# Patient Record
Sex: Male | Born: 1995 | Race: Black or African American | Hispanic: No | Marital: Single | Smoking: Never smoker
Health system: Southern US, Community
[De-identification: ages and names within clinical notes are randomized; demographics above are authoritative.]

## PROBLEM LIST (undated history)

## (undated) DIAGNOSIS — D571 Sickle-cell disease without crisis: Secondary | ICD-10-CM

## (undated) HISTORY — PX: WISDOM TOOTH EXTRACTION: SHX21

---
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
## 2014-03-24 ENCOUNTER — Encounter (HOSPITAL_COMMUNITY): Payer: Self-pay | Admitting: Emergency Medicine

## 2014-03-24 ENCOUNTER — Emergency Department (HOSPITAL_COMMUNITY): Payer: BC Managed Care – PPO

## 2014-03-24 ENCOUNTER — Emergency Department (HOSPITAL_COMMUNITY)
Admission: EM | Admit: 2014-03-24 | Discharge: 2014-03-24 | Disposition: A | Discharge status: Home / Self Care | Payer: BC Managed Care – PPO | Attending: Emergency Medicine | Admitting: Emergency Medicine

## 2014-03-24 DIAGNOSIS — D5700 Hb-SS disease with crisis, unspecified: Secondary | ICD-10-CM | POA: Insufficient documentation

## 2014-03-24 DIAGNOSIS — J3489 Other specified disorders of nose and nasal sinuses: Secondary | ICD-10-CM | POA: Diagnosis present

## 2014-03-24 DIAGNOSIS — J069 Acute upper respiratory infection, unspecified: Secondary | ICD-10-CM | POA: Diagnosis not present

## 2014-03-24 LAB — COMPREHENSIVE METABOLIC PANEL
ALT: 33 U/L (ref 0–53)
AST: 45 U/L — ABNORMAL HIGH (ref 0–37)
Albumin: 4.2 g/dL (ref 3.5–5.2)
Alkaline Phosphatase: 97 U/L (ref 39–117)
Anion gap: 13 (ref 5–15)
BILIRUBIN TOTAL: 3.7 mg/dL — AB (ref 0.3–1.2)
BUN: 5 mg/dL — ABNORMAL LOW (ref 6–23)
CHLORIDE: 101 meq/L (ref 96–112)
CO2: 24 meq/L (ref 19–32)
CREATININE: 0.61 mg/dL (ref 0.50–1.35)
Calcium: 9.1 mg/dL (ref 8.4–10.5)
Glucose, Bld: 103 mg/dL — ABNORMAL HIGH (ref 70–99)
Potassium: 4 mEq/L (ref 3.7–5.3)
Sodium: 138 mEq/L (ref 137–147)
Total Protein: 7.3 g/dL (ref 6.0–8.3)

## 2014-03-24 LAB — CBC WITH DIFFERENTIAL/PLATELET
Basophils Absolute: 0 10*3/uL (ref 0.0–0.1)
Basophils Relative: 0 % (ref 0–1)
EOS PCT: 4 % (ref 0–5)
Eosinophils Absolute: 0.7 10*3/uL (ref 0.0–0.7)
HEMATOCRIT: 20.7 % — AB (ref 39.0–52.0)
HEMOGLOBIN: 7.3 g/dL — AB (ref 13.0–17.0)
LYMPHS ABS: 1.2 10*3/uL (ref 0.7–4.0)
Lymphocytes Relative: 7 % — ABNORMAL LOW (ref 12–46)
MCH: 28.3 pg (ref 26.0–34.0)
MCHC: 35.3 g/dL (ref 30.0–36.0)
MCV: 80.2 fL (ref 78.0–100.0)
MONO ABS: 1.6 10*3/uL — AB (ref 0.1–1.0)
MONOS PCT: 9 % (ref 3–12)
Neutro Abs: 14.1 10*3/uL — ABNORMAL HIGH (ref 1.7–7.7)
Neutrophils Relative %: 80 % — ABNORMAL HIGH (ref 43–77)
Platelets: 475 10*3/uL — ABNORMAL HIGH (ref 150–400)
RBC: 2.58 MIL/uL — AB (ref 4.22–5.81)
RDW: 26.2 % — ABNORMAL HIGH (ref 11.5–15.5)
WBC: 17.6 10*3/uL — ABNORMAL HIGH (ref 4.0–10.5)

## 2014-03-24 LAB — RETICULOCYTES
RBC.: 2.58 MIL/uL — AB (ref 4.22–5.81)
RETIC CT PCT: 19.5 % — AB (ref 0.4–3.1)
Retic Count, Absolute: 503.1 10*3/uL — ABNORMAL HIGH (ref 19.0–186.0)

## 2014-03-24 MED ORDER — SODIUM CHLORIDE 0.9 % IV BOLUS (SEPSIS)
1000.0000 mL | Freq: Once | INTRAVENOUS | Status: AC
  Administered 2014-03-24: 1000 mL via INTRAVENOUS

## 2014-03-24 MED ORDER — OXYMETAZOLINE HCL 0.05 % NA SOLN
1.0000 | Freq: Once | NASAL | Status: AC
  Administered 2014-03-24: 1 via NASAL
  Filled 2014-03-24: qty 15

## 2014-03-24 MED ORDER — OXYCODONE-ACETAMINOPHEN 5-325 MG PO TABS: 2.0000 | ORAL_TABLET | ORAL | Status: DC | PRN

## 2014-03-24 MED ORDER — HYDROMORPHONE HCL 1 MG/ML IJ SOLN
1.0000 mg | Freq: Once | INTRAMUSCULAR | Status: AC
  Administered 2014-03-24: 1 mg via INTRAVENOUS
  Filled 2014-03-24: qty 1

## 2014-03-24 NOTE — ED Notes (Signed)
Pt states he developed a cold on Friday with nasal congestion, sinus pressure and generalized weakness. States last night he started to experience sickle cell pain in left shoulder and lower back. States he took 10 mg oxycodone and ibuprofen with no relief. Pt denies any CP, denies SOB. NAD.

## 2014-03-24 NOTE — ED Provider Notes (Signed)
CSN: [id number]     Arrival date & time 03/24/14  1315 History   First MD Initiated Contact with Patient 03/24/14 1347     Chief Complaint  Patient presents with  . Sickle Cell Pain Crisis  . Nasal Congestion     (Consider location/radiation/quality/duration/timing/severity/associated sxs/prior Treatment) HPI Matthew Case is a 18 y.o. male with PMH of sickle cell disease presenting with 3 day history of sinus congestion and pressure and last night developed left shoulder and right sided low back pain. Pain is sharp and pain is typical of sickle cell crises. Patient took 10 mg oxycodone and ibuprofen without relief yesterday. Pain is worse with movement. Last hospitalization for sickle cell was in July. Patient is afebrile in the ED today. He has had a productive cough with yellow thin mucous. Patient denies chest pain, SOB, difficulty breathing.   Past Medical History  Diagnosis Date  . Sickle cell disease    Past Surgical History  Procedure Laterality Date  . Cholecystectomy N/A 07/27/2013    Procedure: LAPAROSCOPIC CHOLECYSTECTOMY ;  Surgeon: Shelly Rubenstein, MD;  Location: [facility] OR;  Service: General;  Laterality: N/A;   Family History  Problem Relation Age of Onset  . Diabetes Maternal Grandmother   . Hypertension Maternal Grandmother   . Hypertension Maternal Grandfather   . Diabetes Maternal Grandfather    History  Substance Use Topics  . Smoking status: Never Smoker   . Smokeless tobacco: Never Used  . Alcohol Use: No    Review of Systems  Constitutional: Negative for fever and chills.  HENT: Positive for congestion and rhinorrhea.   Eyes: Negative for visual disturbance.  Respiratory: Positive for cough. Negative for shortness of breath.   Cardiovascular: Negative for chest pain and palpitations.  Gastrointestinal: Negative for nausea, vomiting and diarrhea.  Genitourinary: Negative for dysuria and hematuria.  Musculoskeletal: Positive for back pain. Negative for  gait problem.  Skin: Negative for rash.  Neurological: Negative for weakness and headaches.      Allergies  Review of patient's allergies indicates no known allergies.  Home Medications   Prior to Admission medications   Medication Sig Start Date End Date Taking? Authorizing Provider  ibuprofen (ADVIL,MOTRIN) 600 MG tablet Take 600 mg by mouth every 6 (six) hours as needed for mild pain.    Yes Historical Provider, MD  Oxycodone HCl 10 MG TABS Take 1 tablet (10 mg total) by mouth 4 (four) times daily as needed (moderate or severe pain). 09/27/13  Yes Flint Melter, MD  oxyCODONE-acetaminophen (PERCOCET/ROXICET) 5-325 MG per tablet Take 2 tablets by mouth every 4 (four) hours as needed for moderate pain or severe pain. 03/24/14   Benetta Spar L Maury Bamba, PA-C   BP 122/65  Pulse 90  Temp(Src) 98.6 F (37 C) (Oral)  Resp 19  Ht 6' (1.829 m)  Wt 136 lb (61.689 kg)  BMI 18.44 kg/m2  SpO2 92% Physical Exam  Nursing note and vitals reviewed. Constitutional: He appears well-developed and well-nourished. No distress.  HENT:  Head: Normocephalic and atraumatic.  Mouth/Throat: Mucous membranes are normal. Posterior oropharyngeal edema and posterior oropharyngeal erythema present. No oropharyngeal exudate.  Eyes: Conjunctivae and EOM are normal. Right eye exhibits no discharge. Left eye exhibits no discharge.  Cardiovascular: Normal rate, regular rhythm and normal heart sounds.   Pulmonary/Chest: Effort normal and breath sounds normal. No respiratory distress. He has no wheezes.  Abdominal: Soft. Bowel sounds are normal. He exhibits no distension. There is no tenderness.  Musculoskeletal:  FROM of right shoulder with mild tenderness. Neurovascularly intact.  No midline back tenderness, step off or crepitus. Right  sided lower back tenderness. No CVA tenderness. No point tenderness in remainder of arms or legs.   Neurological: He is alert. He exhibits normal muscle tone. Coordination normal.   Equal muscle tone. 5/5 strength in lower extremities.  Normal gait.   Skin: Skin is warm and dry. He is not diaphoretic.    ED Course  Procedures (including critical care time) Labs Review Labs Reviewed  CBC WITH DIFFERENTIAL - Abnormal; Notable for the following:    WBC 17.6 (*)    RBC 2.58 (*)    Hemoglobin 7.3 (*)    HCT 20.7 (*)    RDW 26.2 (*)    Platelets 475 (*)    Neutrophils Relative % 80 (*)    Lymphocytes Relative 7 (*)    Neutro Abs 14.1 (*)    Monocytes Absolute 1.6 (*)    All other components within normal limits  COMPREHENSIVE METABOLIC PANEL - Abnormal; Notable for the following:    Glucose, Bld 103 (*)    BUN 5 (*)    AST 45 (*)    Total Bilirubin 3.7 (*)    All other components within normal limits  RETICULOCYTES - Abnormal; Notable for the following:    Retic Ct Pct 19.5 (*)    RBC. 2.58 (*)    Retic Count, Manual 503.1 (*)    All other components within normal limits    Imaging Review Dg Chest 2 View  03/24/2014   CLINICAL DATA:  Cough, congestion, sickle cell pain  EXAM: CHEST  2 VIEW  COMPARISON:  09/27/2013  FINDINGS: Lungs are clear.  No pleural effusion or pneumothorax.  The heart is top-normal in size.  Thoracolumbar spine is notable for superior/inferior endplate vertebral body endplate changes compatible with sickle cell disease.  Cholecystectomy clips.  IMPRESSION: No evidence of acute cardiopulmonary disease.   Electronically Signed   By: Charline Bills M.D.   On: 03/24/2014 15:42     EKG Interpretation None     Meds given in ED:  Medications  HYDROmorphone (DILAUDID) injection 1 mg (1 mg Intravenous Given 03/24/14 1439)  sodium chloride 0.9 % bolus 1,000 mL (0 mLs Intravenous Stopped 03/24/14 1630)  HYDROmorphone (DILAUDID) injection 1 mg (1 mg Intravenous Given 03/24/14 1627)  sodium chloride 0.9 % bolus 1,000 mL (0 mLs Intravenous Stopped 03/24/14 1707)  oxymetazoline (AFRIN) 0.05 % nasal spray 1 spray (1 spray Each Nare Given  03/24/14 1651)    Discharge Medication List as of 03/24/2014  4:46 PM    START taking these medications   Details  oxyCODONE-acetaminophen (PERCOCET/ROXICET) 5-325 MG per tablet Take 2 tablets by mouth every 4 (four) hours as needed for moderate pain or severe pain., Starting 03/24/2014, Until Discontinued, Print          MDM   Final diagnoses:  Sickle cell crisis  URI (upper respiratory infection)   Patient with sickle cell disease presents with typical symptoms of pain crisis unrelieved by home pain meds. Pain in low right back and left shoulder tenderness. No chest pain or shortness of breath. Patient with productive cough. Patient with symptoms of URI. Afebrile with VSS. Patient anemic but at baseline. Elevated reticulocytes. CXR without acute changes. Given 2 bolus of fluids. Pain improved to 5/10 in ED with pain medication. Patient is afebrile, nontoxic, and in no acute distress. Patient is appropriate for outpatient management and is stable  for discharge. Script for pain medication provided. Sedation and driving precautions provided. Symptomatic control for URI symptoms and hydration. Afrin for congestion but not to use more than 3 days to prevent rebound congestion. Follow up with PCP within one week. Pt to return with fevers, CP, SOB, fevers, worsening cough and uncontrollable pain.   Discussed return precautions with patient. Discussed all results and patient verbalizes understanding and agrees with plan.  This is a shared patient. This patient was discussed with the physician, Dr. Effie Shy who saw and evaluated the patient and agrees with the plan.     Louann Sjogren, PA-C 03/24/14 2027

## 2014-03-24 NOTE — Discharge Instructions (Signed)
Return to the emergency room with worsening of symptoms, new symptoms or with symptoms that are concerning, especially fevers, chest pain, cough, nausea, vomiting or severe pain. OTC cold medications, mucinex, afrin. Do not use afrin for more than 3 days. It will cause rebound congestion. Ibuprofen and tylenol for aches and pains.  Pain meds as needed. Do not operate machinery, drive or drink alcohol while taking narcotics or muscle relaxers. Please call your doctor for a followup appointment within 24-48 hours. When you talk to your doctor please let them know that you were seen in the emergency department and have them acquire all of your records so that they can discuss the findings with you and formulate a treatment plan to fully care for your new and ongoing problems.

## 2014-03-24 NOTE — ED Provider Notes (Signed)
  Face-to-face evaluation   History: He complains of back pain and left shoulder pain. Several days ago. Not amenable to treatment, at home. She chest pain  Physical exam: Calm, cooperative. He has clear nasal discharge.  Medical screening examination/treatment/procedure(s) were conducted as a shared visit with non-physician practitioner(s) and myself.  I personally evaluated the patient during the encounter  Flint Melter, MD 03/27/14 [phone]

## 2014-03-24 NOTE — ED Notes (Signed)
Pt from home for eval of arm pain and lower back pain, pt denies any sob  Or cp, pt also reports that he has oxydocone 10 mg prescriptions but is running low on medications and is [place] pcp is. nad noted. Axo x4.

## 2014-03-30 ENCOUNTER — Encounter (HOSPITAL_COMMUNITY): Payer: Self-pay | Admitting: Emergency Medicine

## 2014-03-30 ENCOUNTER — Inpatient Hospital Stay (HOSPITAL_COMMUNITY)
Admission: EM | Admit: 2014-03-30 | Discharge: 2014-04-05 | DRG: 812 | Disposition: A | Discharge status: Home / Self Care | Payer: BC Managed Care – PPO | Attending: Internal Medicine | Admitting: Internal Medicine

## 2014-03-30 ENCOUNTER — Emergency Department (HOSPITAL_COMMUNITY): Payer: BC Managed Care – PPO

## 2014-03-30 DIAGNOSIS — Z791 Long term (current) use of non-steroidal anti-inflammatories (NSAID): Secondary | ICD-10-CM

## 2014-03-30 DIAGNOSIS — D5701 Hb-SS disease with acute chest syndrome: Secondary | ICD-10-CM

## 2014-03-30 DIAGNOSIS — E860 Dehydration: Secondary | ICD-10-CM | POA: Diagnosis present

## 2014-03-30 DIAGNOSIS — Z8249 Family history of ischemic heart disease and other diseases of the circulatory system: Secondary | ICD-10-CM

## 2014-03-30 DIAGNOSIS — D72829 Elevated white blood cell count, unspecified: Secondary | ICD-10-CM | POA: Diagnosis present

## 2014-03-30 DIAGNOSIS — D649 Anemia, unspecified: Secondary | ICD-10-CM

## 2014-03-30 DIAGNOSIS — D5700 Hb-SS disease with crisis, unspecified: Secondary | ICD-10-CM | POA: Diagnosis not present

## 2014-03-30 DIAGNOSIS — Z833 Family history of diabetes mellitus: Secondary | ICD-10-CM

## 2014-03-30 DIAGNOSIS — J069 Acute upper respiratory infection, unspecified: Secondary | ICD-10-CM | POA: Diagnosis present

## 2014-03-30 DIAGNOSIS — D571 Sickle-cell disease without crisis: Secondary | ICD-10-CM | POA: Diagnosis present

## 2014-03-30 DIAGNOSIS — Z9049 Acquired absence of other specified parts of digestive tract: Secondary | ICD-10-CM | POA: Diagnosis present

## 2014-03-30 DIAGNOSIS — Z23 Encounter for immunization: Secondary | ICD-10-CM

## 2014-03-30 DIAGNOSIS — Z79891 Long term (current) use of opiate analgesic: Secondary | ICD-10-CM

## 2014-03-30 DIAGNOSIS — M545 Low back pain: Secondary | ICD-10-CM | POA: Diagnosis not present

## 2014-03-30 LAB — CBC WITH DIFFERENTIAL/PLATELET
Basophils Absolute: 0.1 10*3/uL (ref 0.0–0.1)
Basophils Relative: 1 % (ref 0–1)
EOS ABS: 0.8 10*3/uL — AB (ref 0.0–0.7)
Eosinophils Relative: 6 % — ABNORMAL HIGH (ref 0–5)
HCT: 19.9 % — ABNORMAL LOW (ref 39.0–52.0)
HEMOGLOBIN: 7.1 g/dL — AB (ref 13.0–17.0)
Lymphocytes Relative: 38 % (ref 12–46)
Lymphs Abs: 5.4 10*3/uL — ABNORMAL HIGH (ref 0.7–4.0)
MCH: 29.7 pg (ref 26.0–34.0)
MCHC: 35.7 g/dL (ref 30.0–36.0)
MCV: 83.3 fL (ref 78.0–100.0)
MONO ABS: 2 10*3/uL — AB (ref 0.1–1.0)
Monocytes Relative: 14 % — ABNORMAL HIGH (ref 3–12)
NEUTROS PCT: 41 % — AB (ref 43–77)
Neutro Abs: 5.8 10*3/uL (ref 1.7–7.7)
Platelets: 490 10*3/uL — ABNORMAL HIGH (ref 150–400)
RBC: 2.39 MIL/uL — ABNORMAL LOW (ref 4.22–5.81)
RDW: 26.5 % — ABNORMAL HIGH (ref 11.5–15.5)
WBC: 14.1 10*3/uL — ABNORMAL HIGH (ref 4.0–10.5)

## 2014-03-30 LAB — URINE MICROSCOPIC-ADD ON

## 2014-03-30 LAB — COMPREHENSIVE METABOLIC PANEL
ALT: 17 U/L (ref 0–53)
ANION GAP: 16 — AB (ref 5–15)
AST: 35 U/L (ref 0–37)
Albumin: 4.2 g/dL (ref 3.5–5.2)
Alkaline Phosphatase: 94 U/L (ref 39–117)
BILIRUBIN TOTAL: 2.8 mg/dL — AB (ref 0.3–1.2)
BUN: 5 mg/dL — ABNORMAL LOW (ref 6–23)
CALCIUM: 9 mg/dL (ref 8.4–10.5)
CHLORIDE: 103 meq/L (ref 96–112)
CO2: 22 mEq/L (ref 19–32)
CREATININE: 0.61 mg/dL (ref 0.50–1.35)
GLUCOSE: 87 mg/dL (ref 70–99)
Potassium: 4.2 mEq/L (ref 3.7–5.3)
Sodium: 141 mEq/L (ref 137–147)
Total Protein: 7.9 g/dL (ref 6.0–8.3)

## 2014-03-30 LAB — RETICULOCYTES
RBC.: 2.39 MIL/uL — AB (ref 4.22–5.81)
RETIC CT PCT: 21.3 % — AB (ref 0.4–3.1)
Retic Count, Absolute: 509.1 10*3/uL — ABNORMAL HIGH (ref 19.0–186.0)

## 2014-03-30 LAB — RAPID URINE DRUG SCREEN, HOSP PERFORMED
AMPHETAMINES: NOT DETECTED
BARBITURATES: NOT DETECTED
Benzodiazepines: NOT DETECTED
Cocaine: NOT DETECTED
OPIATES: POSITIVE — AB
Tetrahydrocannabinol: POSITIVE — AB

## 2014-03-30 LAB — URINALYSIS, ROUTINE W REFLEX MICROSCOPIC
Bilirubin Urine: NEGATIVE
GLUCOSE, UA: NEGATIVE mg/dL
KETONES UR: NEGATIVE mg/dL
Leukocytes, UA: NEGATIVE
Nitrite: NEGATIVE
Protein, ur: NEGATIVE mg/dL
Specific Gravity, Urine: 1.012 (ref 1.005–1.030)
Urobilinogen, UA: 0.2 mg/dL (ref 0.0–1.0)
pH: 6 (ref 5.0–8.0)

## 2014-03-30 MED ORDER — DIPHENHYDRAMINE HCL 12.5 MG/5ML PO ELIX
12.5000 mg | ORAL_SOLUTION | Freq: Four times a day (QID) | ORAL | Status: DC | PRN
  Filled 2014-03-30: qty 5

## 2014-03-30 MED ORDER — ONDANSETRON HCL 4 MG/2ML IJ SOLN: 4.0000 mg | Freq: Four times a day (QID) | INTRAMUSCULAR | Status: DC | PRN

## 2014-03-30 MED ORDER — ADULT MULTIVITAMIN W/MINERALS CH
1.0000 | ORAL_TABLET | Freq: Every day | ORAL | Status: DC
  Administered 2014-03-31 – 2014-04-05 (×6): 1 via ORAL
  Filled 2014-03-30 (×6): qty 1

## 2014-03-30 MED ORDER — SODIUM CHLORIDE 0.9 % IV BOLUS (SEPSIS)
1000.0000 mL | Freq: Once | INTRAVENOUS | Status: AC
  Administered 2014-03-30: 1000 mL via INTRAVENOUS

## 2014-03-30 MED ORDER — HYDROMORPHONE HCL 1 MG/ML IJ SOLN
2.0000 mg | Freq: Once | INTRAMUSCULAR | Status: AC
  Administered 2014-03-30: 2 mg via INTRAVENOUS
  Filled 2014-03-30: qty 2

## 2014-03-30 MED ORDER — NALOXONE HCL 0.4 MG/ML IJ SOLN: 0.4000 mg | INTRAMUSCULAR | Status: DC | PRN

## 2014-03-30 MED ORDER — KETOROLAC TROMETHAMINE 15 MG/ML IJ SOLN
15.0000 mg | Freq: Four times a day (QID) | INTRAMUSCULAR | Status: AC
  Administered 2014-03-31 – 2014-04-04 (×19): 15 mg via INTRAVENOUS
  Filled 2014-03-30 (×22): qty 1

## 2014-03-30 MED ORDER — ENOXAPARIN SODIUM 40 MG/0.4ML ~~LOC~~ SOLN: 40.0000 mg | SUBCUTANEOUS | Status: DC

## 2014-03-30 MED ORDER — PNEUMOCOCCAL VAC POLYVALENT 25 MCG/0.5ML IJ INJ
0.5000 mL | INJECTION | INTRAMUSCULAR | Status: AC
  Administered 2014-04-04: 0.5 mL via INTRAMUSCULAR
  Filled 2014-03-30 (×2): qty 0.5

## 2014-03-30 MED ORDER — ONDANSETRON HCL 4 MG PO TABS: 4.0000 mg | ORAL_TABLET | ORAL | Status: DC | PRN

## 2014-03-30 MED ORDER — SODIUM CHLORIDE 0.9 % IV BOLUS (SEPSIS): 1000.0000 mL | Freq: Once | INTRAVENOUS | Status: DC

## 2014-03-30 MED ORDER — HYDROMORPHONE HCL 1 MG/ML IJ SOLN
1.0000 mg | Freq: Once | INTRAMUSCULAR | Status: AC
  Administered 2014-03-30: 1 mg via INTRAVENOUS
  Filled 2014-03-30: qty 1

## 2014-03-30 MED ORDER — KETOROLAC TROMETHAMINE 30 MG/ML IJ SOLN
30.0000 mg | Freq: Once | INTRAMUSCULAR | Status: AC
  Administered 2014-03-30: 30 mg via INTRAVENOUS
  Filled 2014-03-30: qty 1

## 2014-03-30 MED ORDER — FOLIC ACID 1 MG PO TABS
1.0000 mg | ORAL_TABLET | Freq: Every day | ORAL | Status: DC
  Administered 2014-03-31 – 2014-04-05 (×6): 1 mg via ORAL
  Filled 2014-03-30 (×6): qty 1

## 2014-03-30 MED ORDER — DIPHENHYDRAMINE HCL 50 MG/ML IJ SOLN: 12.5000 mg | Freq: Four times a day (QID) | INTRAMUSCULAR | Status: DC | PRN

## 2014-03-30 MED ORDER — HYDROMORPHONE 0.3 MG/ML IV SOLN
INTRAVENOUS | Status: DC
  Administered 2014-03-30: 0.3 mg via INTRAVENOUS
  Administered 2014-03-31: 5 mg via INTRAVENOUS
  Filled 2014-03-30: qty 25

## 2014-03-30 MED ORDER — ONDANSETRON HCL 4 MG/2ML IJ SOLN
4.0000 mg | Freq: Once | INTRAMUSCULAR | Status: AC
  Administered 2014-03-30: 4 mg via INTRAVENOUS
  Filled 2014-03-30: qty 2

## 2014-03-30 MED ORDER — SODIUM CHLORIDE 0.9 % IJ SOLN: 9.0000 mL | INTRAMUSCULAR | Status: DC | PRN

## 2014-03-30 MED ORDER — DOCUSATE SODIUM 100 MG PO CAPS
100.0000 mg | ORAL_CAPSULE | Freq: Two times a day (BID) | ORAL | Status: DC
  Administered 2014-03-30 – 2014-04-04 (×6): 100 mg via ORAL
  Filled 2014-03-30 (×14): qty 1

## 2014-03-30 MED ORDER — HEPARIN SODIUM (PORCINE) 5000 UNIT/ML IJ SOLN
5000.0000 [IU] | Freq: Three times a day (TID) | INTRAMUSCULAR | Status: DC
  Filled 2014-03-30 (×9): qty 1

## 2014-03-30 MED ORDER — SODIUM CHLORIDE 0.9 % IV SOLN
INTRAVENOUS | Status: DC
  Administered 2014-03-30 – 2014-04-02 (×7): via INTRAVENOUS

## 2014-03-30 MED ORDER — INFLUENZA VAC SPLIT QUAD 0.5 ML IM SUSY
0.5000 mL | PREFILLED_SYRINGE | INTRAMUSCULAR | Status: AC
  Administered 2014-04-04: 0.5 mL via INTRAMUSCULAR
  Filled 2014-03-30 (×2): qty 0.5

## 2014-03-30 MED ORDER — LEVOFLOXACIN 750 MG PO TABS
750.0000 mg | ORAL_TABLET | Freq: Every day | ORAL | Status: DC
  Administered 2014-03-31: 750 mg via ORAL
  Filled 2014-03-30: qty 1

## 2014-03-30 MED ORDER — HYDROMORPHONE HCL 1 MG/ML IJ SOLN
1.0000 mg | INTRAMUSCULAR | Status: DC | PRN
  Administered 2014-03-30 (×2): 1 mg via INTRAVENOUS
  Filled 2014-03-30: qty 2
  Filled 2014-03-30 (×2): qty 1

## 2014-03-30 MED ORDER — HYDROMORPHONE HCL 1 MG/ML IJ SOLN
2.0000 mg | Freq: Once | INTRAMUSCULAR | Status: AC
  Administered 2014-03-30: 2 mg via INTRAVENOUS

## 2014-03-30 MED ORDER — ONDANSETRON HCL 4 MG/2ML IJ SOLN: 4.0000 mg | INTRAMUSCULAR | Status: DC | PRN

## 2014-03-30 NOTE — ED Provider Notes (Addendum)
CSN: [id number]     Arrival date & time 03/30/14  1191 History   First MD Initiated Contact with Patient 03/30/14 [phone]     Chief Complaint  Patient presents with  . Sickle Cell Pain Crisis     (Consider location/radiation/quality/duration/timing/severity/associated sxs/prior Treatment) HPI Comments: Patient presents to the ER for evaluation of low back pain. Patient reports severe pain in his lower back that is consistent with sickle cell crisis. He took oxycodone an hour ago without any improvement. The patient does not have any chest pain or shortness of breath. He has not had any fever, chills, nausea, vomiting. Pain is constant and worsens with movement. Patient denies falls and injuries.  Patient is a 18 y.o. male presenting with sickle cell pain.  Sickle Cell Pain Crisis Associated symptoms: no chest pain, no cough, no fever and no shortness of breath     Past Medical History  Diagnosis Date  . Sickle cell disease    Past Surgical History  Procedure Laterality Date  . Cholecystectomy N/A 07/27/2013    Procedure: LAPAROSCOPIC CHOLECYSTECTOMY ;  Surgeon: Shelly Rubenstein, MD;  Location: [facility] OR;  Service: General;  Laterality: N/A;   Family History  Problem Relation Age of Onset  . Diabetes Maternal Grandmother   . Hypertension Maternal Grandmother   . Hypertension Maternal Grandfather   . Diabetes Maternal Grandfather    History  Substance Use Topics  . Smoking status: Never Smoker   . Smokeless tobacco: Never Used  . Alcohol Use: No    Review of Systems  Constitutional: Negative for fever.  Respiratory: Negative for cough and shortness of breath.   Cardiovascular: Negative for chest pain.  Musculoskeletal: Positive for back pain.  All other systems reviewed and are negative.     Allergies  Review of patient's allergies indicates no known allergies.  Home Medications   Prior to Admission medications   Medication Sig Start Date End Date Taking? Authorizing  Provider  ibuprofen (ADVIL,MOTRIN) 600 MG tablet Take 600 mg by mouth every 6 (six) hours as needed for mild pain.    Yes Historical Provider, MD  Oxycodone HCl 10 MG TABS Take 1 tablet (10 mg total) by mouth 4 (four) times daily as needed (moderate or severe pain). 09/27/13  Yes Flint Melter, MD  oxyCODONE-acetaminophen (PERCOCET/ROXICET) 5-325 MG per tablet Take 2 tablets by mouth every 4 (four) hours as needed for moderate pain or severe pain. 03/24/14  Yes Benetta Spar L Creech, PA-C   BP 106/61  Pulse 60  Temp(Src) 97.5 F (36.4 C) (Oral)  Resp 12  Ht 6' (1.829 m)  Wt 130 lb (58.968 kg)  BMI 17.63 kg/m2  SpO2 97% Physical Exam  Constitutional: He is oriented to person, place, and time. He appears well-developed and well-nourished. He appears distressed (sobbing).  HENT:  Head: Normocephalic and atraumatic.  Right Ear: Hearing normal.  Left Ear: Hearing normal.  Nose: Nose normal.  Mouth/Throat: Oropharynx is clear and moist and mucous membranes are normal.  Eyes: Conjunctivae and EOM are normal. Pupils are equal, round, and reactive to light.  Neck: Normal range of motion. Neck supple.  Cardiovascular: Regular rhythm, S1 normal and S2 normal.  Exam reveals no gallop and no friction rub.   No murmur heard. Pulmonary/Chest: Effort normal and breath sounds normal. No respiratory distress. He exhibits no tenderness.  Abdominal: Soft. Normal appearance and bowel sounds are normal. There is no hepatosplenomegaly. There is no tenderness. There is no rebound, no guarding, no  tenderness at McBurney's point and negative Murphy's sign. No hernia.  Musculoskeletal: Normal range of motion.  Neurological: He is alert and oriented to person, place, and time. He has normal strength. No cranial nerve deficit or sensory deficit. Coordination normal. GCS eye subscore is 4. GCS verbal subscore is 5. GCS motor subscore is 6.  Skin: Skin is warm, dry and intact. No rash noted. No cyanosis.  Psychiatric:  He has a normal mood and affect. His speech is normal and behavior is normal. Thought content normal.    ED Course  Procedures (including critical care time) Labs Review Labs Reviewed  CBC WITH DIFFERENTIAL - Abnormal; Notable for the following:    WBC 14.1 (*)    RBC 2.39 (*)    Hemoglobin 7.1 (*)    HCT 19.9 (*)    RDW 26.5 (*)    Platelets 490 (*)    Neutrophils Relative % 41 (*)    Monocytes Relative 14 (*)    Eosinophils Relative 6 (*)    Lymphs Abs 5.4 (*)    Monocytes Absolute 2.0 (*)    Eosinophils Absolute 0.8 (*)    All other components within normal limits  COMPREHENSIVE METABOLIC PANEL - Abnormal; Notable for the following:    BUN 5 (*)    Total Bilirubin 2.8 (*)    Anion gap 16 (*)    All other components within normal limits  RETICULOCYTES - Abnormal; Notable for the following:    Retic Ct Pct 21.3 (*)    RBC. 2.39 (*)    Retic Count, Manual 509.1 (*)    All other components within normal limits  URINALYSIS, ROUTINE W REFLEX MICROSCOPIC - Abnormal; Notable for the following:    Color, Urine AMBER (*)    APPearance CLOUDY (*)    Hgb urine dipstick TRACE (*)    All other components within normal limits  URINE RAPID DRUG SCREEN (HOSP PERFORMED) - Abnormal; Notable for the following:    Opiates POSITIVE (*)    Tetrahydrocannabinol POSITIVE (*)    All other components within normal limits  URINE MICROSCOPIC-ADD ON    Imaging Review Dg Chest 2 View  03/30/2014   CLINICAL DATA:  Back pain, sickle cell crisis  EXAM: CHEST - 2 VIEW  COMPARISON:  03/24/2014  FINDINGS: Stable cardiomegaly without current CHF or focal pneumonia. Mild central bronchitic change. Negative for edema, effusion or pneumothorax. Trachea midline. Chronic vertebral changes of sickle cell disease on the lateral view.  IMPRESSION: Cardiomegaly and chronic bronchitic change. No superimposed acute process.   Electronically Signed   By: Ruel Favors M.D.   On: 03/30/2014 10:08     EKG  Interpretation None      MDM   Final diagnoses:  None  sickle cell crisis   Patient presents to the ER for evaluation of sickle cell crisis. Patient reports that symptoms began 2 or 3 hours before coming to the ER. He has severe low back pain which is consistent with previous crisis. No evidence of acute chest, no pulmonary symptoms. Patient's workup has been unremarkable. Patient has had significant improvement with treatment. Pain was initially severe, now 5 or 6/10.   Patient initially indicated that he might be able to go home and manage his pain with his oral meds. He wanted to attempt discharge, but prior to discharge he started having increased pain and therefore admission will be requested.  Gilda Crease, MD 03/30/14 1253  Gilda Crease, MD 03/30/14 [phone]

## 2014-03-30 NOTE — ED Notes (Signed)
Oxycodone taken 1 hour ago

## 2014-03-30 NOTE — H&P (Signed)
Date: 03/30/2014               Patient Name:  Matthew Case MRN: [id number]  DOB: Mar 17, 1996 Age / Sex: 18 y.o., male   PCP: Pcp Not In System         Medical Service: Internal Medicine Teaching Service         Attending Physician: Dr. Burns Spain, MD    First Contact: Dr. Eleonore Chiquito Pager: [phone]  Second Contact: Dr. Charlsie Merles Pager: [phone]       After Hours (After 5p/  First Contact Pager: [phone]  weekends / holidays): Second Contact Pager: [phone]   Chief Complaint: "pain crisis" that returned this morning  History of Present Illness: Matthew Case is an 18 yo man with SS disease (baseline hgb 7-8, acute chest syndrome in 04/2013), cholecystitis s/p cholecystectomy (07/2013) here for evaluation of his lower back and b/l forearm pain that started 6 days ago, subsided, then came back this morning. One week ago, he first noted an upper respiratory illness (coughing, production of some sputum, congestion, no fever). The pain set in after he had been sick for a few days; he believes he was and is dehydrated. He visited the ED at that time, was afebrile, received IV hydration and pain control, felt better, and was discharged home.  This morning, the pain returned. To him, it feels like pain crises he has had in the past. He took oxycodone at home, which did not relieve his pain. He denies shortness of breath, chest pain, or pain anywhere aside from his lower back and b/l forearms. He has not had chest pain, shortness of breath, fevers, chills, nausea, vomiting, numbness, tingling, light-headedness or trauma.  On this visit to the ED he has been afebrile, he is satting 100% on room air, and his CXR was negative for focal infiltrate; he received ketorolac (30 mg) and morphine x 4 (6 mg total) and 2L IV fluids.  Meds: No current facility-administered medications for this encounter.   Current Outpatient Prescriptions  Medication Sig Dispense Refill  . ibuprofen (ADVIL,MOTRIN)  600 MG tablet Take 600 mg by mouth every 6 (six) hours as needed for mild pain.       . Oxycodone HCl 10 MG TABS Take 1 tablet (10 mg total) by mouth 4 (four) times daily as needed (moderate or severe pain).  30 tablet  0  . oxyCODONE-acetaminophen (PERCOCET/ROXICET) 5-325 MG per tablet Take 2 tablets by mouth every 4 (four) hours as needed for moderate pain or severe pain.  30 tablet  0   He has taken hydroxyurea in the past (09/2013)  Allergies: Allergies as of 03/30/2014  . (No Known Allergies)   Past Medical History  Diagnosis Date  . Sickle cell disease    Past Surgical History  Procedure Laterality Date  . Cholecystectomy N/A 07/27/2013    Procedure: LAPAROSCOPIC CHOLECYSTECTOMY ;  Surgeon: Shelly Rubenstein, MD;  Location: [facility] OR;  Service: General;  Laterality: N/A;   Family History  Problem Relation Age of Onset  . Diabetes Maternal Grandmother   . Hypertension Maternal Grandmother   . Hypertension Maternal Grandfather   . Diabetes Maternal Grandfather    History   Social History  . Marital Status: Single    Spouse Name: N/A    Number of Children: N/A  . Years of Education: N/A   Occupational History  . Not on file.   Social History Main Topics  . Smoking status: Never Smoker   .  Smokeless tobacco: Never Used  . Alcohol Use: No  . Drug Use: No  . Sexual Activity: Yes    Birth Control/ Protection: Condom   Other Topics Concern  . Not on file   Social History Narrative  . No narrative on file    Review of Systems: Pertinent items are noted in HPI.  Physical Exam: Blood pressure 141/90, pulse 74, temperature 97.5 F (36.4 C), temperature source Oral, resp. rate 14, height 6' (1.829 m), weight 130 lb (58.968 kg), SpO2 100.00%. Appearance: lying in bed watching a movie on iphone, in NAD HEENT: AT/[place], PERRL, EOMi, MMM Heart: RRR, normal S1S2, no chest wall tenderness Lungs: faint crackles at bases b/l, otherwise CTAB Abdomen: BS+, soft, nontender,  patient was tense on exam, but no appreciable splenomegaly Musculoskeletal: normal range of motion, pain on sitting up  Extremities: pain on moving arms, no edema b/l Neurologic: A&Ox3, strength and sensation grossly intact Skin: no rashes or lesions  Lab results: Basic Metabolic Panel:  Recent Labs  16/10/96 0651  NA 141  K 4.2  CL 103  CO2 22  GLUCOSE 87  BUN 5*  CREATININE 0.61  CALCIUM 9.0   Liver Function Tests:  Recent Labs  03/30/14 0651  AST 35  ALT 17  ALKPHOS 94  BILITOT 2.8*  PROT 7.9  ALBUMIN 4.2   CBC:  Recent Labs  03/30/14 0651  WBC 14.1*  NEUTROABS 5.8  HGB 7.1*  HCT 19.9*  MCV 83.3  PLT 490*   Anemia Panel:  Recent Labs  03/30/14 0651  RETICCTPCT 21.3*   Urine Drug Screen: Drugs of Abuse     Component Value Date/Time   LABOPIA POSITIVE* 03/30/2014 1057   COCAINSCRNUR NONE DETECTED 03/30/2014 1057   LABBENZ NONE DETECTED 03/30/2014 1057   AMPHETMU NONE DETECTED 03/30/2014 1057   THCU POSITIVE* 03/30/2014 1057   LABBARB NONE DETECTED 03/30/2014 1057    Urinalysis:  Recent Labs  03/30/14 1057  COLORURINE AMBER*  LABSPEC 1.012  PHURINE 6.0  GLUCOSEU NEGATIVE  HGBUR TRACE*  BILIRUBINUR NEGATIVE  KETONESUR NEGATIVE  PROTEINUR NEGATIVE  UROBILINOGEN 0.2  NITRITE NEGATIVE  LEUKOCYTESUR NEGATIVE    Imaging results:  Dg Chest 2 View  03/30/2014   CLINICAL DATA:  Back pain, sickle cell crisis  EXAM: CHEST - 2 VIEW  COMPARISON:  03/24/2014  FINDINGS: Stable cardiomegaly without current CHF or focal pneumonia. Mild central bronchitic change. Negative for edema, effusion or pneumothorax. Trachea midline. Chronic vertebral changes of sickle cell disease on the lateral view.  IMPRESSION: Cardiomegaly and chronic bronchitic change. No superimposed acute process.   Electronically Signed   By: Ruel Favors M.D.   On: 03/30/2014 10:08   Assessment & Plan by Problem: Active Problems:   * No active hospital problems. *  Matthew Case is an 18  yo man with SS disease who is presenting with lower back and b/l forearm pain consistent with prior pain crises in the context of a URI. He has been satting at 100% on room air, denies chest pain or shortness of breath, has a leukocytosis to 14.1 (down from 17.6 at his ED visit last week) and admits to feeling dehydrated. His hemoglobin is 7.1 (baseline 7-8) and his reticulocyte count is elevated at 509.1 (highest in our record).  Sickle Cell Pain Crisis: patient is likely experiencing a sickle cell pain crisis secondary to dehydration and infection from his URI. He has had multiple pain crises in the past and does not have a hematologist  in [place]. He was previously taking hydroxyurea, but stopped because it made him feel fatigued. Pain - Pain control with dilaudid and toradol until able to tolerate the pain with oral medications - Incentive spirometry - Pain control with Dilaudid q2hrs 1-2 mg PRN and Toradol 15 mg IV q6 hours - Colace - K-pad to affected areas for pain control - O2 by [place] Dehydration - IVF NS at 200 mL/hour plus 1L bolus - Encourage PO liquids Anemia - Trend hemoglobin - Folic acid and multivitamin (for vitamin D and magnesium) - Type & screen in case transfusion is indicated by a drop in hgb - Outpatient hematology follow-up  Upper Respiratory Illness: patient has been experiencing cough, sputum production and congestion for a week with an elevated but down-trending white count. - Trend WBC - Start levaquin 750 mg for prophylactic coverage in context of this being a SCD patient - F/u blood, urine, sputum cultures in context of this being a SCD patient - Duonebs  Diet:  - Regular  DVT Ppx:  - [place] heparin  Dispo: Disposition is deferred at this time, awaiting improvement of current medical problems. Anticipated discharge in approximately 2 day(s).   The patient does not have a current PCP (Pcp Not In System) and does not need an [facility] hospital follow-up appointment after  discharge.  The patient does not have transportation limitations that hinder transportation to clinic appointments.  Signed: Dionne Ano, MD 03/30/2014, 2:33 PM

## 2014-03-30 NOTE — Progress Notes (Signed)
03/30/14 Patient came into Emergency room transferred to Room 5W 37 with Dx of Sickle Cell Crisis. Has been yelling and hollering in pain Dilaudid given on arrival,IV fluids started, assessment done and Admission completed, Ordered K-pad and applied to back area.

## 2014-03-30 NOTE — ED Notes (Signed)
Pt states "I peed before I came here, I can't pee now"

## 2014-03-30 NOTE — ED Notes (Signed)
In to discharge patient, states he wants to be admitted. "I'm still in crisis". MD made aware.

## 2014-03-30 NOTE — Discharge Instructions (Signed)
Sickle Cell Anemia, Adult °Sickle cell anemia is a condition in which red blood cells have an abnormal "sickle" shape. This abnormal shape shortens the cells' life span, which results in a lower than normal concentration of red blood cells in the blood. The sickle shape also causes the cells to clump together and block free blood flow through the blood vessels. As a result, the tissues and organs of the body do not receive enough oxygen. Sickle cell anemia causes organ damage and pain and increases the risk of infection. °CAUSES  °Sickle cell anemia is a genetic disorder. Those who receive two copies of the gene have the condition, and those who receive one copy have the trait. °RISK FACTORS °The sickle cell gene is most common in people whose families originated in [place]. Other areas of the globe where sickle cell trait occurs include the Mediterranean, [place] and [place], the [place], and the [place].  °SIGNS AND SYMPTOMS °· Pain, especially in the extremities, back, chest, or abdomen (common). The pain may start suddenly or may develop following an illness, especially if there is dehydration. Pain can also occur due to overexertion or exposure to extreme temperature changes. °· Frequent severe bacterial infections, especially certain types of pneumonia and meningitis. °· Pain and swelling in the hands and feet. °· Decreased activity.   °· Loss of appetite.   °· Change in behavior. °· Headaches. °· Seizures. °· Shortness of breath or difficulty breathing. °· Vision changes. °· Skin ulcers. °Those with the trait may not have symptoms or they may have mild symptoms.  °DIAGNOSIS  °Sickle cell anemia is diagnosed with blood tests that demonstrate the genetic trait. It is often diagnosed during the newborn period, due to mandatory testing nationwide. A variety of blood tests, X-rays, CT scans, MRI scans, ultrasounds, and lung function tests may also be done to monitor the condition. °TREATMENT  °Sickle  cell anemia may be treated with: °· Medicines. You may be given pain medicines, antibiotic medicines (to treat and prevent infections) or medicines to increase the production of certain types of hemoglobin. °· Fluids. °· Oxygen. °· Blood transfusions. °HOME CARE INSTRUCTIONS  °· Drink enough fluid to keep your urine clear or pale yellow. Increase your fluid intake in hot weather and during exercise. °· Do not smoke. Smoking lowers oxygen levels in the blood.   °· Only take over-the-counter or prescription medicines for pain, fever, or discomfort as directed by your health care provider. °· Take antibiotics as directed by your health care provider. Make sure you finish them it even if you start to feel better.   °· Take supplements as directed by your health care provider.   °· Consider wearing a medical alert bracelet. This tells anyone caring for you in an emergency of your condition.   °· When traveling, keep your medical information, health care provider's names, and the medicines you take with you at all times.   °· If you develop a fever, do not take medicines to reduce the fever right away. This could cover up a problem that is developing. Notify your health care provider. °· Keep all follow-up appointments with your health care provider. Sickle cell anemia requires regular medical care. °SEEK MEDICAL CARE IF: ° You have a fever. °SEEK IMMEDIATE MEDICAL CARE IF:  °· You feel dizzy or faint.   °· You have new abdominal pain, especially on the left side near the stomach area.   °· You develop a persistent, often uncomfortable and painful penile erection (priapism). If this is not treated immediately it   will lead to impotence.   °· You have numbness your arms or legs or you have a hard time moving them.   °· You have a hard time with speech.   °· You have a fever or persistent symptoms for more than 2-3 days.   °· You have a fever and your symptoms suddenly get worse.   °· You have signs or symptoms of infection.  These include:   °¨ Chills.   °¨ Abnormal tiredness (lethargy).   °¨ Irritability.   °¨ Poor eating.   °¨ Vomiting.   °· You develop pain that is not helped with medicine.   °· You develop shortness of breath. °· You have pain in your chest.   °· You are coughing up pus-like or bloody sputum.   °· You develop a stiff neck. °· Your feet or hands swell or have pain. °· Your abdomen appears bloated. °· You develop joint pain. °MAKE SURE YOU: °· Understand these instructions. °Document Released: 09/28/2005 Document Revised: 11/04/2013 Document Reviewed: 01/30/2013 °ExitCare® Patient Information ©2015 [employer]. This information is not intended to replace advice given to you by your health care provider. Make sure you discuss any questions you have with your health care provider. ° °

## 2014-03-30 NOTE — ED Notes (Signed)
Pt is sleeping while side lying. In NAD. VSS

## 2014-03-30 NOTE — ED Notes (Addendum)
Pt refusing chest xray. Dr. Blinda Leatherwood made aware, reports pt cannot have anymore pains if no xray. Pt made and aware and reports he is willing to have xray. Radiology made aware pt is ready for transport.

## 2014-03-30 NOTE — ED Notes (Signed)
The pt is c/o back pain for 2-3 hours.  He has sickle cell crisis.  No hickman or porta-cath

## 2014-03-31 DIAGNOSIS — Z833 Family history of diabetes mellitus: Secondary | ICD-10-CM | POA: Diagnosis not present

## 2014-03-31 DIAGNOSIS — Z9049 Acquired absence of other specified parts of digestive tract: Secondary | ICD-10-CM | POA: Diagnosis present

## 2014-03-31 DIAGNOSIS — M545 Low back pain: Secondary | ICD-10-CM | POA: Diagnosis present

## 2014-03-31 DIAGNOSIS — Z791 Long term (current) use of non-steroidal anti-inflammatories (NSAID): Secondary | ICD-10-CM | POA: Diagnosis not present

## 2014-03-31 DIAGNOSIS — D5700 Hb-SS disease with crisis, unspecified: Secondary | ICD-10-CM | POA: Diagnosis present

## 2014-03-31 DIAGNOSIS — D72829 Elevated white blood cell count, unspecified: Secondary | ICD-10-CM | POA: Diagnosis present

## 2014-03-31 DIAGNOSIS — Z8249 Family history of ischemic heart disease and other diseases of the circulatory system: Secondary | ICD-10-CM | POA: Diagnosis not present

## 2014-03-31 DIAGNOSIS — Z79891 Long term (current) use of opiate analgesic: Secondary | ICD-10-CM | POA: Diagnosis not present

## 2014-03-31 DIAGNOSIS — J069 Acute upper respiratory infection, unspecified: Secondary | ICD-10-CM | POA: Diagnosis present

## 2014-03-31 DIAGNOSIS — E860 Dehydration: Secondary | ICD-10-CM | POA: Diagnosis not present

## 2014-03-31 DIAGNOSIS — Z23 Encounter for immunization: Secondary | ICD-10-CM | POA: Diagnosis not present

## 2014-03-31 LAB — TYPE AND SCREEN
ABO/RH(D): O POS
Antibody Screen: NEGATIVE

## 2014-03-31 LAB — MAGNESIUM: Magnesium: 1.6 mg/dL (ref 1.5–2.5)

## 2014-03-31 MED ORDER — HYDROMORPHONE HCL 1 MG/ML IJ SOLN
1.0000 mg | INTRAMUSCULAR | Status: DC | PRN
  Administered 2014-03-31 (×2): 1 mg via INTRAVENOUS
  Filled 2014-03-31 (×2): qty 1

## 2014-03-31 MED ORDER — HYDROMORPHONE HCL 1 MG/ML IJ SOLN
2.0000 mg | Freq: Once | INTRAMUSCULAR | Status: AC
  Administered 2014-03-31: 2 mg via INTRAVENOUS
  Filled 2014-03-31: qty 2

## 2014-03-31 MED ORDER — HYDROMORPHONE HCL 1 MG/ML IJ SOLN
1.0000 mg | INTRAMUSCULAR | Status: DC | PRN
  Administered 2014-03-31 – 2014-04-01 (×7): 2 mg via INTRAVENOUS
  Filled 2014-03-31 (×7): qty 2

## 2014-03-31 MED ORDER — HYDROMORPHONE 0.3 MG/ML IV SOLN: INTRAVENOUS | Status: DC

## 2014-03-31 MED ORDER — MAGNESIUM SULFATE 40 MG/ML IJ SOLN
2.0000 g | Freq: Once | INTRAMUSCULAR | Status: AC
  Administered 2014-03-31: 2 g via INTRAVENOUS
  Filled 2014-03-31: qty 50

## 2014-03-31 MED ORDER — HYDROMORPHONE HCL 1 MG/ML IJ SOLN
1.0000 mg | INTRAMUSCULAR | Status: DC | PRN
  Administered 2014-03-31 (×2): 1 mg via INTRAVENOUS
  Filled 2014-03-31: qty 2

## 2014-03-31 MED ORDER — HYDROMORPHONE 0.3 MG/ML IV SOLN
INTRAVENOUS | Status: DC
  Administered 2014-03-31 (×2): via INTRAVENOUS
  Administered 2014-03-31: 3.2 mg via INTRAVENOUS
  Administered 2014-03-31: 5.49 mg via INTRAVENOUS
  Filled 2014-03-31 (×2): qty 25

## 2014-03-31 NOTE — Progress Notes (Addendum)
Pt. Removed his tele. & refused to have it back.Dr.Moding  made aware.

## 2014-03-31 NOTE — Progress Notes (Signed)
Patient refused lab draw again per lab tech.  Told that the lab will probably be canceled since it was the third attempt.  Informed Dr. Leatha Gilding who felt that it was okay to wait for now.  Will continue to monitor.

## 2014-03-31 NOTE — Progress Notes (Signed)
UR completed 

## 2014-03-31 NOTE — Progress Notes (Signed)
Patient refusing telemetry for the last 24 hours.  Will discontinue order.

## 2014-03-31 NOTE — Progress Notes (Signed)
  Date: 03/31/2014  Patient name: Matthew Case  Medical record number: [id number]  Date of birth: 27-Mar-1996   I have seen and evaluated Matthew Case and discussed their care with the Residency Team. [name] is an 18 yo man from [place] attending school here in [place]. He receives care from the [facility] and was last seen Aug 2015. He has SS disease (baseline hgb 7-8, acute chest syndrome in 04/2013), AVN L hip, cholecystitis s/p cholecystectomy (07/2013) here for evaluation of his lower back and b/l forearm pain that started 6 days ago, subsided, then came back this morning. One week ago, he first noted an upper respiratory illness (coughing, production of some sputum, congestion, no fever). The pain set in after he had been sick for a few days; he believes he was and is dehydrated. He visited the ED at that time, was afebrile, received IV hydration and pain control, felt better, and was discharged home.   The morning of admit the pain returned. To him, it feels like pain crises he has had in the past. He took oxycodone at home, which did not relieve his pain. He denies shortness of breath, chest pain, or pain anywhere aside from his lower back and b/l forearms. He has not had chest pain, shortness of breath, fevers, chills, nausea, vomiting, numbness, tingling, light-headedness or trauma.  In the ED he was afebrile, satting 100% on room air, and his CXR was negative for focal infiltrate; he received ketorolac (30 mg) and morphine x 4 (6 mg total) and 2L IV fluids. Overnight, he was transitioned to hydromorphone PCA. This AM he states the bolus opioids worked better than the PCA. The pain is unchanged. His appetite is OK and he is drinking plenty of fluids. He is receptive to est SS care here in [place] and considering [facility] as an outpt but not now.  On exam, he is not overly sedated and is plesant, alert and talkative. HRRR. Lungs hard to hear as pt is not taking deep inspirations. ABD benign. Ext no  edema.   Assessment and Plan: I have seen and evaluated the patient as outlined above. I agree with the formulated Assessment and Plan as detailed in the residents' admission note, with the following changes:   1. Sickle cell pain crisis - There appears to be no infectious cause for the crisis - CXR, UA are nl; no fever' leukocytosis trending down. Therefore, will stop ABX and follow. Provide opioid pain control, O2, IVF. Pt declined HU. Arrange outpt F/U in the Sickle cell clinic at [facility].  2. LVH on EKG - Pt had ECHO at [facility]. Situs solitus of atria and viscera. Normally related great vessels. Informal consult to cards if this would explain LVH.   Burns Spain, MD 9/28/20153:16 PM

## 2014-03-31 NOTE — Progress Notes (Signed)
Subjective: Matthew Case feels better this morning. States that his lower back pain is resolved, but his arm pain, now focused on his elbow joints in both arms, persists. He does not feel that the PCA is giving him enough pain control. His cold symptoms have gotten better.   When asked about why he stopped taking his hydroxyurea, he says it made him feel weak and "brought on crises". He prefers to wait to re-try it until he is an outpatient. He is interested in attending a [facility]CD Clinic in [place].  Objective: Vital signs in last 24 hours: Filed Vitals:   03/31/14 0440 03/31/14 0631 03/31/14 0806 03/31/14 0951  BP: 128/74 135/85    Pulse: 62 62    Temp: 98.8 F (37.1 C) 98.2 F (36.8 C)    TempSrc: Oral Oral    Resp: Height:      Weight:      SpO2: 100% 100% 96% 98%   Weight change: 10 lb 11.2 oz (4.853 kg)  Intake/Output Summary (Last 24 hours) at 03/31/14 1044 Last data filed at 03/31/14 1020  Gross per 24 hour  Intake 2910.01 ml  Output   3150 ml  Net -239.99 ml   Physical Exam: Appearance: lying in bed looking at phone, in NAD  HEENT: AT/[place], PERRL, EOMi, MMM  Heart: RRR, normal S1S2, no chest wall tenderness  Lungs: CTAB, normal work of breathing  Abdomen: BS+, soft, nontender, no appreciable splenomegaly  Musculoskeletal: normal range of motion, no pain on sitting up today Extremities: pain on moving arms, pain to palpation of elbow joints, no edema b/l  Neurologic: A&Ox3, strength and sensation grossly intact  Skin: no rashes or lesions   Lab Results: Basic Metabolic Panel:  Recent Labs Lab 03/24/14 1420 03/30/14 0651 03/30/14 2309  NA 138 141  --   K 4.0 4.2  --   CL 101 103  --   CO2 24 22  --   GLUCOSE 103* 87  --   BUN 5* 5*  --   CREATININE 0.61 0.61  --   CALCIUM 9.1 9.0  --   MG  --   --  1.6   Liver Function Tests:  Recent Labs Lab 03/24/14 1420 03/30/14 0651  AST 45* 35  ALT 33 17  ALKPHOS 97 94  BILITOT 3.7* 2.8*  PROT  7.3 7.9  ALBUMIN 4.2 4.2  CBC:  Recent Labs Lab 03/24/14 1420 03/30/14 0651  WBC 17.6* 14.1*  NEUTROABS 14.1* 5.8  HGB 7.3* 7.1*  HCT 20.7* 19.9*  MCV 80.2 83.3  PLT 475* 490*   Anemia Panel:  Recent Labs Lab 03/30/14 0651  RETICCTPCT 21.3*   Urine Drug Screen: Drugs of Abuse     Component Value Date/Time   LABOPIA POSITIVE* 03/30/2014 1057   COCAINSCRNUR NONE DETECTED 03/30/2014 1057   LABBENZ NONE DETECTED 03/30/2014 1057   AMPHETMU NONE DETECTED 03/30/2014 1057   THCU POSITIVE* 03/30/2014 1057   LABBARB NONE DETECTED 03/30/2014 1057    Urinalysis:  Recent Labs Lab 03/30/14 1057  COLORURINE AMBER*  LABSPEC 1.012  PHURINE 6.0  GLUCOSEU NEGATIVE  HGBUR TRACE*  BILIRUBINUR NEGATIVE  KETONESUR NEGATIVE  PROTEINUR NEGATIVE  UROBILINOGEN 0.2  NITRITE NEGATIVE  LEUKOCYTESUR NEGATIVE   Studies/Results: Dg Chest 2 View  03/30/2014   CLINICAL DATA:  Back pain, sickle cell crisis  EXAM: CHEST - 2 VIEW  COMPARISON:  03/24/2014  FINDINGS: Stable cardiomegaly without current CHF or focal pneumonia. Mild central  bronchitic change. Negative for edema, effusion or pneumothorax. Trachea midline. Chronic vertebral changes of sickle cell disease on the lateral view.  IMPRESSION: Cardiomegaly and chronic bronchitic change. No superimposed acute process.   Electronically Signed   By: Ruel Favors M.D.   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 Normally related great vessels. 61.6% LVEF. CONCLUSIONS: Normal cardiac structure and function  Medications: I have reviewed the patient's current medications. Scheduled Meds: . docusate sodium  100 mg Oral BID  . folic acid  1 mg Oral Daily  . heparin subcutaneous  5,000 Units Subcutaneous 3 times per day  . Influenza vac split quadrivalent PF  0.5 mL Intramuscular Tomorrow-1000  . ketorolac  15 mg Intravenous 4 times per day  . multivitamin with minerals  1 tablet  Oral Daily  . pneumococcal 23 valent vaccine  0.5 mL Intramuscular Tomorrow-1000  . sodium chloride  1,000 mL Intravenous Once   Continuous Infusions: . sodium chloride 200 mL/hr at 03/31/14 0519   PRN Meds:.HYDROmorphone (DILAUDID) injection, ondansetron Assessment/Plan: Active Problems:   Sickle cell disease, type SS   Sickle cell crisis Matthew Case is an 18 yo man with SS disease who is presenting with lower back and b/l forearm pain consistent with prior pain crises in the context of a URI. He has been satting at 100% on room air, denies chest pain or shortness of breath, has a leukocytosis to 14.1 (down from 17.6 at his ED visit last week) and admits to feeling dehydrated. His hemoglobin is 7.1 (baseline 7-8) and his reticulocyte count is elevated at 509.1 (highest in our record). He states that he is still in 9/10 pain, but appears much more comfortable on exam today. He is requesting a switch back to q2hr pain medication instead of his PCA. He is refusing lab draws.  Sickle Cell Pain Crisis: patient is likely experiencing a sickle cell pain crisis secondary to dehydration and infection from his URI. He has had multiple pain crises in the past and does not have a hematologist in [place]; he is interested in initiating outpatient care here. Pain  - Pain control switched from PCA to IV dilaudid 1-2 mg q2 hours PRN and toradol 15 mg IV q6 hours --> plan to switch to PO medications as tolerated - Incentive spirometry  - Colace  - K-pad to affected areas for pain control  - O2 by [place]  Dehydration  - IVF NS at 200 mL/hour plus 1L bolus - Encourage PO liquids  Anemia  - Trend hemoglobin; patient currently refusing labs - Folic acid and multivitamin (for vitamin D and magnesium)  - Type & screen in case transfusion is indicated by a drop in hgb; will not be tracking if patient is refusing labs; transfusion not indicated while patient is at his baseline hgb  - Set up outpatient hematology follow-up     Upper Respiratory Illness: patient experienced cough, sputum production and congestion for a week with an elevated but down-trending white count. His symptoms are much better today. - Trend WBC; patient currently refusing labs - Stop levaquin as patient's URI symptoms seem to be resolving(initiated for prophylactic coverage in context of this being a SCD patient)  - Considered repeat CXR now that patient has been hydrated, but symptoms are much improved today, so hold for now - F/u blood, urine, sputum cultures in context of this being a SCD patient  - Duonebs   Diet:  -  Regular   DVT Ppx:  -  heparin  Dispo: Disposition is deferred at this time, awaiting improvement of current medical problems.  Anticipated discharge in approximately 1-2 day(s).   The patient does not have a current PCP (Pcp Not In System) and does not need an [facility] hospital follow-up appointment after discharge.  The patient does not have transportation limitations that hinder transportation to clinic appointments.  .Services Needed at time of discharge: Y = Yes, Blank = No PT:   OT:   RN:   Equipment:   Other:     LOS: 1 day   Dionne Ano, MD 03/31/2014, 10:44 AM

## 2014-03-31 NOTE — Progress Notes (Signed)
Pt continues to remove oxygen sensor from nose causing the PCA to lockout. Pt states "this medicine is not working". Pt told by multiple staff to keep in oxygen sensor. Explained to patient why the PCA locked out and why it is important to keep the oxygen senor on.

## 2014-03-31 NOTE — Progress Notes (Signed)
Pt.is refusing blood drawn this am.Dr.Hoffman made aware.

## 2014-04-01 ENCOUNTER — Inpatient Hospital Stay (HOSPITAL_COMMUNITY): Payer: BC Managed Care – PPO

## 2014-04-01 DIAGNOSIS — D5700 Hb-SS disease with crisis, unspecified: Secondary | ICD-10-CM

## 2014-04-01 DIAGNOSIS — J069 Acute upper respiratory infection, unspecified: Secondary | ICD-10-CM

## 2014-04-01 LAB — URINE CULTURE
COLONY COUNT: NO GROWTH
CULTURE: NO GROWTH

## 2014-04-01 LAB — CBC
HCT: 16.8 % — ABNORMAL LOW (ref 39.0–52.0)
Hemoglobin: 5.8 g/dL — CL (ref 13.0–17.0)
MCH: 27.4 pg (ref 26.0–34.0)
MCHC: 34.5 g/dL (ref 30.0–36.0)
MCV: 79.2 fL (ref 78.0–100.0)
PLATELETS: 413 10*3/uL — AB (ref 150–400)
RBC: 2.12 MIL/uL — ABNORMAL LOW (ref 4.22–5.81)
RDW: 23.9 % — AB (ref 11.5–15.5)
WBC: 15.8 10*3/uL — ABNORMAL HIGH (ref 4.0–10.5)

## 2014-04-01 LAB — PREPARE RBC (CROSSMATCH)

## 2014-04-01 LAB — LACTATE DEHYDROGENASE: LDH: 669 U/L — ABNORMAL HIGH (ref 94–250)

## 2014-04-01 LAB — RETICULOCYTES
RBC.: 2.13 MIL/uL — ABNORMAL LOW (ref 4.22–5.81)
RETIC CT PCT: 19.5 % — AB (ref 0.4–3.1)
Retic Count, Absolute: 415.4 10*3/uL — ABNORMAL HIGH (ref 19.0–186.0)

## 2014-04-01 MED ORDER — ONDANSETRON HCL 4 MG/2ML IJ SOLN: 4.0000 mg | Freq: Four times a day (QID) | INTRAMUSCULAR | Status: DC | PRN

## 2014-04-01 MED ORDER — DIPHENHYDRAMINE HCL 50 MG/ML IJ SOLN: 12.5000 mg | Freq: Four times a day (QID) | INTRAMUSCULAR | Status: DC | PRN

## 2014-04-01 MED ORDER — HYDROMORPHONE HCL 1 MG/ML IJ SOLN
2.0000 mg | Freq: Once | INTRAMUSCULAR | Status: AC
  Administered 2014-04-01: 2 mg via INTRAVENOUS
  Filled 2014-04-01: qty 2

## 2014-04-01 MED ORDER — NALOXONE HCL 0.4 MG/ML IJ SOLN: 0.4000 mg | INTRAMUSCULAR | Status: DC | PRN

## 2014-04-01 MED ORDER — HYDROMORPHONE HCL 1 MG/ML IJ SOLN
1.0000 mg | INTRAMUSCULAR | Status: DC | PRN
  Administered 2014-04-01 – 2014-04-04 (×18): 1 mg via INTRAVENOUS
  Filled 2014-04-01 (×18): qty 1

## 2014-04-01 MED ORDER — DEXTROSE 5 % IV SOLN
2.0000 g | INTRAVENOUS | Status: DC
  Administered 2014-04-01 – 2014-04-04 (×4): 2 g via INTRAVENOUS
  Filled 2014-04-01 (×4): qty 2

## 2014-04-01 MED ORDER — SODIUM CHLORIDE 0.9 % IJ SOLN: 9.0000 mL | INTRAMUSCULAR | Status: DC | PRN

## 2014-04-01 MED ORDER — SODIUM CHLORIDE 0.9 % IV SOLN: Freq: Once | INTRAVENOUS | Status: DC

## 2014-04-01 MED ORDER — HYDROMORPHONE 0.3 MG/ML IV SOLN
INTRAVENOUS | Status: DC
  Administered 2014-04-01: 7.8 mg via INTRAVENOUS
  Administered 2014-04-01: 16:00:00 via INTRAVENOUS
  Filled 2014-04-01: qty 25

## 2014-04-01 MED ORDER — ACETAMINOPHEN 325 MG PO TABS
650.0000 mg | ORAL_TABLET | Freq: Once | ORAL | Status: AC
  Administered 2014-04-01: 650 mg via ORAL
  Filled 2014-04-01: qty 2

## 2014-04-01 MED ORDER — DIPHENHYDRAMINE HCL 12.5 MG/5ML PO ELIX
12.5000 mg | ORAL_SOLUTION | Freq: Four times a day (QID) | ORAL | Status: DC | PRN
  Filled 2014-04-01: qty 5

## 2014-04-01 NOTE — Progress Notes (Signed)
Pt refused to go to xray, states he wants to wait until next dose of pain med is due, pt aware that transport may or may not be able to return at his requested time.

## 2014-04-01 NOTE — Progress Notes (Signed)
Report called to [facility].

## 2014-04-01 NOTE — Progress Notes (Signed)
CRITICAL VALUE ALERT  Critical value received:  Hemoglobin 5.8  Date of notification:  04/01/14  Time of notification:  1515  Critical value read back:Yes.    Nurse who received alert:  Rosalie DoctorLisa Aeriel Boulay RN   MD notified (1st page):  Dr. Sherrine MaplesGlenn  Time of first page:  1515  MD notified (2nd page):  Time of second page:  Responding MD:  Dr. Sherrine MaplesGlenn  Time MD responded:  [phone]

## 2014-04-01 NOTE — Progress Notes (Signed)
TRANSFER NOTE:   After discussion with Dr. Ashley RoyaltyMatthews, [facility] be [facility] for SCD Crisis management.   Vitals: BP 132/67 P 84 T 98.5 (Tmax 101.6 early this morning) RR 17 O2 93%  PE:  Appearance: lying in bed watching cartoons with headphones in ears, in NAD  HEENT: AT/[place], PERRL, EOMi, MMM  Heart: RRR, normal S1S2, no chest wall tenderness  Lungs: CTAB, normal work of breathing  Abdomen: BS+, soft, nontender, no appreciable splenomegaly  Musculoskeletal: normal range of motion, no pain on sitting up  Extremities: pain on moving right arm (grabs his arm to move it), pain to palpation of shoulder, no LE edema b/l  Neurologic: A&Ox3, CN II-XII intact, coordination intact on FNF, strength and sensation grossly intact  Skin: no rashes or lesions   Hb today 5.8<--7.1. His baseline is 7-8 WBC: 15.8<--14.1 LDH 669 Reticulocyte count on admission: 415.4  CXR today showed no acute cardiopulmonary disease. Also s/f stigmata of SCD involving thoracic and lumbar vertebral bodies and conspicuous absence of splenic shadow.  Blood cultures are pending x2  One dose of ceftriaxone was initiated this morning in context of patient's febrile episode  Patient's pain is decreased from admission; management was changed from 2 mg dilaudid q2hours to PCA with 0.6 mg bolus (15 minute intervals) this afternoon.  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 Normally related great vessels. 61.6% LVEF. CONCLUSIONS: Normal cardiac structure and function  Medications: I have reviewed the patient's current medications. Scheduled Meds: . sodium chloride   Intravenous Once  . cefTRIAXone (ROCEPHIN)  IV  2 g Intravenous Q24H  . docusate sodium  100 mg Oral BID  . folic acid  1 mg Oral Daily  . heparin subcutaneous  5,000 Units Subcutaneous 3 times per day  . HYDROmorphone PCA 0.3 mg/mL   Intravenous 6 times per day   . Influenza vac split quadrivalent PF  0.5 mL Intramuscular Tomorrow-1000  . ketorolac  15 mg Intravenous 4 times per day  . multivitamin with minerals  1 tablet Oral Daily  . pneumococcal 23 valent vaccine  0.5 mL Intramuscular Tomorrow-1000  . sodium chloride  1,000 mL Intravenous Once   Continuous Infusions: . 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 His pain has now localized to his right arm. He has been satting at 92-100% on room air throughout his hospitalization, denies chest pain or shortness of breath, has a leukocytosis to 15.8 (down from 17.6 at his ED visit last week) and admits to feeling dehydrated. His hemoglobin is 5.8 (baseline 7-8) and his reticulocyte count is elevated at 509.1 (highest in our record) with LDH 669. He states that he is occasionally still in 9/10 pain, now back on PCA, but appears much more comfortable on exam with pain now localized to his right shoulder.  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

## 2014-04-01 NOTE — Progress Notes (Signed)
Subjective: Mr. Ogborn feels "fine" this morning. States that his pain is now localized to his right arm (mostly in the shoulder). He does complain of pain up to 9/10 in intensity. He is preferring the dilaudid q2 hours to the PCA that he was given on the night of admission. He denies chest pain, shortness of breath, numbness, tingling, confusion, neck stiffness or any other new symptoms.  Objective: Vital signs in last 24 hours: Filed Vitals:   03/31/14 1341 03/31/14 2109 04/01/14 0541 04/01/14 0928  BP: 135/87 134/80 141/86 139/75  Pulse: 78 75 100 89  Temp: 99.3 F (37.4 C) 99 F (37.2 C) 101.6 F (38.7 C) 98.8 F (37.1 C)  TempSrc: Oral Oral Oral Oral  Resp: 20 20 20 20   Height:      Weight:      SpO2: 98% 98% 100% 100%   Weight change:   Intake/Output Summary (Last 24 hours) at 04/01/14 1328 Last data filed at 04/01/14 0900  Gross per 24 hour  Intake    180 ml  Output   2900 ml  Net  -2720 ml   Physical Exam: Appearance: lying in bed watching cartoons with headphones in ears, in NAD  HEENT: AT/[place], PERRL, EOMi, MMM  Heart: RRR, normal S1S2, no chest wall tenderness  Lungs: CTAB, normal work of breathing  Abdomen: BS+, soft, nontender, no appreciable splenomegaly  Musculoskeletal: normal range of motion, no pain on sitting up Extremities: pain on moving right arm (grabs his arm to move it), pain to palpation of shoulder, no LE edema b/l  Neurologic: A&Ox3, CN II-XII intact, coordination intact on FNF, strength and sensation grossly intact  Skin: no rashes or lesions   Lab Results: Basic Metabolic Panel:  Recent Labs Lab 03/30/14 0651 03/30/14 2309  NA 141  --   K 4.2  --   CL 103  --   CO2 22  --   GLUCOSE 87  --   BUN 5*  --   CREATININE 0.61  --   CALCIUM 9.0  --   MG  --  1.6   Liver Function Tests:  Recent Labs Lab 03/30/14 0651  AST 35  ALT 17  ALKPHOS 94  BILITOT 2.8*  PROT 7.9  ALBUMIN 4.2  CBC:  Recent Labs Lab 03/30/14 0651  WBC  14.1*  NEUTROABS 5.8  HGB 7.1*  HCT 19.9*  MCV 83.3  PLT 490*   Anemia Panel:  Recent Labs Lab 03/30/14 0651  RETICCTPCT 21.3*   Urine Drug Screen: Drugs of Abuse     Component Value Date/Time   LABOPIA POSITIVE* 03/30/2014 1057   COCAINSCRNUR NONE DETECTED 03/30/2014 1057   LABBENZ NONE DETECTED 03/30/2014 1057   AMPHETMU NONE DETECTED 03/30/2014 1057   THCU POSITIVE* 03/30/2014 1057   LABBARB NONE DETECTED 03/30/2014 1057    Urinalysis:  Recent Labs Lab 03/30/14 1057  COLORURINE AMBER*  LABSPEC 1.012  PHURINE 6.0  GLUCOSEU NEGATIVE  HGBUR TRACE*  BILIRUBINUR NEGATIVE  KETONESUR NEGATIVE  PROTEINUR NEGATIVE  UROBILINOGEN 0.2  NITRITE NEGATIVE  LEUKOCYTESUR NEGATIVE   Studies/Results: Dg Chest 2 View  04/01/2014   CLINICAL DATA:  History of sickle cell disease, now with fever  EXAM: CHEST  2 VIEW  COMPARISON:  03/30/2014; 03/24/2014; 07/24/2013  FINDINGS: Grossly unchanged borderline enlarged cardiac silhouette and mediastinal contours. There is unchanged mild diffuse slightly nodular thickening of the pulmonary station. There is minimal pleural parenchymal thickening about the bilateral minor fissures. No discrete focal airspace opacities.  No pleural effusion or pneumothorax. No evidence of edema. Stigmata of sickle cell disease involving the thoracic and lumbar vertebral bodies. Post cholecystectomy. There is conspicuous absence of the splenic shadow. Regional soft tissues appear normal.  IMPRESSION: No acute cardiopulmonary disease. Specifically, no evidence of pneumonia.   Electronically Signed   By: Simonne ComeJohn  Watts M.D.   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 Normally related great vessels. 61.6% LVEF. CONCLUSIONS: Normal cardiac structure and function  Medications: I have reviewed the patient's current medications. Scheduled Meds: . cefTRIAXone (ROCEPHIN)  IV  2 g Intravenous Q24H  . docusate  sodium  100 mg Oral BID  . folic acid  1 mg Oral Daily  . heparin subcutaneous  5,000 Units Subcutaneous 3 times per day  . Influenza vac split quadrivalent PF  0.5 mL Intramuscular Tomorrow-1000  . ketorolac  15 mg Intravenous 4 times per day  . multivitamin with minerals  1 tablet Oral Daily  . pneumococcal 23 valent vaccine  0.5 mL Intramuscular Tomorrow-1000  . sodium chloride  1,000 mL Intravenous Once   Continuous Infusions: . sodium chloride 200 mL/hr at 04/01/14 0321   PRN Meds:.HYDROmorphone (DILAUDID) injection, ondansetron Assessment/Plan: Active Problems:   Sickle cell disease, type SS   Sickle cell crisis 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 His pain has now localized to his right arm. He has been satting at 96-100% on room air throughout his hospitalization, denies chest pain or shortness of breath, has a leukocytosis to 14.1 (down from 17.6 at his ED visit last week - now refusing blood draws) and admits to feeling dehydrated. His hemoglobin is 7.1 (baseline 7-8 - now refusing blood draws) and his reticulocyte count is elevated at 509.1 (highest in our record). He states that he is occasionally still in 9/10 pain, but appears much more comfortable on exam today.   Sickle Cell Pain Crisis: patient is likely experiencing a sickle cell pain crisis secondary to dehydration and infection from his URI. He has had multiple pain crises in the past and does not have a hematologist in [place]; he is interested in initiating outpatient care here. Of note, patient spiked a fever to 101.6 overnight. Though fevers like this can be the result of the crisis itself, they are also very concerning in a patient with SCD (may have increased susceptibility to s pneumo, salmonella, e coli and s aureus). However, without any symptoms of SOB,  he is very unlikely to have acute chest. On exam, there are no meningeal signs, any signs of stroke or a palpable spleen. Discussed care with Dr. Ashley RoyaltyMatthews today; she offered that [facility]CD care. Will consider tomorrow, depending on patient's response to PCA. Fever - Initiated ceftriaxone 2 mg this am; will continue until blood cultures return - Repeat CXR was ordered (patient refused) - Will discuss importance of these tests with the patient Pain  - Pain has been controlled with IV dilaudid 1-2 mg q2 hours PRN and toradol 15 mg IV q6 hours --> plan to switch back to PCA as per Dr. Ashley RoyaltyMatthews. Will give 0.6 mg per bolus (0.01 mg / kg / dose) for better pain control - Will continue to  give 1 mg dilaudid q2 hours PRN - Will discuss with patient (as he does not like the PCA) - Incentive spirometry  - Colace  - K-pad to affected areas for pain control  - O2 by [place] (patient continually removes this) - Tele d/c (patient continually was removing it) Dehydration  - IVF NS at 200 mL/hour plus 1L bolus - Encourage PO liquids  Anemia  - Trend hemoglobin; patient currently refusing labs - Folic acid and multivitamin (for vitamin D and magnesium)  - Type & screen in case transfusion is indicated by a drop in hgb; will not be tracking if patient is refusing labs; transfusion not indicated while patient is at his baseline hgb  - Set up outpatient hematology follow-up   Upper Respiratory Illness: patient experienced cough, sputum production and congestion for a week with an elevated but down-trending white count. His URI symptoms continue to be much improved. However, he did spike a fever to 101.6 overnight. - Trend WBC; patient currently refusing labs - F/u blood, urine, sputum cultures in context of this being a SCD patient  - Duonebs  - On ceftriaxone until blood cultures return  Diet:  - Regular   DVT Ppx:  - [place] heparin  Dispo: Disposition is deferred at  this time, awaiting improvement of current medical problems.  Anticipated discharge in approximately 1-2 day(s).   The patient does not have a current PCP (Pcp Not In System) and does not need an [facility] hospital follow-up appointment after discharge.  The patient does not have transportation limitations that hinder transportation to clinic appointments.  .Services Needed at time of discharge: Y = Yes, Blank = No PT:   OT:   RN:   Equipment:   Other:     LOS: 2 days   Dionne Ano, MD 04/01/2014, 1:28 PM

## 2014-04-01 NOTE — Progress Notes (Signed)
[facility] per physician's order. Pt in no s/s of distress. [facility] transporting pt. [facility]. Days[facility] nurse called [facility] to [facility]. Pt on dilaudid PCA - transported with patient. [facility]work [facility].

## 2014-04-02 DIAGNOSIS — D649 Anemia, unspecified: Secondary | ICD-10-CM

## 2014-04-02 DIAGNOSIS — M79609 Pain in unspecified limb: Secondary | ICD-10-CM

## 2014-04-02 LAB — PREPARE RBC (CROSSMATCH)

## 2014-04-02 LAB — ABO/RH: ABO/RH(D): O POS

## 2014-04-02 LAB — LACTATE DEHYDROGENASE: LDH: 721 U/L — AB (ref 94–250)

## 2014-04-02 MED ORDER — HYDROMORPHONE 0.3 MG/ML IV SOLN
INTRAVENOUS | Status: DC
  Administered 2014-04-02: 7.1 mg via INTRAVENOUS
  Administered 2014-04-02: 0.6 mg via INTRAVENOUS
  Administered 2014-04-02: 3.3 mg via INTRAVENOUS

## 2014-04-02 MED ORDER — HYDROMORPHONE 2 MG/ML HIGH CONCENTRATION IV PCA SOLN
INTRAVENOUS | Status: DC
  Administered 2014-04-02: 11:00:00 via INTRAVENOUS
  Administered 2014-04-02: 10.8 mg via INTRAVENOUS
  Administered 2014-04-02: 12.1 mg via INTRAVENOUS
  Administered 2014-04-03: 6.6 mg via INTRAVENOUS
  Administered 2014-04-03: 7.2 mg via INTRAVENOUS
  Administered 2014-04-03: 10:00:00 via INTRAVENOUS
  Administered 2014-04-03: 7.8 mg via INTRAVENOUS
  Administered 2014-04-03: 5.4 mg via INTRAVENOUS
  Filled 2014-04-02 (×2): qty 25

## 2014-04-02 MED ORDER — ACETAMINOPHEN 325 MG PO TABS
650.0000 mg | ORAL_TABLET | ORAL | Status: DC | PRN
  Administered 2014-04-02: 650 mg via ORAL
  Filled 2014-04-02: qty 2

## 2014-04-02 MED ORDER — DEXTROSE-NACL 5-0.45 % IV SOLN
INTRAVENOUS | Status: DC
  Administered 2014-04-02 – 2014-04-05 (×5): via INTRAVENOUS

## 2014-04-02 MED ORDER — MORPHINE SULFATE ER 15 MG PO TBCR
15.0000 mg | EXTENDED_RELEASE_TABLET | Freq: Two times a day (BID) | ORAL | Status: DC
  Administered 2014-04-02 – 2014-04-05 (×7): 15 mg via ORAL
  Filled 2014-04-02 (×7): qty 1

## 2014-04-02 MED ORDER — NALOXONE HCL 0.4 MG/ML IJ SOLN: 0.4000 mg | INTRAMUSCULAR | Status: DC | PRN

## 2014-04-02 MED ORDER — ONDANSETRON HCL 4 MG/2ML IJ SOLN: 4.0000 mg | Freq: Four times a day (QID) | INTRAMUSCULAR | Status: DC | PRN

## 2014-04-02 MED ORDER — SODIUM CHLORIDE 0.9 % IJ SOLN
9.0000 mL | INTRAMUSCULAR | Status: DC | PRN
Start: 2014-04-02 — End: 2014-04-05

## 2014-04-02 MED ORDER — ENOXAPARIN SODIUM 40 MG/0.4ML ~~LOC~~ SOLN
40.0000 mg | SUBCUTANEOUS | Status: DC
  Administered 2014-04-02: 40 mg via SUBCUTANEOUS
  Filled 2014-04-02 (×4): qty 0.4

## 2014-04-02 MED ORDER — SODIUM CHLORIDE 0.9 % IV SOLN
25.0000 mg | Freq: Four times a day (QID) | INTRAVENOUS | Status: DC | PRN
  Filled 2014-04-02: qty 0.5

## 2014-04-02 MED ORDER — DIPHENHYDRAMINE HCL 25 MG PO CAPS: 25.0000 mg | ORAL_CAPSULE | Freq: Four times a day (QID) | ORAL | Status: DC | PRN

## 2014-04-02 MED ORDER — SODIUM CHLORIDE 0.9 % IJ SOLN
9.0000 mL | INTRAMUSCULAR | Status: DC | PRN
Start: 2014-04-02 — End: 2014-04-02

## 2014-04-02 NOTE — Progress Notes (Signed)
Patient Controlled Analgesia 24 hour Summary  Total mg delivered: 18.8 mg Number of demands: 66 Number of deliveries: 37

## 2014-04-02 NOTE — Progress Notes (Signed)
Follow Up:  Notified by RN regarding pt's arrival to [facility] rm 1331.  [facility] and will be managed by Dr. Marlana SalvageMichelle Mathews.  Upon pt's arrival, c/o pain not being managed by current PCA settings and q2hr IV Dilaudid for breakthrough pain. PCA settings increased to full dose Dilaudid PCA.  t also getting Toradol 15 mg IV q 6hrs. Hb as of 1445 on 9/29 was 5.8, baseline appears to be between 7 and 7.5.  RN reports I unit of PRBC's ordered soon after last CBC but pt had continued to refuse transfusion. RN now reports, however, that pt has recently spoken with his mother by phone and is now agreeing to transfusion.  Unfortunately, this will require a new t&s and a new crossmatch. Upon arrival to floor, RN reports pt w/ continued c/o's regarding lack of pain control.  States he does not want PCA, prefers IV Dilaudid boluses.  RN reports patient at times refuses or "forgets" to push his PCA button. Despite patient's c/o's of persistent pain, RN reports he is often noted resting quietly in NAD.  Upon arrival to bedside, patient appears to be sleeping soundly with a normal respiratory pattern in NAD.  Awaiting completion of crossmatch. Will transfuse when blood available. Repeat labs to be obtained after completion of PRBC's.  Will continue to monitor closely.   Illa LevelSahar Osman, PA-C [facility] Pager [phone]

## 2014-04-02 NOTE — Progress Notes (Signed)
Patient arrived to floor via [facility] around 930 pm.  At the beginning of the shift, patient was refusing ordered blood transfusion. He reported that he wanted to get his pain under control first and that the PCA was not helping.  MD was notified and PCA was adjusted.  In addition, education was given regarding importance of blood transfusion.  After speaking with his mother, pt decided on blood transfusion.  He had to be re-type and screened, and then was promptly given 1 unit when blood became available at 0300 am.  Patient still complains of severe pain yet has not been pressing his PCA.  He continues to want bolus doses of dilaudid.  He has been educated numerous times throughout the night regarding the need to use his PCA rather than just bolus doses.  Patient did have periods throughout the night where he slept well.  Will continue to monitor. Kenton King[place]isher[place], Milbert Bixler [place]

## 2014-04-02 NOTE — Progress Notes (Signed)
Spoke with patients' mother and she confirmed that he is Hb SS and has a normal Hb level of 7-8. He had been prescribed Hydrea but made an independent decision to stop taking his Hydrea. Mother reports that he has had multiple problems since stopping his Hydrea.

## 2014-04-02 NOTE — Progress Notes (Signed)
Right upper extremity venous duplex completed:  No evidence of DVT or superficial thrombosis.    

## 2014-04-02 NOTE — Progress Notes (Signed)
Patient switched to high dose dilaudid PCA. Current standard dose PCA 11 ml  wasted in sink, witnessed by Carlyle LipaKim Osborne, RN

## 2014-04-02 NOTE — Progress Notes (Signed)
SICKLE CELL SERVICE PROGRESS NOTE  Matthew Case ZOX:[id number] DOB: 09-18-1995 DOA: 03/30/2014 PCP: Pcp Not In System  Assessment/Plan: Active Problems:   Sickle cell disease, type SS   Sickle cell crisis  1. Sickle Cell Disease with crisis: Pt transferred to us from family [facility]. He rates the pain as 7/10 and with very little relief with the current settings of the PCA. He has used 20.2 mg with 71/42 demands/deliveries. Will change the PCA settings to a high dose PCA at bolus of 6 with 10 minute lockout, continue clinician assisted dose of 1 mg q 2 hrs PRN, Toradol and add his usual MS Contin 15 mg BID. He also has significant swelling  In the soft tissue of the RUE so will obtain a [place] U/S to evaluate for DVT. 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 2. Anemia: Pt reports his usual Hb to be between 5-7 g/dL. He does not know his baseline LDH. He is s/p transfusion 1 unit PRBC. Will check post transfusion H&H and LDH. 3. Sickle Cell Disese: Pt is on Folic acid but not on Hydrea. Will continue Folic Acid. Will try to initiate Hydrea as out patient if patient follows up for care. 4. Fever: Pt had a mild increase in temperature with no focus of infection. Urinalysis, CXR were not indicative of infection. Will D/C antibiotics awaiting cultures. I suspect that fevers were secondary to crisis.  Code Status: Full Code Family Communication: N/A Disposition Plan: Not yet ready for discharge  Matthew A.  Pager [phone]. If 7PM-7AM, please contact night-coverage.  04/02/2014, 10:40 AM  LOS: 3 days   Brief narrative: Matthew Case is an 18 yo man with SS disease (baseline hgb 7-8, acute chest syndrome in 04/2013), cholecystitis s/p cholecystectomy (07/2013) here for evaluation of his lower back and b/l forearm pain that started 6 days ago, subsided, then came back this morning. One week ago, he first noted an upper respiratory illness (coughing, production of some sputum,  congestion, no fever). The pain set in after he had been sick for a few days; he believes he was and is dehydrated. He visited the ED at that time, was afebrile, received IV hydration and pain control, felt better, and was discharged home.  This morning, the pain returned. To him, it feels like pain crises he has had in the past. He took oxycodone at home, which did not relieve his pain. He denies shortness of breath, chest pain, or pain anywhere aside from his lower back and b/l forearms. He has not had chest pain, shortness of breath, fevers, chills, nausea, vomiting, numbness, tingling, light-headedness or trauma.  On this visit to the ED he has been afebrile, he is satting 100% on room air, and his CXR was negative for focal infiltrate; he received ketorolac (30 mg) and morphine x 4 (6 mg total) and 2L IV fluids.   Consultants:  None  Procedures:  None  Antibiotics:  Rocephin 9/29 >>9/30  HPI/Subjective: Pt with self reported Hb SS presented with pain in RUE which he reports is atypical of his usual crises. Pt normally has pain localized to the back with crises but has been having pain in the right elbow and arm. He describes the pain as throbbing and sharp. He rates the pain as 7/10 and with very little relief with the current settings of the PCA.  Objective: Filed Vitals:   04/02/14 [id number] 04/02/14 0325 04/02/14 0536 04/02/14 0746  BP:  132/76 143/80  Pulse:  82 86   Temp:  99.8 F (37.7 C) 99.8 F (37.7 C)   TempSrc:  Oral Oral   Resp: 19 24 18 16   Height:      Weight:      SpO2: 100% 100% 100% 100%   Weight change:   Intake/Output Summary (Last 24 hours) at 04/02/14 1040 Last data filed at 04/02/14 0942  Gross per 24 hour  Intake   7588 ml  Output   4000 ml  Net   3588 ml    General: Alert, awake, oriented x3, in no acute distress.  HEENT: [place]/AT PEERL, EOMI Neck: Trachea midline,  no masses, no thyromegal,y no JVD, no carotid bruit OROPHARYNX:  Moist, No exudate/  erythema/lesions.  Heart: Regular rate and rhythm, without murmurs, rubs, gallops, PMI non-displaced, no heaves or thrills on palpation.  Lungs: Clear to auscultation, no wheezing or rhonchi noted. No increased vocal fremitus resonant to percussion  Abdomen: Soft, nontender, nondistended, positive bowel sounds, no masses no hepatosplenomegaly noted..  Neuro: No focal neurological deficits noted cranial nerves II through XII grossly intact. DTRs 2+ bilaterally upper and lower extremities. Strength 5 out of 5 in bilateral upper and lower extremities. Musculoskeletal: Warmth and swelling noted in RUE and around right elboqw joint. However no erythema noted. Psychiatric: Patient alert and oriented x3, good insight and cognition, good recent to remote recall. Lymph node survey: No cervical axillary or inguinal lymphadenopathy noted.   Data Reviewed: Basic Metabolic Panel:  Recent Labs Lab 03/30/14 0651 03/30/14 2309  NA 141  --   K 4.2  --   CL 103  --   CO2 22  --   GLUCOSE 87  --   BUN 5*  --   CREATININE 0.61  --   CALCIUM 9.0  --   MG  --  1.6   Liver Function Tests:  Recent Labs Lab 03/30/14 0651  AST 35  ALT 17  ALKPHOS 94  BILITOT 2.8*  PROT 7.9  ALBUMIN 4.2   No results found for this basename: LIPASE, AMYLASE,  in the last 168 hours No results found for this basename: AMMONIA,  in the last 168 hours CBC:  Recent Labs Lab 03/30/14 0651 04/01/14 1446  WBC 14.1* 15.8*  NEUTROABS 5.8  --   HGB 7.1* 5.8*  HCT 19.9* 16.8*  MCV 83.3 79.2  PLT 490* 413*   Cardiac Enzymes: No results found for this basename: CKTOTAL, CKMB, CKMBINDEX, TROPONINI,  in the last 168 hours BNP (last 3 results) No results found for this basename: PROBNP,  in the last 8760 hours CBG: No results found for this basename: GLUCAP,  in the last 168 hours  Recent Results (from the past 240 hour(s))  CULTURE, BLOOD (ROUTINE X 2)     Status: None   Collection Time    03/30/14 11:00 PM       Result Value Ref Range Status   Specimen Description BLOOD LEFT HAND   Final   Special Requests BOTTLES DRAWN AEROBIC ONLY 10CC   Final   Culture  Setup Time     Final   Value: 03/31/2014 03:50     Performed at [facility]   Culture     Final   Value:        BLOOD CULTURE RECEIVED NO GROWTH TO DATE CULTURE WILL BE HELD FOR 5 DAYS BEFORE ISSUING A FINAL NEGATIVE REPORT     Performed at [facility]   Report Status  PENDING   Incomplete  CULTURE, BLOOD (ROUTINE X 2)     Status: None   Collection Time    03/30/14 11:09 PM      Result Value Ref Range Status   Specimen Description BLOOD RIGHT FOREARM   Final   Special Requests BOTTLES DRAWN AEROBIC AND ANAEROBIC 10CC EA   Final   Culture  Setup Time     Final   Value: 03/31/2014 03:50     Performed at [facility]   Culture     Final   Value:        BLOOD CULTURE RECEIVED NO GROWTH TO DATE CULTURE WILL BE HELD FOR 5 DAYS BEFORE ISSUING A FINAL NEGATIVE REPORT     Note: Culture results may be compromised due to an excessive volume of blood received in culture bottles.     Performed at [facility]   Report Status PENDING   Incomplete  URINE CULTURE     Status: None   Collection Time    03/31/14  4:35 AM      Result Value Ref Range Status   Specimen Description URINE, CLEAN CATCH   Final   Special Requests NONE   Final   Culture  Setup Time     Final   Value: 03/31/2014 08:21     Performed at [facility] Count     Final   Value: NO GROWTH     Performed at [facility]   Culture     Final   Value: NO GROWTH     Performed at [facility]   Report Status 04/01/2014 FINAL   Final  CULTURE, BLOOD (ROUTINE X 2)     Status: None   Collection Time    04/01/14  8:30 AM      Result Value Ref Range Status   Specimen Description BLOOD RIGHT HAND   Final   Special Requests BOTTLES DRAWN AEROBIC AND ANAEROBIC 10CC   Final   Culture  Setup Time     Final   Value: 04/01/2014  15:10     Performed at [facility]   Culture     Final   Value:        BLOOD CULTURE RECEIVED NO GROWTH TO DATE CULTURE WILL BE HELD FOR 5 DAYS BEFORE ISSUING A FINAL NEGATIVE REPORT     Performed at [facility]   Report Status PENDING   Incomplete  CULTURE, BLOOD (ROUTINE X 2)     Status: None   Collection Time    04/01/14  8:40 AM      Result Value Ref Range Status   Specimen Description BLOOD RIGHT HAND   Final   Special Requests BOTTLES DRAWN AEROBIC AND ANAEROBIC 10CC   Final   Culture  Setup Time     Final   Value: 04/01/2014 15:10     Performed at [facility]   Culture     Final   Value:        BLOOD CULTURE RECEIVED NO GROWTH TO DATE CULTURE WILL BE HELD FOR 5 DAYS BEFORE ISSUING A FINAL NEGATIVE REPORT     Performed at [facility]   Report Status PENDING   Incomplete     Studies: Dg Chest 2 View  04/01/2014   CLINICAL DATA:  History of sickle cell disease, now with fever  EXAM: CHEST  2 VIEW  COMPARISON:  03/30/2014; 03/24/2014; 07/24/2013  FINDINGS: Grossly  unchanged borderline enlarged cardiac silhouette and mediastinal contours. There is unchanged mild diffuse slightly nodular thickening of the pulmonary station. There is minimal pleural parenchymal thickening about the bilateral minor fissures. No discrete focal airspace opacities. No pleural effusion or pneumothorax. No evidence of edema. Stigmata of sickle cell disease involving the thoracic and lumbar vertebral bodies. Post cholecystectomy. There is conspicuous absence of the splenic shadow. Regional soft tissues appear normal.  IMPRESSION: No acute cardiopulmonary disease. Specifically, no evidence of pneumonia.   Electronically Signed   By: Simonne Come M.D.   On: 04/01/2014 13:19   Dg Chest 2 View  03/30/2014   CLINICAL DATA:  Back pain, sickle cell crisis  EXAM: CHEST - 2 VIEW  COMPARISON:  03/24/2014  FINDINGS: Stable cardiomegaly without current CHF or focal pneumonia. Mild central  bronchitic change. Negative for edema, effusion or pneumothorax. Trachea midline. Chronic vertebral changes of sickle cell disease on the lateral view.  IMPRESSION: Cardiomegaly and chronic bronchitic change. No superimposed acute process.   Electronically Signed   By: Ruel Favors M.D.   On: 03/30/2014 10:08   Dg Chest 2 View  03/24/2014   CLINICAL DATA:  Cough, congestion, sickle cell pain  EXAM: CHEST  2 VIEW  COMPARISON:  09/27/2013  FINDINGS: Lungs are clear.  No pleural effusion or pneumothorax.  The heart is top-normal in size.  Thoracolumbar spine is notable for superior/inferior endplate vertebral body endplate changes compatible with sickle cell disease.  Cholecystectomy clips.  IMPRESSION: No evidence of acute cardiopulmonary disease.   Electronically Signed   By: Charline Bills M.D.   On: 03/24/2014 15:42    Scheduled Meds: . sodium chloride   Intravenous Once  . cefTRIAXone (ROCEPHIN)  IV  2 g Intravenous Q24H  . docusate sodium  100 mg Oral BID  . folic acid  1 mg Oral Daily  . heparin subcutaneous  5,000 Units Subcutaneous 3 times per day  . HYDROmorphone PCA 2 mg/mL   Intravenous 6 times per day  . Influenza vac split quadrivalent PF  0.5 mL Intramuscular Tomorrow-1000  . ketorolac  15 mg Intravenous 4 times per day  . morphine  15 mg Oral Q12H  . multivitamin with minerals  1 tablet Oral Daily  . pneumococcal 23 valent vaccine  0.5 mL Intramuscular Tomorrow-1000  . sodium chloride  1,000 mL Intravenous Once   Continuous Infusions: . dextrose 5 % and 0.45% NaCl      Time spent 50 minutes.

## 2014-04-03 DIAGNOSIS — D649 Anemia, unspecified: Secondary | ICD-10-CM

## 2014-04-03 DIAGNOSIS — D571 Sickle-cell disease without crisis: Secondary | ICD-10-CM

## 2014-04-03 DIAGNOSIS — D5700 Hb-SS disease with crisis, unspecified: Principal | ICD-10-CM

## 2014-04-03 LAB — TYPE AND SCREEN
ABO/RH(D): O POS
ABO/RH(D): O POS
Antibody Screen: NEGATIVE
Antibody Screen: NEGATIVE
Unit division: 0
Unit division: 0
Unit division: 0

## 2014-04-03 LAB — CBC WITH DIFFERENTIAL/PLATELET
BASOS ABS: 0 10*3/uL (ref 0.0–0.1)
Basophils Relative: 0 % (ref 0–1)
Eosinophils Absolute: 0.3 10*3/uL (ref 0.0–0.7)
Eosinophils Relative: 2 % (ref 0–5)
HCT: 21.3 % — ABNORMAL LOW (ref 39.0–52.0)
Hemoglobin: 7.3 g/dL — ABNORMAL LOW (ref 13.0–17.0)
LYMPHS PCT: 16 % (ref 12–46)
Lymphs Abs: 2.6 10*3/uL (ref 0.7–4.0)
MCH: 28.9 pg (ref 26.0–34.0)
MCHC: 34.3 g/dL (ref 30.0–36.0)
MCV: 84.2 fL (ref 78.0–100.0)
Monocytes Absolute: 2.8 10*3/uL — ABNORMAL HIGH (ref 0.1–1.0)
Monocytes Relative: 17 % — ABNORMAL HIGH (ref 3–12)
NEUTROS ABS: 10.6 10*3/uL — AB (ref 1.7–7.7)
NEUTROS PCT: 65 % (ref 43–77)
Platelets: 493 10*3/uL — ABNORMAL HIGH (ref 150–400)
RBC: 2.53 MIL/uL — ABNORMAL LOW (ref 4.22–5.81)
RDW: 21.4 % — AB (ref 11.5–15.5)
WBC: 16.3 10*3/uL — ABNORMAL HIGH (ref 4.0–10.5)

## 2014-04-03 LAB — BASIC METABOLIC PANEL
Anion gap: 12 (ref 5–15)
BUN: 9 mg/dL (ref 6–23)
CO2: 25 mEq/L (ref 19–32)
Calcium: 8.8 mg/dL (ref 8.4–10.5)
Chloride: 98 mEq/L (ref 96–112)
Creatinine, Ser: 0.5 mg/dL (ref 0.50–1.35)
GFR calc Af Amer: >90 mL/min (ref >90)
Glucose, Bld: 118 mg/dL — ABNORMAL HIGH (ref 70–99)
POTASSIUM: 4 meq/L (ref 3.7–5.3)
SODIUM: 135 meq/L — AB (ref 137–147)

## 2014-04-03 LAB — LACTATE DEHYDROGENASE: LDH: 548 U/L — ABNORMAL HIGH (ref 94–250)

## 2014-04-03 MED ORDER — HYDROMORPHONE 2 MG/ML HIGH CONCENTRATION IV PCA SOLN
INTRAVENOUS | Status: DC
  Administered 2014-04-03: 6.79 mg via INTRAVENOUS
  Administered 2014-04-03: 8.6 mg via INTRAVENOUS
  Administered 2014-04-03: 5.8 mg via INTRAVENOUS
  Administered 2014-04-04: 6.6 mg via INTRAVENOUS
  Administered 2014-04-04: 15 mg via INTRAVENOUS
  Administered 2014-04-04: 6.6 mg via INTRAVENOUS
  Administered 2014-04-04: 14:00:00 via INTRAVENOUS
  Administered 2014-04-04: 6 mg via INTRAVENOUS
  Administered 2014-04-05: 3.6 mg via INTRAVENOUS
  Administered 2014-04-05: 5.5 mg via INTRAVENOUS
  Administered 2014-04-05 (×2): 6.6 mg via INTRAVENOUS
  Filled 2014-04-03: qty 25

## 2014-04-03 NOTE — Progress Notes (Signed)
INITIAL NUTRITION ASSESSMENT  DOCUMENTATION CODES Per approved criteria  -Underweight   INTERVENTION: -Encouraged PO intake -Offered general healthy nutrition education -RD to continue to monitor  NUTRITION DIAGNOSIS: Increased nutrient needs (protein/kcal) related to increased demand for nutrients as evidenced by BMI < 18.5.   Goal: Pt to meet >/= 90% of their estimated nutrition needs    Monitor:  Total protein/energy intake, labs, weights  Reason for Assessment: BMI < 18.5  18 y.o. male  Admitting Dx: <principal problem not specified>  ASSESSMENT: An 18 year old gentleman admitted with sickle cell painful crisis. Patient was on the family practice service before being transferred to our service. He has been on Dilaudid PCA full dose since yesterday. His pain level is still at 8/10 but more so involving the right upper extremity which is swollen.  -Per discussion with NT, pt's family has been providing pt with outside foods. Was eating breakfast from [employer] during time of RD assessment, PO intake approximately 75% -Pt currently [employer]. Eats well in-between class, denied any changes in weight or appetite -Pt identified as underweight BMI, pt's mother reported pt has always been small framed with a healthy appetite. Previous medical records indicate pt's weight around 130-135 lb -Offered nutrition education regarding general healthy diet, which was declined but family appreciated visit and was encouraged to contact RD with any nutrition related concerns  Height: Ht Readings from Last 1 Encounters:  04/01/14 6' (1.829 m) (81%*, Z = 0.89)   * Growth percentiles are based on CDC 2-20 Years data.    Weight: Wt Readings from Last 1 Encounters:  04/01/14 135 lb 2.3 oz (61.3 kg) (22%*, Z = -0.77)   * Growth percentiles are based on CDC 2-20 Years data.    Ideal Body Weight: 178 lbs  % Ideal Body Weight: 76%  Wt Readings from Last 10 Encounters:   04/01/14 135 lb 2.3 oz (61.3 kg) (22%*, Z = -0.77)  03/24/14 136 lb (61.689 kg) (23%*, Z = -0.72)  09/24/13 132 lb 4.4 oz (60 kg) (21%*, Z = -0.82)  07/26/13 130 lb (58.968 kg) (18%*, Z = -0.91)  07/26/13 130 lb (58.968 kg) (18%*, Z = -0.91)  07/24/13 130 lb (58.968 kg) (18%*, Z = -0.91)  04/19/13 134 lb 6.4 oz (60.963 kg) (27%*, Z = -0.61)  04/18/13 130 lb (58.968 kg) (20%*, Z = -0.84)   * Growth percentiles are based on CDC 2-20 Years data.    Usual Body Weight: 130-135 lb  % Usual Body Weight: 100%  BMI:  Body mass index is 18.32 kg/(m^2). Underweight  Estimated Nutritional Needs: Kcal: 1800-2000 Protein: 70-80 gram Fluid: >/=1800 ml daily  Skin: WDL  Diet Order: General  EDUCATION NEEDS: -No education needs identified at this time   Intake/Output Summary (Last 24 hours) at 04/03/14 1119 Last data filed at 04/03/14 0941  Gross per 24 hour  Intake 3833.75 ml  Output   2475 ml  Net 1358.75 ml    Last BM: 9/29   Labs:   Recent Labs Lab 03/30/14 0651 03/30/14 2309 04/03/14 0410  NA 141  --  135*  K 4.2  --  4.0  CL 103  --  98  CO2 22  --  25  BUN 5*  --  9  CREATININE 0.61  --  0.50  CALCIUM 9.0  --  8.8  MG  --  1.6  --   GLUCOSE 87  --  118*    CBG (last 3)  No results found for this basename: GLUCAP,  in the last 72 hours  Scheduled Meds: . sodium chloride   Intravenous Once  . cefTRIAXone (ROCEPHIN)  IV  2 g Intravenous Q24H  . docusate sodium  100 mg Oral BID  . enoxaparin (LOVENOX) injection  40 mg Subcutaneous Q24H  . folic acid  1 mg Oral Daily  . HYDROmorphone PCA 2 mg/mL   Intravenous 6 times per day  . Influenza vac split quadrivalent PF  0.5 mL Intramuscular Tomorrow-1000  . ketorolac  15 mg Intravenous 4 times per day  . morphine  15 mg Oral Q12H  . multivitamin with minerals  1 tablet Oral Daily  . pneumococcal 23 valent vaccine  0.5 mL Intramuscular Tomorrow-1000  . sodium chloride  1,000 mL Intravenous Once    Continuous  Infusions: . dextrose 5 % and 0.45% NaCl 75 mL/hr at 04/03/14 1610    Past Medical History  Diagnosis Date  . Sickle cell disease     Past Surgical History  Procedure Laterality Date  . Cholecystectomy N/A 07/27/2013    Procedure: LAPAROSCOPIC CHOLECYSTECTOMY ;  Surgeon: Shelly Rubenstein, MD;  Location: [facility] OR;  Service: General;  Laterality: N/A;    Lloyd Huger MS RD LDN Clinical Dietitian Pager:[phone]

## 2014-04-03 NOTE — Progress Notes (Signed)
Subjective: An 18 year old gentleman admitted with sickle cell painful crisis. Patient was on the family practice service before being transferred to our service. He has been on Dilaudid PCA full dose since yesterday. His pain level is still at 8/10 but more so involving the right upper extremity which is swollen. The arm was studied yesterday was venous Dopplers showing no evidence of DVT. He has used 43.3 mg of Dilaudid with 99 demands and 74 deliveries in the last 24 hours. Patient's mother is here with him I would like to establish care at the sickle cell center. Patient denies any shortness of breath cough or chest pain.  Objective: Vital signs in last 24 hours: Temp:  [98.4 F (36.9 C)-99.8 F (37.7 C)] 99.8 F (37.7 C) (10/01 0941) Pulse Rate:  [67-92] 79 (10/01 0941) Resp:  [10-23] 13 (10/01 1006) BP: (129-178)/(66-88) 129/72 mmHg (10/01 0941) SpO2:  [94 %-100 %] 95 % (10/01 1006) Weight change:  Last BM Date: 03/30/14  Intake/Output from previous day: 09/30 0701 - 10/01 0700 In: 3833.8 [P.O.:120; I.V.:3608.8; IV Piggyback:105] Out: 2775 [Urine:2775] Intake/Output this shift: Total I/O In: 120 [P.O.:120] Out: 400 [Urine:400]  General appearance: alert, cooperative and no distress Eyes: conjunctivae/corneas clear. PERRL, EOM's intact. Fundi benign. Neck: no adenopathy, no carotid bruit, no JVD, supple, symmetrical, trachea midline and thyroid not enlarged, symmetric, no tenderness/mass/nodules Back: symmetric, no curvature. ROM normal. No CVA tenderness. Resp: clear to auscultation bilaterally Chest wall: no tenderness Cardio: regular rate and rhythm, S1, S2 normal, no murmur, click, rub or gallop GI: soft, non-tender; bowel sounds normal; no masses,  no organomegaly Extremities: extremities normal, atraumatic, no cyanosis or edema Pulses: 2+ and symmetric Skin: Skin color, texture, turgor normal. No rashes or lesions Neurologic: Grossly normal  Lab Results:  Recent  Labs  04/01/14 1446 04/03/14 0410  WBC 15.8* 16.3*  HGB 5.8* 7.3*  HCT 16.8* 21.3*  PLT 413* 493*   BMET  Recent Labs  04/03/14 0410  NA 135*  K 4.0  CL 98  CO2 25  GLUCOSE 118*  BUN 9  CREATININE 0.50  CALCIUM 8.8    Studies/Results: Dg Chest 2 View  04/01/2014   CLINICAL DATA:  History of sickle cell disease, now with fever  EXAM: CHEST  2 VIEW  COMPARISON:  03/30/2014; 03/24/2014; 07/24/2013  FINDINGS: Grossly unchanged borderline enlarged cardiac silhouette and mediastinal contours. There is unchanged mild diffuse slightly nodular thickening of the pulmonary station. There is minimal pleural parenchymal thickening about the bilateral minor fissures. No discrete focal airspace opacities. No pleural effusion or pneumothorax. No evidence of edema. Stigmata of sickle cell disease involving the thoracic and lumbar vertebral bodies. Post cholecystectomy. There is conspicuous absence of the splenic shadow. Regional soft tissues appear normal.  IMPRESSION: No acute cardiopulmonary disease. Specifically, no evidence of pneumonia.   Electronically Signed   By: Simonne Come M.D.   On: 04/01/2014 13:19    Medications: I have reviewed the patient's current medications.  Assessment/Plan: This is an 18 year old gentleman admitted with sickle cell painful crisis.  #1 sickle cell painful crisis: Patient is still having pain especially when he is not using his medications. Especially at night. I will add nighttime continues dose and so he can get relief. I have informed patient of my plan. As well as his mother. We'll continue his oral MS Contin as well as Toradol and supportive care.  #2 sickle cell anemia: Patient is status post transfusion of 2 units of packed red blood cells. His  hemoglobin looks stable at 7 gram. LDH is also stabilized. We'll continue to monitor.  #3 transient fever: The symptoms resolved.  #4 right arm swelling: This is most likely as a result of sickle cell crisis.  Doppler ultrasound is negative. Warm compress has been advised with passive exercise.  LOS: 4 days   Matthew Case,LAWAL 04/03/2014, 11:06 AM

## 2014-04-03 NOTE — Progress Notes (Signed)
Wasted 64mlHarris16[facility] S(36Luberta MuWashProgress BWaynetta SaKoreandJoDesoto Regional HealthMagnusG25mleHarris16ReddingLuberta MuWashProgress BWaynetta SaKoreandJoKingwood Surgery CenterData processing MinervaSt. Luke'S PatMagnusG5muiHarris16[facility](47Luberta MuWashProgress BWaynetta SaKoreandJoCobre Valley Regional Medical Cent7Data proceMagnusJaHarris16Mission EndLuberta MuWashProgress BWaynetta SandJoSurDataMagGerald DexterilThe [facility] RochesteStansCataract Instit70 anLAeK$ ) from High Concentration Dilaudid PCA syringe with Jesse Mack PharmD. PCA reading syringe empty with this remaining volume in syringe. Verified PCA settings with Cindy Hughey, RN when new syringe placed.

## 2014-04-03 NOTE — Progress Notes (Signed)
PCA 24 hour use: 43.3 mg 99 attempts 74 deliveries

## 2014-04-04 LAB — CBC WITH DIFFERENTIAL/PLATELET
BASOS ABS: 0 10*3/uL (ref 0.0–0.1)
BASOS PCT: 0 % (ref 0–1)
EOS ABS: 0.6 10*3/uL (ref 0.0–0.7)
EOS PCT: 4 % (ref 0–5)
HEMATOCRIT: 21.2 % — AB (ref 39.0–52.0)
Hemoglobin: 7 g/dL — ABNORMAL LOW (ref 13.0–17.0)
Lymphocytes Relative: 16 % (ref 12–46)
Lymphs Abs: 2.2 10*3/uL (ref 0.7–4.0)
MCH: 28 pg (ref 26.0–34.0)
MCHC: 33 g/dL (ref 30.0–36.0)
MCV: 84.8 fL (ref 78.0–100.0)
MONO ABS: 2.1 10*3/uL — AB (ref 0.1–1.0)
Monocytes Relative: 15 % — ABNORMAL HIGH (ref 3–12)
Neutro Abs: 8.9 10*3/uL — ABNORMAL HIGH (ref 1.7–7.7)
Neutrophils Relative %: 65 % (ref 43–77)
Platelets: 521 10*3/uL — ABNORMAL HIGH (ref 150–400)
RBC: 2.5 MIL/uL — ABNORMAL LOW (ref 4.22–5.81)
RDW: 20.8 % — AB (ref 11.5–15.5)
WBC: 13.8 10*3/uL — AB (ref 4.0–10.5)

## 2014-04-04 LAB — COMPREHENSIVE METABOLIC PANEL
ALBUMIN: 3.4 g/dL — AB (ref 3.5–5.2)
ALT: 16 U/L (ref 0–53)
AST: 28 U/L (ref 0–37)
Alkaline Phosphatase: 117 U/L (ref 39–117)
Anion gap: 13 (ref 5–15)
BUN: 8 mg/dL (ref 6–23)
CALCIUM: 9 mg/dL (ref 8.4–10.5)
CO2: 26 mEq/L (ref 19–32)
CREATININE: 0.47 mg/dL — AB (ref 0.50–1.35)
Chloride: 96 mEq/L (ref 96–112)
GFR calc Af Amer: >90 mL/min (ref >90)
GFR calc non Af Amer: >90 mL/min (ref >90)
Glucose, Bld: 111 mg/dL — ABNORMAL HIGH (ref 70–99)
Potassium: 3.9 mEq/L (ref 3.7–5.3)
SODIUM: 135 meq/L — AB (ref 137–147)
TOTAL PROTEIN: 7 g/dL (ref 6.0–8.3)
Total Bilirubin: 2.7 mg/dL — ABNORMAL HIGH (ref 0.3–1.2)

## 2014-04-04 MED ORDER — OXYCODONE HCL 5 MG PO TABS
10.0000 mg | ORAL_TABLET | ORAL | Status: DC | PRN
  Administered 2014-04-04 – 2014-04-05 (×4): 10 mg via ORAL
  Administered 2014-04-05: 5 mg via ORAL
  Administered 2014-04-05: 10 mg via ORAL
  Filled 2014-04-04 (×6): qty 2

## 2014-04-04 NOTE — Progress Notes (Signed)
Patient Controlled Analgesia 24 hour Summary  Total mg delivered: 41.2 mg Number of demands: 97 Number of deliveries: 6364

## 2014-04-04 NOTE — Progress Notes (Signed)
Subjective: Patient feeling better. Still having 7/10 pain and using the Q2hr and using his PCA  And has used 41.2 mg of Dilaudid in the last 24 hours was 97 demands and 64 deliveries. No cough no shortness of breath. He denied any other complaints. His mother has set him up for the sickle cell clinic where he was sited in his care after discharge. Patient is getting MS Contin for long-acting but not getting his short acting oxycodone. Objective: Vital signs in last 24 hours: Temp:  [98.2 F (36.8 C)-98.7 F (37.1 C)] 98.6 F (37 C) (10/02 0530) Pulse Rate:  [72-83] 83 (10/02 0530) Resp:  [11-19] 18 (10/02 0800) BP: (118-126)/(62-74) 122/68 mmHg (10/02 0530) SpO2:  [97 %-100 %] 100 % (10/02 0800) Weight change:  Last BM Date: 03/30/14  Intake/Output from previous day: 10/01 0701 - 10/02 0700 In: 2092.8 [P.O.:360; I.V.:1682.8; IV Piggyback:50] Out: 1400 [Urine:1400] Intake/Output this shift:    General appearance: alert, cooperative and no distress Eyes: conjunctivae/corneas clear. PERRL, EOM's intact. Fundi benign. Neck: no adenopathy, no carotid bruit, no JVD, supple, symmetrical, trachea midline and thyroid not enlarged, symmetric, no tenderness/mass/nodules Back: symmetric, no curvature. ROM normal. No CVA tenderness. Resp: clear to auscultation bilaterally Chest wall: no tenderness Cardio: regular rate and rhythm, S1, S2 normal, no murmur, click, rub or gallop GI: soft, non-tender; bowel sounds normal; no masses,  no organomegaly Extremities: extremities normal, atraumatic, no cyanosis or edema Pulses: 2+ and symmetric Skin: Skin color, texture, turgor normal. No rashes or lesions Neurologic: Grossly normal  Lab Results:  Recent Labs  04/03/14 0410 04/04/14 0525  WBC 16.3* 13.8*  HGB 7.3* 7.0*  HCT 21.3* 21.2*  PLT 493* 521*   BMET  Recent Labs  04/03/14 0410 04/04/14 0525  NA 135* 135*  K 4.0 3.9  CL 98 96  CO2 25 26  GLUCOSE 118* 111*  BUN 9 8   CREATININE 0.50 0.47*  CALCIUM 8.8 9.0    Studies/Results: No results found.  Medications: I have reviewed the patient's current medications.  Assessment/Plan: This is an 18 year old gentleman admitted with sickle cell painful crisis.  #1 sickle cell painful crisis: Patient has improved. We will start his oral oxycodone for breakthrough pain and stop the physician assisted dosing. If he does better overnight we will discharge him home.  #2 sickle cell anemia: Hemoglobin is stable around 7. We will continue to monitor for  #3 transient fever: The symptoms resolved. Will DC antibiotics  #4 right arm swelling: This is getting much better. He is to have swelling around the elbow but per patient himself and he feels better and the swelling is going down. Continue with conservative measures.   LOS: 5 days   GARBA,LAWAL 04/04/2014, 10:23 AM

## 2014-04-05 LAB — CBC WITH DIFFERENTIAL/PLATELET
BASOS PCT: 0 % (ref 0–1)
Basophils Absolute: 0 10*3/uL (ref 0.0–0.1)
EOS PCT: 3 % (ref 0–5)
Eosinophils Absolute: 0.5 10*3/uL (ref 0.0–0.7)
HCT: 21.3 % — ABNORMAL LOW (ref 39.0–52.0)
HEMOGLOBIN: 7.2 g/dL — AB (ref 13.0–17.0)
LYMPHS ABS: 1.8 10*3/uL (ref 0.7–4.0)
Lymphocytes Relative: 11 % — ABNORMAL LOW (ref 12–46)
MCH: 27.5 pg (ref 26.0–34.0)
MCHC: 33.8 g/dL (ref 30.0–36.0)
MCV: 81.3 fL (ref 78.0–100.0)
MONO ABS: 1.6 10*3/uL — AB (ref 0.1–1.0)
Monocytes Relative: 10 % (ref 3–12)
Neutro Abs: 12.4 10*3/uL — ABNORMAL HIGH (ref 1.7–7.7)
Neutrophils Relative %: 76 % (ref 43–77)
PLATELETS: 641 10*3/uL — AB (ref 150–400)
RBC: 2.62 MIL/uL — AB (ref 4.22–5.81)
RDW: 20.3 % — ABNORMAL HIGH (ref 11.5–15.5)
WBC: 16.3 10*3/uL — AB (ref 4.0–10.5)

## 2014-04-05 LAB — COMPREHENSIVE METABOLIC PANEL
ALBUMIN: 3.8 g/dL (ref 3.5–5.2)
ALT: 38 U/L (ref 0–53)
AST: 48 U/L — AB (ref 0–37)
Alkaline Phosphatase: 168 U/L — ABNORMAL HIGH (ref 39–117)
Anion gap: 12 (ref 5–15)
BILIRUBIN TOTAL: 2.9 mg/dL — AB (ref 0.3–1.2)
BUN: 6 mg/dL (ref 6–23)
CHLORIDE: 94 meq/L — AB (ref 96–112)
CO2: 27 mEq/L (ref 19–32)
Calcium: 9.6 mg/dL (ref 8.4–10.5)
Creatinine, Ser: 0.52 mg/dL (ref 0.50–1.35)
GFR calc Af Amer: >90 mL/min (ref >90)
GFR calc non Af Amer: >90 mL/min (ref >90)
Glucose, Bld: 111 mg/dL — ABNORMAL HIGH (ref 70–99)
POTASSIUM: 4.1 meq/L (ref 3.7–5.3)
Sodium: 133 mEq/L — ABNORMAL LOW (ref 137–147)
Total Protein: 8 g/dL (ref 6.0–8.3)

## 2014-04-05 MED ORDER — HYDROXYUREA 500 MG PO CAPS: 500.0000 mg | ORAL_CAPSULE | Freq: Every day | ORAL | Status: DC

## 2014-04-05 MED ORDER — FOLIC ACID 1 MG PO TABS: 1.0000 mg | ORAL_TABLET | Freq: Every day | ORAL | Status: DC

## 2014-04-05 MED ORDER — MORPHINE SULFATE ER 15 MG PO TBCR: 15.0000 mg | EXTENDED_RELEASE_TABLET | Freq: Two times a day (BID) | ORAL | Status: DC

## 2014-04-05 MED ORDER — ADULT MULTIVITAMIN W/MINERALS CH
1.0000 | ORAL_TABLET | Freq: Every day | ORAL | Status: DC
Start: 2014-04-05 — End: 2015-11-03

## 2014-04-05 MED ORDER — OXYCODONE HCL 10 MG PO TABS: 10.0000 mg | ORAL_TABLET | Freq: Four times a day (QID) | ORAL | Status: DC | PRN

## 2014-04-05 NOTE — Progress Notes (Signed)
Patient 24 hr PCA use  12.4 mg  23  Demands 21  Delivered

## 2014-04-05 NOTE — Progress Notes (Signed)
Patient discharged. Patient given discharge instructions. Patient verbalizes understanding. Patient transferred by wheelchair to car.

## 2014-04-05 NOTE — Discharge Summary (Signed)
Physician Discharge Summary  Patient ID: Matthew Case MRN: [id number] DOB/AGE: [birth year] 17 y.o.  Admit date: 03/30/2014 Discharge date: 04/05/2014  Admission Diagnoses:  Discharge Diagnoses:  Active Problems:   Sickle cell disease, type SS   Sickle cell crisis   Discharged Condition: good  Hospital Course: This is an 18 year old gentleman who is new to our service that was admitted with sickle cell painful crisis. 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 He was initiated on the family practice service therefore transferred to our service. Patient takes opiates as needed prior to this admission. He however has significant painful crisis with swelling of his right upper extremities probably from infarcts. He was treated with IV Dilaudid PCA, Toradol, IV hydration as well as supportive care such as heating pad. Patient responded to treatment gradually. His right upper extremity is wet check for possible DVT but was negative. His pain level went from 10 out of 10 on admission to 3/10 at the time of discharge. His mother also came in to town and helped establish patient with a sickle cell clinic. He was discharged to set up an appointment with the sickle cell clinic. I am giving him prescriptions for his pain medications including oxycodone, MS Contin as well as folic acid and hydroxyurea. He will be reviewed in the clinic and further prescriptions will be given from the clinic.   Consults: None  Significant Diagnostic Studies: labs: Serial CBCs and CMP is were checked and were mostly stable and radiology: Ultrasound: right upper extremity showing no DVT  Treatments: IV hydration and analgesia: Dilaudid and Morphine  Discharge Exam: Blood pressure 139/66, pulse 80, temperature 99.5 F (37.5 C), temperature source Oral, resp. rate 17, height 6' (1.829 m), weight 60.873 kg (134 lb 3.2 oz), SpO2 97.00%. General appearance: alert, cooperative and no distress Head: Normocephalic, without obvious  abnormality, atraumatic Neck: no adenopathy, no carotid bruit, no JVD, supple, symmetrical, trachea midline and thyroid not enlarged, symmetric, no tenderness/mass/nodules Back: symmetric, no curvature. ROM normal. No CVA tenderness. Resp: clear to auscultation bilaterally Chest wall: no tenderness Cardio: regular rate and rhythm, S1, S2 normal, no murmur, click, rub or gallop GI: soft, non-tender; bowel sounds normal; no masses,  no organomegaly Extremities: extremities normal, atraumatic, no cyanosis or edema Pulses: 2+ and symmetric Skin: Skin color, texture, turgor normal. No rashes or lesions Neurologic: Grossly normal  Disposition: 01-Home or Self Care     Medication List         folic acid 1 MG tablet  Commonly known as:  FOLVITE  Take 1 tablet (1 mg total) by mouth daily.     ibuprofen 600 MG tablet  Commonly known as:  ADVIL,MOTRIN  Take 600 mg by mouth every 6 (six) hours as needed for mild pain.     morphine 15 MG 12 hr tablet  Commonly known as:  MS CONTIN  Take 1 tablet (15 mg total) by mouth every 12 (twelve) hours.     multivitamin with minerals Tabs tablet  Take 1 tablet by mouth daily.     Oxycodone HCl 10 MG Tabs  Take 1 tablet (10 mg total) by mouth 4 (four) times daily as needed (moderate or severe pain).     oxyCODONE-acetaminophen 5-325 MG per tablet  Commonly known as:  PERCOCET/ROXICET  Take 2 tablets by mouth every 4 (four) hours as needed for moderate pain or severe pain.           Follow-up Information   Follow up with [facility].   Specialty:  Internal Medicine   Contact information:   [address] [place] [address] [phone]      Signed: Lonia BloodGARBA,LAWAL 04/05/2014, 11:45 AM  Time spent 36 minutes

## 2014-04-05 NOTE — Progress Notes (Signed)
Patient discharged home. 10 ml of high concentration dilaudid PCA wasted in sink. Witnessed by Carlyle LipaKim Osborne, RN

## 2014-04-06 LAB — CULTURE, BLOOD (ROUTINE X 2)
CULTURE: NO GROWTH
Culture: NO GROWTH

## 2014-04-07 LAB — CULTURE, BLOOD (ROUTINE X 2)
Culture: NO GROWTH
Culture: NO GROWTH

## 2014-06-03 ENCOUNTER — Ambulatory Visit (INDEPENDENT_AMBULATORY_CARE_PROVIDER_SITE_OTHER): Payer: BC Managed Care – PPO | Admitting: Family Medicine

## 2014-06-03 VITALS — BP 112/64 | HR 79 | Temp 98.1°F | Resp 16 | Ht 72.0 in | Wt 134.0 lb

## 2014-06-03 DIAGNOSIS — D571 Sickle-cell disease without crisis: Secondary | ICD-10-CM

## 2014-06-03 LAB — COMPLETE METABOLIC PANEL WITH GFR
ALT: 24 U/L (ref 0–53)
AST: 46 U/L — ABNORMAL HIGH (ref 0–37)
Albumin: 5.1 g/dL (ref 3.5–5.2)
Alkaline Phosphatase: 83 U/L (ref 39–117)
BILIRUBIN TOTAL: 4.3 mg/dL — AB (ref 0.2–1.1)
BUN: 8 mg/dL (ref 6–23)
CO2: 25 meq/L (ref 19–32)
CREATININE: 0.61 mg/dL (ref 0.50–1.35)
Calcium: 9.5 mg/dL (ref 8.4–10.5)
Chloride: 105 mEq/L (ref 96–112)
GLUCOSE: 93 mg/dL (ref 70–99)
Potassium: 4.4 mEq/L (ref 3.5–5.3)
Sodium: 140 mEq/L (ref 135–145)
Total Protein: 7.9 g/dL (ref 6.0–8.3)

## 2014-06-03 LAB — LACTATE DEHYDROGENASE: LDH: 535 U/L — AB (ref 94–250)

## 2014-06-03 MED ORDER — OXYCODONE HCL 10 MG PO TABS: 10.0000 mg | ORAL_TABLET | Freq: Four times a day (QID) | ORAL | Status: DC | PRN

## 2014-06-03 MED ORDER — IBUPROFEN 600 MG PO TABS: 600.0000 mg | ORAL_TABLET | Freq: Four times a day (QID) | ORAL | Status: DC | PRN

## 2014-06-03 MED ORDER — HYDROXYUREA 500 MG PO CAPS: 500.0000 mg | ORAL_CAPSULE | Freq: Two times a day (BID) | ORAL | Status: DC

## 2014-06-03 MED ORDER — FOLIC ACID 1 MG PO TABS: 1.0000 mg | ORAL_TABLET | Freq: Every day | ORAL | Status: DC

## 2014-06-03 NOTE — Progress Notes (Signed)
Subjective:    Patient ID: Matthew Case, male    DOB: 02-06-1996, 18 y.o.   MRN: [id number]  HPI [name] is a 18 year old with a history of sickle cell anemia, HbSS that presents to establish care. He states that [place] and is a sophomore [employer]. His primary physician was [facility]. He reports that sickle cell is controlled. He is currently taking Folic acid and Hydrea daily. He states that current medication regimen was started prior to hospital discharge 2 months ago. He reports that he was prescribed MS Contin every 12 hours, but has not been taking consistently. He states that he only takes opiate medications when needed, including long acting. He currently denies headache, weakness, fatigue, pain, nausea, vomiting, or diarrhea.    Past Medical History  Diagnosis Date  . Sickle cell disease    Review of Systems  Constitutional: Negative.  Negative for fever and fatigue.  HENT: Negative.   Eyes: Negative.   Respiratory: Negative.   Cardiovascular: Negative.   Gastrointestinal: Negative.   Endocrine: Negative.   Genitourinary: Negative.   Musculoskeletal: Negative.  Negative for myalgias and back pain.  Skin: Negative.  Negative for rash.  Allergic/Immunologic: Negative.   Neurological: Negative.   Hematological: Negative.   Psychiatric/Behavioral: Negative.  Negative for sleep disturbance.       Objective:   Physical Exam  Constitutional: He is oriented to person, place, and time. He appears well-developed and well-nourished.  HENT:  Head: Normocephalic and atraumatic.  Right Ear: External ear normal.  Left Ear: External ear normal.  Eyes: Conjunctivae are normal. Pupils are equal, round, and reactive to light. Lids are everted and swept, no foreign bodies found.  Neck: Normal range of motion. Neck supple.  Cardiovascular: Normal rate, normal heart sounds and intact distal pulses.   Pulmonary/Chest: Effort normal and  breath sounds normal.  Abdominal: Soft. Bowel sounds are normal.  Musculoskeletal: Normal range of motion.  Neurological: He is alert and oriented to person, place, and time. He has normal reflexes.  Skin: Skin is warm, dry and intact.  Psychiatric: He has a normal mood and affect. His speech is normal and behavior is normal. Judgment and thought content normal. Cognition and memory are normal.         BP 112/64 mmHg  Pulse 79  Temp(Src) 98.1 F (36.7 C) (Oral)  Resp 16  Ht 6' (1.829 m)  Wt 134 lb (60.782 kg)  BMI 18.17 kg/m2 Assessment & Plan:   Sickle cell disease, type SS Sickle cell disease - Continue Hydrea 500 mg twice daily. We discussed the need for good hydration, monitoring of hydration status, avoidance of heat, cold, stress, and infection triggers. We discussed the risks and benefits of Hydrea, including bone marrow suppression, the possibility of GI upset, skin ulcers, hair thinning, and teratogenicity. The patient was reminded of the need to seek medical attention of any symptoms of bleeding, anemia, or infection. [name] and I discussed the importance of using barrier protection with sexual intercourse. Continue folic acid 1 mg daily to prevent aplastic bone marrow crises.   2. Pulmonary evaluation - Patient denies severe recurrent wheezes, shortness of breath with exercise, or persistent cough. If these symptoms develop, pulmonary function tests with spirometry will be ordered, and if abnormal, plan on referral to Pulmonology for further evaluation.  3. Cardiac - Routine screening for pulmonary hypertension is not recommended. Patient refused EKG due to time  constraints. Will check EKG at 3 month follow up.   4. Eye - High risk of proliferative retinopathy. Annual eye exam with retinal exam recommended to patient. He states that he has an eye exam scheduled in [place]. He cannot recall the name of opthalmologist.   5. Immunization status - [name] is up to date on  vaccinations. Yearly influenza vaccination is recommended, as well as being up to date with Meningococcal and Pneumococcal vaccines.   6. Acute and chronic painful episodes - We agreed on opiate medication schedule. We discussed that he is to receive his Schedule II prescriptions only from the sickle cell medical center. He is also aware that his prescription history is available to us online through the [facility] CSRS. Controlled substance agreement signed on 06/03/2014. We reminded [name]  that all patients receiving Schedule II narcotics must be seen for follow up every three months. We reviewed the terms of our pain agreement, including the need to keep medicines in a safe locked location away from children or pets, and the need to report excess sedation or constipation, measures to avoid constipation, and policies related to early refills and stolen prescriptions. According to the [place] [facility] program, we have reviewed details related to analgesia, adverse effects, aberrant behaviors.  7. Iron Overload- [name] has not had frequent transfusions, he is not at risk of iron overload.   8. Vitamin D deficiency - Will check for vitamin D deficenncy.   - Vitamin D, 25-hydroxy - CBC with Differential - COMPLETE METABOLIC PANEL WITH GFR - Lactate Dehydrogenase - Reticulocytes - Hemoglobinopathy evaluation - folic acid (FOLVITE) 1 MG tablet; Take 1 tablet (1 mg total) by mouth daily.  Dispense: 30 tablet; Refill: 11 - hydroxyurea (HYDREA) 500 MG capsule; Take 1 capsule (500 mg total) by mouth 2 (two) times daily. May take with food to minimize GI side effects.  Dispense: 60 capsule; Refill: 2 - ibuprofen (ADVIL,MOTRIN) 600 MG tablet; Take 1 tablet (600 mg total) by mouth every 6 (six) hours as needed for mild pain.  Dispense: 30 tablet; Refill: 2 - Oxycodone HCl 10 MG TABS; Take 1 tablet (10 mg total) by mouth 4 (four) times daily as needed (moderate or severe pain).  Dispense: 30 tablet;  Refill: 0     The above recommendations are taken from the NIH Evidence-Based Management of Sickle Cell Disease: Expert Panel Report, [id number].        Massie MaroonHollis,Sebasthian Stailey M, FNP

## 2014-06-03 NOTE — Patient Instructions (Signed)
Sickle Cell Anemia, Adult Sickle cell anemia is a condition in which red blood cells have an abnormal "sickle" shape. This abnormal shape shortens the cells' life span, which results in a lower than normal concentration of red blood cells in the blood. The sickle shape also causes the cells to clump together and block free blood flow through the blood vessels. As a result, the tissues and organs of the body do not receive enough oxygen. Sickle cell anemia causes organ damage and pain and increases the risk of infection. CAUSES  Sickle cell anemia is a genetic disorder. Those who receive two copies of the gene have the condition, and those who receive one copy have the trait. RISK FACTORS The sickle cell gene is most common in people whose families originated in [place]. Other areas of the globe where sickle cell trait occurs include the Mediterranean, South and [place], the [place], and the Middle [place].  SIGNS AND SYMPTOMS  Pain, especially in the extremities, back, chest, or abdomen (common). The pain may start suddenly or may develop following an illness, especially if there is dehydration. Pain can also occur due to overexertion or exposure to extreme temperature changes.  Frequent severe bacterial infections, especially certain types of pneumonia and meningitis.  Pain and swelling in the hands and feet.  Decreased activity.   Loss of appetite.   Change in behavior.  Headaches.  Seizures.  Shortness of breath or difficulty breathing.  Vision changes.  Skin ulcers. Those with the trait may not have symptoms or they may have mild symptoms.  DIAGNOSIS  Sickle cell anemia is diagnosed with blood tests that demonstrate the genetic trait. It is often diagnosed during the newborn period, due to mandatory testing nationwide. A variety of blood tests, X-rays, CT scans, MRI scans, ultrasounds, and lung function tests may also be done to monitor the condition. TREATMENT  Sickle  cell anemia may be treated with:  Medicines. You may be given pain medicines, antibiotic medicines (to treat and prevent infections) or medicines to increase the production of certain types of hemoglobin.  Fluids.  Oxygen.  Blood transfusions. HOME CARE INSTRUCTIONS   Drink enough fluid to keep your urine clear or pale yellow. Increase your fluid intake in hot weather and during exercise.  Do not smoke. Smoking lowers oxygen levels in the blood.   Only take over-the-counter or prescription medicines for pain, fever, or discomfort as directed by your health care provider.  Take antibiotics as directed by your health care provider. Make sure you finish them it even if you start to feel better.   Take supplements as directed by your health care provider.   Consider wearing a medical alert bracelet. This tells anyone caring for you in an emergency of your condition.   When traveling, keep your medical information, health care provider's names, and the medicines you take with you at all times.   If you develop a fever, do not take medicines to reduce the fever right away. This could cover up a problem that is developing. Notify your health care provider.  Keep all follow-up appointments with your health care provider. Sickle cell anemia requires regular medical care. SEEK MEDICAL CARE IF: You have a fever. SEEK IMMEDIATE MEDICAL CARE IF:   You feel dizzy or faint.   You have new abdominal pain, especially on the left side near the stomach area.   You develop a persistent, often uncomfortable and painful penile erection (priapism). If this is not treated immediately it   will lead to impotence.   You have numbness your arms or legs or you have a hard time moving them.   You have a hard time with speech.   You have a fever or persistent symptoms for more than 2-3 days.   You have a fever and your symptoms suddenly get worse.   You have signs or symptoms of infection.  These include:   Chills.   Abnormal tiredness (lethargy).   Irritability.   Poor eating.   Vomiting.   You develop pain that is not helped with medicine.   You develop shortness of breath.  You have pain in your chest.   You are coughing up pus-like or bloody sputum.   You develop a stiff neck.  Your feet or hands swell or have pain.  Your abdomen appears bloated.  You develop joint pain. MAKE SURE YOU:  Understand these instructions. Document Released: 09/28/2005 Document Revised: 11/04/2013 Document Reviewed: 01/30/2013 ExitCare Patient Information 2015 [employer]. This information is not intended to replace advice given to you by your health care provider. Make sure you discuss any questions you have with your health care provider. Hydroxyurea capsules What is this medicine? HYDROXYUREA (hye drox ee yoor EE a) is a chemotherapy drug. It slows the growth of cancer cells. This medicine is used to treat certain leukemias, skin cancer, head and neck cancer, and advanced ovarian cancer. It is also used to control the painful crises of sickle cell anemia. This medicine may be used for other purposes; ask your health care provider or pharmacist if you have questions. COMMON BRAND NAME(S): Droxia, Hydrea What should I tell my health care provider before I take this medicine? They need to know if you have any of these conditions: -immune system problems -infection (especially a virus infection such as chickenpox, cold sores, or herpes) -kidney disease -low blood counts, like low white cell, platelet, or red cell counts -previous or ongoing radiation therapy -an unusual or allergic reaction to hydroxyurea, other chemotherapy, other medicines, foods, dyes, or preservatives -pregnant or trying to get pregnant -breast-feeding How should I use this medicine? Take this medicine by mouth with a glass of water. Follow the directions on the prescription label. Take your  medicine at regular intervals. Do not take it more often than directed. Do not stop taking except on your doctor's advice. People who are not taking this medicine should not be exposed to it. Wash your hands before and after handling your bottle or medicine. Caregivers should wear disposable gloves if they must touch the bottle or medicine. Clean up any medicine powder that spills with a damp disposable towel and throw the towel away in a closed container, such as a plastic bag. Talk to your pediatrician regarding the use of this medicine in children. Special care may be needed. Patients over 65 years old may have a stronger reaction and need a smaller dose. Overdosage: If you think you have taken too much of this medicine contact a poison control center or emergency room at once. NOTE: This medicine is only for you. Do not share this medicine with others. What if I miss a dose? If you miss a dose, take it as soon as you can. If it is almost time for your next dose, take only that dose. Do not take double or extra doses. What may interact with this medicine? -didanosine -other chemotherapy agents -stavudine -tenofovir -vaccines This list may not describe all possible interactions. Give your health care provider a   list of all the medicines, herbs, non-prescription drugs, or dietary supplements you use. Also tell them if you smoke, drink alcohol, or use illegal drugs. Some items may interact with your medicine. What should I watch for while using this medicine? This drug may make you feel generally unwell. This is not uncommon, as chemotherapy can affect healthy cells as well as cancer cells. Report any side effects. Continue your course of treatment even though you feel ill unless your doctor tells you to stop. You will receive regular blood tests during your treatment. Call your doctor or health care professional for advice if you get a fever, chills or sore throat, or other symptoms of a cold or  flu. Do not treat yourself. This drug decreases your body's ability to fight infections. Try to avoid being around people who are sick. This medicine may increase your risk to bruise or bleed. Call your doctor or health care professional if you notice any unusual bleeding. Be careful brushing and flossing your teeth or using a toothpick because you may get an infection or bleed more easily. If you have any dental work done, tell your dentist you are receiving this medicine. Avoid taking products that contain aspirin, acetaminophen, ibuprofen, naproxen, or ketoprofen unless instructed by your doctor. These medicines may hide a fever. Do not become pregnant while taking this medicine. Women should inform their doctor if they wish to become pregnant or think they might be pregnant. There is a potential for serious side effects to an unborn child. Men should inform their doctors if they wish to father a child. This medicine may lower sperm counts. Talk to your health care professional or pharmacist for more information. Do not breast-feed an infant while taking this medicine. What side effects may I notice from receiving this medicine? Side effects that you should report to your doctor or health care professional as soon as possible: -allergic reactions like skin rash, itching or hives, swelling of the face, lips, or tongue -low blood counts - this medicine may decrease the number of white blood cells, red blood cells and platelets. You may be at increased risk for infections and bleeding. -signs of infection - fever or chills, cough, sore throat, pain or difficulty passing urine -signs of decreased platelets or bleeding - bruising, pinpoint red spots on the skin, black, tarry stools, blood in the urine -signs of decreased red blood cells - unusually weak or tired, fainting spells, lightheadedness -breathing problems -burning, redness or pain at the site of any radiation therapy -changes in skin  color -confusion -mouth sores -pain, tingling, numbness in the hands or feet -seizures -skin ulcers -trouble passing urine or change in the amount of urine -vomiting Side effects that usually do not require medical attention (report to your doctor or health care professional if they continue or are bothersome): -headache -loss of appetite -red color to the face This list may not describe all possible side effects. Call your doctor for medical advice about side effects. You may report side effects to FDA at 1-800-FDA-1088. Where should I keep my medicine? Keep out of the reach of children. Store at room temperature between 15 and 30 degrees C (59 and 86 degrees F). Keep tightly closed. Throw away any unused medicine after the expiration date. NOTE: This sheet is a summary. It may not cover all possible information. If you have questions about this medicine, talk to your doctor, pharmacist, or health care provider.  2015, Elsevier/Gold Standard. (2007-11-02 15:03:29)  

## 2014-06-04 LAB — CBC WITH DIFFERENTIAL/PLATELET
BASOS PCT: 1 % (ref 0–1)
Basophils Absolute: 0.1 10*3/uL (ref 0.0–0.1)
EOS ABS: 0.4 10*3/uL (ref 0.0–0.7)
Eosinophils Relative: 4 % (ref 0–5)
HEMATOCRIT: 22.4 % — AB (ref 39.0–52.0)
HEMOGLOBIN: 7.4 g/dL — AB (ref 13.0–17.0)
LYMPHS ABS: 2.5 10*3/uL (ref 0.7–4.0)
Lymphocytes Relative: 23 % (ref 12–46)
MCH: 29.4 pg (ref 26.0–34.0)
MCHC: 33 g/dL (ref 30.0–36.0)
MCV: 88.9 fL (ref 78.0–100.0)
MONO ABS: 1.6 10*3/uL — AB (ref 0.1–1.0)
MONOS PCT: 15 % — AB (ref 3–12)
MPV: 8.7 fL — ABNORMAL LOW (ref 9.4–12.4)
Neutro Abs: 6.2 10*3/uL (ref 1.7–7.7)
Neutrophils Relative %: 57 % (ref 43–77)
Platelets: 430 10*3/uL — ABNORMAL HIGH (ref 150–400)
RBC: 2.52 MIL/uL — ABNORMAL LOW (ref 4.22–5.81)
RDW: 24 % — ABNORMAL HIGH (ref 11.5–15.5)
WBC: 10.8 10*3/uL — ABNORMAL HIGH (ref 4.0–10.5)

## 2014-06-04 LAB — VITAMIN D 25 HYDROXY (VIT D DEFICIENCY, FRACTURES): Vit D, 25-Hydroxy: 12 ng/mL — ABNORMAL LOW (ref 30–100)

## 2014-06-04 LAB — RETICULOCYTES
ABS Retic: 204.1 10*3/uL — ABNORMAL HIGH (ref 19.0–186.0)
RBC.: 2.52 MIL/uL — AB (ref 4.22–5.81)
RETIC CT PCT: 8.1 % — AB (ref 0.4–2.3)

## 2014-06-05 ENCOUNTER — Encounter: Payer: Self-pay | Admitting: Family Medicine

## 2014-06-05 LAB — HEMOGLOBINOPATHY EVALUATION
Hemoglobin Other: 0 %
Hgb A2 Quant: 3.6 % — ABNORMAL HIGH (ref 2.2–3.2)
Hgb A: 8.6 % — ABNORMAL LOW (ref 96.8–97.8)
Hgb F Quant: 5.9 % — ABNORMAL HIGH (ref 0.0–2.0)
Hgb S Quant: 81.9 % — ABNORMAL HIGH

## 2014-06-06 ENCOUNTER — Telehealth: Payer: Self-pay | Admitting: Family Medicine

## 2014-06-06 DIAGNOSIS — E559 Vitamin D deficiency, unspecified: Secondary | ICD-10-CM | POA: Insufficient documentation

## 2014-06-06 MED ORDER — ERGOCALCIFEROL 1.25 MG (50000 UT) PO CAPS: 50000.0000 [IU] | ORAL_CAPSULE | ORAL | Status: DC

## 2014-06-06 NOTE — Telephone Encounter (Signed)
Reviewed labs, vitamin D deficiency. Will add weekly vitamin D. Will re-check labs at follow up appointment.  Meds ordered this encounter  Medications  . ergocalciferol (VITAMIN D2) 50000 UNITS capsule    Sig: Take 1 capsule (50,000 Units total) by mouth once a week.    Dispense:  4 capsule    Refill:  3    Order Specific Question:  Supervising Provider    Answer:  Marthann SchillerMATTHEWS, MICHELLE A [[id number]]   Massie MaroonHollis,Fred Franzen M, FNP

## 2014-06-09 NOTE — Telephone Encounter (Signed)
Patient advised of low vitamin D levels and to start Medication as directed. Patient verbalized understanding. Thanks!

## 2014-07-05 IMAGING — CR DG CHEST 2V
2 series · 2 of 2 positions shown · non-contrast
Comparison: None.

CLINICAL DATA: Chest pain today. History of sickle cell

EXAM:
CHEST  2 VIEW

[w chest pa]
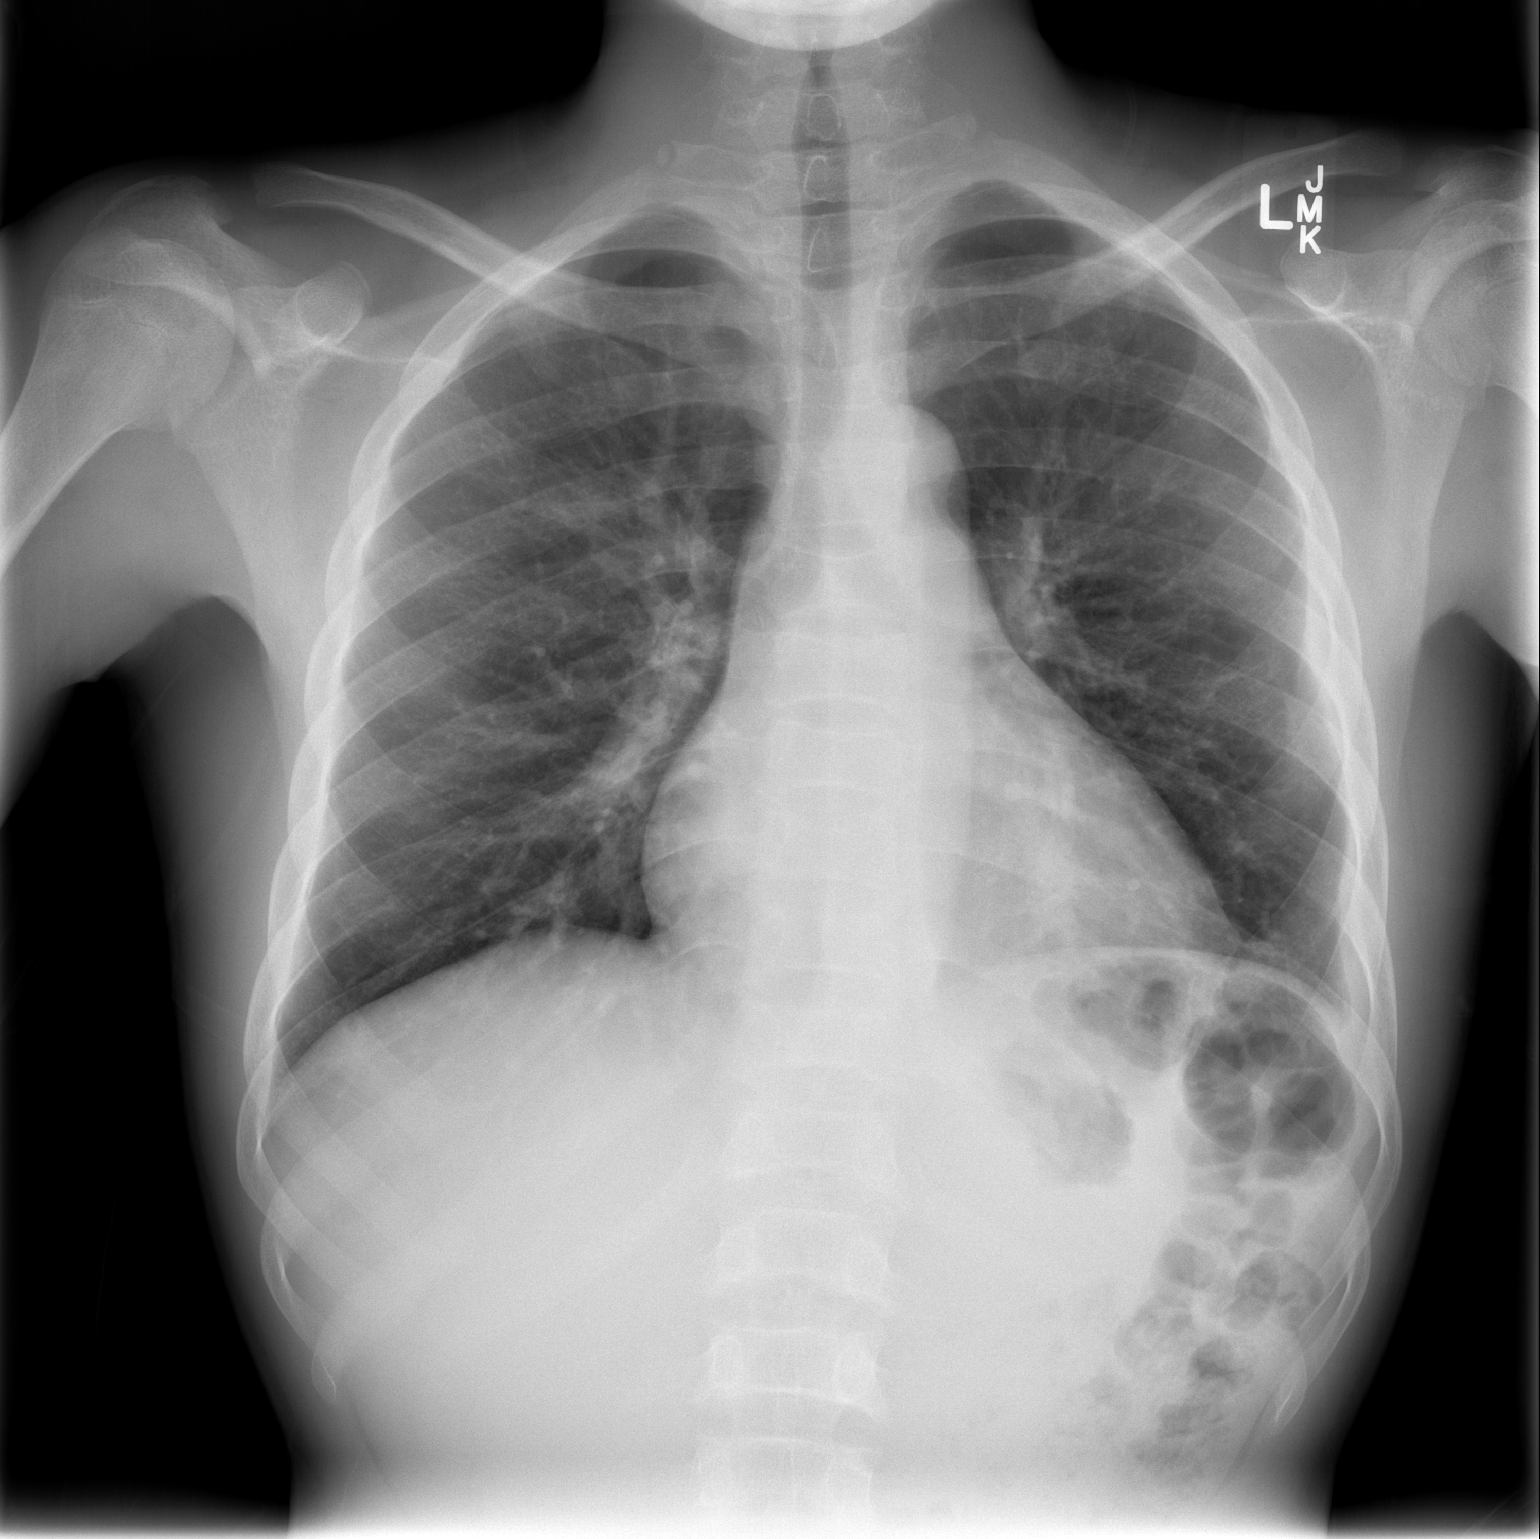

[w chest lat]
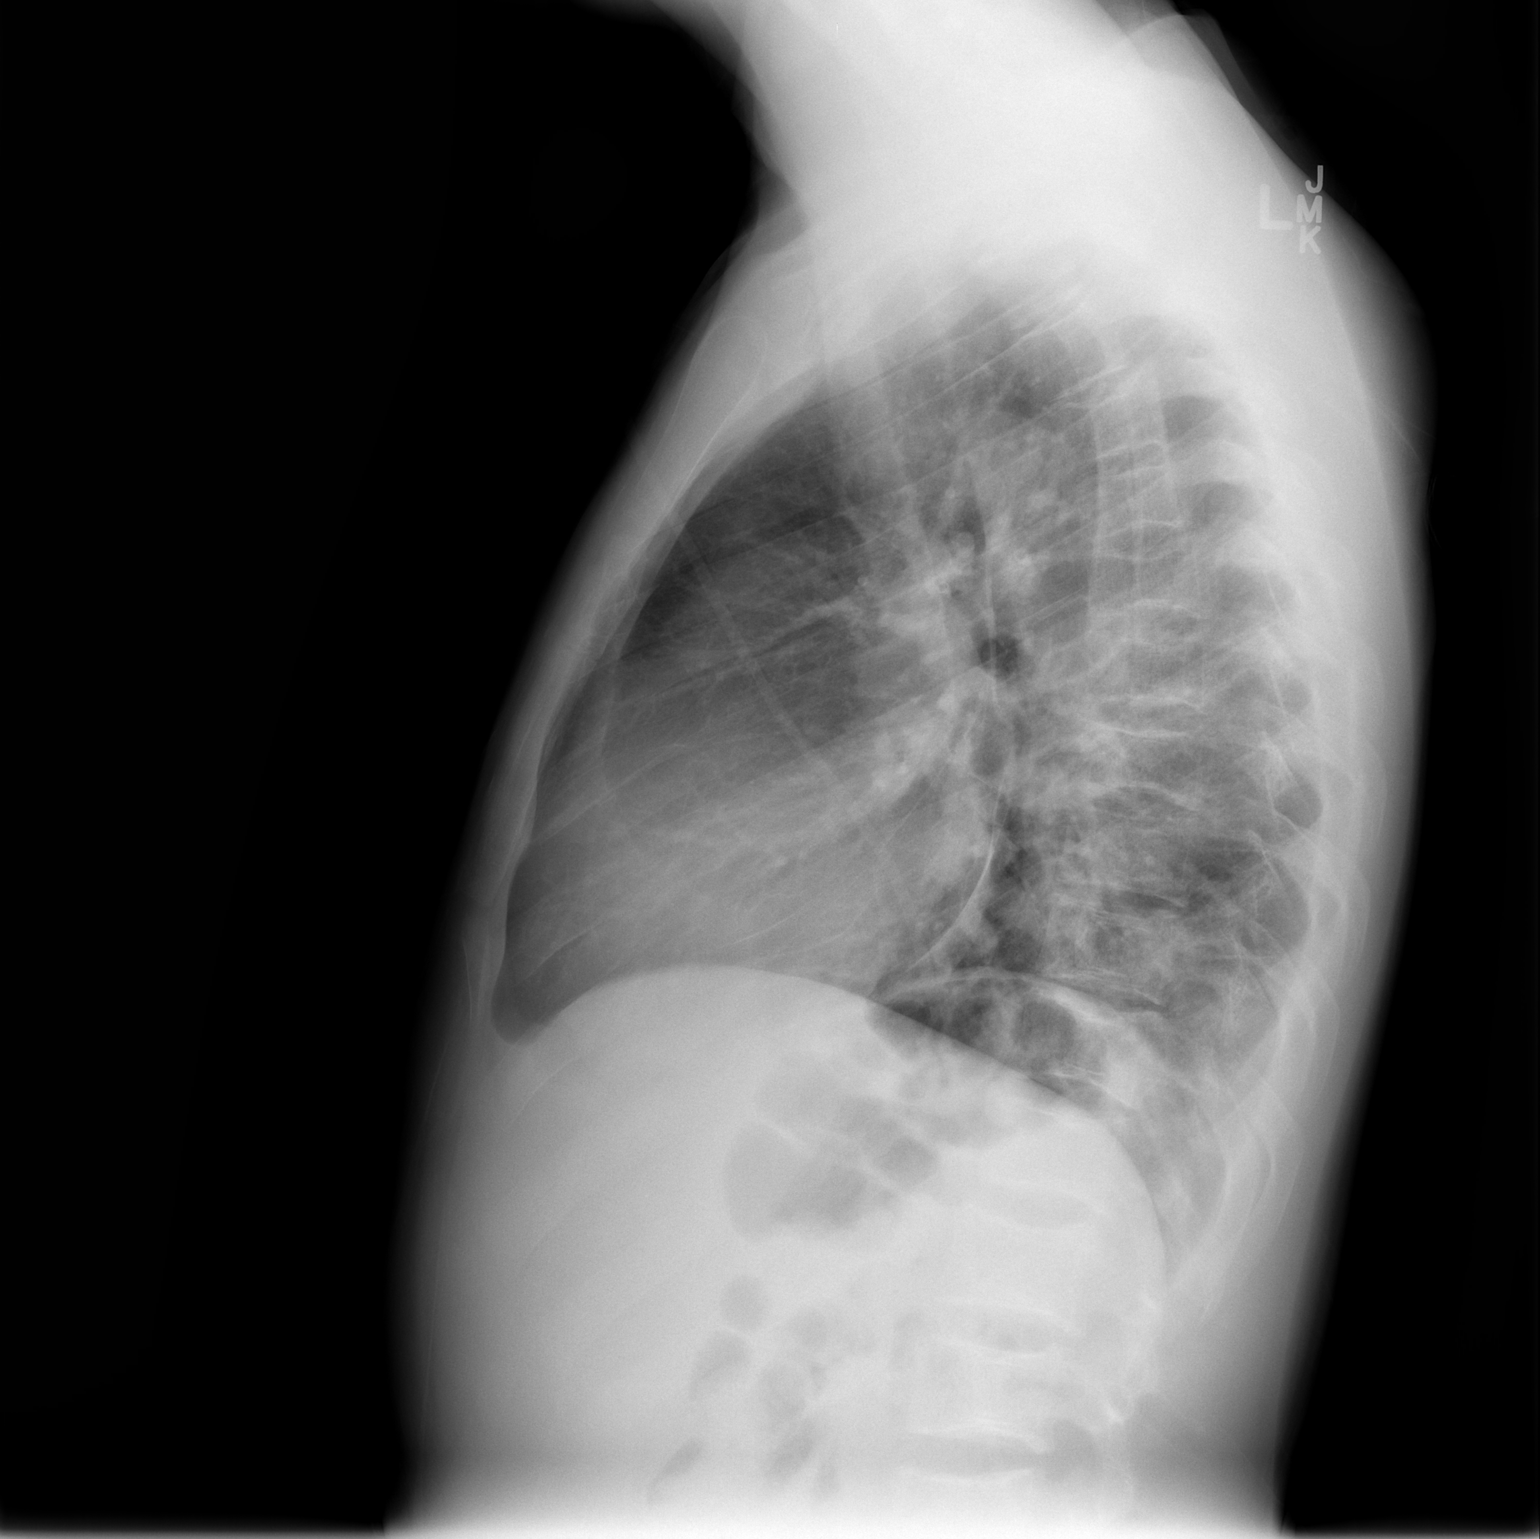

[2 of 2 positions shown; findings below may reference images not displayed]

FINDINGS: The heart size and mediastinal contours are within normal limits.
Both lungs are clear. The visualized skeletal structures are
unremarkable.
IMPRESSION: No active cardiopulmonary disease.

## 2014-07-08 DIAGNOSIS — H6993 Unspecified Eustachian tube disorder, bilateral: Secondary | ICD-10-CM | POA: Insufficient documentation

## 2014-09-04 ENCOUNTER — Ambulatory Visit: Payer: Self-pay | Admitting: Internal Medicine

## 2014-10-07 DIAGNOSIS — J353 Hypertrophy of tonsils with hypertrophy of adenoids: Secondary | ICD-10-CM | POA: Insufficient documentation

## 2015-04-28 ENCOUNTER — Encounter: Payer: Self-pay | Admitting: Internal Medicine

## 2015-04-28 ENCOUNTER — Ambulatory Visit (INDEPENDENT_AMBULATORY_CARE_PROVIDER_SITE_OTHER): Payer: BLUE CROSS/BLUE SHIELD | Admitting: Internal Medicine

## 2015-04-28 VITALS — BP 102/85 | HR 70 | Temp 97.5°F | Resp 18 | Ht 73.0 in | Wt 142.0 lb

## 2015-04-28 DIAGNOSIS — D571 Sickle-cell disease without crisis: Secondary | ICD-10-CM

## 2015-04-28 LAB — COMPLETE METABOLIC PANEL WITH GFR
ALBUMIN: 4.4 g/dL (ref 3.6–5.1)
ALT: 30 U/L (ref 8–46)
AST: 34 U/L — AB (ref 12–32)
Alkaline Phosphatase: 86 U/L (ref 48–230)
BILIRUBIN TOTAL: 2.6 mg/dL — AB (ref 0.2–1.1)
BUN: 10 mg/dL (ref 7–20)
CO2: 24 mmol/L (ref 20–31)
Calcium: 9.4 mg/dL (ref 8.9–10.4)
Chloride: 103 mmol/L (ref 98–110)
Creat: 0.57 mg/dL — ABNORMAL LOW (ref 0.60–1.26)
GFR, Est African American: >89 mL/min (ref >=60)
GFR, Est Non African American: >89 mL/min (ref >=60)
GLUCOSE: 89 mg/dL (ref 65–99)
POTASSIUM: 4.5 mmol/L (ref 3.8–5.1)
SODIUM: 136 mmol/L (ref 135–146)
TOTAL PROTEIN: 7.6 g/dL (ref 6.3–8.2)

## 2015-04-28 MED ORDER — HYDROXYUREA 500 MG PO CAPS: 500.0000 mg | ORAL_CAPSULE | Freq: Two times a day (BID) | ORAL | Status: DC

## 2015-04-28 MED ORDER — OXYCODONE HCL 10 MG PO TABS: 10.0000 mg | ORAL_TABLET | Freq: Four times a day (QID) | ORAL | Status: DC | PRN

## 2015-04-28 MED ORDER — FOLIC ACID 1 MG PO TABS: 1.0000 mg | ORAL_TABLET | Freq: Every day | ORAL | Status: DC

## 2015-04-28 MED ORDER — MORPHINE SULFATE ER 15 MG PO TBCR: 15.0000 mg | EXTENDED_RELEASE_TABLET | Freq: Two times a day (BID) | ORAL | Status: DC

## 2015-04-28 NOTE — Progress Notes (Signed)
Patient is here to re-establish care.  Patient complains of pain in left arm present for three days, pain described as throbbing and sore on forearm.Pain is scaled at a 4 right now. Patient states extending his arm gives relief.  Patient needs refill on Hydroxyurea and MS Contin.  Patient is taking Oxycontin 10mg  every 12 hours and Oxycondone HCL 5mg  2 tablets q4h prn. Patient needs refills. Patient has been out of medication for a week.  Patient has not eaten (drank fruit juice) and has not taken his medications.

## 2015-04-28 NOTE — Patient Instructions (Signed)
Sickle Cell Anemia, Adult Sickle cell anemia is a condition in which red blood cells have an abnormal "sickle" shape. This abnormal shape shortens the cells' life span, which results in a lower than normal concentration of red blood cells in the blood. The sickle shape also causes the cells to clump together and block free blood flow through the blood vessels. As a result, the tissues and organs of the body do not receive enough oxygen. Sickle cell anemia causes organ damage and pain and increases the risk of infection. CAUSES  Sickle cell anemia is a genetic disorder. Those who receive two copies of the gene have the condition, and those who receive one copy have the trait. RISK FACTORS The sickle cell gene is most common in people whose families originated in [place]. Other areas of the globe where sickle cell trait occurs include the Mediterranean, South and [place], the [place], and the [place].  SIGNS AND SYMPTOMS  Pain, especially in the extremities, back, chest, or abdomen (common). The pain may start suddenly or may develop following an illness, especially if there is dehydration. Pain can also occur due to overexertion or exposure to extreme temperature changes.  Frequent severe bacterial infections, especially certain types of pneumonia and meningitis.  Pain and swelling in the hands and feet.  Decreased activity.   Loss of appetite.   Change in behavior.  Headaches.  Seizures.  Shortness of breath or difficulty breathing.  Vision changes.  Skin ulcers. Those with the trait may not have symptoms or they may have mild symptoms.  DIAGNOSIS  Sickle cell anemia is diagnosed with blood tests that demonstrate the genetic trait. It is often diagnosed during the newborn period, due to mandatory testing nationwide. A variety of blood tests, X-rays, CT scans, MRI scans, ultrasounds, and lung function tests may also be done to monitor the condition. TREATMENT  Sickle  cell anemia may be treated with:  Medicines. You may be given pain medicines, antibiotic medicines (to treat and prevent infections) or medicines to increase the production of certain types of hemoglobin.  Fluids.  Oxygen.  Blood transfusions. HOME CARE INSTRUCTIONS   Drink enough fluid to keep your urine clear or pale yellow. Increase your fluid intake in hot weather and during exercise.  Do not smoke. Smoking lowers oxygen levels in the blood.   Only take over-the-counter or prescription medicines for pain, fever, or discomfort as directed by your health care provider.  Take antibiotics as directed by your health care provider. Make sure you finish them it even if you start to feel better.   Take supplements as directed by your health care provider.   Consider wearing a medical alert bracelet. This tells anyone caring for you in an emergency of your condition.   When traveling, keep your medical information, health care provider's names, and the medicines you take with you at all times.   If you develop a fever, do not take medicines to reduce the fever right away. This could cover up a problem that is developing. Notify your health care provider.  Keep all follow-up appointments with your health care provider. Sickle cell anemia requires regular medical care. SEEK MEDICAL CARE IF: You have a fever. SEEK IMMEDIATE MEDICAL CARE IF:   You feel dizzy or faint.   You have new abdominal pain, especially on the left side near the stomach area.   You develop a persistent, often uncomfortable and painful penile erection (priapism). If this is not treated immediately it   will lead to impotence.   You have numbness your arms or legs or you have a hard time moving them.   You have a hard time with speech.   You have a fever or persistent symptoms for more than 2-3 days.   You have a fever and your symptoms suddenly get worse.   You have signs or symptoms of infection.  These include:   Chills.   Abnormal tiredness (lethargy).   Irritability.   Poor eating.   Vomiting.   You develop pain that is not helped with medicine.   You develop shortness of breath.  You have pain in your chest.   You are coughing up pus-like or bloody sputum.   You develop a stiff neck.  Your feet or hands swell or have pain.  Your abdomen appears bloated.  You develop joint pain. MAKE SURE YOU:  Understand these instructions.   This information is not intended to replace advice given to you by your health care provider. Make sure you discuss any questions you have with your health care provider.   Document Released: 09/28/2005 Document Revised: 07/11/2014 Document Reviewed: 01/30/2013 Elsevier Interactive Patient Education 2016 Elsevier Inc.  

## 2015-04-28 NOTE — Progress Notes (Signed)
Patient ID: Matthew Case Brosseau, male   DOB: Dec 02, 1995, 19 y.o.   MRN: [id number]   Matthew Case Bullen, is a 19 y.o. male  [id number]  [id number]  DOB - Dec 02, 1995  CC:  Chief Complaint  Patient presents with  . New Patient (Initial Visit)       HPI: Matthew Case Elk is a 19 y.o. male here today to re-establish medical care. Patient has history of sickle cell disease, traveled to [place] semester, [place], here today to reestablish medical care after almost 1 year. Patient is currently on MS Contin 15 mg tablet by mouth twice a day and oxycodone 10 mg tablet every 6 hours when necessary breakthrough pain. He also takes folic acid and Hydrea daily. He said he is compliant with medications, has not had any sickle cell crisis since he came back from [place]. He complains of ongoing baseline bone pain especially upper limbs, rated at about 3-4. He is Training and development officerstudying design engineering at [employer], has not missed any class for about 2 months. Patient is doing well and he plans to continue taking his medications. He smokes marijuana occasionally, drinks alcohol occasionally. Patient has No headache, No chest pain, No abdominal pain - No Nausea, No new weakness tingling or numbness, No Cough - SOB.  No Known Allergies Past Medical History  Diagnosis Date  . Sickle cell disease (HCC)    Current Outpatient Prescriptions on File Prior to Visit  Medication Sig Dispense Refill  . ibuprofen (ADVIL,MOTRIN) 600 MG tablet Take 1 tablet (600 mg total) by mouth every 6 (six) hours as needed for mild pain. 30 tablet 2  . Multiple Vitamin (MULTIVITAMIN WITH MINERALS) TABS tablet Take 1 tablet by mouth daily. 30 tablet 0  . ergocalciferol (VITAMIN D2) 50000 UNITS capsule Take 1 capsule (50,000 Units total) by mouth once a week. (Patient not taking: Reported on 04/28/2015) 4 capsule 3   No current facility-administered medications on file prior to visit.    Family History  Problem Relation Age of Onset  . Diabetes Maternal Grandmother   . Hypertension Maternal Grandmother   . Hypertension Maternal Grandfather   . Diabetes Maternal Grandfather   . Hypertension Mother    Social History   Social History  . Marital Status: Single    Spouse Name: N/A  . Number of Children: N/A  . Years of Education: N/A   Occupational History  . Not on file.   Social History Main Topics  . Smoking status: Never Smoker   . Smokeless tobacco: Never Used  . Alcohol Use: No  . Drug Use: Yes    Special: Marijuana  . Sexual Activity: Yes    Birth Control/ Protection: Condom   Other Topics Concern  . Not on file   Social History Narrative    Review of Systems: Constitutional: Negative for fever, chills, diaphoresis, activity change, appetite change and fatigue. HENT: Negative for ear pain, nosebleeds, congestion, facial swelling, rhinorrhea, neck pain, neck stiffness and ear discharge.  Eyes: Negative for pain, discharge, redness, itching and visual disturbance. Respiratory: Negative for cough, choking, chest tightness, shortness of breath, wheezing and stridor.  Cardiovascular: Negative for chest pain, palpitations and leg swelling. Gastrointestinal: Negative for abdominal distention. Genitourinary: Negative for dysuria, urgency, frequency, hematuria, flank pain, decreased urine volume, difficulty urinating and dyspareunia.  Musculoskeletal: Negative for joint swelling and gait problem. Neurological: Negative for dizziness, tremors, seizures, syncope, facial asymmetry, speech difficulty, weakness, light-headedness, numbness and headaches.  Hematological:  Negative for adenopathy. Does not bruise/bleed easily. Psychiatric/Behavioral: Negative for hallucinations, behavioral problems, confusion, dysphoric mood, decreased concentration and agitation.    Objective:   Filed Vitals:   04/28/15 0825  BP: 102/85  Pulse: 70  Temp: 97.5 F (36.4 C)   Resp: 18    Physical Exam: Constitutional: Patient appears well-developed and well-nourished. No distress. HENT: Normocephalic, atraumatic, External right and left ear normal. Oropharynx is clear and moist.  Eyes: Conjunctivae and EOM are normal. PERRLA, no scleral icterus. Neck: Normal ROM. Neck supple. No JVD. No tracheal deviation. No thyromegaly. CVS: RRR, S1/S2 +, no murmurs, no gallops, no carotid bruit.  Pulmonary: Effort and breath sounds normal, no stridor, rhonchi, wheezes, rales.  Abdominal: Soft. BS +, no distension, tenderness, rebound or guarding.  Musculoskeletal: Normal range of motion. No edema and no tenderness.  Lymphadenopathy: No lymphadenopathy noted, cervical, inguinal or axillary Neuro: Alert. Normal reflexes, muscle tone coordination. No cranial nerve deficit. Skin: Skin is warm and dry. No rash noted. Not diaphoretic. No erythema. No pallor. Psychiatric: Normal mood and affect. Behavior, judgment, thought content normal.  Lab Results  Component Value Date   WBC 10.8* 06/03/2014   HGB 7.4* 06/03/2014   HCT 22.4* 06/03/2014   MCV 88.9 06/03/2014   PLT 430* 06/03/2014   Lab Results  Component Value Date   CREATININE 0.61 06/03/2014   BUN 8 06/03/2014   NA 140 06/03/2014   K 4.4 06/03/2014   CL 105 06/03/2014   CO2 25 06/03/2014    No results found for: HGBA1C Lipid Panel  No results found for: CHOL, TRIG, HDL, CHOLHDL, VLDL, LDLCALC     Assessment and plan:   1. Sickle cell disease, type SS (HCC)  Refill:  - folic acid (FOLVITE) 1 MG tablet; Take 1 tablet (1 mg total) by mouth daily.  Dispense: 90 tablet; Refill: 3 - hydroxyurea (HYDREA) 500 MG capsule; Take 1 capsule (500 mg total) by mouth 2 (two) times daily.  Dispense: 60 capsule; Refill: 5 - Oxycodone HCl 10 MG TABS; Take 1 tablet (10 mg total) by mouth 4 (four) times daily as needed (moderate or severe pain).  Dispense: 60 tablet; Refill: 0 - CBC with Differential/Platelet - COMPLETE  METABOLIC PANEL WITH GFR - Vit D  25 hydroxy (rtn osteoporosis monitoring)  General Management: a. Sickle cell disease - Continue Hydrea. We discussed the need for good hydration, monitoring of hydration status, avoidance of heat, cold, stress, and infection triggers. We discussed the risks and benefits of Hydrea, including bone marrow suppression, the possibility of GI upset, skin ulcers, hair thinning, and teratogenicity. The patient was reminded of the need to seek medical attention of any symptoms of bleeding, anemia, or infection. Continue folic acid 1 mg daily to prevent aplastic bone marrow crises.   b. Pulmonary evaluation - Patient denies severe recurrent wheezes, shortness of breath with exercise, or persistent cough. If these symptoms develop, pulmonary function tests with spirometry will be ordered, and if abnormal, plan on referral to Pulmonology for further evaluation.  c. Cardiac - Routine screening for pulmonary hypertension is not recommended.  d. Eye - High risk of proliferative retinopathy. Annual eye exam with retinal exam recommended to patient, the patient has had eye exam this year.  e. Immunization status - Influenza vaccination given today, patient is up to date with Meningococcal and Pneumococcal vaccines.   f. Acute and chronic painful episodes - We agreed on Opiate dose and amount of pills  per month. We discussed that  pt is to receive Schedule II prescriptions only from our clinic. Pt is also aware that the prescription history is available to [place] online through the [facility] CSRS. Controlled substance agreement reviewed and signed. We reminded Shepherd that all patients receiving Schedule II narcotics must be seen for follow within one month of prescription being requested. We reviewed the terms of our pain agreement, including the need to keep medicines in a safe locked location away from children or pets, and the need to report excess sedation or constipation, measures to avoid  constipation, and policies related to early refills and stolen prescriptions. According to the [place] Chronic Pain Initiative program, we have reviewed details related to analgesia, adverse effects and aberrant behaviors.   Deanna was counseled on the dangers of tobacco use, and was advised to quit. Reviewed strategies to maximize success, including removing cigarettes and smoking materials from environment, stress management and support of family/friends.  The above recommendations are taken from the NIH Evidence-Based Management of Sickle Cell Disease: Expert Panel Report, 40981.   Return in about 2 months (around 06/28/2015) for Sickle Cell Disease/Pain.  The patient was given clear instructions to go to ER or return to medical center if symptoms don't improve, worsen or new problems develop. The patient verbalized understanding. The patient was told to call to get lab results if they haven't heard anything in the next week.     This note has been created with Education officer, environmental. Any transcriptional errors are unintentional.    Jeanann Lewandowsky, MD, MHA, [facility] Caul, CPE [facility] [place], [place] [phone]   04/28/2015, 9:20 AM

## 2015-04-29 ENCOUNTER — Other Ambulatory Visit: Payer: Self-pay | Admitting: Internal Medicine

## 2015-04-29 ENCOUNTER — Telehealth: Payer: Self-pay | Admitting: *Deleted

## 2015-04-29 DIAGNOSIS — E559 Vitamin D deficiency, unspecified: Secondary | ICD-10-CM

## 2015-04-29 LAB — CBC WITH DIFFERENTIAL/PLATELET
Basophils Absolute: 0.1 10*3/uL (ref 0.0–0.1)
Basophils Relative: 1 % (ref 0–1)
EOS ABS: 0.4 10*3/uL (ref 0.0–0.7)
Eosinophils Relative: 5 % (ref 0–5)
HCT: 26.1 % — ABNORMAL LOW (ref 39.0–52.0)
Hemoglobin: 8.7 g/dL — ABNORMAL LOW (ref 13.0–17.0)
Lymphocytes Relative: 31 % (ref 12–46)
Lymphs Abs: 2.2 10*3/uL (ref 0.7–4.0)
MCH: 34.3 pg — ABNORMAL HIGH (ref 26.0–34.0)
MCHC: 33.3 g/dL (ref 30.0–36.0)
MCV: 102.8 fL — ABNORMAL HIGH (ref 78.0–100.0)
MONOS PCT: 19 % — AB (ref 3–12)
MPV: 8.6 fL (ref 8.6–12.4)
Monocytes Absolute: 1.3 10*3/uL — ABNORMAL HIGH (ref 0.1–1.0)
Neutro Abs: 3.1 10*3/uL (ref 1.7–7.7)
Neutrophils Relative %: 44 % (ref 43–77)
Platelets: 533 10*3/uL — ABNORMAL HIGH (ref 150–400)
RBC: 2.54 MIL/uL — ABNORMAL LOW (ref 4.22–5.81)
RDW: 15.8 % — ABNORMAL HIGH (ref 11.5–15.5)
WBC: 7.1 10*3/uL (ref 4.0–10.5)

## 2015-04-29 LAB — VITAMIN D 25 HYDROXY (VIT D DEFICIENCY, FRACTURES): VIT D 25 HYDROXY: 16 ng/mL — AB (ref 30–100)

## 2015-04-29 MED ORDER — ERGOCALCIFEROL 1.25 MG (50000 UT) PO CAPS: 50000.0000 [IU] | ORAL_CAPSULE | ORAL | Status: DC

## 2015-04-29 NOTE — Telephone Encounter (Signed)
-----   Message from Quentin Angstlugbemiga E Jegede, MD sent at 04/29/2015 11:07 AM EDT ----- Please inform patient that his hemoglobin is stable. Vitamin D level is very low and need to be replaced. Other results are mostly normal. Vitamin D prescription sent to [facility].

## 2015-04-29 NOTE — Telephone Encounter (Signed)
Patient verified DOB Patient informed of his lab results showing a stable hemoglobin level. Patient was advised of his Low Vitamin D level and the need for it to be replaced. Patient informed of [facility] being [facility] to [facility]. Patient stated he received a prescription notification from [facility] stating he had a medication to pickup. Medical Assistant advised patient to pickup prescription as soon as possible and to begin taking them as prescribed. Patient expressed his understanding and had no further questions.

## 2015-06-08 ENCOUNTER — Other Ambulatory Visit: Payer: Self-pay | Admitting: Family Medicine

## 2015-06-08 DIAGNOSIS — D571 Sickle-cell disease without crisis: Secondary | ICD-10-CM

## 2015-06-08 MED ORDER — HYDROXYUREA 500 MG PO CAPS: 500.0000 mg | ORAL_CAPSULE | Freq: Two times a day (BID) | ORAL | Status: DC

## 2015-06-08 MED ORDER — FOLIC ACID 1 MG PO TABS: 1.0000 mg | ORAL_TABLET | Freq: Every day | ORAL | Status: DC

## 2015-06-08 NOTE — Telephone Encounter (Signed)
Refill request for oxycodone 10mg. LOV 04/28/2015. Please advise. Thanks!   

## 2015-06-09 ENCOUNTER — Other Ambulatory Visit: Payer: Self-pay | Admitting: Internal Medicine

## 2015-06-09 DIAGNOSIS — D571 Sickle-cell disease without crisis: Secondary | ICD-10-CM

## 2015-06-09 MED ORDER — OXYCODONE HCL 10 MG PO TABS: 10.0000 mg | ORAL_TABLET | Freq: Four times a day (QID) | ORAL | Status: DC | PRN

## 2015-06-09 MED ORDER — HYDROXYUREA 500 MG PO CAPS: 500.0000 mg | ORAL_CAPSULE | Freq: Two times a day (BID) | ORAL | Status: DC

## 2015-06-11 ENCOUNTER — Encounter: Payer: Self-pay | Admitting: Family Medicine

## 2015-06-11 ENCOUNTER — Ambulatory Visit (INDEPENDENT_AMBULATORY_CARE_PROVIDER_SITE_OTHER): Payer: BC Managed Care – PPO | Admitting: Family Medicine

## 2015-06-11 VITALS — BP 120/65 | HR 63 | Temp 98.1°F | Ht 73.0 in | Wt 143.0 lb

## 2015-06-11 DIAGNOSIS — D571 Sickle-cell disease without crisis: Secondary | ICD-10-CM | POA: Diagnosis not present

## 2015-06-11 LAB — POCT URINALYSIS DIP (DEVICE)
Bilirubin Urine: NEGATIVE
GLUCOSE, UA: NEGATIVE mg/dL
KETONES UR: NEGATIVE mg/dL
LEUKOCYTES UA: NEGATIVE
Nitrite: NEGATIVE
Protein, ur: NEGATIVE mg/dL
SPECIFIC GRAVITY, URINE: 1.015 (ref 1.005–1.030)
UROBILINOGEN UA: 0.2 mg/dL (ref 0.0–1.0)
pH: 5.5 (ref 5.0–8.0)

## 2015-06-11 MED ORDER — OXYCODONE HCL 10 MG PO TABS: 10.0000 mg | ORAL_TABLET | Freq: Four times a day (QID) | ORAL | Status: DC | PRN

## 2015-06-11 MED ORDER — OXYCODONE HCL ER 10 MG PO T12A: 10.0000 mg | EXTENDED_RELEASE_TABLET | Freq: Two times a day (BID) | ORAL | Status: DC

## 2015-06-11 NOTE — Patient Instructions (Signed)
Sickle Cell Anemia, Adult Sickle cell anemia is a condition in which red blood cells have an abnormal "sickle" shape. This abnormal shape shortens the cells' life span, which results in a lower than normal concentration of red blood cells in the blood. The sickle shape also causes the cells to clump together and block free blood flow through the blood vessels. As a result, the tissues and organs of the body do not receive enough oxygen. Sickle cell anemia causes organ damage and pain and increases the risk of infection. CAUSES  Sickle cell anemia is a genetic disorder. Those who receive two copies of the gene have the condition, and those who receive one copy have the trait. RISK FACTORS The sickle cell gene is most common in people whose families originated in [place]. Other areas of the globe where sickle cell trait occurs include the Mediterranean, South and [place], the [place], and the [place].  SIGNS AND SYMPTOMS  Pain, especially in the extremities, back, chest, or abdomen (common). The pain may start suddenly or may develop following an illness, especially if there is dehydration. Pain can also occur due to overexertion or exposure to extreme temperature changes.  Frequent severe bacterial infections, especially certain types of pneumonia and meningitis.  Pain and swelling in the hands and feet.  Decreased activity.   Loss of appetite.   Change in behavior.  Headaches.  Seizures.  Shortness of breath or difficulty breathing.  Vision changes.  Skin ulcers. Those with the trait may not have symptoms or they may have mild symptoms.  DIAGNOSIS  Sickle cell anemia is diagnosed with blood tests that demonstrate the genetic trait. It is often diagnosed during the newborn period, due to mandatory testing nationwide. A variety of blood tests, X-rays, CT scans, MRI scans, ultrasounds, and lung function tests may also be done to monitor the condition. TREATMENT  Sickle  cell anemia may be treated with:  Medicines. You may be given pain medicines, antibiotic medicines (to treat and prevent infections) or medicines to increase the production of certain types of hemoglobin.  Fluids.  Oxygen.  Blood transfusions. HOME CARE INSTRUCTIONS   Drink enough fluid to keep your urine clear or pale yellow. Increase your fluid intake in hot weather and during exercise.  Do not smoke. Smoking lowers oxygen levels in the blood.   Only take over-the-counter or prescription medicines for pain, fever, or discomfort as directed by your health care provider.  Take antibiotics as directed by your health care provider. Make sure you finish them it even if you start to feel better.   Take supplements as directed by your health care provider.   Consider wearing a medical alert bracelet. This tells anyone caring for you in an emergency of your condition.   When traveling, keep your medical information, health care provider's names, and the medicines you take with you at all times.   If you develop a fever, do not take medicines to reduce the fever right away. This could cover up a problem that is developing. Notify your health care provider.  Keep all follow-up appointments with your health care provider. Sickle cell anemia requires regular medical care. SEEK MEDICAL CARE IF: You have a fever. SEEK IMMEDIATE MEDICAL CARE IF:   You feel dizzy or faint.   You have new abdominal pain, especially on the left side near the stomach area.   You develop a persistent, often uncomfortable and painful penile erection (priapism). If this is not treated immediately it   will lead to impotence.   You have numbness your arms or legs or you have a hard time moving them.   You have a hard time with speech.   You have a fever or persistent symptoms for more than 2-3 days.   You have a fever and your symptoms suddenly get worse.   You have signs or symptoms of infection.  These include:   Chills.   Abnormal tiredness (lethargy).   Irritability.   Poor eating.   Vomiting.   You develop pain that is not helped with medicine.   You develop shortness of breath.  You have pain in your chest.   You are coughing up pus-like or bloody sputum.   You develop a stiff neck.  Your feet or hands swell or have pain.  Your abdomen appears bloated.  You develop joint pain. MAKE SURE YOU:  Understand these instructions.   This information is not intended to replace advice given to you by your health care provider. Make sure you discuss any questions you have with your health care provider.   Document Released: 09/28/2005 Document Revised: 07/11/2014 Document Reviewed: 01/30/2013 Elsevier Interactive Patient Education 2016 Elsevier Inc.  

## 2015-06-11 NOTE — Progress Notes (Signed)
Subjective:    Patient ID: Matthew Case, male    DOB: 03-Sep-1995, 19 y.o.   MRN: [id number]  HPI Matthew Case, a 19 year old patient with a history of sickle cell anemia, HbSS presents for a 3 month follow up of sickle cell anemia. Patient states that he has been experiencing increased pain over the past week. He is unable to take MS Contin, long acting medication. He states that he was previously on Oxycontin every 12 hours per hematologist, which was well tolerated. Current pain intensity is 5/10 primarily to low back. He denies fever, headache, shortness of breath, chest pain, nausea, vomiting, or diarrhea. He states that he is taking Hydroxyurea and folic acid consistently.  Past Medical History  Diagnosis Date  . Sickle cell disease (HCC)    Immunization History  Administered Date(s) Administered  . Influenza,inj,Quad PF,36+ Mos 04/04/2014  . Pneumococcal Polysaccharide-23 04/04/2014   Social History   Social History  . Marital Status: Single    Spouse Name: N/A  . Number of Children: N/A  . Years of Education: N/A   Occupational History  . Not on file.   Social History Main Topics  . Smoking status: Never Smoker   . Smokeless tobacco: Never Used  . Alcohol Use: No  . Drug Use: Yes    Special: Marijuana  . Sexual Activity: Yes    Birth Control/ Protection: Condom   Other Topics Concern  . Not on file   Social History Narrative   Review of Systems  Constitutional: Negative for fever, fatigue and unexpected weight change.  HENT: Negative.   Eyes: Negative.   Respiratory: Negative.   Cardiovascular: Negative.   Gastrointestinal: Negative.   Endocrine: Negative.   Genitourinary: Negative.   Musculoskeletal: Positive for myalgias and back pain.  Skin: Negative.   Allergic/Immunologic: Negative.   Neurological: Negative.  Negative for dizziness and headaches.  Hematological: Negative.   Psychiatric/Behavioral: Negative.       Objective:   Physical Exam   Constitutional: He is oriented to person, place, and time.  HENT:  Head: Normocephalic and atraumatic.  Right Ear: Hearing, tympanic membrane, external ear and ear canal normal.  Left Ear: Hearing, tympanic membrane, external ear and ear canal normal.  Nose: Nose normal.  Mouth/Throat: Oropharynx is clear and moist.  Eyes: Conjunctivae and EOM are normal. Pupils are equal, round, and reactive to light.  Neck: Normal range of motion. Neck supple.  Cardiovascular: Normal rate, regular rhythm, normal heart sounds and intact distal pulses.   Pulmonary/Chest: Effort normal and breath sounds normal.  Abdominal: Soft. Bowel sounds are normal.  Musculoskeletal: Normal range of motion.  Neurological: He is alert and oriented to person, place, and time. He has normal reflexes.  Skin: Skin is warm and dry.  Psychiatric: He has a normal mood and affect. His behavior is normal. Judgment and thought content normal.     BP 120/65 mmHg  Pulse 63  Temp(Src) 98.1 F (36.7 C)  Ht 6\' 1"  (1.854 m)  Wt 143 lb (64.864 kg)  BMI 18.87 kg/m2 Assessment & Plan:  1. Sickle cell disease, type SS (HCC)  Sickle cell disease - Continue Hydrea 500 mg BID. We discussed the need for good hydration, monitoring of hydration status, avoidance of heat, cold, stress, and infection triggers. We discussed the risks and benefits of Hydrea, including bone marrow suppression, the possibility of GI upset, skin ulcers, hair thinning, and teratogenicity. The patient was reminded of the need to seek medical  attention of any symptoms of bleeding, anemia, or infection. Continue folic acid 1 mg daily to prevent aplastic bone marrow crises.   Pulmonary evaluation - Patient denies severe recurrent wheezes, shortness of breath with exercise, or persistent cough. If these symptoms develop, pulmonary function tests with spirometry will be ordered, and if abnormal, plan on referral to Pulmonology for further evaluation.   Cardiac - Routine  screening for pulmonary hypertension is not recommended.   Eye - High risk of proliferative retinopathy. Annual eye exam with retinal exam recommended to patient. Patient states that he will get eye examination in [place] over Christmas break.   Immunization status - He is up to day with immunizations.   Acute and chronic painful episodes - We agreed on Oxycontin 10 mg every 12 hours for chronic pain. We will also continue Oxycodone 10 mg every 4 hours as needed for moderate to severe pain. We will titrate medication dosage in the spring. We discussed that pt is to receive her Schedule II prescriptions only from [place]. Pt is also aware that the prescription history is available to [place] online through the [facility] CSRS. Controlled substance agreement signed previously. We reminded Mr. Meinecke that all patients receiving Schedule II narcotics must be seen for follow within one month of prescription being requested. We reviewed the terms of our pain agreement, including the need to keep medicines in a safe locked location away from children or pets, and the need to report excess sedation or constipation, measures to avoid constipation, and policies related to early refills and stolen prescriptions. According to the [place] Chronic Pain Initiative program, we have reviewed details related to analgesia, adverse effects, aberrant behaviors. Reviewed [place] Substance Reporting system prior to reorder, no inconsistencies noted.     Vitamin D deficiency - Drisdol 50,000 units weekly, encouraged to take medication.   - oxyCODONE (OXYCONTIN) 10 mg 12 hr tablet; Take 1 tablet (10 mg total) by mouth every 12 (twelve) hours.  Dispense: 60 tablet; Refill: 0 - Oxycodone HCl 10 MG TABS; Take 1 tablet (10 mg total) by mouth 4 (four) times daily as needed (moderate or severe pain).  Dispense: 60 tablet; Refill: 0 - Prescription Monitoring Profile (17)-[facility] - POCT urinalysis dipstick - COMPLETE METABOLIC PANEL WITH GFR - CBC with  Differential - Reticulocytes  RTC: 1 month  The patient was given clear instructions to go to ER or return to medical center if symptoms do not improve, worsen or new problems develop. The patient verbalized understanding. Will notify patient with laboratory results.  Massie Maroon, FNP

## 2015-06-12 LAB — CBC WITH DIFFERENTIAL/PLATELET
BASOS PCT: 1 % (ref 0–1)
Basophils Absolute: 0.1 10*3/uL (ref 0.0–0.1)
Eosinophils Absolute: 0.4 10*3/uL (ref 0.0–0.7)
Eosinophils Relative: 5 % (ref 0–5)
HCT: 24.4 % — ABNORMAL LOW (ref 39.0–52.0)
HEMOGLOBIN: 8.1 g/dL — AB (ref 13.0–17.0)
Lymphocytes Relative: 32 % (ref 12–46)
Lymphs Abs: 2.6 10*3/uL (ref 0.7–4.0)
MCH: 35.7 pg — ABNORMAL HIGH (ref 26.0–34.0)
MCHC: 33.2 g/dL (ref 30.0–36.0)
MCV: 107.5 fL — ABNORMAL HIGH (ref 78.0–100.0)
MONOS PCT: 13 % — AB (ref 3–12)
MPV: 8.4 fL — ABNORMAL LOW (ref 8.6–12.4)
Monocytes Absolute: 1.1 10*3/uL — ABNORMAL HIGH (ref 0.1–1.0)
NEUTROS ABS: 4 10*3/uL (ref 1.7–7.7)
NEUTROS PCT: 49 % (ref 43–77)
Platelets: 392 10*3/uL (ref 150–400)
RBC: 2.27 MIL/uL — AB (ref 4.22–5.81)
RDW: 19 % — ABNORMAL HIGH (ref 11.5–15.5)
WBC: 8.2 10*3/uL (ref 4.0–10.5)

## 2015-06-12 LAB — COMPLETE METABOLIC PANEL WITH GFR
ALT: 13 U/L (ref 9–46)
AST: 24 U/L (ref 10–40)
Albumin: 4.1 g/dL (ref 3.6–5.1)
Alkaline Phosphatase: 60 U/L (ref 40–115)
BILIRUBIN TOTAL: 2 mg/dL — AB (ref 0.2–1.2)
BUN: 6 mg/dL — ABNORMAL LOW (ref 7–25)
CALCIUM: 9.3 mg/dL (ref 8.6–10.3)
CHLORIDE: 102 mmol/L (ref 98–110)
CO2: 25 mmol/L (ref 20–31)
CREATININE: 0.54 mg/dL — AB (ref 0.60–1.35)
Glucose, Bld: 83 mg/dL (ref 65–99)
Potassium: 4.2 mmol/L (ref 3.5–5.3)
SODIUM: 137 mmol/L (ref 135–146)
Total Protein: 6.9 g/dL (ref 6.1–8.1)

## 2015-06-12 LAB — RETICULOCYTES
ABS RETIC: 208.8 10*3/uL — AB (ref 19.0–186.0)
RBC.: 2.27 MIL/uL — AB (ref 4.22–5.81)
RETIC CT PCT: 9.2 % — AB (ref 0.4–2.3)

## 2015-06-14 LAB — OPIATES/OPIOIDS (LC/MS-MS)
CODEINE URINE: NEGATIVE ng/mL (ref <50)
Hydrocodone: NEGATIVE ng/mL (ref <50)
Hydromorphone: NEGATIVE ng/mL (ref <50)
MORPHINE: 7171 ng/mL — AB (ref <50)
Norhydrocodone, Ur: NEGATIVE ng/mL (ref <50)
Noroxycodone, Ur: NEGATIVE ng/mL (ref <50)
OXYCODONE, UR: NEGATIVE ng/mL (ref <50)
OXYMORPHONE, URINE: NEGATIVE ng/mL (ref <50)

## 2015-06-14 LAB — CANNABANOIDS (GC/LC/MS), URINE: THC-COOH UR CONFIRM: 1234 ng/mL — AB (ref <5)

## 2015-06-16 LAB — PRESCRIPTION MONITORING PROFILE (SOLSTAS)
Amphetamine/Meth: NEGATIVE ng/mL
BARBITURATE SCREEN, URINE: NEGATIVE ng/mL
BENZODIAZEPINE SCREEN, URINE: NEGATIVE ng/mL
Buprenorphine, Urine: NEGATIVE ng/mL
CARISOPRODOL, URINE: NEGATIVE ng/mL
Cocaine Metabolites: NEGATIVE ng/mL
Creatinine, Urine: 110.46 mg/dL (ref >20.0)
ECSTASY: NEGATIVE ng/mL
FENTANYL URINE: NEGATIVE ng/mL
MEPERIDINE UR: NEGATIVE ng/mL
METHADONE SCREEN, URINE: NEGATIVE ng/mL
Nitrites, Initial: NEGATIVE ug/mL
Oxycodone Screen, Ur: NEGATIVE ng/mL
Propoxyphene: NEGATIVE ng/mL
Tapentadol, urine: NEGATIVE ng/mL
Tramadol Scrn, Ur: NEGATIVE ng/mL
ZOLPIDEM, URINE: NEGATIVE ng/mL
pH, Initial: 5.1 pH (ref 4.5–8.9)

## 2015-06-18 IMAGING — CR DG CHEST 2V
2 series · 2 of 2 positions shown · non-contrast
Comparison: 03/30/2014; 03/24/2014; 07/24/2013

CLINICAL DATA: History of sickle cell disease, now with fever

EXAM:
CHEST  2 VIEW

[w chest pa]
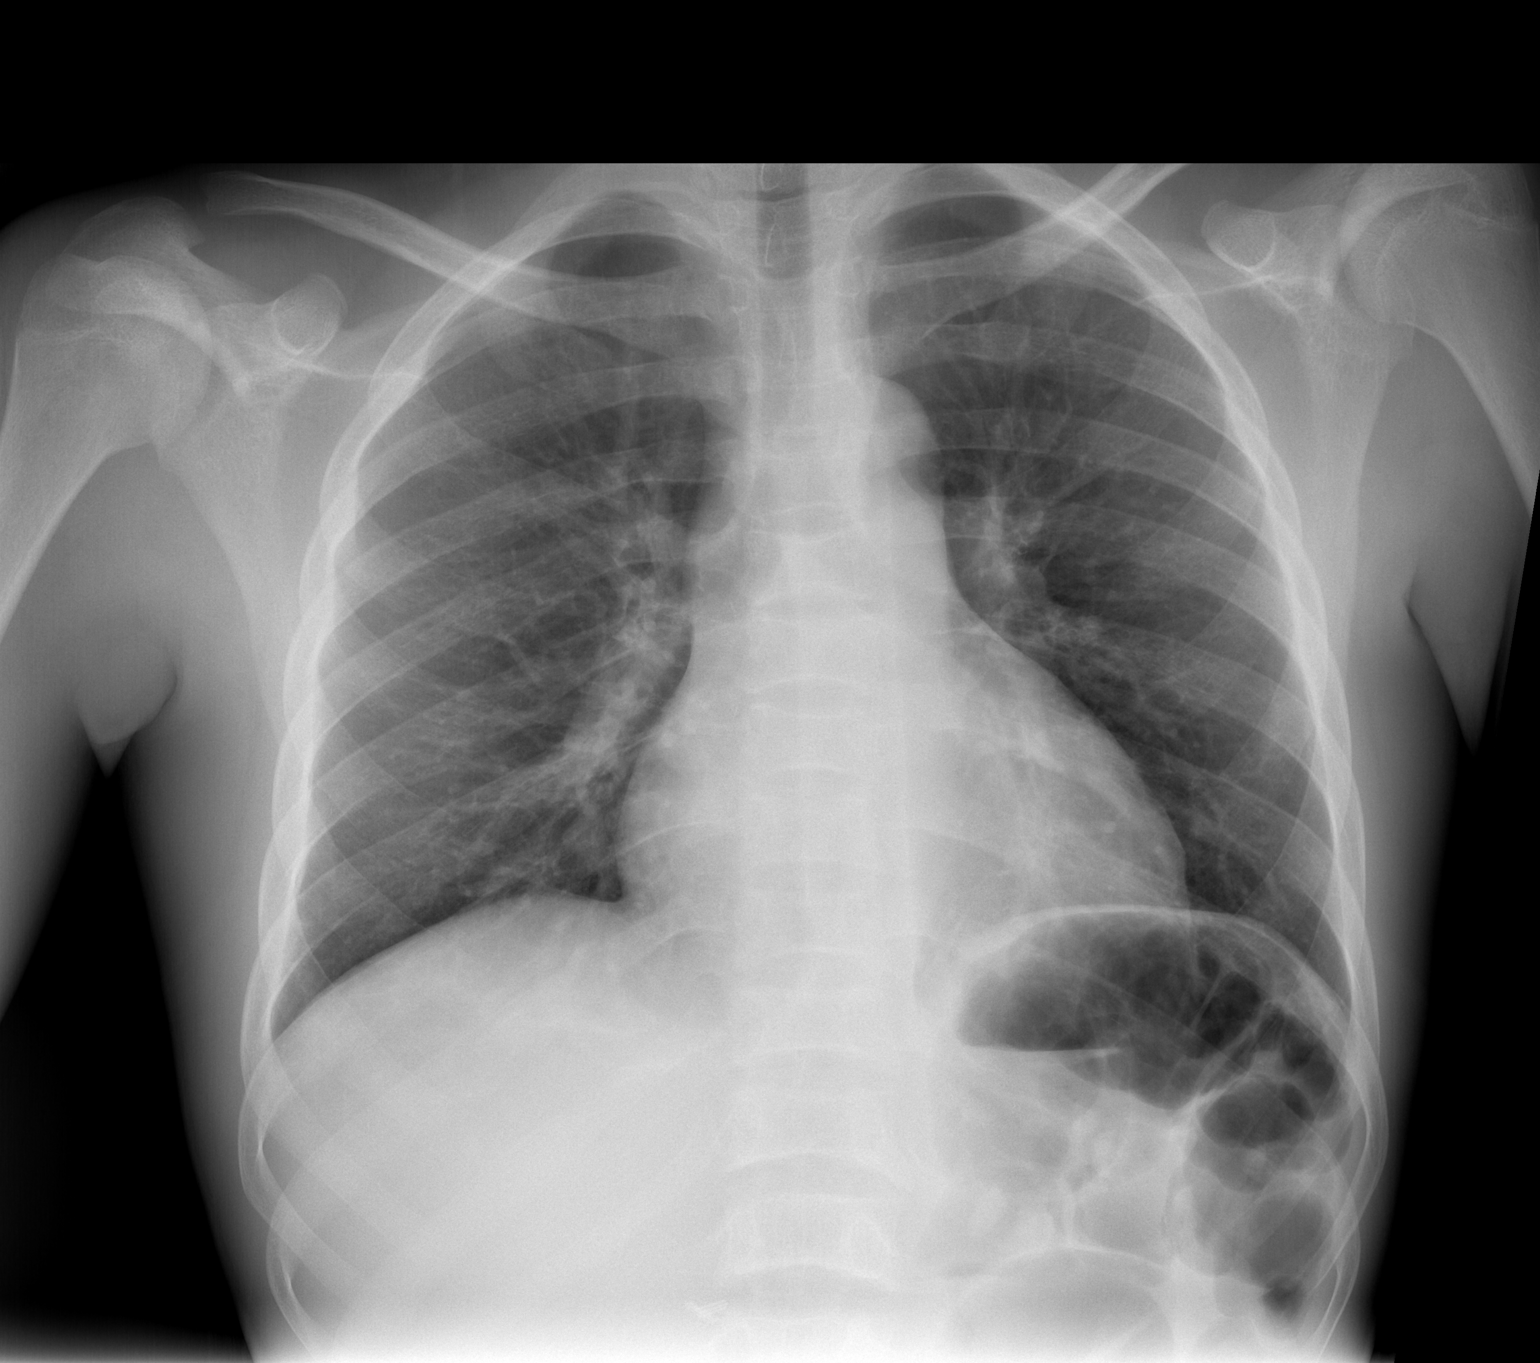

[w chest lat]
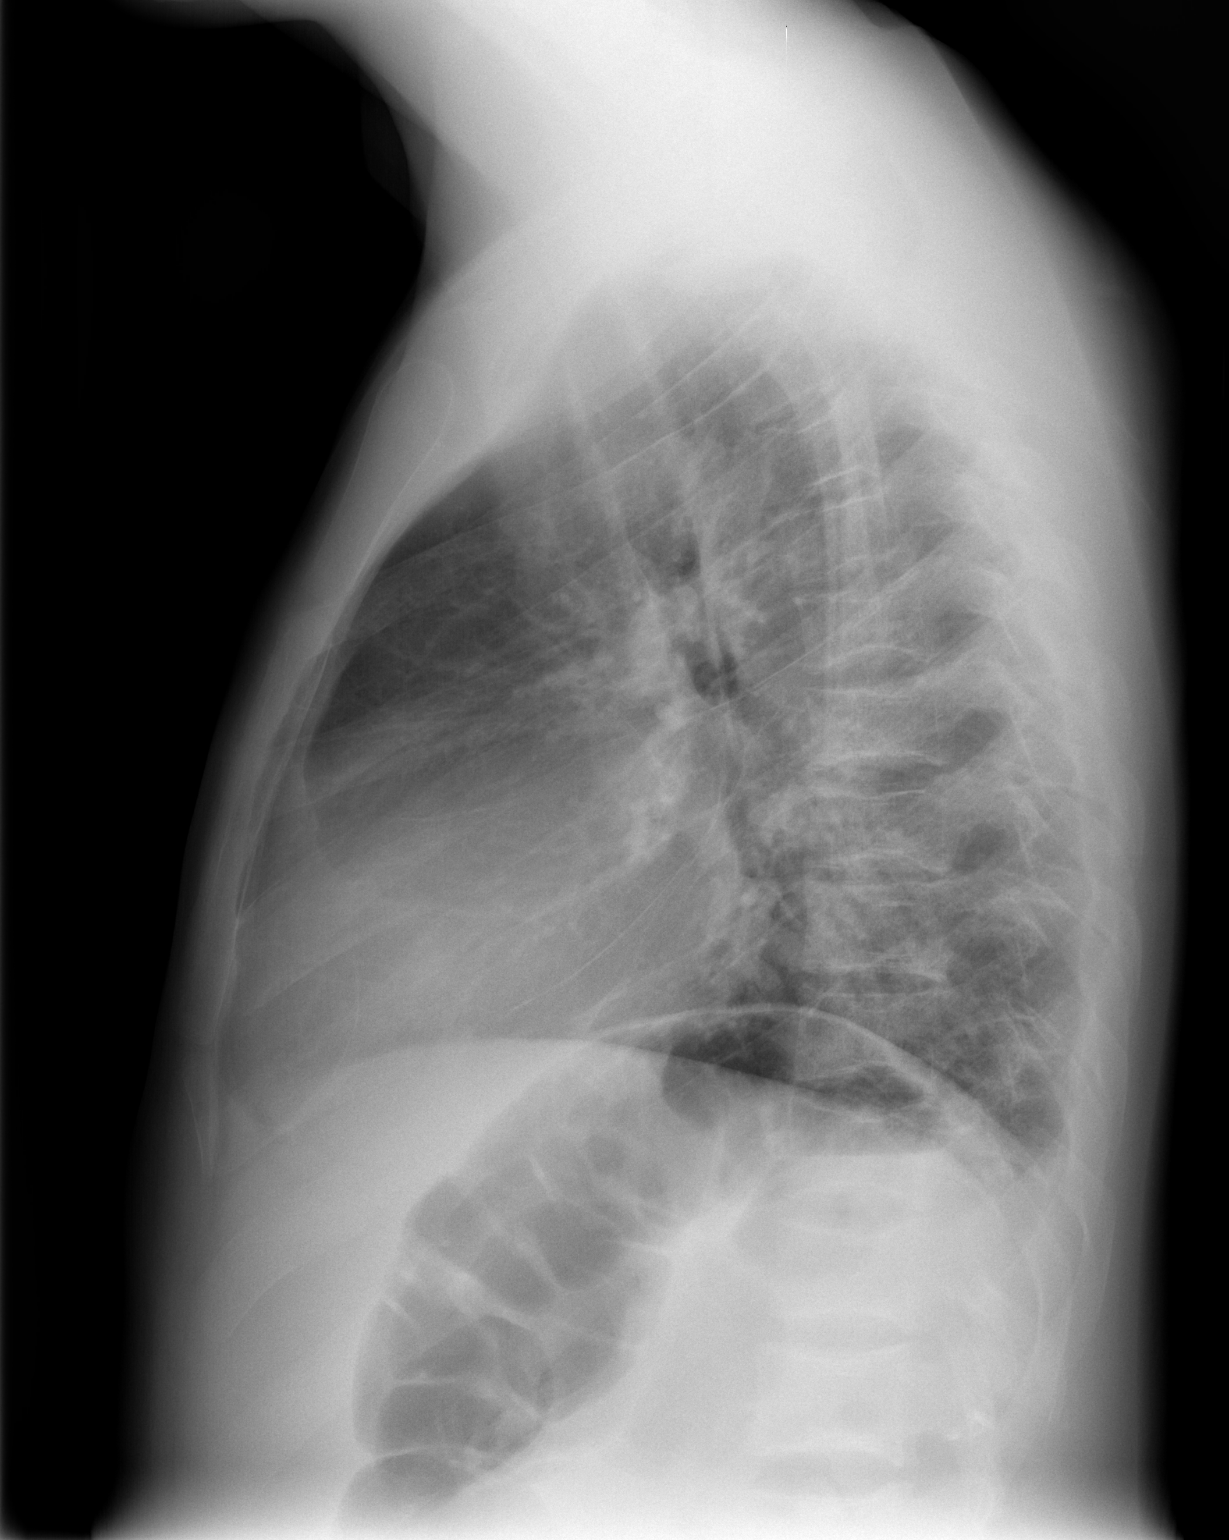

[2 of 2 positions shown; findings below may reference images not displayed]

FINDINGS: Grossly unchanged borderline enlarged cardiac silhouette and
mediastinal contours. There is unchanged mild diffuse slightly
nodular thickening of the pulmonary station. There is minimal
pleural parenchymal thickening about the bilateral minor fissures.
No discrete focal airspace opacities. No pleural effusion or
pneumothorax. No evidence of edema. Stigmata of sickle cell disease
involving the thoracic and lumbar vertebral bodies. Post
cholecystectomy. There is conspicuous absence of the splenic shadow.
Regional soft tissues appear normal.
IMPRESSION: No acute cardiopulmonary disease. Specifically, no evidence of
pneumonia.

## 2015-07-14 ENCOUNTER — Ambulatory Visit: Payer: Self-pay | Admitting: Internal Medicine

## 2015-07-21 ENCOUNTER — Ambulatory Visit: Payer: Self-pay | Admitting: Internal Medicine

## 2015-08-10 ENCOUNTER — Encounter (HOSPITAL_COMMUNITY): Payer: Self-pay

## 2015-08-10 ENCOUNTER — Emergency Department (HOSPITAL_COMMUNITY)
Admission: EM | Admit: 2015-08-10 | Discharge: 2015-08-10 | Discharge status: Against Medical Advice | Payer: BLUE CROSS/BLUE SHIELD | Attending: Emergency Medicine | Admitting: Emergency Medicine

## 2015-08-10 DIAGNOSIS — D5700 Hb-SS disease with crisis, unspecified: Secondary | ICD-10-CM | POA: Diagnosis present

## 2015-08-10 LAB — CBC WITH DIFFERENTIAL/PLATELET
Basophils Absolute: 0.1 10*3/uL (ref 0.0–0.1)
Basophils Relative: 1 %
EOS ABS: 0.5 10*3/uL (ref 0.0–0.7)
EOS PCT: 5 %
HCT: 22 % — ABNORMAL LOW (ref 39.0–52.0)
HEMOGLOBIN: 7.9 g/dL — AB (ref 13.0–17.0)
LYMPHS ABS: 3.3 10*3/uL (ref 0.7–4.0)
Lymphocytes Relative: 31 %
MCH: 34.5 pg — AB (ref 26.0–34.0)
MCHC: 35.9 g/dL (ref 30.0–36.0)
MCV: 96.1 fL (ref 78.0–100.0)
MONO ABS: 1.3 10*3/uL — AB (ref 0.1–1.0)
MONOS PCT: 12 %
Neutro Abs: 5.5 10*3/uL (ref 1.7–7.7)
Neutrophils Relative %: 51 %
PLATELETS: 551 10*3/uL — AB (ref 150–400)
RBC: 2.29 MIL/uL — ABNORMAL LOW (ref 4.22–5.81)
RDW: 18.7 % — AB (ref 11.5–15.5)
WBC: 10.7 10*3/uL — ABNORMAL HIGH (ref 4.0–10.5)

## 2015-08-10 LAB — COMPREHENSIVE METABOLIC PANEL
ALT: 18 U/L (ref 17–63)
AST: 38 U/L (ref 15–41)
Albumin: 4.6 g/dL (ref 3.5–5.0)
Alkaline Phosphatase: 81 U/L (ref 38–126)
Anion gap: 8 (ref 5–15)
BUN: 8 mg/dL (ref 6–20)
CHLORIDE: 105 mmol/L (ref 101–111)
CO2: 26 mmol/L (ref 22–32)
CREATININE: 0.59 mg/dL — AB (ref 0.61–1.24)
Calcium: 9 mg/dL (ref 8.9–10.3)
GFR calc Af Amer: >60 mL/min (ref >60)
GFR calc non Af Amer: >60 mL/min (ref >60)
GLUCOSE: 108 mg/dL — AB (ref 65–99)
Potassium: 4.5 mmol/L (ref 3.5–5.1)
SODIUM: 139 mmol/L (ref 135–145)
Total Bilirubin: 2.2 mg/dL — ABNORMAL HIGH (ref 0.3–1.2)
Total Protein: 7.3 g/dL (ref 6.5–8.1)

## 2015-08-10 NOTE — ED Notes (Signed)
Pt here with sickle cell pain.  Started this morning in back.

## 2015-08-10 NOTE — ED Notes (Signed)
Pt was brought back to room by EMT from triage, another staff member saw pt leave ER with belongings. Went to room to see if pt was there, no pt found. Pt left ER without being seen.

## 2015-08-25 ENCOUNTER — Encounter: Payer: Self-pay | Admitting: Internal Medicine

## 2015-08-25 ENCOUNTER — Ambulatory Visit (INDEPENDENT_AMBULATORY_CARE_PROVIDER_SITE_OTHER): Payer: BLUE CROSS/BLUE SHIELD | Admitting: Internal Medicine

## 2015-08-25 VITALS — BP 112/63 | HR 77 | Temp 98.0°F | Resp 18 | Ht 73.0 in | Wt 145.0 lb

## 2015-08-25 DIAGNOSIS — D571 Sickle-cell disease without crisis: Secondary | ICD-10-CM | POA: Diagnosis not present

## 2015-08-25 MED ORDER — FOLIC ACID 1 MG PO TABS: 1.0000 mg | ORAL_TABLET | Freq: Every day | ORAL | Status: DC

## 2015-08-25 MED ORDER — OXYCODONE HCL ER 10 MG PO T12A
10.0000 mg | EXTENDED_RELEASE_TABLET | Freq: Two times a day (BID) | ORAL | Status: DC
Start: 2015-08-25 — End: 2015-11-03

## 2015-08-25 MED ORDER — HYDROXYUREA 500 MG PO CAPS: 500.0000 mg | ORAL_CAPSULE | Freq: Two times a day (BID) | ORAL | Status: DC

## 2015-08-25 MED ORDER — HYDROXYUREA 500 MG PO CAPS: 1500.0000 mg | ORAL_CAPSULE | Freq: Every day | ORAL | Status: DC

## 2015-08-25 MED ORDER — OXYCODONE HCL 10 MG PO TABS: 10.0000 mg | ORAL_TABLET | Freq: Four times a day (QID) | ORAL | Status: DC | PRN

## 2015-08-25 NOTE — Progress Notes (Signed)
Patient is here for FU.  Patient denies pain at this time.  Patient complains of pain in his lower back intermittently.

## 2015-08-25 NOTE — Patient Instructions (Addendum)

## 2015-08-25 NOTE — Progress Notes (Signed)
Matthew Case, is a 20 y.o. male  NGE:[id number]  [id number]:[id number]  DOB - 11-29-1995  CC:  Chief Complaint  Patient presents with  . Follow-up       HPI: Matthew Case is a 20 y.o. male here today to for follow up visit. Patient has history of Sickle cell disease type SS, acute chest syndrome, and acute cholecystitis. Patient is a Consulting civil engineer at [employer]. Patient's home is in [place]. Patient is currently taking OxyCODONE 10 mg  Every 12 hrs and Oxycodone 10 mg every 6 hours for pain. Patient also takes folic acid and Hydrea 500 mg TID. Patient states he is compliant with all medications. Patient had recent admission to ED 08/10/2015, lab work completed and patient left before treatment. "Patient stated 4 hr wait to be seen". Patient complains of minor bone pain primarily in upper limbs rated between 3 and 4 and states pain is ongoing but controlled with current medications and occasional otc tylenol. Patient has No headache, No chest pain, No abdominal pain - No Nausea, No new weakness tingling or numbness, No Cough - SOB.  No Known Allergies Past Medical History  Diagnosis Date  . Sickle cell disease (HCC)    Current Outpatient Prescriptions on File Prior to Visit  Medication Sig Dispense Refill  . ergocalciferol (VITAMIN D2) 50000 UNITS capsule Take 1 capsule (50,000 Units total) by mouth once a week. (Patient not taking: Reported on 08/10/2015) 12 capsule 0  . ibuprofen (ADVIL,MOTRIN) 600 MG tablet Take 1 tablet (600 mg total) by mouth every 6 (six) hours as needed for mild pain. 30 tablet 2  . Multiple Vitamin (MULTIVITAMIN WITH MINERALS) TABS tablet Take 1 tablet by mouth daily. (Patient not taking: Reported on 08/10/2015) 30 tablet 0   No current facility-administered medications on file prior to visit.   Family History  Problem Relation Age of Onset  . Diabetes Maternal Grandmother   . Hypertension Maternal Grandmother   . Hypertension Maternal  Grandfather   . Diabetes Maternal Grandfather   . Hypertension Mother    Social History   Social History  . Marital Status: Single    Spouse Name: N/A  . Number of Children: N/A  . Years of Education: N/A   Occupational History  . Not on file.   Social History Main Topics  . Smoking status: Never Smoker   . Smokeless tobacco: Never Used  . Alcohol Use: No  . Drug Use: Yes    Special: Marijuana  . Sexual Activity: Yes    Birth Control/ Protection: Condom   Other Topics Concern  . Not on file   Social History Narrative    Review of Systems: Constitutional: Negative for fever, chills, diaphoresis, activity change, appetite change and fatigue. HENT: Negative for ear pain, nosebleeds, congestion, facial swelling, rhinorrhea, neck pain, neck stiffness and ear discharge.  Eyes: Negative for pain, discharge, redness, itching and visual disturbance. Respiratory: Negative for cough, choking, chest tightness, shortness of breath, wheezing and stridor.  Cardiovascular: Negative for chest pain, palpitations and leg swelling. Gastrointestinal: Negative for abdominal distention. Genitourinary: Negative for dysuria, urgency, frequency, hematuria, flank pain, decreased urine volume, difficulty urinating and dyspareunia.  Musculoskeletal: Negative for back pain, joint swelling, arthralgia and gait problem. Neurological: Negative for dizziness, tremors, seizures, syncope, facial asymmetry, speech difficulty, weakness, light-headedness, numbness and headaches.  Hematological: Negative for adenopathy. Does not bruise/bleed easily. Psychiatric/Behavioral: Negative for hallucinations, behavioral problems, confusion, dysphoric mood, decreased concentration and agitation.  Objective:   Filed Vitals:   08/25/15 0959  BP: 112/63  Pulse: 77  Temp: 98 F (36.7 C)  Resp: 18    Physical Exam: Constitutional: Patient appears well-developed and well-nourished. No distress. Eyes:  Conjunctivae and EOM are normal. PERRLA, no scleral icterus. CVS: RRR, S1/S2 +, no murmurs, no gallops, no carotid bruit.  Pulmonary: Effort and breath sounds normal, no stridor, rhonchi, wheezes, rales.  Abdominal: Soft. BS +, no distension, tenderness, rebound or guarding.  Musculoskeletal: Normal range of motion. No edema and no tenderness.  Lymphadenopathy: No lymphadenopathy noted,. Neuro: Alert & Oriented x 4 Skin: Skin is warm and dry. No rash noted. Not diaphoretic. No erythema. No pallor. Psychiatric: Normal mood and affect. Behavior, judgment, thought content normal.  Lab Results  Component Value Date   WBC 10.7* 08/10/2015   HGB 7.9* 08/10/2015   HCT 22.0* 08/10/2015   MCV 96.1 08/10/2015   PLT 551* 08/10/2015   Lab Results  Component Value Date   CREATININE 0.59* 08/10/2015   BUN 8 08/10/2015   NA 139 08/10/2015   K 4.5 08/10/2015   CL 105 08/10/2015   CO2 26 08/10/2015    No results found for: HGBA1C Lipid Panel  No results found for: CHOL, TRIG, HDL, CHOLHDL, VLDL, LDLCALC     Assessment and plan:   Matthew Case was seen today for follow-up.  Diagnoses and all orders for this visit:  Sickle cell disease, type SS (HCC) -     Discontinue: hydroxyurea (HYDREA) 500 MG capsule; Take 1 capsule (500 mg total) by mouth 2 (two) times daily. -     folic acid (FOLVITE) 1 MG tablet; Take 1 tablet (1 mg total) by mouth daily. -     oxyCODONE (OXYCONTIN) 10 mg 12 hr tablet; Take 1 tablet (10 mg total) by mouth every 12 (twelve) hours. -     Oxycodone HCl 10 MG TABS; Take 1 tablet (10 mg total) by mouth 4 (four) times daily as needed (moderate or severe pain). -     hydroxyurea (HYDREA) 500 MG capsule; Take 3 capsules (1,500 mg total) by mouth daily.  Sickle cell disease - Continue Hydrea. We discussed the need for good hydration, monitoring of hydration status, avoidance of heat, cold, stress, and infection triggers. We discussed the risks and benefits of Hydrea, including  bone marrow suppression, the possibility of GI upset, skin ulcers, hair thinning, and teratogenicity. The patient was reminded of the need to seek medical attention of any symptoms of bleeding, anemia, or infection. Continue folic acid 1 mg daily to prevent aplastic bone marrow crises.   Pulmonary evaluation - Patient denies severe recurrent wheezes, shortness of breath with exercise, or persistent cough. If these symptoms develop, pulmonary function tests with spirometry will be ordered, and if abnormal, plan on referral to Pulmonology for further evaluation.  Cardiac - Routine screening for pulmonary hypertension is not recommended.  Eye - High risk of proliferative retinopathy. Annual eye exam with retinal exam recommended to patient, the patient has had eye exam this year.  Acute and chronic painful episodes - We agreed on Opiate dose and amount of pills  per month. We discussed that pt is to receive Schedule II prescriptions only from our clinic. Pt is also aware that the prescription history is available to [place] online through the [facility] CSRS. Controlled substance agreement reviewed and signed. We reminded Matthew Case that all patients receiving Schedule II narcotics must be seen for follow within one month of prescription being requested.  We reviewed the terms of our pain agreement, including the need to keep medicines in a safe locked location away from children or pets, and the need to report excess sedation or constipation, measures to avoid constipation, and policies related to early refills and stolen prescriptions. According to the [place] Chronic Pain Initiative program, we have reviewed details related to analgesia, adverse effects and aberrant behaviors.  Return in about 4 weeks (around 09/22/2015) for Sickle Cell Disease/Pain, Hydroxyurea F/U.  The patient was given clear instructions to go to ER or return to medical center if symptoms don't improve, worsen or new problems develop. The patient  verbalized understanding. The patient was told to call to get lab results if they haven't heard anything in the next week.    Stephanie Coup, AGNP-Student [facility] [phone] 08/25/2015, 11:18 AM  Evaluation and management procedures were performed by the Advanced Practitioner under my supervision and collaboration. I have reviewed the Advanced Practitioner's note and chart, and I agree with the management and plan.   Jeanann Lewandowsky, MD, MHA, CPE, FACP, FAAP [facility] [place], [place] [phone]   08/25/2015, 6:20 PM

## 2015-09-22 ENCOUNTER — Encounter: Payer: Self-pay | Admitting: Internal Medicine

## 2015-09-22 ENCOUNTER — Ambulatory Visit (INDEPENDENT_AMBULATORY_CARE_PROVIDER_SITE_OTHER): Payer: BLUE CROSS/BLUE SHIELD | Admitting: Internal Medicine

## 2015-09-22 VITALS — BP 119/61 | HR 69 | Temp 98.0°F | Resp 18 | Ht 74.0 in | Wt 141.0 lb

## 2015-09-22 DIAGNOSIS — D571 Sickle-cell disease without crisis: Secondary | ICD-10-CM | POA: Diagnosis not present

## 2015-09-22 NOTE — Progress Notes (Signed)
Patient ID: Matthew Case, male   DOB: 10-05-1995, 20 y.o.   MRN: [id number]   Matthew Case, is a 20 y.o. male  [id number]  [id number]  DOB - 10-05-1995  Chief Complaint  Patient presents with  . Follow-up    1 month        Subjective:   Matthew Case is a 20 y.o. male with history of sickle cell disease and acute chest syndrome here today for a follow up visit. Patient continues to do well, takes his pain medication only occasionally as needed, takes his hydroxyurea daily. Last ED visit was 08/10/2015. Patient has no complaint today. 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 He also attends [employer] hematology clinics when in [place]. Patient denies any major pain, No headache, No chest pain, No abdominal pain - No Nausea, No new weakness tingling or numbness, No Cough - SOB.  No problems updated.  ALLERGIES: No Known Allergies  PAST MEDICAL HISTORY: Past Medical History  Diagnosis Date  . Sickle cell disease (HCC)     MEDICATIONS AT HOME: Prior to Admission medications   Medication Sig Start Date End Date Taking? Authorizing Provider  folic acid (FOLVITE) 1 MG tablet Take 1 tablet (1 mg total) by mouth daily. 08/25/15  Yes Quentin Angstlugbemiga E Rayne Loiseau, MD  hydroxyurea (HYDREA) 500 MG capsule Take 3 capsules (1,500 mg total) by mouth daily. 08/25/15  Yes Quentin Angstlugbemiga E Erinn Mendosa, MD  ibuprofen (ADVIL,MOTRIN) 600 MG tablet Take 1 tablet (600 mg total) by mouth every 6 (six) hours as needed for mild pain. 06/03/14  Yes Massie MaroonLachina M Hollis, FNP  oxyCODONE (OXYCONTIN) 10 mg 12 hr tablet Take 1 tablet (10 mg total) by mouth every 12 (twelve) hours. 08/25/15  Yes Quentin Angstlugbemiga E Pinchus Weckwerth, MD  Oxycodone HCl 10 MG TABS Take 1 tablet (10 mg total) by mouth 4 (four) times daily as needed (moderate or severe pain). 08/25/15  Yes Quentin Angstlugbemiga E Avanni Turnbaugh, MD  ergocalciferol (VITAMIN D2) 50000 UNITS capsule Take 1  capsule (50,000 Units total) by mouth once a week. Patient not taking: Reported on 08/10/2015 04/29/15   Quentin Angstlugbemiga E Beaux Wedemeyer, MD  Multiple Vitamin (MULTIVITAMIN WITH MINERALS) TABS tablet Take 1 tablet by mouth daily. Patient not taking: Reported on 08/10/2015 04/05/14   Rometta EmeryMohammad L Garba, MD     Objective:   Filed Vitals:   09/22/15 1112  BP: 119/61  Pulse: 69  Temp: 98 F (36.7 C)  TempSrc: Oral  Resp: 18  Height: 6\' 2"  (1.88 m)  Weight: 141 lb (63.957 kg)  SpO2: 100%    Exam General appearance : Awake, alert, not in any distress. Speech Clear. Not toxic looking HEENT: Atraumatic and Normocephalic, pupils equally reactive to light and accomodation Neck: supple, no JVD. No cervical lymphadenopathy.  Chest:Good air entry bilaterally, no added sounds  CVS: S1 S2 regular, no murmurs.  Abdomen: Bowel sounds present, Non tender and not distended with no gaurding, rigidity or rebound. Extremities: B/L Lower Ext shows no edema, both legs are warm to touch Neurology: Awake alert, and oriented X 3, CN II-XII intact, Non focal Skin:No Rash  Data Review No results found for: HGBA1C   Assessment & Plan   1. Sickle cell disease, type SS (HCC)  Continue Hydrea. We discussed the need for good hydration, monitoring of hydration status, avoidance of heat, cold, stress, and infection triggers. We discussed the risks and benefits of Hydrea,  including bone marrow suppression, the possibility of GI upset, skin ulcers, hair thinning, and teratogenicity. The patient was reminded of the need to seek medical attention of any symptoms of bleeding, anemia, or infection. Continue folic acid 1 mg daily to prevent aplastic bone marrow crises.    Acute and chronic painful episodes - We agreed on Opiate dose and amount of pills  per month. We discussed that pt is to receive Schedule II prescriptions only from our clinic. Pt is also aware that the prescription history is available to [place] online through the [facility]  CSRS. Controlled substance agreement reviewed and signed. We reminded Matthew Case that all patients receiving Schedule II narcotics must be seen for follow within one month of prescription being requested. We reviewed the terms of our pain agreement, including the need to keep medicines in a safe locked location away from children or pets, and the need to report excess sedation or constipation, measures to avoid constipation, and policies related to early refills and stolen prescriptions. According to the [place] Chronic Pain Initiative program, we have reviewed details related to analgesia, adverse effects and aberrant behaviors.  Patient have been counseled extensively about nutrition and exercise  Return in about 6 weeks (around 11/03/2015) for Sickle Cell Disease/Pain.  The patient was given clear instructions to go to ER or return to medical center if symptoms don't improve, worsen or new problems develop. The patient verbalized understanding. The patient was told to call to get lab results if they haven't heard anything in the next week.   This note has been created with Education officer, environmental. Any transcriptional errors are unintentional.    Jeanann Lewandowsky, MD, MHA, Maxwell Caul, CPE [facility] and [facility] [place], [place] [phone]   09/22/2015, 11:42 AM

## 2015-09-22 NOTE — Progress Notes (Signed)
Patient is here for 1 month FU  Patient denies pain at this time.

## 2015-09-22 NOTE — Patient Instructions (Signed)
Sickle Cell Anemia, Adult Sickle cell anemia is a condition in which red blood cells have an abnormal "sickle" shape. This abnormal shape shortens the cells' life span, which results in a lower than normal concentration of red blood cells in the blood. The sickle shape also causes the cells to clump together and block free blood flow through the blood vessels. As a result, the tissues and organs of the body do not receive enough oxygen. Sickle cell anemia causes organ damage and pain and increases the risk of infection. CAUSES  Sickle cell anemia is a genetic disorder. Those who receive two copies of the gene have the condition, and those who receive one copy have the trait. RISK FACTORS The sickle cell gene is most common in people whose families originated in [place]. Other areas of the globe where sickle cell trait occurs include the Mediterranean, South and [place], the [place], and the [place].  SIGNS AND SYMPTOMS  Pain, especially in the extremities, back, chest, or abdomen (common). The pain may start suddenly or may develop following an illness, especially if there is dehydration. Pain can also occur due to overexertion or exposure to extreme temperature changes.  Frequent severe bacterial infections, especially certain types of pneumonia and meningitis.  Pain and swelling in the hands and feet.  Decreased activity.   Loss of appetite.   Change in behavior.  Headaches.  Seizures.  Shortness of breath or difficulty breathing.  Vision changes.  Skin ulcers. Those with the trait may not have symptoms or they may have mild symptoms.  DIAGNOSIS  Sickle cell anemia is diagnosed with blood tests that demonstrate the genetic trait. It is often diagnosed during the newborn period, due to mandatory testing nationwide. A variety of blood tests, X-rays, CT scans, MRI scans, ultrasounds, and lung function tests may also be done to monitor the condition. TREATMENT  Sickle  cell anemia may be treated with:  Medicines. You may be given pain medicines, antibiotic medicines (to treat and prevent infections) or medicines to increase the production of certain types of hemoglobin.  Fluids.  Oxygen.  Blood transfusions. HOME CARE INSTRUCTIONS   Drink enough fluid to keep your urine clear or pale yellow. Increase your fluid intake in hot weather and during exercise.  Do not smoke. Smoking lowers oxygen levels in the blood.   Only take over-the-counter or prescription medicines for pain, fever, or discomfort as directed by your health care provider.  Take antibiotics as directed by your health care provider. Make sure you finish them it even if you start to feel better.   Take supplements as directed by your health care provider.   Consider wearing a medical alert bracelet. This tells anyone caring for you in an emergency of your condition.   When traveling, keep your medical information, health care provider's names, and the medicines you take with you at all times.   If you develop a fever, do not take medicines to reduce the fever right away. This could cover up a problem that is developing. Notify your health care provider.  Keep all follow-up appointments with your health care provider. Sickle cell anemia requires regular medical care. SEEK MEDICAL CARE IF: You have a fever. SEEK IMMEDIATE MEDICAL CARE IF:   You feel dizzy or faint.   You have new abdominal pain, especially on the left side near the stomach area.   You develop a persistent, often uncomfortable and painful penile erection (priapism). If this is not treated immediately it   will lead to impotence.   You have numbness your arms or legs or you have a hard time moving them.   You have a hard time with speech.   You have a fever or persistent symptoms for more than 2-3 days.   You have a fever and your symptoms suddenly get worse.   You have signs or symptoms of infection.  These include:   Chills.   Abnormal tiredness (lethargy).   Irritability.   Poor eating.   Vomiting.   You develop pain that is not helped with medicine.   You develop shortness of breath.  You have pain in your chest.   You are coughing up pus-like or bloody sputum.   You develop a stiff neck.  Your feet or hands swell or have pain.  Your abdomen appears bloated.  You develop joint pain. MAKE SURE YOU:  Understand these instructions.   This information is not intended to replace advice given to you by your health care provider. Make sure you discuss any questions you have with your health care provider.   Document Released: 09/28/2005 Document Revised: 07/11/2014 Document Reviewed: 01/30/2013 Elsevier Interactive Patient Education 2016 Elsevier Inc.  

## 2015-10-19 DIAGNOSIS — H5213 Myopia, bilateral: Secondary | ICD-10-CM | POA: Diagnosis not present

## 2015-11-03 ENCOUNTER — Encounter: Payer: Self-pay | Admitting: Internal Medicine

## 2015-11-03 ENCOUNTER — Ambulatory Visit (INDEPENDENT_AMBULATORY_CARE_PROVIDER_SITE_OTHER): Payer: BLUE CROSS/BLUE SHIELD | Admitting: Internal Medicine

## 2015-11-03 VITALS — BP 118/63 | HR 77 | Temp 98.1°F | Resp 18 | Ht 73.0 in | Wt 135.0 lb

## 2015-11-03 DIAGNOSIS — D571 Sickle-cell disease without crisis: Secondary | ICD-10-CM

## 2015-11-03 MED ORDER — HYDROXYUREA 500 MG PO CAPS: 1500.0000 mg | ORAL_CAPSULE | Freq: Every day | ORAL | Status: DC

## 2015-11-03 MED ORDER — OXYCODONE HCL ER 10 MG PO T12A: 10.0000 mg | EXTENDED_RELEASE_TABLET | Freq: Two times a day (BID) | ORAL | Status: DC

## 2015-11-03 MED ORDER — FOLIC ACID 1 MG PO TABS: 1.0000 mg | ORAL_TABLET | Freq: Every day | ORAL | Status: DC

## 2015-11-03 MED ORDER — OXYCODONE HCL 10 MG PO TABS: 10.0000 mg | ORAL_TABLET | Freq: Four times a day (QID) | ORAL | Status: DC | PRN

## 2015-11-03 MED ORDER — ADULT MULTIVITAMIN W/MINERALS CH: 1.0000 | ORAL_TABLET | Freq: Every day | ORAL | Status: DC

## 2015-11-03 NOTE — Progress Notes (Signed)
Patient is here for FU  Patient denies pain at this time  Patient request refills on medications to take with him once he leaves school to return home for the summer.

## 2015-11-03 NOTE — Progress Notes (Signed)
Patient ID: Matthew Case, male   DOB: July 05, 1995, 20 y.o.   MRN: [id number]   Matthew Case, is a 20 y.o. male  [id number]  [id number]  DOB - July 05, 1995  Chief Complaint  Patient presents with  . Follow-up        Subjective:   Matthew Case is a 20 y.o. male with history of sickle cell disease here today for a follow up visit and medication refill. Patient continues to do well, he has no new complaint. He is compliant with his medications. [place], he's requesting medication to last beyond this trip. He has no fever. He denies any major pain. Patient has No headache, No chest pain, No abdominal pain - No Nausea, No new weakness tingling or numbness, No Cough - SOB.  No problems updated.  ALLERGIES: No Known Allergies  PAST MEDICAL HISTORY: Past Medical History  Diagnosis Date  . Sickle cell disease (HCC)     MEDICATIONS AT HOME: Prior to Admission medications   Medication Sig Start Date End Date Taking? Authorizing Provider  folic acid (FOLVITE) 1 MG tablet Take 1 tablet (1 mg total) by mouth daily. 11/03/15  Yes Quentin Angstlugbemiga E Alexxa Sabet, MD  hydroxyurea (HYDREA) 500 MG capsule Take 3 capsules (1,500 mg total) by mouth daily. 11/03/15  Yes Quentin Angstlugbemiga E Diyan Dave, MD  ibuprofen (ADVIL,MOTRIN) 600 MG tablet Take 1 tablet (600 mg total) by mouth every 6 (six) hours as needed for mild pain. 06/03/14  Yes Massie MaroonLachina M Hollis, FNP  oxyCODONE (OXYCONTIN) 10 mg 12 hr tablet Take 1 tablet (10 mg total) by mouth every 12 (twelve) hours. 11/03/15  Yes Quentin Angstlugbemiga E Mitzie Marlar, MD  Oxycodone HCl 10 MG TABS Take 1 tablet (10 mg total) by mouth 4 (four) times daily as needed (moderate or severe pain). 11/03/15  Yes Quentin Angstlugbemiga E Bogdan Vivona, MD  ergocalciferol (VITAMIN D2) 50000 UNITS capsule Take 1 capsule (50,000 Units total) by mouth once a week. Patient not taking: Reported on 08/10/2015 04/29/15   Quentin Angstlugbemiga E Sammie Denner, MD  Multiple Vitamin (MULTIVITAMIN WITH  MINERALS) TABS tablet Take 1 tablet by mouth daily. 11/03/15   Quentin Angstlugbemiga E Jassica Zazueta, MD     Objective:   Filed Vitals:   11/03/15 1122  BP: 118/63  Pulse: 77  Temp: 98.1 F (36.7 C)  TempSrc: Oral  Resp: 18  Height: 6\' 1"  (1.854 m)  Weight: 135 lb (61.236 kg)  SpO2: 99%    Exam General appearance : Awake, alert, not in any distress. Speech Clear. Not toxic looking HEENT: Atraumatic and Normocephalic, pupils equally reactive to light and accomodation Neck: supple, no JVD. No cervical lymphadenopathy.  Chest:Good air entry bilaterally, no added sounds  CVS: S1 S2 regular, no murmurs.  Abdomen: Bowel sounds present, Non tender and not distended with no gaurding, rigidity or rebound. Extremities: B/L Lower Ext shows no edema, both legs are warm to touch Neurology: Awake alert, and oriented X 3, CN II-XII intact, Non focal Skin: No Rash  Data Review No results found for: HGBA1C   Assessment & Plan   1. Sickle cell disease, type SS (HCC) Refill - Oxycodone HCl 10 MG TABS; Take 1 tablet (10 mg total) by mouth 4 (four) times daily as needed (moderate or severe pain).  Dispense: 60 tablet; Refill: 0 - oxyCODONE (OXYCONTIN) 10 mg 12 hr tablet; Take 1 tablet (10 mg total) by mouth every 12 (twelve) hours.  Dispense: 60 tablet; Refill: 0 - hydroxyurea (HYDREA)  500 MG capsule; Take 3 capsules (1,500 mg total) by mouth daily.  Dispense: 270 capsule; Refill: 3 - folic acid (FOLVITE) 1 MG tablet; Take 1 tablet (1 mg total) by mouth daily.  Dispense: 90 tablet; Refill: 3 - Multiple Vitamin (MULTIVITAMIN WITH MINERALS) TABS tablet; Take 1 tablet by mouth daily.  Dispense: 90 tablet; Refill: 3  Patient have been counseled extensively about nutrition and exercise  Return in about 3 months (around 02/03/2016) for Sickle Cell Disease/Pain.  The patient was given clear instructions to go to ER or return to medical center if symptoms don't improve, worsen or new problems develop. The patient  verbalized understanding. The patient was told to call to get lab results if they haven't heard anything in the next week.   This note has been created with Education officer, environmental. Any transcriptional errors are unintentional.    Jeanann Lewandowsky, MD, MHA, Maxwell Caul, CPE [facility] and [facility] [place], [place] [phone]   11/03/2015, 11:51 AM

## 2015-11-03 NOTE — Patient Instructions (Signed)
Sickle Cell Anemia, Adult Sickle cell anemia is a condition in which red blood cells have an abnormal "sickle" shape. This abnormal shape shortens the cells' life span, which results in a lower than normal concentration of red blood cells in the blood. The sickle shape also causes the cells to clump together and block free blood flow through the blood vessels. As a result, the tissues and organs of the body do not receive enough oxygen. Sickle cell anemia causes organ damage and pain and increases the risk of infection. CAUSES  Sickle cell anemia is a genetic disorder. Those who receive two copies of the gene have the condition, and those who receive one copy have the trait. RISK FACTORS The sickle cell gene is most common in people whose families originated in [place]. Other areas of the globe where sickle cell trait occurs include the Mediterranean, South and [place], the [place], and the [place].  SIGNS AND SYMPTOMS  Pain, especially in the extremities, back, chest, or abdomen (common). The pain may start suddenly or may develop following an illness, especially if there is dehydration. Pain can also occur due to overexertion or exposure to extreme temperature changes.  Frequent severe bacterial infections, especially certain types of pneumonia and meningitis.  Pain and swelling in the hands and feet.  Decreased activity.   Loss of appetite.   Change in behavior.  Headaches.  Seizures.  Shortness of breath or difficulty breathing.  Vision changes.  Skin ulcers. Those with the trait may not have symptoms or they may have mild symptoms.  DIAGNOSIS  Sickle cell anemia is diagnosed with blood tests that demonstrate the genetic trait. It is often diagnosed during the newborn period, due to mandatory testing nationwide. A variety of blood tests, X-rays, CT scans, MRI scans, ultrasounds, and lung function tests may also be done to monitor the condition. TREATMENT  Sickle  cell anemia may be treated with:  Medicines. You may be given pain medicines, antibiotic medicines (to treat and prevent infections) or medicines to increase the production of certain types of hemoglobin.  Fluids.  Oxygen.  Blood transfusions. HOME CARE INSTRUCTIONS   Drink enough fluid to keep your urine clear or pale yellow. Increase your fluid intake in hot weather and during exercise.  Do not smoke. Smoking lowers oxygen levels in the blood.   Only take over-the-counter or prescription medicines for pain, fever, or discomfort as directed by your health care provider.  Take antibiotics as directed by your health care provider. Make sure you finish them it even if you start to feel better.   Take supplements as directed by your health care provider.   Consider wearing a medical alert bracelet. This tells anyone caring for you in an emergency of your condition.   When traveling, keep your medical information, health care provider's names, and the medicines you take with you at all times.   If you develop a fever, do not take medicines to reduce the fever right away. This could cover up a problem that is developing. Notify your health care provider.  Keep all follow-up appointments with your health care provider. Sickle cell anemia requires regular medical care. SEEK MEDICAL CARE IF: You have a fever. SEEK IMMEDIATE MEDICAL CARE IF:   You feel dizzy or faint.   You have new abdominal pain, especially on the left side near the stomach area.   You develop a persistent, often uncomfortable and painful penile erection (priapism). If this is not treated immediately it   will lead to impotence.   You have numbness your arms or legs or you have a hard time moving them.   You have a hard time with speech.   You have a fever or persistent symptoms for more than 2-3 days.   You have a fever and your symptoms suddenly get worse.   You have signs or symptoms of infection.  These include:   Chills.   Abnormal tiredness (lethargy).   Irritability.   Poor eating.   Vomiting.   You develop pain that is not helped with medicine.   You develop shortness of breath.  You have pain in your chest.   You are coughing up pus-like or bloody sputum.   You develop a stiff neck.  Your feet or hands swell or have pain.  Your abdomen appears bloated.  You develop joint pain. MAKE SURE YOU:  Understand these instructions.   This information is not intended to replace advice given to you by your health care provider. Make sure you discuss any questions you have with your health care provider.   Document Released: 09/28/2005 Document Revised: 07/11/2014 Document Reviewed: 01/30/2013 Elsevier Interactive Patient Education 2016 Elsevier Inc.  

## 2015-12-07 ENCOUNTER — Telehealth (HOSPITAL_COMMUNITY): Payer: Self-pay | Admitting: *Deleted

## 2015-12-07 NOTE — Telephone Encounter (Signed)
Called patient to extend an invitation for a Sickle Cell Education Luncheon on June 21. Left message and asked for a return call for confirmation.  

## 2016-02-24 ENCOUNTER — Encounter: Payer: Self-pay | Admitting: Family Medicine

## 2016-02-24 ENCOUNTER — Ambulatory Visit (INDEPENDENT_AMBULATORY_CARE_PROVIDER_SITE_OTHER): Payer: BLUE CROSS/BLUE SHIELD | Admitting: Family Medicine

## 2016-02-24 VITALS — BP 117/66 | HR 66 | Temp 97.9°F | Resp 14 | Ht 73.0 in | Wt 142.0 lb

## 2016-02-24 DIAGNOSIS — D571 Sickle-cell disease without crisis: Secondary | ICD-10-CM

## 2016-02-24 DIAGNOSIS — Z23 Encounter for immunization: Secondary | ICD-10-CM

## 2016-02-24 DIAGNOSIS — E559 Vitamin D deficiency, unspecified: Secondary | ICD-10-CM | POA: Diagnosis not present

## 2016-02-24 LAB — CBC WITH DIFFERENTIAL/PLATELET
BASOS ABS: 81 {cells}/uL (ref 0–200)
Basophils Relative: 1 %
EOS PCT: 5 %
Eosinophils Absolute: 405 cells/uL (ref 15–500)
HEMATOCRIT: 24 % — AB (ref 38.5–50.0)
HEMOGLOBIN: 8.1 g/dL — AB (ref 13.2–17.1)
LYMPHS ABS: 2106 {cells}/uL (ref 850–3900)
Lymphocytes Relative: 26 %
MCH: 35.1 pg — ABNORMAL HIGH (ref 27.0–33.0)
MCHC: 33.8 g/dL (ref 32.0–36.0)
MCV: 103.9 fL — ABNORMAL HIGH (ref 80.0–100.0)
MONO ABS: 1296 {cells}/uL — AB (ref 200–950)
MPV: 8.8 fL (ref 7.5–12.5)
Monocytes Relative: 16 %
NEUTROS ABS: 4212 {cells}/uL (ref 1500–7800)
NEUTROS PCT: 52 %
Platelets: 436 10*3/uL — ABNORMAL HIGH (ref 140–400)
RBC: 2.31 MIL/uL — ABNORMAL LOW (ref 4.20–5.80)
RDW: 18.8 % — ABNORMAL HIGH (ref 11.0–15.0)
WBC: 8.1 10*3/uL (ref 3.8–10.8)

## 2016-02-24 LAB — COMPLETE METABOLIC PANEL WITH GFR
ALK PHOS: 58 U/L (ref 40–115)
ALT: 11 U/L (ref 9–46)
AST: 21 U/L (ref 10–40)
Albumin: 4.2 g/dL (ref 3.6–5.1)
BUN: 9 mg/dL (ref 7–25)
CHLORIDE: 110 mmol/L (ref 98–110)
CO2: 22 mmol/L (ref 20–31)
Calcium: 9.2 mg/dL (ref 8.6–10.3)
Creat: 0.61 mg/dL (ref 0.60–1.35)
GFR, Est African American: >89 mL/min (ref >=60)
GLUCOSE: 89 mg/dL (ref 65–99)
POTASSIUM: 4.4 mmol/L (ref 3.5–5.3)
SODIUM: 142 mmol/L (ref 135–146)
Total Bilirubin: 2.1 mg/dL — ABNORMAL HIGH (ref 0.2–1.2)
Total Protein: 6.3 g/dL (ref 6.1–8.1)

## 2016-02-24 LAB — RETICULOCYTES
ABS RETIC: 258720 {cells}/uL — AB (ref 25000–90000)
RBC.: 2.31 MIL/uL — ABNORMAL LOW (ref 4.20–5.80)
Retic Ct Pct: 11.2 %

## 2016-02-24 MED ORDER — FOLIC ACID 1 MG PO TABS: 1.0000 mg | ORAL_TABLET | Freq: Every day | ORAL | 3 refills | Status: DC

## 2016-02-24 MED ORDER — OXYCODONE HCL ER 10 MG PO T12A: 10.0000 mg | EXTENDED_RELEASE_TABLET | Freq: Two times a day (BID) | ORAL | 0 refills | Status: DC

## 2016-02-24 MED ORDER — ERGOCALCIFEROL 1.25 MG (50000 UT) PO CAPS: 50000.0000 [IU] | ORAL_CAPSULE | ORAL | 0 refills | Status: DC

## 2016-02-24 MED ORDER — HYDROXYUREA 500 MG PO CAPS: 1500.0000 mg | ORAL_CAPSULE | Freq: Every day | ORAL | 3 refills | Status: DC

## 2016-02-24 MED ORDER — OXYCODONE HCL 10 MG PO TABS: 10.0000 mg | ORAL_TABLET | Freq: Four times a day (QID) | ORAL | 0 refills | Status: DC | PRN

## 2016-02-24 MED ORDER — ADULT MULTIVITAMIN W/MINERALS CH: 1.0000 | ORAL_TABLET | Freq: Every day | ORAL | 3 refills | Status: DC

## 2016-02-24 NOTE — Patient Instructions (Signed)
Continue current treatment. 

## 2016-02-24 NOTE — Progress Notes (Signed)
Matthew Case, is a 20 y.o. male  ZOX:[id number]  [id number]:[id number]  DOB - 03-14-1996  CC:  Chief Complaint  Patient presents with  . Follow-up    scd  . Medication Refill    refills on all meds        HPI: Matthew Case is a 20 y.o. male here sickle cell follow-up. He reports doing well. He reports taking his medications as ordered.  He is needing refills on all medications today. His last short-acting narcotic was about 3 weeks. Ago. His last refill was in May. When he does have pain it is in his low back and legs.   No Known Allergies Past Medical History:  Diagnosis Date  . Sickle cell disease (HCC)    Current Outpatient Prescriptions on File Prior to Visit  Medication Sig Dispense Refill  . ibuprofen (ADVIL,MOTRIN) 600 MG tablet Take 1 tablet (600 mg total) by mouth every 6 (six) hours as needed for mild pain. 30 tablet 2   No current facility-administered medications on file prior to visit.    Family History  Problem Relation Age of Onset  . Hypertension Mother   . Diabetes Maternal Grandmother   . Hypertension Maternal Grandmother   . Hypertension Maternal Grandfather   . Diabetes Maternal Grandfather    Social History   Social History  . Marital status: Single    Spouse name: N/A  . Number of children: N/A  . Years of education: N/A   Occupational History  . Not on file.   Social History Main Topics  . Smoking status: Never Smoker  . Smokeless tobacco: Never Used  . Alcohol use No  . Drug use:     Types: Marijuana  . Sexual activity: Yes    Birth control/ protection: Condom   Other Topics Concern  . Not on file   Social History Narrative  . No narrative on file    Review of Systems: Constitutional: Negative for fever, chills, weight loss, appetite loss HENT: Denies Problems Eyes: Negative for blurry vision, bleeding into eyes. Neck: Denies problems Respiratory: Negative for cough, shortness of breath,   Cardiovascular: Negative for chest  pain, palpitations and leg swelling. Gastrointestinal: Negative for abdominal pain, nausea,vomitng, diarrhea, constipation. Genitourinary: Denies problems Musculoskeletal: Denies problems Neurological: Denies problems Hematological: Denies problems Psychiatric/Behavioral: Denies depression, anxiety.   Objective:   Vitals:   02/24/16 1024  BP: 117/66  Pulse: 66  Resp: 14  Temp: 97.9 F (36.6 C)    Physical Exam: Constitutional: Patient appears well-developed and well-nourished. No distress. HENT: Normocephalic, atraumatic, External right and left ear normal. Oropharynx is clear and moist.  Eyes: Conjunctivae and EOM are normal. PERRLA, no scleral icterus. Neck: Normal ROM. Neck supple. No lymphadenopathy, No thyromegaly. CVS: RRR, S1/S2 +, no murmurs, no gallops, no rubs Pulmonary: Effort and breath sounds normal, no stridor, rhonchi, wheezes, rales.  Abdominal: Soft. Normoactive BS,, no distension, tenderness, rebound or guarding.  Musculoskeletal: Normal range of motion. No edema and no tenderness.  Neuro: Alert.Normal muscle tone coordination. Non-focal Skin: Skin is warm and dry. No rash noted. Not diaphoretic. No erythema. No pallor. Psychiatric: Normal mood and affect. Behavior, judgment, thought content normal.  Lab Results  Component Value Date   WBC 10.7 (H) 08/10/2015   HGB 7.9 (L) 08/10/2015   HCT 22.0 (L) 08/10/2015   MCV 96.1 08/10/2015   PLT 551 (H) 08/10/2015   Lab Results  Component Value Date   CREATININE 0.59 (L) 08/10/2015   BUN 8  08/10/2015   NA 139 08/10/2015   K 4.5 08/10/2015   CL 105 08/10/2015   CO2 26 08/10/2015    No results found for: HGBA1C Lipid Panel  No results found for: CHOL, TRIG, HDL, CHOLHDL, VLDL, LDLCALC     Assessment and plan:   1. Encounter for immunization  - Flu Vaccine QUAD 36+ mos IM - COMPLETE METABOLIC PANEL WITH GFR - CBC with Differential - Reticulocytes - folic acid (FOLVITE) 1 MG tablet; Take 1 tablet  (1 mg total) by mouth daily.  Dispense: 90 tablet; Refill: 3 - Multiple Vitamin (MULTIVITAMIN WITH MINERALS) TABS tablet; Take 1 tablet by mouth daily.  Dispense: 90 tablet; Refill: 3 - hydroxyurea (HYDREA) 500 MG capsule; Take 3 capsules (1,500 mg total) by mouth daily.  Dispense: 270 capsule; Refill: 3 - Oxycodone HCl 10 MG TABS; Take 1 tablet (10 mg total) by mouth 4 (four) times daily as needed (moderate or severe pain).  Dispense: 60 tablet; Refill: 0 - oxyCODONE (OXYCONTIN) 10 mg 12 hr tablet; Take 1 tablet (10 mg total) by mouth every 12 (twelve) hours.  Dispense: 60 tablet; Refill: 0 - ergocalciferol (VITAMIN D2) 50000 units capsule; Take 1 capsule (50,000 Units total) by mouth once a week.  Dispense: 12 capsule; Refill: 0 - Vitamin D 1,25 dihydroxy  2. Sickle cell anemia without crisis (HCC) - COMPLETE METABOLIC PANEL WITH GFR - CBC with Differential - Reticulocytes  3. Sickle cell disease, type SS (HCC)  - folic acid (FOLVITE) 1 MG tablet; Take 1 tablet (1 mg total) by mouth daily.  Dispense: 90 tablet; Refill: 3 - Multiple Vitamin (MULTIVITAMIN WITH MINERALS) TABS tablet; Take 1 tablet by mouth daily.  Dispense: 90 tablet; Refill: 3 - hydroxyurea (HYDREA) 500 MG capsule; Take 3 capsules (1,500 mg total) by mouth daily.  Dispense: 270 capsule; Refill: 3 - Oxycodone HCl 10 MG TABS; Take 1 tablet (10 mg total) by mouth 4 (four) times daily as needed (moderate or severe pain).  Dispense: 60 tablet; Refill: 0 - oxyCODONE (OXYCONTIN) 10 mg 12 hr tablet; Take 1 tablet (10 mg total) by mouth every 12 (twelve) hours.  Dispense: 60 tablet; Refill: 0  4. Vitamin D deficiency  - ergocalciferol (VITAMIN D2) 50000 units capsule; Take 1 capsule (50,000 Units total) by mouth once a week.  Dispense: 12 capsule; Refill: 0 - Vitamin D 1,25 dihydroxy  5. Need for prophylactic vaccination and inoculation against influenza  - Flu Vaccine QUAD 36+ mos IM   -Sickle Cell Disease  Patient counseled  and given handout on use of opoid pain medications, including need to only get from us and our ability to follow use on [place]  CSRS and need to keep in a safe locked place away from children and pets. Have reviewed our refill policy related to erly refills if lost or stolen. Have reviewed possible side effects of opoids, Hydrea and need to take other SCD related medications as ordered.  Have review health maintenance needs, including immunizations, urine for proteim, dilated eye exam.  Have review the importance of smoking cessation if currently smoking.   The patient was given clear instructions to go to ER or return to medical center if symptoms don't improve, worsen or new problems develop. The patient verbalized understanding. The patient was told to call to get lab results if they haven't heard anything in the next week.     Return in about 3 months (around 05/26/2016).       Henrietta HooverLinda C. Coni Homesley, MSN,  FNP-BC   02/24/2016, 11:33 AM

## 2016-02-27 LAB — VITAMIN D 1,25 DIHYDROXY
Vitamin D 1, 25 (OH)2 Total: 27 pg/mL (ref 18–72)
Vitamin D2 1, 25 (OH)2: <8 pg/mL
Vitamin D3 1, 25 (OH)2: 27 pg/mL

## 2016-05-05 ENCOUNTER — Other Ambulatory Visit: Payer: Self-pay | Admitting: Internal Medicine

## 2016-05-05 ENCOUNTER — Telehealth: Payer: Self-pay

## 2016-05-05 DIAGNOSIS — D5700 Hb-SS disease with crisis, unspecified: Secondary | ICD-10-CM

## 2016-05-05 DIAGNOSIS — D571 Sickle-cell disease without crisis: Secondary | ICD-10-CM

## 2016-05-05 MED ORDER — OXYCODONE HCL 10 MG PO TABS: 10.0000 mg | ORAL_TABLET | Freq: Four times a day (QID) | ORAL | 0 refills | Status: DC | PRN

## 2016-05-05 MED ORDER — OXYCODONE HCL ER 10 MG PO T12A: 10.0000 mg | EXTENDED_RELEASE_TABLET | Freq: Two times a day (BID) | ORAL | 0 refills | Status: DC

## 2016-05-17 ENCOUNTER — Ambulatory Visit: Payer: BLUE CROSS/BLUE SHIELD | Admitting: Internal Medicine

## 2016-05-31 ENCOUNTER — Ambulatory Visit: Payer: BLUE CROSS/BLUE SHIELD | Admitting: Internal Medicine

## 2016-08-04 ENCOUNTER — Telehealth: Payer: Self-pay

## 2016-08-04 ENCOUNTER — Other Ambulatory Visit: Payer: Self-pay | Admitting: Internal Medicine

## 2016-08-04 DIAGNOSIS — D571 Sickle-cell disease without crisis: Secondary | ICD-10-CM

## 2016-08-04 DIAGNOSIS — D5700 Hb-SS disease with crisis, unspecified: Secondary | ICD-10-CM

## 2016-08-04 MED ORDER — FOLIC ACID 1 MG PO TABS: 1.0000 mg | ORAL_TABLET | Freq: Every day | ORAL | 3 refills | Status: DC

## 2016-08-04 MED ORDER — OXYCODONE HCL ER 10 MG PO T12A: 10.0000 mg | EXTENDED_RELEASE_TABLET | Freq: Two times a day (BID) | ORAL | 0 refills | Status: DC

## 2016-08-04 MED ORDER — OXYCODONE HCL 10 MG PO TABS: 10.0000 mg | ORAL_TABLET | Freq: Four times a day (QID) | ORAL | 0 refills | Status: DC | PRN

## 2016-08-04 MED ORDER — HYDROXYUREA 500 MG PO CAPS: 1500.0000 mg | ORAL_CAPSULE | Freq: Every day | ORAL | 3 refills | Status: DC

## 2016-10-11 ENCOUNTER — Telehealth: Payer: Self-pay

## 2016-10-11 ENCOUNTER — Other Ambulatory Visit: Payer: Self-pay | Admitting: Internal Medicine

## 2016-10-11 DIAGNOSIS — D571 Sickle-cell disease without crisis: Secondary | ICD-10-CM

## 2016-10-11 DIAGNOSIS — D5700 Hb-SS disease with crisis, unspecified: Secondary | ICD-10-CM

## 2016-10-11 MED ORDER — OXYCODONE HCL 10 MG PO TABS: 10.0000 mg | ORAL_TABLET | Freq: Four times a day (QID) | ORAL | 0 refills | Status: DC | PRN

## 2016-10-11 MED ORDER — OXYCODONE HCL ER 10 MG PO T12A: 10.0000 mg | EXTENDED_RELEASE_TABLET | Freq: Two times a day (BID) | ORAL | 0 refills | Status: DC

## 2016-11-23 ENCOUNTER — Encounter: Payer: Self-pay | Admitting: Family Medicine

## 2016-11-23 ENCOUNTER — Ambulatory Visit (INDEPENDENT_AMBULATORY_CARE_PROVIDER_SITE_OTHER): Payer: BLUE CROSS/BLUE SHIELD | Admitting: Family Medicine

## 2016-11-23 VITALS — BP 138/74 | HR 73 | Temp 97.9°F | Resp 16 | Ht 73.0 in | Wt 137.0 lb

## 2016-11-23 DIAGNOSIS — D571 Sickle-cell disease without crisis: Secondary | ICD-10-CM

## 2016-11-23 DIAGNOSIS — G8929 Other chronic pain: Secondary | ICD-10-CM | POA: Diagnosis not present

## 2016-11-23 DIAGNOSIS — D5700 Hb-SS disease with crisis, unspecified: Secondary | ICD-10-CM | POA: Diagnosis not present

## 2016-11-23 DIAGNOSIS — R634 Abnormal weight loss: Secondary | ICD-10-CM | POA: Diagnosis not present

## 2016-11-23 DIAGNOSIS — E559 Vitamin D deficiency, unspecified: Secondary | ICD-10-CM

## 2016-11-23 LAB — POCT URINALYSIS DIP (DEVICE)
BILIRUBIN URINE: NEGATIVE
Glucose, UA: NEGATIVE mg/dL
HGB URINE DIPSTICK: NEGATIVE
Ketones, ur: NEGATIVE mg/dL
NITRITE: NEGATIVE
Protein, ur: NEGATIVE mg/dL
SPECIFIC GRAVITY, URINE: 1.015 (ref 1.005–1.030)
UROBILINOGEN UA: 0.2 mg/dL (ref 0.0–1.0)
pH: 6 (ref 5.0–8.0)

## 2016-11-23 MED ORDER — L-GLUTAMINE ORAL POWDER: 10.0000 g | PACK | Freq: Two times a day (BID) | ORAL | 5 refills | Status: DC

## 2016-11-23 MED ORDER — OXYCODONE HCL ER 10 MG PO T12A: 10.0000 mg | EXTENDED_RELEASE_TABLET | Freq: Two times a day (BID) | ORAL | 0 refills | Status: AC

## 2016-11-23 MED ORDER — OXYCODONE HCL 10 MG PO TABS: 10.0000 mg | ORAL_TABLET | Freq: Four times a day (QID) | ORAL | 0 refills | Status: AC | PRN

## 2016-11-23 NOTE — Patient Instructions (Addendum)
I am starting you today on Endari 10 mg twice daily.  Continue all other medication prescribed.   Return for follow-up 3 months after your visit with hematology.

## 2016-11-23 NOTE — Progress Notes (Signed)
Patient ID: Matthew Case, male    DOB: 06-22-1996, 21 y.o.   MRN: [id number]  PCP: Bing Neighbors, FNP  Chief Complaint  Patient presents with  . Follow-up    Sickle cell    Subjective:  HPI Matthew Case is a 21 y.o. male presents for routine sickle cell anemia follow-up. Medical problems include: Sickle Cell Disease Type Hb-SS, Vitamin D deficiency, Chronic Pain.  [name] reports overall stable health without any recent pain crisis. 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 He is from [place] and is followed by hematology at [facility] with his last visit in March 2018. He is rarely seen here at [facility], as his pain is routinely controlled and he uses opioid medication, only when completely necessary. Reports recent pain in back and legs which he feels is related to fatigue as he has been very involved with school activities. He reports a history of headaches and feels the frequency has increase lately due to lack of rest. He feels that his appetite has mildly decrease although uncertain of weight loss. He eats 2 meals daily and mostly drinks water. He is sexually active and declines STI testing today as he reports recently test negative during a campus outreach screening. [name] denies any associated dysuria, chest pain, shortness of breath, or abdominal pain.  Social History   Social History  . Marital status: Single    Spouse name: N/A  . Number of children: N/A  . Years of education: N/A   Occupational History  . Not on file.   Social History Main Topics  . Smoking status: Never Smoker  . Smokeless tobacco: Never Used  . Alcohol use No  . Drug use: Yes    Types: Marijuana  . Sexual activity: Yes    Birth control/ protection: Condom   Other Topics Concern  . Not on file   Social History Narrative  . No narrative on file    Family History  Problem Relation Age of Onset  . Hypertension Mother   .  Diabetes Maternal Grandmother   . Hypertension Maternal Grandmother   . Hypertension Maternal Grandfather   . Diabetes Maternal Grandfather      Review of Systems  Patient Active Problem List   Diagnosis Date Noted  . Vitamin D deficiency 06/06/2014  . Chest pain 12/02/2013  . 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 Sickle cell crisis (HCC) 09/24/2013  . Acute cholecystitis 07/26/2013  . Hyperbilirubinemia 07/26/2013  . Acute chest syndrome (HCC) 04/20/2013  . Sickle cell disease, type SS (HCC) 04/20/2013  . Pleuritic chest pain 04/20/2013    No Known Allergies  Prior to Admission medications   Medication Sig Start Date End Date Taking? Authorizing Provider  ergocalciferol (VITAMIN D2) 50000 units capsule Take 1 capsule (50,000 Units total) by mouth once a week. 02/24/16  Yes Henrietta Hoover, NP  folic acid (FOLVITE) 1 MG tablet Take 1 tablet (1 mg total) by mouth daily. 08/04/16  Yes Quentin Angst, MD  hydroxyurea (HYDREA) 500 MG capsule Take 3 capsules (1,500 mg total) by mouth daily. 08/04/16  Yes Quentin Angst, MD  ibuprofen (ADVIL,MOTRIN) 600 MG tablet Take 1 tablet (600 mg total) by mouth every 6 (six) hours as needed for mild pain. 06/03/14  Yes Massie Maroon, FNP  Multiple Vitamin (MULTIVITAMIN WITH MINERALS) TABS tablet Take 1 tablet by mouth daily. 02/24/16  Yes Henrietta Hoover, NP  oxyCODONE (OXYCONTIN) 10 mg 12 hr tablet Take 1 tablet (10 mg total) by mouth every 12 (twelve) hours. 10/11/16  Yes Quentin AngstJegede, Olugbemiga E, MD  Oxycodone HCl 10 MG TABS Take 1 tablet (10 mg total) by mouth 4 (four) times daily as needed (moderate or severe pain). 10/11/16  Yes Quentin AngstJegede, Olugbemiga E, MD    Past Medical, Surgical Family and Social History reviewed and updated.    Objective:   Today's Vitals   11/23/16 0917  BP: 138/74  Pulse: 73  Resp: 16  Temp: 97.9 F (36.6 C)  TempSrc: Oral  SpO2: 100%  Weight: 137 lb (62.1 kg)  Height: 6\' 1"   (1.854 m)    Wt Readings from Last 3 Encounters:  11/23/16 137 lb (62.1 kg)  02/24/16 142 lb (64.4 kg)  11/03/15 135 lb (61.2 kg)    Physical Exam  Constitutional: He is oriented to person, place, and time. He appears well-developed and well-nourished.  HENT:  Head: Normocephalic and atraumatic.  Right Ear: External ear normal.  Left Ear: External ear normal.  Nose: Nose normal.  Mouth/Throat: Oropharynx is clear and moist.  Eyes: Conjunctivae and EOM are normal. Pupils are equal, round, and reactive to light. No scleral icterus.  Neck: Normal range of motion. Neck supple.  Cardiovascular: Normal rate, regular rhythm, normal heart sounds and intact distal pulses.   Pulmonary/Chest: Effort normal and breath sounds normal.  Abdominal: Soft. Bowel sounds are normal. He exhibits no distension and no mass. There is no tenderness. There is no rebound and no guarding.  Musculoskeletal: Normal range of motion.  Neurological: He is alert and oriented to person, place, and time.  Skin: Skin is warm and dry.  Psychiatric: He has a normal mood and affect. His behavior is normal. Judgment and thought content normal.      Assessment & Plan:  1. Hb-SS disease without crisis (HCC) Continue Hydrea. We discussed the need for good hydration, monitoring of hydration status, avoidance of heat, cold, stress, and infection triggers. We discussed the risks and benefits of Hydrea, including bone marrow suppression, the possibility of GI upset, skin ulcers, hair thinning, and teratogenicity. The patient was reminded of the need to seek medical attention for any symptoms of bleeding, anemia, or infection. Continue folic acid 1 mg daily to prevent aplastic bone marrow crises. -We discussed and agreed to start him today on L-glutamine (Endari) 10 grams, packet,  twice daily.  -CBC with Differential - COMPLETE METABOLIC PANEL WITH GFR - Reticulocytes    Pulmonary evaluation - Patient denies severe recurrent  wheezes, shortness of breath with exercise, or persistent cough. If these symptoms develop, pulmonary function tests with spirometry will be ordered, and if abnormal, plan on referral to Pulmonology for further evaluation.  Eye - High risk of proliferative retinopathy. Annual eye exam with retinal exam recommended to patient, the patient has had eye exam this year. Schedule 12 months.  Immunization status - Yearly influenza vaccination is recommended, as well as being up to date with Meningococcal and Pneumococcal vaccines.   2. Other chronic pain We agreed on Opiate dose and amount of pills  per month. We discussed that pt is to receive Schedule II prescriptions only from our clinic. Pt is also aware that the prescription history is available to us online through the [facility]. Controlled substance agreement reviewed and signed. We reminded  .Kadan Cranston that all patients receiving Schedule II narcotics must be seen for follow within one month of prescription being requested. We  reviewed the terms of our pain agreement, including the need to keep medicines in a safe locked location away from children or pets, and the need to report excess sedation or constipation, measures to avoid constipation, and policies related to early refills and stolen prescriptions. According to the [place] Chronic Pain Initiative program, we have reviewed details related to analgesia, adverse effects and aberrant behaviors.   - Drug Screen, Urine  Oxycodone HCl 10 MG TABS; Take 1 tablet (10 mg total) by mouth 4 (four) times daily as needed (moderate or severe pain). Do not fill after 12/08/2016  Dispense: 60 tablet; Refill: 0 - oxyCODONE (OXYCONTIN) 10 mg 12 hr tablet; Take 1 tablet (10 mg total) by mouth every 12 (twelve) hours. Do not fill after 12/24/2016  Dispense: 60 tablet; Refill: 0  3. Weight loss - Thyroid Panel With TSH -Eat frequent high-calorie small meals, to promote weigh gain -negative for any significant weigh loss    The patient was given clear instructions to go to ER or return to medical center if symptoms don't improve, worsen or new problems develop. The patient verbalized understanding. The patient was told to call to get lab results if they haven't heard anything in the next week.     RTC: Patient is returning home for summer and will return for follow-up in August 2018   Godfrey Pick. Tiburcio Pea, MSN, FNP-C [facility]  [address] [place], [place] [address] [phone]

## 2016-11-24 ENCOUNTER — Telehealth: Payer: Self-pay

## 2016-11-24 DIAGNOSIS — G894 Chronic pain syndrome: Secondary | ICD-10-CM

## 2016-11-24 LAB — COMPLETE METABOLIC PANEL WITH GFR
ALT: 13 U/L (ref 9–46)
AST: 22 U/L (ref 10–40)
Albumin: 4.5 g/dL (ref 3.6–5.1)
Alkaline Phosphatase: 54 U/L (ref 40–115)
BILIRUBIN TOTAL: 2.3 mg/dL — AB (ref 0.2–1.2)
BUN: 10 mg/dL (ref 7–25)
CALCIUM: 9.3 mg/dL (ref 8.6–10.3)
CO2: 23 mmol/L (ref 20–31)
CREATININE: 0.7 mg/dL (ref 0.60–1.35)
Chloride: 109 mmol/L (ref 98–110)
Glucose, Bld: 104 mg/dL — ABNORMAL HIGH (ref 65–99)
Potassium: 4.1 mmol/L (ref 3.5–5.3)
Sodium: 140 mmol/L (ref 135–146)
Total Protein: 7 g/dL (ref 6.1–8.1)

## 2016-11-24 LAB — CBC WITH DIFFERENTIAL/PLATELET
Basophils Absolute: 86 cells/uL (ref 0–200)
Basophils Relative: 1 %
Eosinophils Absolute: 344 cells/uL (ref 15–500)
Eosinophils Relative: 4 %
HEMATOCRIT: 23.3 % — AB (ref 38.5–50.0)
Hemoglobin: 7.6 g/dL — ABNORMAL LOW (ref 13.2–17.1)
LYMPHS PCT: 47 %
Lymphs Abs: 4042 cells/uL — ABNORMAL HIGH (ref 850–3900)
MCH: 36.7 pg — ABNORMAL HIGH (ref 27.0–33.0)
MCHC: 32.6 g/dL (ref 32.0–36.0)
MCV: 112.6 fL — AB (ref 80.0–100.0)
MONO ABS: 860 {cells}/uL (ref 200–950)
MPV: 8.4 fL (ref 7.5–12.5)
Monocytes Relative: 10 %
NEUTROS PCT: 38 %
Neutro Abs: 3268 cells/uL (ref 1500–7800)
Platelets: 513 10*3/uL — ABNORMAL HIGH (ref 140–400)
RBC: 2.07 MIL/uL — AB (ref 4.20–5.80)
RDW: 18.3 % — AB (ref 11.0–15.0)
WBC: 8.6 10*3/uL (ref 3.8–10.8)

## 2016-11-24 LAB — THYROID PANEL WITH TSH
Free Thyroxine Index: 2.7 (ref 1.4–3.8)
T3 UPTAKE: 35 % (ref 22–35)
T4, Total: 7.8 ug/dL (ref 4.5–12.0)
TSH: 1.93 mIU/L (ref 0.40–4.50)

## 2016-11-24 LAB — IRON,TIBC AND FERRITIN PANEL
%SAT: 91 % — ABNORMAL HIGH (ref 15–60)
Ferritin: 333 ng/mL (ref 20–345)
Iron: 196 ug/dL — ABNORMAL HIGH (ref 50–195)
TIBC: 215 ug/dL — AB (ref 250–425)

## 2016-11-24 LAB — VITAMIN D 25 HYDROXY (VIT D DEFICIENCY, FRACTURES): VIT D 25 HYDROXY: 10 ng/mL — AB (ref 30–100)

## 2016-11-24 LAB — RETICULOCYTES
ABS Retic: 151110 cells/uL — ABNORMAL HIGH (ref 25000–90000)
RBC.: 2.07 MIL/uL — ABNORMAL LOW (ref 4.20–5.80)
RETIC CT PCT: 7.3 %

## 2016-11-24 NOTE — Addendum Note (Signed)
Addended by: Bing NeighborsHARRIS, Caylin Raby S on: 11/24/2016 01:44 PM   Modules accepted: Orders

## 2016-11-24 NOTE — Telephone Encounter (Signed)
The wrong code was order for the drug screen. Can you put a new order in and patient will come back tomorrow and gvie a sample.  The correct code is [id number]

## 2016-11-25 ENCOUNTER — Telehealth: Payer: Self-pay

## 2016-11-25 DIAGNOSIS — D571 Sickle-cell disease without crisis: Secondary | ICD-10-CM

## 2016-12-06 MED ORDER — FOLIC ACID 1 MG PO TABS: 1.0000 mg | ORAL_TABLET | Freq: Every day | ORAL | 3 refills | Status: DC

## 2016-12-06 MED ORDER — ERGOCALCIFEROL 1.25 MG (50000 UT) PO CAPS: 50000.0000 [IU] | ORAL_CAPSULE | ORAL | 5 refills | Status: DC

## 2016-12-06 NOTE — Addendum Note (Signed)
Addended by: Bing NeighborsHARRIS, Darienne Belleau S on: 12/06/2016 01:58 AM   Modules accepted: Orders

## 2016-12-20 MED ORDER — HYDROXYUREA 500 MG PO CAPS: 1500.0000 mg | ORAL_CAPSULE | Freq: Every day | ORAL | 3 refills | Status: DC

## 2016-12-20 NOTE — Telephone Encounter (Signed)
[facility] does not carry the Endari and it was sent to [facility]

## 2016-12-26 ENCOUNTER — Telehealth: Payer: Self-pay

## 2016-12-26 MED ORDER — L-GLUTAMINE ORAL POWDER: 10.0000 g | PACK | Freq: Two times a day (BID) | ORAL | 5 refills | Status: AC

## 2017-01-24 DIAGNOSIS — D571 Sickle-cell disease without crisis: Secondary | ICD-10-CM | POA: Diagnosis not present

## 2017-02-14 ENCOUNTER — Encounter (HOSPITAL_COMMUNITY): Payer: Self-pay | Admitting: Emergency Medicine

## 2017-02-14 ENCOUNTER — Telehealth (INDEPENDENT_AMBULATORY_CARE_PROVIDER_SITE_OTHER): Payer: Self-pay

## 2017-02-14 ENCOUNTER — Emergency Department (HOSPITAL_COMMUNITY)
Admission: EM | Admit: 2017-02-14 | Discharge: 2017-02-14 | Disposition: A | Discharge status: Home / Self Care | Payer: BLUE CROSS/BLUE SHIELD | Attending: Emergency Medicine | Admitting: Emergency Medicine

## 2017-02-14 ENCOUNTER — Emergency Department (HOSPITAL_COMMUNITY): Payer: BLUE CROSS/BLUE SHIELD

## 2017-02-14 DIAGNOSIS — Z79899 Other long term (current) drug therapy: Secondary | ICD-10-CM | POA: Insufficient documentation

## 2017-02-14 DIAGNOSIS — Y929 Unspecified place or not applicable: Secondary | ICD-10-CM | POA: Diagnosis not present

## 2017-02-14 DIAGNOSIS — M25462 Effusion, left knee: Secondary | ICD-10-CM | POA: Insufficient documentation

## 2017-02-14 DIAGNOSIS — Y999 Unspecified external cause status: Secondary | ICD-10-CM | POA: Diagnosis not present

## 2017-02-14 DIAGNOSIS — R2242 Localized swelling, mass and lump, left lower limb: Secondary | ICD-10-CM | POA: Diagnosis present

## 2017-02-14 DIAGNOSIS — W07XXXA Fall from chair, initial encounter: Secondary | ICD-10-CM | POA: Diagnosis not present

## 2017-02-14 DIAGNOSIS — Y9389 Activity, other specified: Secondary | ICD-10-CM | POA: Insufficient documentation

## 2017-02-14 MED ORDER — OXYCODONE-ACETAMINOPHEN 5-325 MG PO TABS: 1.0000 | ORAL_TABLET | Freq: Four times a day (QID) | ORAL | 0 refills | Status: DC | PRN

## 2017-02-14 NOTE — ED Notes (Signed)
CONTACTED PT [phone]. PT AWARE OF RX PERCOCET AND DISCHARGE PAPERS. PT MADE AWARE DISCHARGE PAPERS WILL BE MADE KNOWN BY THIS WRITER AND CHARGE. PT STATES HE WAS THANKFUL FOR THE CALL AND WILL BY LATER AFTER DR. APPT.

## 2017-02-14 NOTE — ED Notes (Signed)
UPON ARRIVAL TO ROOM, PT NOT IN ROOM. UNABLE TO GIVE DISCHARGE PAPERS. SEVERAL ATTEMPTS TO LOCATE PT IN ED WITHOUT SUCCESS.

## 2017-02-14 NOTE — Telephone Encounter (Signed)
Patient Called Triage phone a few minutes ago to make appt. and tried to call him back no answer. Could not leave a message.

## 2017-02-14 NOTE — ED Triage Notes (Signed)
Pt reports having pain to left knee that started after standing on chair and felt knee pop. Pt states he is having pain with walking.

## 2017-02-14 NOTE — Discharge Instructions (Signed)
Please read attached information regarding your condition and proper management. Wear knee immobilizer and use crutches as directed. Follow-up with orthopedist list below for further evaluation. Return to ED for worsening pain, numbness, weakness, additional injury, color or temperature change of joint.

## 2017-02-14 NOTE — ED Provider Notes (Signed)
[facility]-EMERGENCY DEPT Provider Note   CSN: [id number] Arrival date & time: 02/14/17  0557     History   Chief Complaint Chief Complaint  Patient presents with  . Knee Pain    HPI Matthew Case is a 21 y.o. male.  HPI  Patient, with a past medical history of sickle cell disease, presents to ED for evaluation of left knee pain and swelling that began 30 minutes prior to arrival. He states it as a sharp, constant pain that happened while he was getting down from standing on a chair. He felt his knee pop and moved to the left. He reports pain worse with weightbearing. He has not tried any medications prior to arrival. He denies any previous history of fracture, dislocation or procedures done in this knee. He denies any fevers, chills, color or temperature change of joint.  Past Medical History:  Diagnosis Date  . Sickle cell disease [facility])     Patient Active Problem List   Diagnosis Date Noted  . Vitamin D deficiency 06/06/2014  . Chest pain 12/02/2013  . 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 Sickle cell crisis (HCC) 09/24/2013  . Acute cholecystitis 07/26/2013  . Hyperbilirubinemia 07/26/2013  . Acute chest syndrome (HCC) 04/20/2013  . Sickle cell disease, type SS (HCC) 04/20/2013  . Pleuritic chest pain 04/20/2013    Past Surgical History:  Procedure Laterality Date  . CHOLECYSTECTOMY N/A 07/27/2013   Procedure: LAPAROSCOPIC CHOLECYSTECTOMY ;  Surgeon: Shelly Rubenstein, MD;  Location: [facility] OR;  Service: General;  Laterality: N/A;  . WISDOM TOOTH EXTRACTION         Home Medications    Prior to Admission medications   Medication Sig Start Date End Date Taking? Authorizing Provider  ergocalciferol (VITAMIN D2) 50000 units capsule Take 1 capsule (50,000 Units total) by mouth once a week. 12/06/16   Bing Neighbors, FNP  folic acid (FOLVITE) 1 MG tablet Take 1 tablet (1 mg total) by mouth daily. 12/06/16   Bing Neighbors, FNP  hydroxyurea (HYDREA) 500  MG capsule Take 3 capsules (1,500 mg total) by mouth daily. 12/20/16   Bing Neighbors, FNP  ibuprofen (ADVIL,MOTRIN) 600 MG tablet Take 1 tablet (600 mg total) by mouth every 6 (six) hours as needed for mild pain. 06/03/14   Massie Maroon, FNP  L-glutamine (ENDARI) 5 g PACK Powder Packet Take 10 g by mouth 2 (two) times daily. 12/26/16   Bing Neighbors, FNP  Multiple Vitamin (MULTIVITAMIN WITH MINERALS) TABS tablet Take 1 tablet by mouth daily. 02/24/16   Henrietta Hoover, NP  oxyCODONE-acetaminophen (PERCOCET/ROXICET) 5-325 MG tablet Take 1 tablet by mouth every 6 (six) hours as needed for severe pain. 02/14/17   Dietrich Pates, PA-C    Family History Family History  Problem Relation Age of Onset  . Hypertension Mother   . Diabetes Maternal Grandmother   . Hypertension Maternal Grandmother   . Hypertension Maternal Grandfather   . Diabetes Maternal Grandfather     Social History Social History  Substance Use Topics  . Smoking status: Never Smoker  . Smokeless tobacco: Never Used  . Alcohol use No     Allergies   Patient has no known allergies.   Review of Systems Review of Systems  Constitutional: Negative for chills and fever.  Musculoskeletal: Positive for arthralgias, gait problem and joint swelling. Negative for back pain and myalgias.  Skin: Negative for color change and wound.  Neurological: Negative for dizziness and  headaches.     Physical Exam Updated Vital Signs BP 135/76 (BP Location: Left Arm)   Pulse 89   Temp 98.2 F (36.8 C) (Oral)   Resp 18   Ht 6\' 1"  (1.854 m)   Wt 63.5 kg (140 lb)   SpO2 97%   BMI 18.47 kg/m   Physical Exam  Constitutional: He appears well-developed and well-nourished. No distress.  HENT:  Head: Normocephalic and atraumatic.  Eyes: Conjunctivae and EOM are normal. No scleral icterus.  Neck: Normal range of motion.  Pulmonary/Chest: Effort normal. No respiratory distress.  Musculoskeletal: He exhibits edema and  tenderness. He exhibits no deformity.       Legs: Tenderness to palpation in the indicated area. There is associated swelling noted above and below the patella. Patient is unable to fully flex knee. Unable to perform straight leg raise. Sensation intact to light touch. No color or temperature change of joint.  Neurological: He is alert.  Skin: No rash noted. He is not diaphoretic.  Psychiatric: He has a normal mood and affect.  Nursing note and vitals reviewed.    ED Treatments / Results  Labs (all labs ordered are listed, but only abnormal results are displayed) Labs Reviewed - No data to display  EKG  EKG Interpretation None       Radiology Dg Knee Complete 4 Views Left  Result Date: 02/14/2017 CLINICAL DATA:  Fall.  Pain. EXAM: LEFT KNEE - COMPLETE 4+ VIEW COMPARISON:  No prior. FINDINGS: Prominent knee joint effusion noted. Small bony densities noted along the medial aspect of the patella consistent with small fracture fragments. A very subtle lucency noticed along the posterior aspect of the distal femoral metaphysis. A subtle fracture cannot be excluded. This finding may represent a vascular marking. IMPRESSION: 1. Small bony densities noted along the medial aspect of the patella consistent with small fracture fragments. 2. A very subtle lucency noted along the posterior aspect of the distal femoral metaphysis. Although this may represent a vascular marking, very subtle fracture cannot be excluded. Follow-up exam can be obtained. 3. Prominent knee joint effusion. Electronically Signed   By: Maisie Fus  Register   On: 02/14/2017 06:36    Procedures Procedures (including critical care time)  Medications Ordered in ED Medications - No data to display   Initial Impression / Assessment and Plan / ED Course  I have reviewed the triage vital signs and the nursing notes.  Pertinent labs & imaging results that were available during my care of the patient were reviewed by me and  considered in my medical decision making (see chart for details).     Patient presents to ED for evaluation of left knee pain that began just prior to arrival. He states that he was stepping down from a chair that he was standing when he felt a pop. He denies any previous fracture, dislocation or procedure done in the area. He states pain worse with weightbearing. There is swelling and tenderness noted around the left knee. Unable to perform straight leg raise. No color or temperature change that would make me suspicious for septic joint. We'll obtain x-ray.  0715: X-ray showed small bony densities consistent with small fracture fragments, and findings that could be consistent with subtle fracture. Also showed prominent knee joint effusion. Will place in knee immobilizer, crutches, advise Popov, give pain medication, and give orthopedic follow up. MOI suggests ligamentous injury or tendon injury, less likely for fracture. Patient agrees to plan. He is stable for discharge at  this time. Strict return precautions given.  [place] narcotic database reviewed. Case discussed with Dr. Preston FleetingGlick.   Final Clinical Impressions(s) / ED Diagnoses   Final diagnoses:  Effusion of left knee    New Prescriptions New Prescriptions   OXYCODONE-ACETAMINOPHEN (PERCOCET/ROXICET) 5-325 MG TABLET    Take 1 tablet by mouth every 6 (six) hours as needed for severe pain.     Dietrich PatesKhatri, Takuya Lariccia, PA-C 02/14/17 [id number]    Dione BoozeGlick, David, MD 02/14/17 [phone]

## 2017-02-15 ENCOUNTER — Encounter (INDEPENDENT_AMBULATORY_CARE_PROVIDER_SITE_OTHER): Payer: Self-pay | Admitting: Physician Assistant

## 2017-02-15 ENCOUNTER — Ambulatory Visit (INDEPENDENT_AMBULATORY_CARE_PROVIDER_SITE_OTHER): Payer: BLUE CROSS/BLUE SHIELD | Admitting: Physician Assistant

## 2017-02-15 VITALS — Ht 73.0 in | Wt 140.0 lb

## 2017-02-15 DIAGNOSIS — M25562 Pain in left knee: Secondary | ICD-10-CM | POA: Diagnosis not present

## 2017-02-15 MED ORDER — LIDOCAINE HCL 1 % IJ SOLN
3.0000 mL | INTRAMUSCULAR | Status: AC | PRN
  Administered 2017-02-15: 3 mL

## 2017-02-15 MED ORDER — METHYLPREDNISOLONE ACETATE 40 MG/ML IJ SUSP
40.0000 mg | INTRAMUSCULAR | Status: AC | PRN
  Administered 2017-02-15: 40 mg via INTRA_ARTICULAR

## 2017-02-15 NOTE — Progress Notes (Signed)
Office Visit Note   Patient: Erick ColaceMitchel Linden           Date of Birth: 1996-02-16           MRN: [id number] Visit Date: 02/15/2017              Requested by: Bing NeighborsHarris, Kimberly S, FNP [address] , [place] [id number] PCP: Bing NeighborsHarris, Kimberly S, FNP   Assessment & Plan: Visit Diagnoses:  1. Acute pain of left knee     Plan: He'll work on range of motion strengthening knee. Recommend ice and elevation. He can be weightbearing as tolerated on the knee. We'll see him back in 1 week check his progress lack of. His pain continues or he has any mechanical symptoms obtain an MRI of his knee to rule out internal derangement. Over-the-counter NSAIDs.  Follow-Up Instructions: Return in about 1 week (around 02/22/2017).   Orders:  No orders of the defined types were placed in this encounter.  No orders of the defined types were placed in this encounter.     Procedures: Large Joint Inj Date/Time: 02/15/2017 2:41 PM Performed by: Kirtland BouchardLARK, Jaysten Essner W Authorized by: Kirtland BouchardLARK, Loran Auguste W   Consent Given by:  Patient Indications:  Pain Location:  Knee Site:  L knee Needle Size:  22 G Needle Length:  1.5 inches Approach:  Superolateral Ultrasound Guidance: No   Fluoroscopic Guidance: No   Medications:  40 mg methylPREDNISolone acetate 40 MG/ML; 3 mL lidocaine 1 % Aspiration Attempted: No   Aspirate amount (mL):  105 Aspirate:  Bloody Patient tolerance:  Patient tolerated the procedure well with no immediate complications     Clinical Data: No additional findings.   Subjective: Chief Complaint  Patient presents with  . Left Knee - Injury    HPI 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 No prior injuries to the left knee. No mechanical symptoms the in the past. 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 He was seen in the ER yesterday aware he was given crutches after radiographs of the left knee showed a small fracture fragment along the medial border of the patella. Prominent knee effusion. Review of Systems No shortness of breath chest pain, nausea vomiting loss consciousness or dizziness  Objective: Vital Signs: Ht 6\' 1"  (1.854 m)   Wt 140 lb (63.5 kg)   BMI 18.47 kg/m   Physical Exam  Constitutional: He is oriented to person, place, and time. He appears well-developed and well-nourished. No distress.  Pulmonary/Chest: Effort normal.  Neurological: He is alert and oriented to person, place, and time.  Skin: He is not diaphoretic.  Psychiatric: He has a normal mood and affect. His behavior is normal.    Ortho Exam Left knee full extension flexion to 90 any forced flexion causes pain. He has maximal tenderness along the medial border of the mid patella. Slight tenderness over the medial joint line. Positive effusion. Supple nontender. He is able to straight leg raise. Anterior drawer is negative. No gross instability valgus varus stressing.   Specialty Comments:  No specialty comments available.  Imaging: No results found.   PMFS History: Patient Active Problem List   Diagnosis Date Noted  .  Vitamin D deficiency 06/06/2014  . Chest pain 12/02/2013  . 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 Sickle cell crisis (HCC) 09/24/2013  . Acute cholecystitis 07/26/2013  . Hyperbilirubinemia 07/26/2013  . Acute chest syndrome (HCC) 04/20/2013  . Sickle cell disease, type SS (HCC) 04/20/2013  . Pleuritic chest pain 04/20/2013   Past Medical History:  Diagnosis Date  . Sickle cell disease (HCC)     Family History  Problem Relation Age of Onset  . Hypertension Mother   . Diabetes Maternal Grandmother   . Hypertension Maternal Grandmother   . Hypertension Maternal Grandfather     . Diabetes Maternal Grandfather     Past Surgical History:  Procedure Laterality Date  . CHOLECYSTECTOMY N/A 07/27/2013   Procedure: LAPAROSCOPIC CHOLECYSTECTOMY ;  Surgeon: Shelly Rubenstein, MD;  Location: [facility] OR;  Service: General;  Laterality: N/A;  . WISDOM TOOTH EXTRACTION     Social History   Occupational History  . Not on file.   Social History Main Topics  . Smoking status: Never Smoker  . Smokeless tobacco: Never Used  . Alcohol use No  . Drug use: Yes    Types: Marijuana  . Sexual activity: Yes    Birth control/ protection: Condom

## 2017-02-22 ENCOUNTER — Ambulatory Visit (INDEPENDENT_AMBULATORY_CARE_PROVIDER_SITE_OTHER): Payer: BLUE CROSS/BLUE SHIELD | Admitting: Physician Assistant

## 2017-02-22 DIAGNOSIS — M25562 Pain in left knee: Secondary | ICD-10-CM | POA: Insufficient documentation

## 2017-02-22 NOTE — Progress Notes (Signed)
Matthew Case returns today follow-up of his left knee. States overall that his pain is getting better. Still having some swelling. He is now just a week out from his injury. He is walking without any crutches today. States he has had pain in the knee whenever he then sent to extreme.  Left knee: Full extension flexion to 110 no instability valgus varus stressing. Anterior drawer is negative. McMurray's negative. Positive effusion no abnormal warmth erythema.  Impression:Left knee pain with effusion.  Plan: Left knee is prepped with Betadine and ethyl chloride then from a superior lateral approach 3 mL of Sensorcaine use to further anesthetize the knee and then 45 mL of bloody aspirate is obtained. See him back in 2 weeks check his progress. If he continues to have an effusion or any mechanical symptoms develop of the knee will obtain an MRI.

## 2017-03-08 ENCOUNTER — Ambulatory Visit (INDEPENDENT_AMBULATORY_CARE_PROVIDER_SITE_OTHER): Payer: BLUE CROSS/BLUE SHIELD | Admitting: Physician Assistant

## 2017-03-21 ENCOUNTER — Encounter: Payer: Self-pay | Admitting: Family Medicine

## 2017-03-21 ENCOUNTER — Telehealth: Payer: Self-pay

## 2017-03-21 ENCOUNTER — Ambulatory Visit (INDEPENDENT_AMBULATORY_CARE_PROVIDER_SITE_OTHER): Payer: BLUE CROSS/BLUE SHIELD | Admitting: Family Medicine

## 2017-03-21 VITALS — BP 126/77 | HR 74 | Temp 98.7°F | Resp 14 | Ht 73.0 in | Wt 143.0 lb

## 2017-03-21 DIAGNOSIS — J069 Acute upper respiratory infection, unspecified: Secondary | ICD-10-CM

## 2017-03-21 DIAGNOSIS — D571 Sickle-cell disease without crisis: Secondary | ICD-10-CM

## 2017-03-21 DIAGNOSIS — Z23 Encounter for immunization: Secondary | ICD-10-CM

## 2017-03-21 LAB — POCT URINALYSIS DIP (DEVICE)
Bilirubin Urine: NEGATIVE
Glucose, UA: NEGATIVE mg/dL
HGB URINE DIPSTICK: NEGATIVE
Ketones, ur: NEGATIVE mg/dL
Leukocytes, UA: NEGATIVE
NITRITE: NEGATIVE
PH: 6 (ref 5.0–8.0)
PROTEIN: NEGATIVE mg/dL
Specific Gravity, Urine: 1.015 (ref 1.005–1.030)
UROBILINOGEN UA: 0.2 mg/dL (ref 0.0–1.0)

## 2017-03-21 MED ORDER — ASPIRIN EC 81 MG PO TBEC: 81.0000 mg | DELAYED_RELEASE_TABLET | Freq: Every day | ORAL | 1 refills | Status: DC

## 2017-03-21 MED ORDER — OXYCODONE HCL 10 MG PO TABS: 10.0000 mg | ORAL_TABLET | ORAL | 0 refills | Status: DC | PRN

## 2017-03-21 MED ORDER — OXYCONTIN 10 MG PO T12A: EXTENDED_RELEASE_TABLET | ORAL | 0 refills | Status: DC

## 2017-03-21 MED ORDER — AZITHROMYCIN 250 MG PO TABS: ORAL_TABLET | ORAL | 0 refills | Status: DC

## 2017-03-21 NOTE — Progress Notes (Signed)
Patient ID: Matthew Case, male    DOB: 08-Jul-1995, 21 y.o.   MRN: [id number]  PCP: Bing Neighbors, FNP  Chief Complaint  Patient presents with  . Follow-up    Sickle cell    Subjective:  HPI Matthew Case is a 21 y.o. male , with sickle cell anemia type SS,  presents for evaluation of  3 month follow-up. 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 He is followed by hematology at [facility] and was last seen In July 2018. Matthew Case has not had any hospitalizations or ED visits related to sickle cell anemia, however he was seen and evaluated here at [facility] for left knee pain and swelling 02/14/2017. DVT was ruled out and left knee imaging was significant for subtle fracture and knee effusion. Matthew Case has been followed by Richardean Canal PA at [facility] and has received two knee injections with moderate relief of pain and complete resolution of effusion today.   Matthew Case reports good pain with current home pain medication regimen. He requests a refill today. He reports pain free days several times per month. Matthew Case complains of 1 week history of sore throat with nasal congestion. He denies fever, shortness of breath, or wheezing.   Social History   Social History  . Marital status: Single    Spouse name: Matthew Case  . Number of children: Matthew Case  . Years of education: Matthew Case   Occupational History  . Not on file.   Social History Main Topics  . Smoking status: Never Smoker  . Smokeless tobacco: Never Used  . Alcohol use No  . Drug use: Yes    Types: Marijuana  . Sexual activity: Yes    Birth control/ protection: Condom   Other Topics Concern  . Not on file   Social History Narrative  . No narrative on file    Family History  Problem Relation Age of Onset  . Hypertension Mother   . Diabetes Maternal Grandmother   . Hypertension Maternal Grandmother   . Hypertension Maternal Grandfather   . Diabetes Maternal  Grandfather    Review of Systems See HPI  Patient Active Problem List   Diagnosis Date Noted  . Acute pain of left knee 02/22/2017  . Tonsillar and adenoid hypertrophy 10/07/2014  . Eustachian tube disorder, bilateral 07/08/2014  . Vitamin D deficiency 06/06/2014  . Chest pain 12/02/2013  . 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 Sickle cell crisis (HCC) 09/24/2013  . Acute cholecystitis 07/26/2013  . Hyperbilirubinemia 07/26/2013  . Acute chest syndrome (HCC) 04/20/2013  . Sickle cell disease, type SS (HCC) 04/20/2013  . Pleuritic chest pain 04/20/2013    No Known Allergies  Prior to Admission medications   Medication Sig Start Date End Date Taking? Authorizing Provider  chlorhexidine (PERIDEX) 0.12 % solution USE 1 CAPFULL SWISH AND HOLD X 1 MINUTE THEN EXPECTORATE TWICE A DAY DO NOT EAT OR DRINK/30 MIN AFTE 11/26/15  Yes [provider]  ergocalciferol (VITAMIN D2) 50000 units capsule Take 1 capsule (50,000 Units total) by mouth once a week. 12/06/16  Yes Bing Neighbors, FNP  folic acid (FOLVITE) 1 MG tablet Take 1 tablet (1 mg total) by mouth daily. 12/06/16  Yes Bing Neighbors, FNP  hydroxyurea (HYDREA) 500 MG capsule Take 3 capsules (1,500 mg total) by mouth daily. 12/20/16  Yes Bing Neighbors, FNP  ibuprofen (ADVIL,MOTRIN) 800 MG tablet Take 800  mg by mouth every 8 (eight) hours as needed. 02/08/17  Yes [provider]  Multiple Vitamin (MULTIVITAMIN WITH MINERALS) TABS tablet Take 1 tablet by mouth daily. 02/24/16  Yes Henrietta Hoover, NP  oxyCODONE-acetaminophen (PERCOCET/ROXICET) 5-325 MG tablet Take 1 tablet by mouth every 6 (six) hours as needed for severe pain. 02/14/17  Yes Khatri, Hina, PA-C  OXYCONTIN 10 MG 12 hr tablet TAKE 1 TABLET EVERY 12 HOURS AS NEEDED FOR PERSISTENT PAIN. 02/08/17  Yes [provider]  L-glutamine (ENDARI) 5 g PACK Powder Packet Take 10 g by mouth 2 (two) times daily. Patient not taking: Reported  on 03/21/2017 12/26/16   Bing Neighbors, FNP    Past Medical, Surgical Family and Social History reviewed and updated.    Objective:   Today's Vitals   03/21/17 0836  BP: 126/77  Pulse: 74  Resp: 14  Temp: 98.7 F (37.1 C)  TempSrc: Oral  SpO2: 96%  Weight: 143 lb (64.9 kg)  Height:  (1.854 m)    Wt Readings from Last 3 Encounters:  03/21/17 143 lb (64.9 kg)  02/15/17 140 lb (63.5 kg)  02/14/17 140 lb (63.5 kg)    Physical Exam  Constitutional: He is oriented to person, place, and time. He appears well-developed and well-nourished.  HENT:  Head: Normocephalic and atraumatic.  Right Ear: External ear normal.  Left Ear: External ear normal.  Eyes: Pupils are equal, round, and reactive to light. Conjunctivae and EOM are normal.  Cardiovascular: Normal rate, regular rhythm, normal heart sounds and intact distal pulses.   Pulmonary/Chest: Effort normal and breath sounds normal.  Abdominal: Soft. Bowel sounds are normal. He exhibits no distension. There is no tenderness. There is no rebound and no guarding.  Musculoskeletal: He exhibits no edema.  Mild tenderness noted with deep palpation of the left knee. Negative for effusion   Neurological: He is alert and oriented to person, place, and time.  Skin: Skin is warm and dry.  Psychiatric: He has a normal mood and affect. His behavior is normal. Judgment and thought content normal.   Assessment & Plan:  1. Hb-SS disease without crisis (HCC) *Cntinue Hydrea. We discussed the need for good hydration, monitoring of hydration status, avoidance of heat, cold, stress, and infection triggers. We discussed the risks and benefits of Hydrea, including bone marrow suppression, the possibility of GI upset, skin ulcers, hair thinning, and teratogenicity. The patient was reminded of the need to seek medical attention for any symptoms of bleeding, anemia, or infection. Continue folic acid 1 mg daily to prevent aplastic bone marrow  crises.   Pulmonary evaluation - Patient denies severe recurrent wheezes, shortness of breath with exercise, or persistent cough. If these symptoms develop, pulmonary function tests with spirometry will be ordered, and if abnormal, plan on referral to Pulmonology for further evaluation.  Eye - High risk of proliferative retinopathy. Annual eye exam with retinal exam recommended to patient, the patient has had eye exam this year.  Immunization status - Yearly influenza vaccination is recommended, as well as being up to date with Meningococcal and Pneumococcal vaccines.   Acute and chronic painful episodes - We agreed on Opiate dose and amount of pills  per month. We discussed that pt is to receive Schedule II prescriptions only from our clinic. Pt is also aware that the prescription history is available to [place] online through the [facility] CSRS. Controlled substance agreement reviewed and signed. We reminded  .Ariz Satterly that all patients receiving Schedule  II narcotics must be seen for follow within one month of prescription being requested. We reviewed the terms of our pain agreement, including the need to keep medicines in a safe locked location away from children or pets, and the need to report excess sedation or constipation, measures to avoid constipation, and policies related to early refills and stolen prescriptions. According to the [place] [facility] program, we have reviewed details related to analgesia, adverse effects and aberrant behaviors.    2. Need for influenza vaccination - Flu Vaccine QUAD 36+ mos IM  3. URI -if no improvement within 2-3 days,  start Azithromycin Take 2 tabs x 1 dose, then 1 tab every day for x 4 days   Meds ordered this encounter  Medications  . aspirin EC 81 MG tablet    Sig: Take 1 tablet (81 mg total) by mouth daily.    Dispense:  90 tablet    Refill:  1    Order Specific Question:   Supervising Provider    Answer:   Quentin Angst [id number]   . Oxycodone HCl 10 MG TABS    Sig: Take 1 tablet (10 mg total) by mouth every 4 (four) hours as needed.    Dispense:  60 tablet    Refill:  0    Order Specific Question:   Supervising Provider    Answer:   Quentin Angst [id number]  . OXYCONTIN 10 MG 12 hr tablet    Sig: TAKE 1 TABLET EVERY 12 HOURS AS NEEDED FOR PERSISTENT PAIN.    Dispense:  60 tablet    Refill:  0    Order Specific Question:   Supervising Provider    Answer:   Quentin Angst [id number]  . azithromycin (ZITHROMAX) 250 MG tablet    Sig: Take 2 tabs PO x 1 dose, then 1 tab PO QD x 4 days    Dispense:  6 tablet    Refill:  0    Patient can pick up 9/21 if needed for persistent URI    Order Specific Question:   Supervising Provider    Answer:   Quentin Angst [[id number]]    Return in about 3 months or sooner if needed or for Sickle Cell Disease/Pain.  The patient was given clear instructions to go to ER or return to medical center if symptoms don't improve, worsen or new problems develop. The patient verbalized understanding. The patient was told to call to get lab results if they haven't heard anything in the next week.    Godfrey Pick. Tiburcio Pea, MSN, FNP-C [facility]  [address], [place] [address] [phone]

## 2017-03-21 NOTE — Telephone Encounter (Signed)
-----   Message from Bing Neighbors, FNP sent at 03/21/2017  9:50 AM EDT ----- Vernona Rieger,  Please call Mr. Connors to advise his azithromycin antibiotic is on file at his pharmacy and for him to fill if his symptoms do not improve by Friday

## 2017-03-21 NOTE — Patient Instructions (Addendum)
Contact [facility] to advise your knee has improved, therefore they can update there records.   I am prescribing you Azithromycin, if your cold symptoms do not resolve within 2-3 days, fill and take medication as prescribed.     Sickle Cell Anemia, Adult Sickle cell anemia is a condition where your red blood cells are shaped like sickles. Red blood cells carry oxygen through the body. Sickle-shaped red blood cells do not live as long as normal red blood cells. They also clump together and block blood from flowing through the blood vessels. These things prevent the body from getting enough oxygen. Sickle cell anemia causes organ damage and pain. It also increases the risk of infection. Follow these instructions at home:  Drink enough fluid to keep your pee (urine) clear or pale yellow. Drink more in hot weather and during exercise.  Do not smoke. Smoking lowers oxygen levels in the blood.  Only take over-the-counter or prescription medicines as told by your doctor.  Take antibiotic medicines as told by your doctor. Make sure you finish them even if you start to feel better.  Take supplements as told by your doctor.  Consider wearing a medical alert bracelet. This tells anyone caring for you in an emergency of your condition.  When traveling, keep your medical information, doctors' names, and the medicines you take with you at all times.  If you have a fever, do not take fever medicines right away. This could cover up a problem. Tell your doctor.  Keep all follow-up visits with your doctor. Sickle cell anemia requires regular medical care. Contact a doctor if: You have a fever. Get help right away if:  You feel dizzy or faint.  You have new belly (abdominal) pain, especially on the left side near the stomach area.  You have a lasting, often uncomfortable and painful erection of the penis (priapism). If it is not treated right away, you will become unable to have sex  (impotence).  You have numbness in your arms or legs or you have a hard time moving them.  You have a hard time talking.  You have a fever or lasting symptoms for more than 2-3 days.  You have a fever and your symptoms suddenly get worse.  You have signs or symptoms of infection. These include: ? Chills. ? Being more tired than normal (lethargy). ? Irritability. ? Poor eating. ? Throwing up (vomiting).  You have pain that is not helped with medicine.  You have shortness of breath.  You have pain in your chest.  You are coughing up pus-like or bloody mucus.  You have a stiff neck.  Your feet or hands swell or have pain.  Your belly looks bloated.  Your joints hurt. This information is not intended to replace advice given to you by your health care provider. Make sure you discuss any questions you have with your health care provider. Document Released: 04/10/2013 Document Revised: 11/26/2015 Document Reviewed: 01/30/2013 Elsevier Interactive Patient Education  2017 [employer].

## 2017-03-21 NOTE — Telephone Encounter (Signed)
Called, no answer. Left a message for patient advising him that if his symptoms don't improve by Friday to call pharmacy and have them fill the azithromycin that is on file. Asked if any questions to call back to our office. Thanks!

## 2017-03-26 LAB — PAIN MGMT, PROFILE 8 W/CONF, U
6 ACETYLMORPHINE: NEGATIVE ng/mL (ref <10)
Alcohol Metabolites: NEGATIVE ng/mL (ref <500)
Amphetamines: NEGATIVE ng/mL (ref <500)
BENZODIAZEPINES: NEGATIVE ng/mL (ref <100)
Buprenorphine, Urine: NEGATIVE ng/mL (ref <5)
CREATININE: 79 mg/dL
Cocaine Metabolite: NEGATIVE ng/mL (ref <150)
HYDROCODONE: NEGATIVE ng/mL (ref <50)
HYDROMORPHONE: NEGATIVE ng/mL (ref <50)
MARIJUANA METABOLITE: 935 ng/mL — AB (ref <5)
MDMA: NEGATIVE ng/mL (ref <500)
MORPHINE: NEGATIVE ng/mL (ref <50)
Marijuana Metabolite: POSITIVE ng/mL — AB (ref <20)
Norhydrocodone: NEGATIVE ng/mL (ref <50)
Noroxycodone: 1392 ng/mL — ABNORMAL HIGH (ref <50)
OXIDANT: NEGATIVE ug/mL (ref <200)
OXYCODONE: 615 ng/mL — AB (ref <50)
OXYCODONE: POSITIVE ng/mL — AB (ref <100)
Opiates: NEGATIVE ng/mL (ref <100)
Oxymorphone: 1175 ng/mL — ABNORMAL HIGH (ref <50)
PH: 6.44 (ref 4.5–9.0)

## 2017-03-29 ENCOUNTER — Telehealth: Payer: Self-pay

## 2017-03-29 DIAGNOSIS — J018 Other acute sinusitis: Secondary | ICD-10-CM | POA: Diagnosis not present

## 2017-03-29 DIAGNOSIS — H6523 Chronic serous otitis media, bilateral: Secondary | ICD-10-CM | POA: Diagnosis not present

## 2017-04-03 NOTE — Telephone Encounter (Signed)
Please contact patient to advise that his letter for school is ready for pick-up.   Godfrey Pick. Tiburcio Pea, MSN, FNP-C The [facility]  [address] [place] [place], [place] [address] [phone]

## 2017-05-03 ENCOUNTER — Telehealth: Payer: Self-pay

## 2017-05-03 DIAGNOSIS — E559 Vitamin D deficiency, unspecified: Secondary | ICD-10-CM

## 2017-05-03 DIAGNOSIS — D571 Sickle-cell disease without crisis: Secondary | ICD-10-CM

## 2017-05-03 MED ORDER — OXYCODONE HCL 10 MG PO TABS: 10.0000 mg | ORAL_TABLET | ORAL | 0 refills | Status: DC | PRN

## 2017-05-03 MED ORDER — FOLIC ACID 1 MG PO TABS: 1.0000 mg | ORAL_TABLET | Freq: Every day | ORAL | 3 refills | Status: DC

## 2017-05-03 MED ORDER — ERGOCALCIFEROL 1.25 MG (50000 UT) PO CAPS: 50000.0000 [IU] | ORAL_CAPSULE | ORAL | 5 refills | Status: DC

## 2017-05-03 MED ORDER — HYDROXYUREA 500 MG PO CAPS: 1500.0000 mg | ORAL_CAPSULE | Freq: Every day | ORAL | 3 refills | Status: DC

## 2017-05-03 MED ORDER — OXYCONTIN 10 MG PO T12A: EXTENDED_RELEASE_TABLET | ORAL | 0 refills | Status: DC

## 2017-05-03 MED ORDER — ASPIRIN EC 81 MG PO TBEC: 81.0000 mg | DELAYED_RELEASE_TABLET | Freq: Every day | ORAL | 1 refills | Status: DC

## 2017-05-03 NOTE — Telephone Encounter (Signed)
Refilled the medication as patient medication were misplaced. Checked the control substance registry, controlled medication are ok to be refilled. Advise patient he make pick-up medications on Friday  Matthew Case S. Tiburcio PeaHarris, MSN, FNP-C The Patient Care [facility]  [address] [place], [place] [address] [phone]

## 2017-05-04 NOTE — Telephone Encounter (Signed)
Left a vm that scripts will be ready for pick up on Friday 05/05/2017.

## 2017-05-22 ENCOUNTER — Ambulatory Visit: Payer: PRIVATE HEALTH INSURANCE | Admitting: Family Medicine

## 2017-06-09 ENCOUNTER — Ambulatory Visit: Payer: PRIVATE HEALTH INSURANCE | Admitting: Family Medicine

## 2017-08-08 ENCOUNTER — Ambulatory Visit (INDEPENDENT_AMBULATORY_CARE_PROVIDER_SITE_OTHER): Payer: BLUE CROSS/BLUE SHIELD | Admitting: Family Medicine

## 2017-08-08 ENCOUNTER — Encounter: Payer: Self-pay | Admitting: Family Medicine

## 2017-08-08 VITALS — BP 114/60 | HR 72 | Temp 97.9°F | Resp 14 | Ht 73.0 in | Wt 148.6 lb

## 2017-08-08 DIAGNOSIS — D571 Sickle-cell disease without crisis: Secondary | ICD-10-CM | POA: Diagnosis not present

## 2017-08-08 DIAGNOSIS — J069 Acute upper respiratory infection, unspecified: Secondary | ICD-10-CM

## 2017-08-08 DIAGNOSIS — G894 Chronic pain syndrome: Secondary | ICD-10-CM

## 2017-08-08 DIAGNOSIS — E559 Vitamin D deficiency, unspecified: Secondary | ICD-10-CM | POA: Diagnosis not present

## 2017-08-08 LAB — POCT URINALYSIS DIP (DEVICE)
Bilirubin Urine: NEGATIVE
Glucose, UA: NEGATIVE mg/dL
HGB URINE DIPSTICK: NEGATIVE
Ketones, ur: NEGATIVE mg/dL
NITRITE: NEGATIVE
Protein, ur: NEGATIVE mg/dL
SPECIFIC GRAVITY, URINE: 1.015 (ref 1.005–1.030)
Urobilinogen, UA: 0.2 mg/dL (ref 0.0–1.0)
pH: 6 (ref 5.0–8.0)

## 2017-08-08 MED ORDER — FOLIC ACID 1 MG PO TABS: 1.0000 mg | ORAL_TABLET | Freq: Every day | ORAL | 3 refills | Status: DC

## 2017-08-08 MED ORDER — ASPIRIN EC 81 MG PO TBEC: 81.0000 mg | DELAYED_RELEASE_TABLET | Freq: Every day | ORAL | 1 refills | Status: AC

## 2017-08-08 MED ORDER — HYDROXYUREA 500 MG PO CAPS: 1500.0000 mg | ORAL_CAPSULE | Freq: Every day | ORAL | 3 refills | Status: DC

## 2017-08-08 MED ORDER — OXYCONTIN 10 MG PO T12A: EXTENDED_RELEASE_TABLET | ORAL | 0 refills | Status: DC

## 2017-08-08 MED ORDER — OXYCODONE HCL 10 MG PO TABS: 10.0000 mg | ORAL_TABLET | ORAL | 0 refills | Status: DC | PRN

## 2017-08-08 MED ORDER — AZITHROMYCIN 250 MG PO TABS: ORAL_TABLET | ORAL | 0 refills | Status: DC

## 2017-08-08 MED ORDER — ERGOCALCIFEROL 1.25 MG (50000 UT) PO CAPS: 50000.0000 [IU] | ORAL_CAPSULE | ORAL | 5 refills | Status: DC

## 2017-08-08 NOTE — Patient Instructions (Signed)
Sickle Cell Anemia, Adult °Sickle cell anemia is a condition where your red blood cells are shaped like sickles. Red blood cells carry oxygen through the body. Sickle-shaped red blood cells do not live as long as normal red blood cells. They also clump together and block blood from flowing through the blood vessels. These things prevent the body from getting enough oxygen. Sickle cell anemia causes organ damage and pain. It also increases the risk of infection. °Follow these instructions at home: °· Drink enough fluid to keep your pee (urine) clear or pale yellow. Drink more in hot weather and during exercise. °· Do not smoke. Smoking lowers oxygen levels in the blood. °· Only take over-the-counter or prescription medicines as told by your doctor. °· Take antibiotic medicines as told by your doctor. Make sure you finish them even if you start to feel better. °· Take supplements as told by your doctor. °· Consider wearing a medical alert bracelet. This tells anyone caring for you in an emergency of your condition. °· When traveling, keep your medical information, doctors' names, and the medicines you take with you at all times. °· If you have a fever, do not take fever medicines right away. This could cover up a problem. Tell your doctor. °· Keep all follow-up visits with your doctor. Sickle cell anemia requires regular medical care. °Contact a doctor if: °You have a fever. °Get help right away if: °· You feel dizzy or faint. °· You have new belly (abdominal) pain, especially on the left side near the stomach area. °· You have a lasting, often uncomfortable and painful erection of the penis (priapism). If it is not treated right away, you will become unable to have sex (impotence). °· You have numbness in your arms or legs or you have a hard time moving them. °· You have a hard time talking. °· You have a fever or lasting symptoms for more than 2-3 days. °· You have a fever and your symptoms suddenly get  worse. °· You have signs or symptoms of infection. These include: °? Chills. °? Being more tired than normal (lethargy). °? Irritability. °? Poor eating. °? Throwing up (vomiting). °· You have pain that is not helped with medicine. °· You have shortness of breath. °· You have pain in your chest. °· You are coughing up pus-like or bloody mucus. °· You have a stiff neck. °· Your feet or hands swell or have pain. °· Your belly looks bloated. °· Your joints hurt. °This information is not intended to replace advice given to you by your health care provider. Make sure you discuss any questions you have with your health care provider. °Document Released: 04/10/2013 Document Revised: 11/26/2015 Document Reviewed: 01/30/2013 °Elsevier Interactive Patient Education © 2017 Elsevier Inc. ° °

## 2017-08-08 NOTE — Progress Notes (Signed)
Patient ID: Matthew Case, male    DOB: 05-Jul-1995, 22 y.o.   MRN: [id number]  PCP: Bing Neighbors, FNP  Chief Complaint  Patient presents with  . Follow-up    sickle cell    Subjective:  HPI Matthew Case is a 22 y.o. 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 anemia, presents for routine chronic condition and medication management visit. 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 in [place]. His last office visit at [facility] 07/17/2017. All labs results were consist with baseline. Hemoglobin 8.4 which is consistent with baseline. Matthew Case is opioid naive as he only obtains pain medication prescription on average every 3 months and consistently reports infrequent use of pain medication. Recent UDS from [facility] positive for oxycodone and cannabis. He receive a pain medication prescription 07/17/2017 during his recent office visit at [facility]. Matthew Case infrequently visits the ED. Sickle cell anemia is well controlled. He complains today of 2 weeks of URI symptoms consisting of nasal congestion, cough, sore throat, wheezing, or shortness of breath. Cough is occasionally productive of phlegm which is clear to yellow. Denies fever, chest tightness, and dysuria. Social History   Socioeconomic History  . Marital status: Single    Spouse name: Not on file  . Number of children: Not on file  . Years of education: Not on file  . Highest education level: Not on file  Social Needs  . Financial resource strain: Not on file  . Food insecurity - worry: Not on file  . Food insecurity - inability: Not on file  . Transportation needs - medical: Not on file  . Transportation needs - non-medical: Not on file  Occupational History  . Not on file  Tobacco Use  . Smoking status: Never Smoker  . Smokeless tobacco: Never Used  Substance and Sexual Activity  . Alcohol use: No  . Drug use: Yes    Types: Marijuana  . Sexual activity: Yes    Birth  control/protection: Condom  Other Topics Concern  . Not on file  Social History Narrative  . Not on file    Family History  Problem Relation Age of Onset  . Hypertension Mother   . Diabetes Maternal Grandmother   . Hypertension Maternal Grandmother   . Hypertension Maternal Grandfather   . Diabetes Maternal Grandfather    Review of Systems  Patient Active Problem List   Diagnosis Date Noted  . Acute pain of left knee 02/22/2017  . Tonsillar and adenoid hypertrophy 10/07/2014  . Eustachian tube disorder, bilateral 07/08/2014  . Vitamin D deficiency 06/06/2014  . Chest pain 12/02/2013  . 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 Sickle cell crisis (HCC) 09/24/2013  . Acute cholecystitis 07/26/2013  . Hyperbilirubinemia 07/26/2013  . Acute chest syndrome (HCC) 04/20/2013  . Sickle cell disease, type SS (HCC) 04/20/2013  . Pleuritic chest pain 04/20/2013    No Known Allergies  Prior to Admission medications   Medication Sig Start Date End Date Taking? Authorizing Provider  hydroxyurea (HYDREA) 500 MG capsule Take 3 capsules (1,500 mg total) by mouth daily. 05/03/17  Yes Bing Neighbors, FNP  ibuprofen (ADVIL,MOTRIN) 800 MG tablet Take 800 mg by mouth every 8 (eight) hours as needed. 02/08/17  Yes [provider]  Oxycodone HCl 10 MG TABS Take 1 tablet (10 mg total) by mouth every 4 (four) hours as needed. 05/03/17  Yes Bing Neighbors, FNP  OXYCONTIN 10 MG 12 hr tablet TAKE 1 TABLET EVERY 12 HOURS AS NEEDED FOR PERSISTENT PAIN. 05/03/17  Yes Bing NeighborsHarris, Carnesha Maravilla S, FNP  aspirin EC 81 MG tablet Take 1 tablet (81 mg total) by mouth daily. Patient not taking: Reported on 08/08/2017 05/03/17   Bing NeighborsHarris, Keara Pagliarulo S, FNP  azithromycin (ZITHROMAX) 250 MG tablet Take 2 tabs PO x 1 dose, then 1 tab PO QD x 4 days 03/21/17   Bing NeighborsHarris, Marjon Doxtater S, FNP  chlorhexidine (PERIDEX) 0.12 % solution USE 1 CAPFULL SWISH AND HOLD X 1 MINUTE THEN EXPECTORATE TWICE A DAY DO NOT EAT  OR DRINK/30 MIN AFTE 11/26/15   [provider]  ergocalciferol (VITAMIN D2) 50000 units capsule Take 1 capsule (50,000 Units total) by mouth once a week. Patient not taking: Reported on 08/08/2017 05/03/17   Bing NeighborsHarris, Shira Bobst S, FNP  folic acid (FOLVITE) 1 MG tablet Take 1 tablet (1 mg total) by mouth daily. Patient not taking: Reported on 08/08/2017 05/03/17   Bing NeighborsHarris, Jayah Balthazar S, FNP  L-glutamine (ENDARI) 5 g PACK Powder Packet Take 10 g by mouth 2 (two) times daily. Patient not taking: Reported on 08/08/2017 12/26/16   Bing NeighborsHarris, Alarik Radu S, FNP  Multiple Vitamin (MULTIVITAMIN WITH MINERALS) TABS tablet Take 1 tablet by mouth daily. Patient not taking: Reported on 08/08/2017 02/24/16   Henrietta HooverBernhardt, Linda C, NP  naloxone [facility](NARCAN) nasal spray 4 mg/0.1 mL Place 4 mg into the nose once. 07/17/17   [provider]    Past Medical, Surgical Family and Social History reviewed and updated.    Objective:   Today's Vitals   08/08/17 1051  BP: 114/60  Pulse: 72  Resp: 14  Temp: 97.9 F (36.6 C)  TempSrc: Oral  SpO2: 100%  Weight: 148 lb 9.6 oz (67.4 kg)  Height: 6\' 1"  (1.854 m)    Wt Readings from Last 3 Encounters:  08/08/17 148 lb 9.6 oz (67.4 kg)  03/21/17 143 lb (64.9 kg)  02/15/17 140 lb (63.5 kg)   Physical Exam Constitutional: He is oriented to person, place, and time. He appears well-developed and well-nourished.  HENT: Head: Normocephalic and atraumatic. Right Ear: External ear normal. Left Ear: External ear normal. Bilateral nasal edema with rhinorrhea present. Oropharyngeal negative of erythema or edema.  Eyes: Pupils are equal, round, and reactive to light. Conjunctivae and EOM are normal.  Cardiovascular: Normal rate, regular rhythm, normal heart sounds and intact distal pulses.   Pulmonary/Chest: Effort normal and breath sounds normal.  Abdominal: Soft. Bowel sounds are normal. He exhibits no distension. There is no tenderness. There is no rebound and no guarding.   Musculoskeletal: He exhibits no edema.  Neurological: He is alert and oriented to person, place, and time.  Skin: Skin is warm and dry.  Psychiatric: He has a normal mood and affect. His behavior is normal. Judgment and thought content normal.    Assessment & Plan:  1. Sickle cell disease, type SS (HCC), controlled Continue Hydrea. We discussed the need for good hydration, monitoring of hydration status, avoidance of heat, cold, stress, and infection triggers. We discussed the risks and benefits of Hydrea, including bone marrow suppression, the possibility of GI upset, skin ulcers, hair thinning, and teratogenicity. The patient was reminded of the need to seek medical attention for any symptoms of bleeding, anemia, or infection. Continue folic acid 1 mg daily to prevent aplastic bone marrow crises.   Pulmonary evaluation - Patient denies severe recurrent wheezes, shortness of breath with exercise, or persistent cough. If these  symptoms develop, pulmonary function tests with spirometry will be ordered, and if abnormal, plan on referral to Pulmonology for further evaluation.  Eye - High risk of proliferative retinopathy. Annual eye exam with retinal exam recommended to patient, the patient has had eye exam this year.  Immunization status - Yearly influenza vaccination is recommended, as well as being up to date with Meningococcal and Pneumococcal vaccines.  Reviewed labs dated 07/17/17 from recent hematology visit, which are consistent with baseline.    2. Vitamin D deficiency, chronic ongoing -continue  ergocalciferol (VITAMIN D2) 50000 units capsule   3. Upper respiratory tract infection, unspecified type, symptoms persisting for greater than 1 week. Will trial a course of Azithromycin (dose pack). If no improvement return for follow-up.  4. Chronic pain syndrome, recently filled a prescription from [facility] hematology clinic.  Patient signed a new medication contract with our office  today.  Advised patient of the date in which he would be eligible for new prescription.  He is to call and to request a medication refill at that time.  Recent UDS taken on 07/17/2017 was significant for positive cannabis and oxycodone which he is prescribe   RTC: 6 months for chronic condition follow-up    Godfrey Pick. Tiburcio Pea, MSN, FNP-C The Patient Care [facility]  [address] [place], [place] [address] [phone]

## 2017-08-23 ENCOUNTER — Telehealth: Payer: Self-pay

## 2017-08-24 NOTE — Telephone Encounter (Signed)
Patient notified

## 2017-08-25 MED ORDER — OXYCODONE HCL 10 MG PO TABS: 10.0000 mg | ORAL_TABLET | ORAL | 0 refills | Status: DC | PRN

## 2017-08-25 MED ORDER — IBUPROFEN 800 MG PO TABS: 800.0000 mg | ORAL_TABLET | Freq: Three times a day (TID) | ORAL | 0 refills | Status: DC | PRN

## 2017-08-25 MED ORDER — OXYCONTIN 10 MG PO T12A: EXTENDED_RELEASE_TABLET | ORAL | 0 refills | Status: DC

## 2017-08-25 NOTE — Telephone Encounter (Signed)
Refilled meds per request with fill date of 08/25/17

## 2017-11-09 ENCOUNTER — Telehealth: Payer: Self-pay

## 2017-11-13 ENCOUNTER — Ambulatory Visit (INDEPENDENT_AMBULATORY_CARE_PROVIDER_SITE_OTHER): Payer: BLUE CROSS/BLUE SHIELD | Admitting: Family Medicine

## 2017-11-13 ENCOUNTER — Encounter: Payer: Self-pay | Admitting: Family Medicine

## 2017-11-13 VITALS — BP 126/70 | HR 68 | Temp 98.4°F | Resp 16 | Ht 73.0 in | Wt 141.0 lb

## 2017-11-13 DIAGNOSIS — E559 Vitamin D deficiency, unspecified: Secondary | ICD-10-CM

## 2017-11-13 DIAGNOSIS — D571 Sickle-cell disease without crisis: Secondary | ICD-10-CM | POA: Diagnosis not present

## 2017-11-13 MED ORDER — OXYCONTIN 10 MG PO T12A: EXTENDED_RELEASE_TABLET | ORAL | 0 refills | Status: DC

## 2017-11-13 MED ORDER — OXYCODONE HCL 10 MG PO TABS: 10.0000 mg | ORAL_TABLET | ORAL | 0 refills | Status: DC | PRN

## 2017-11-13 MED ORDER — ERGOCALCIFEROL 1.25 MG (50000 UT) PO CAPS: 50000.0000 [IU] | ORAL_CAPSULE | ORAL | 5 refills | Status: AC

## 2017-11-13 MED ORDER — IBUPROFEN 600 MG PO TABS: 600.0000 mg | ORAL_TABLET | Freq: Three times a day (TID) | ORAL | 2 refills | Status: DC | PRN

## 2017-11-13 MED ORDER — FOLIC ACID 1 MG PO TABS: 1.0000 mg | ORAL_TABLET | Freq: Every day | ORAL | 3 refills | Status: AC

## 2017-11-13 NOTE — Progress Notes (Signed)
Subjective:    Patient ID: Matthew Case, male    DOB: 1995-11-25, 22 y.o.   MRN: [id number]  HPI Matthew Case, a 22 year old male with a history of sickle cell anemia, HbSS presents for a follow up. 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 Matthew Case will be returning to his home in [place], [place] this summer. He continues to follow up with Dr. Lodema Hong, hematology when he returns home. Patient has a history of chronic pain and is opiate tolerant. He takes Oxycontin every 12 hours and Oxycodone 10 mg every 6 hours as needed. Current pain intensity is 4/10 characterized as intermittent and aching. Patient has been taking hydrea consistently. He does not take folic acid as prescribed. He says that he has not been hospitalized for a sickle crisis in greater than 1 year. Pain is primarily to lower back. Patient denies headache, fatigue, paresthesias, dysuria, nausea, vomiting, or diarrhea.   Past Medical History:  Diagnosis Date  . Sickle cell disease (HCC)    Social History   Socioeconomic History  . Marital status: Single    Spouse name: Not on file  . Number of children: Not on file  . Years of education: Not on file  . Highest education level: Not on file  Occupational History  . Not on file  Social Needs  . Financial resource strain: Not on file  . Food insecurity:    Worry: Not on file    Inability: Not on file  . Transportation needs:    Medical: Not on file    Non-medical: Not on file  Tobacco Use  . Smoking status: Never Smoker  . Smokeless tobacco: Never Used  Substance and Sexual Activity  . Alcohol use: No  . Drug use: Yes    Types: Marijuana  . Sexual activity: Yes    Birth control/protection: Condom  Lifestyle  . Physical activity:    Days per week: Not on file    Minutes per session: Not on file  . Stress: Not on file  Relationships  . Social connections:    Talks on phone: Not on file    Gets together: Not on  file    Attends religious service: Not on file    Active member of club or organization: Not on file    Attends meetings of clubs or organizations: Not on file    Relationship status: Not on file  . Intimate partner violence:    Fear of current or ex partner: Not on file    Emotionally abused: Not on file    Physically abused: Not on file    Forced sexual activity: Not on file  Other Topics Concern  . Not on file  Social History Narrative  . Not on file    Review of Systems  Constitutional: Negative.   HENT: Negative.   Eyes: Negative.   Respiratory: Negative.   Cardiovascular: Negative.   Gastrointestinal: Negative.   Endocrine: Negative.   Genitourinary: Negative.   Musculoskeletal: Positive for arthralgias and myalgias.  Allergic/Immunologic: Negative.   Neurological: Negative.   Hematological: Negative.        Objective:   Physical Exam  Constitutional: He is oriented to person, place, and time. He appears well-developed and well-nourished.  HENT:  Head: Normocephalic and atraumatic.  Right Ear: External ear normal.  Left Ear: External ear normal.  Nose: Nose normal.  Mouth/Throat: Oropharynx is clear and moist.  Eyes: Pupils are equal, round, and  reactive to light.  Neck: Normal range of motion.  Cardiovascular: Normal rate, regular rhythm and normal heart sounds.  Pulmonary/Chest: Effort normal and breath sounds normal.  Abdominal: Soft. Bowel sounds are normal.  Musculoskeletal: Normal range of motion.  Neurological: He is alert and oriented to person, place, and time.  Skin: Skin is warm.  Psychiatric: He has a normal mood and affect. His behavior is normal. Judgment and thought content normal.         BP 126/70 (BP Location: Right Arm, Patient Position: Sitting, Cuff Size: Normal)   Pulse 68   Temp 98.4 F (36.9 C) (Oral)   Resp 16   Ht  (1.854 m)   Wt 141 lb (64 kg)   SpO2 100%   BMI 18.60 kg/m  Assessment & Plan:  Sickle cell disease,  type SS (HCC) Continue Hydrea as prescribed. Will check CBC for absolute neutrophil count and platelets. Will also check reticulocyte count. Will consider increasing dosage. We discussed the need for good hydration, monitoring of hydration status, avoidance of heat, cold, stress, and infection triggers. We discussed the risks and benefits of Hydrea, including bone marrow suppression, the possibility of GI upset, skin ulcers, hair thinning, and teratogenicity. Reminded Matthew Case of the needed to use contraception while on hydroxyurea due to teratogenicity. The patient was reminded of the need to seek medical attention of any symptoms of bleeding, anemia, or infection. Will start folic acid 1 mg daily to prevent aplastic bone marrow crises.   Pulmonary evaluation - Patient denies severe recurrent wheezes, shortness of breath with exercise, or persistent cough. If these symptoms develop, pulmonary function tests with spirometry will be ordered, and if abnormal, plan on referral to Pulmonology for further evaluation.  Cardiac - Routine screening for pulmonary hypertension is not recommended.  Eye - High risk of proliferative retinopathy. Annual eye exam with retinal exam recommended to patient. Last eye examination 6 months ago  Immunization status - Patient will receive influenza vaccination on today  Acute and chronic painful episodes - We agreed on current medication regimen. P We discussed that pt is to receive her Schedule II prescriptions only from [place]. Pt is also aware that the prescription history is available to [place] online through the [facility] CSRS. Controlled substance agreement signed today. We reminded Matthew Case that all patients receiving Schedule II narcotics must be seen for follow within one month of prescription being requested. We reviewed the terms of our pain agreement, including the need to keep medicines in a safe locked location away from children or pets, and the need to report excess sedation  or constipation, measures to avoid constipation, and policies related to early refills and stolen prescriptions. According to the [place] [facility] program, we have reviewed details related to analgesia, adverse effects, aberrant behaviors. Reviewed [place] Substance Reporting system prior to prescribing opiate medication, no inconsistencies noted.    - Iron overload from chronic transfusion. Will check ferritin levels. She has received blood transfusions in the past.   - 161096 9+OXYCODONE+CRT-UNBUND; Future - ibuprofen (ADVIL,MOTRIN) 600 MG tablet; Take 1 tablet (600 mg total) by mouth every 8 (eight) hours as needed.  Dispense: 30 tablet; Refill: 2 - OXYCONTIN 10 MG 12 hr tablet; TAKE 1 TABLET EVERY 12 HOURS AS NEEDED FOR PERSISTENT PAIN.  Dispense: 60 tablet; Refill: 0 - Oxycodone HCl 10 MG TABS; Take 1 tablet (10 mg total) by mouth every 4 (four) hours as needed.  Dispense: 60 tablet; Refill: 0 - folic  acid (FOLVITE) 1 MG tablet; Take 1 tablet (1 mg total) by mouth daily.  Dispense: 90 tablet; Refill: 3 - Comprehensive metabolic panel - 161096 9+OXYCODONE+CRT-UNBUND  Vitamin D deficiency  - Vitamin D, 25-hydroxy - ergocalciferol (VITAMIN D2) 50000 units capsule; Take 1 capsule (50,000 Units total) by mouth once a week.  Dispense: 30 capsule; Refill: 5   RTC: 3 months for medication management   The patient was given clear instructions to go to ER or return to medical center if symptoms do not improve, worsen or new problems develop. The patient verbalized understanding.     Nolon Nations  MSN, FNP-C Patient Care [facility] [address] [place], [place] [address] [phone]

## 2017-11-13 NOTE — Patient Instructions (Signed)
Sickle Cell Anemia, Adult °Sickle cell anemia is a condition where your red blood cells are shaped like sickles. Red blood cells carry oxygen through the body. Sickle-shaped red blood cells do not live as long as normal red blood cells. They also clump together and block blood from flowing through the blood vessels. These things prevent the body from getting enough oxygen. Sickle cell anemia causes organ damage and pain. It also increases the risk of infection. °Follow these instructions at home: °· Drink enough fluid to keep your pee (urine) clear or pale yellow. Drink more in hot weather and during exercise. °· Do not smoke. Smoking lowers oxygen levels in the blood. °· Only take over-the-counter or prescription medicines as told by your doctor. °· Take antibiotic medicines as told by your doctor. Make sure you finish them even if you start to feel better. °· Take supplements as told by your doctor. °· Consider wearing a medical alert bracelet. This tells anyone caring for you in an emergency of your condition. °· When traveling, keep your medical information, doctors' names, and the medicines you take with you at all times. °· If you have a fever, do not take fever medicines right away. This could cover up a problem. Tell your doctor. °· Keep all follow-up visits with your doctor. Sickle cell anemia requires regular medical care. °Contact a doctor if: °You have a fever. °Get help right away if: °· You feel dizzy or faint. °· You have new belly (abdominal) pain, especially on the left side near the stomach area. °· You have a lasting, often uncomfortable and painful erection of the penis (priapism). If it is not treated right away, you will become unable to have sex (impotence). °· You have numbness in your arms or legs or you have a hard time moving them. °· You have a hard time talking. °· You have a fever or lasting symptoms for more than 2-3 days. °· You have a fever and your symptoms suddenly get  worse. °· You have signs or symptoms of infection. These include: °? Chills. °? Being more tired than normal (lethargy). °? Irritability. °? Poor eating. °? Throwing up (vomiting). °· You have pain that is not helped with medicine. °· You have shortness of breath. °· You have pain in your chest. °· You are coughing up pus-like or bloody mucus. °· You have a stiff neck. °· Your feet or hands swell or have pain. °· Your belly looks bloated. °· Your joints hurt. °This information is not intended to replace advice given to you by your health care provider. Make sure you discuss any questions you have with your health care provider. °Document Released: 04/10/2013 Document Revised: 11/26/2015 Document Reviewed: 01/30/2013 °Elsevier Interactive Patient Education © 2017 Elsevier Inc. ° °

## 2017-11-14 ENCOUNTER — Telehealth: Payer: Self-pay

## 2017-11-14 LAB — COMPREHENSIVE METABOLIC PANEL
ALBUMIN: 4.9 g/dL (ref 3.5–5.5)
ALK PHOS: 62 IU/L (ref 39–117)
ALT: 17 IU/L (ref 0–44)
AST: 25 IU/L (ref 0–40)
Albumin/Globulin Ratio: 2.1 (ref 1.2–2.2)
BILIRUBIN TOTAL: 2.2 mg/dL — AB (ref 0.0–1.2)
BUN/Creatinine Ratio: 13 (ref 9–20)
BUN: 9 mg/dL (ref 6–20)
CHLORIDE: 106 mmol/L (ref 96–106)
CO2: 20 mmol/L (ref 20–29)
Calcium: 9.2 mg/dL (ref 8.7–10.2)
Creatinine, Ser: 0.69 mg/dL — ABNORMAL LOW (ref 0.76–1.27)
GFR calc Af Amer: 156 mL/min/{1.73_m2} (ref >59)
GFR calc non Af Amer: 135 mL/min/{1.73_m2} (ref >59)
GLUCOSE: 99 mg/dL (ref 65–99)
Globulin, Total: 2.3 g/dL (ref 1.5–4.5)
Potassium: 4.3 mmol/L (ref 3.5–5.2)
Sodium: 140 mmol/L (ref 134–144)
Total Protein: 7.2 g/dL (ref 6.0–8.5)

## 2017-11-14 LAB — VITAMIN D 25 HYDROXY (VIT D DEFICIENCY, FRACTURES): VIT D 25 HYDROXY: 11.4 ng/mL — AB (ref 30.0–100.0)

## 2017-11-14 NOTE — Telephone Encounter (Signed)
Called, spoke with patient, advised that vitamin D levels are low and that he should take vitamin D once weekly consistently. Patient verbalized understanding. Thanks!

## 2017-11-14 NOTE — Telephone Encounter (Signed)
-----   Message from Massie Maroon, [place] sent at 11/14/2017  8:59 AM EDT ----- Regarding: lab results Please inform patient that vitamin D is decreased which is consistent with deficiency. Continue weekly Drisdol as prescribed. All other labs are consistent with baseline.   Nolon Nations  MSN, FNP-C Patient Care [facility] [address] [place], [place] [address] [phone]

## 2017-11-20 LAB — 737588 9+OXYCODONE+CRT-UNBUND
AMPHETAMINE SCREEN URINE: NEGATIVE ng/mL
BARBITURATE SCREEN URINE: NEGATIVE ng/mL
BENZODIAZEPINE SCREEN, URINE: NEGATIVE ng/mL
Cocaine (Metab) Scrn, Ur: NEGATIVE ng/mL
Creatinine(Crt), U: 144.5 mg/dL (ref 20.0–300.0)
Methadone Screen, Urine: NEGATIVE ng/mL
OPIATE SCREEN URINE: NEGATIVE ng/mL
PROPOXYPHENE SCREEN URINE: NEGATIVE ng/mL
Ph of Urine: 5.8 (ref 4.5–8.9)
Phencyclidine Qn, Ur: NEGATIVE ng/mL

## 2017-11-20 LAB — OXYCODONE/OXYMORPHONE CONFIRM
OXYCODONE/OXYMORPH: POSITIVE — AB
OXYCODONE: NEGATIVE
OXYMORPHONE CONFIRM: 590 ng/mL
OXYMORPHONE: POSITIVE — AB

## 2017-11-20 LAB — CANNABINOID (GC/MS), URINE
CANNABINOID UR: POSITIVE — AB
Carboxy THC (GC/MS): >400 ng/mL

## 2018-02-06 ENCOUNTER — Ambulatory Visit: Payer: BLUE CROSS/BLUE SHIELD | Admitting: Family Medicine

## 2018-02-23 ENCOUNTER — Ambulatory Visit: Payer: BLUE CROSS/BLUE SHIELD | Admitting: Family Medicine

## 2018-03-06 ENCOUNTER — Ambulatory Visit: Payer: BLUE CROSS/BLUE SHIELD | Admitting: Family Medicine

## 2018-03-20 ENCOUNTER — Ambulatory Visit (INDEPENDENT_AMBULATORY_CARE_PROVIDER_SITE_OTHER): Payer: BLUE CROSS/BLUE SHIELD | Admitting: Family Medicine

## 2018-03-20 ENCOUNTER — Encounter: Payer: Self-pay | Admitting: Family Medicine

## 2018-03-20 VITALS — BP 116/68 | HR 68 | Temp 98.3°F | Ht 73.0 in | Wt 148.6 lb

## 2018-03-20 DIAGNOSIS — G894 Chronic pain syndrome: Secondary | ICD-10-CM | POA: Diagnosis not present

## 2018-03-20 DIAGNOSIS — F1190 Opioid use, unspecified, uncomplicated: Secondary | ICD-10-CM | POA: Diagnosis not present

## 2018-03-20 DIAGNOSIS — D571 Sickle-cell disease without crisis: Secondary | ICD-10-CM | POA: Diagnosis not present

## 2018-03-20 DIAGNOSIS — Z09 Encounter for follow-up examination after completed treatment for conditions other than malignant neoplasm: Secondary | ICD-10-CM

## 2018-03-20 LAB — POCT URINALYSIS DIP (MANUAL ENTRY)
Bilirubin, UA: NEGATIVE
Blood, UA: NEGATIVE
Glucose, UA: NEGATIVE mg/dL
Ketones, POC UA: NEGATIVE mg/dL
Nitrite, UA: NEGATIVE
Protein Ur, POC: NEGATIVE mg/dL
Spec Grav, UA: 1.015 (ref 1.010–1.025)
Urobilinogen, UA: 0.2 E.U./dL
pH, UA: 5.5 (ref 5.0–8.0)

## 2018-03-20 MED ORDER — IBUPROFEN 600 MG PO TABS: 600.0000 mg | ORAL_TABLET | Freq: Three times a day (TID) | ORAL | 2 refills | Status: DC | PRN

## 2018-03-20 MED ORDER — HYDROXYUREA 500 MG PO CAPS: 1500.0000 mg | ORAL_CAPSULE | Freq: Every day | ORAL | 3 refills | Status: DC

## 2018-03-20 MED ORDER — OXYCODONE HCL 10 MG PO TABS: 10.0000 mg | ORAL_TABLET | ORAL | 0 refills | Status: DC | PRN

## 2018-03-20 MED ORDER — OXYCONTIN 10 MG PO T12A: EXTENDED_RELEASE_TABLET | ORAL | 0 refills | Status: DC

## 2018-03-20 NOTE — Progress Notes (Signed)
Sickle Cell Follow Up   Subjective:    Patient ID: Matthew Case, male    DOB: August 01, 1995, 22 y.o.   MRN: [id number]   Chief Complaint  Patient presents with  . Follow-up    3 month on sickle cell    HPI  [name] is a 22 year old male with a past Sickle Cell Anemia.   Current Status: He is a Consulting civil engineer at [employer] and is originally from [facility] [place], [place]. He continues to see his primary physician in [place] when he goes home for school breaks.  Since his last office visit, he is doing well with no complaints. He states that he has pain in his lower back, kneed, joint, arms and legs. He rates 0/10 pain today at 5/10.  He has not has a hospital visit for Sickle Cell Crisis since 08/10/2015 where he was treated and discharged the same day. He is currently taking all medications as prescribed and staying well hydrated. He reports occasional dizziness and headaches.   He denies fevers, chills, fatigue, recent infections, weight loss, and night sweats. He has not had any headaches, visual changes, dizziness, and falls. No chest pain, heart palpitations, cough and shortness of breath reported. No reports of GI problems such as nausea, vomiting, diarrhea, and constipation. He has no reports of blood in stools, dysuria and hematuria. No depression or anxiety reported. He denies pain today.   Review of Systems  Constitutional: Negative.   HENT: Negative.   Eyes: Negative.   Respiratory: Negative.   Cardiovascular: Negative.   Gastrointestinal: Negative.   Genitourinary: Negative.   Musculoskeletal: Negative.   Skin: Negative.   Neurological: Negative.   Hematological: Negative.   Psychiatric/Behavioral: Negative.    Objective:   Physical Exam  Constitutional: He is oriented to person, place, and time. He appears well-developed and well-nourished.  HENT:  Head: Normocephalic and atraumatic.  Right Ear: External ear normal.  Left Ear: External ear normal.  Nose: Nose normal.  Mouth/Throat:  Oropharynx is clear and moist.  Eyes: Pupils are equal, round, and reactive to light. Conjunctivae and EOM are normal.  Neck: Normal range of motion. Neck supple.  Cardiovascular: Normal rate, regular rhythm, normal heart sounds and intact distal pulses.  Pulmonary/Chest: Effort normal and breath sounds normal.  Abdominal: Soft. Bowel sounds are normal.  Musculoskeletal: Normal range of motion.  Neurological: He is alert and oriented to person, place, and time.  Skin: Skin is warm and dry. Capillary refill takes less than 2 seconds.  Psychiatric: He has a normal mood and affect. His behavior is normal. Judgment and thought content normal.  Nursing note and vitals reviewed.  Assessment & Plan:   1. Hb-SS disease without crisis (HCC)  2. Sickle cell disease, type SS (HCC) He is doing well today. He will continue to take pain medications as prescribed; will continue to avoid extreme heat and cold; will continue to eat a healthy diet and drink at least 64 ounces of water daily; continue stool softener as needed; will avoid colds and flu; will continue to get plenty of sleep and rest; will continue to avoid high stressful situations and remain infection free; will continue Folic Acid 1 mg daily to avoid sickle cell crisis.   - POCT urinalysis dipstick - hydroxyurea (HYDREA) 500 MG capsule; Take 3 capsules (1,500 mg total) by mouth daily.  Dispense: 270 capsule; Refill: 3 - ibuprofen (ADVIL,MOTRIN) 600 MG tablet; Take 1 tablet (600 mg total) by mouth every 8 (eight) hours as  needed.  Dispense: 30 tablet; Refill: 2 - Oxycodone HCl 10 MG TABS; Take 1 tablet (10 mg total) by mouth every 4 (four) hours as needed.  Dispense: 60 tablet; Refill: 0 - OXYCONTIN 10 MG 12 hr tablet; TAKE 1 TABLET EVERY 12 HOURS AS NEEDED FOR PERSISTENT PAIN.  Dispense: 60 tablet; Refill: 0  3. Chronic pain syndrome Stable. Continue pain medications as prescribed.   4. Chronic, continuous use of opioids We will refill  Oxycodone ER 10 mg and Oxy-IR 10 mg today.   5. Follow up He will follow up in 2 months.   Matthew IpNatalie Karenann Mcgrory,  MSN, FNP-C Patient Care [facility] [address]  [place], [place] [address] [phone]

## 2018-05-22 ENCOUNTER — Ambulatory Visit: Payer: BLUE CROSS/BLUE SHIELD | Admitting: Family Medicine

## 2018-06-08 ENCOUNTER — Encounter: Payer: Self-pay | Admitting: Family Medicine

## 2018-06-08 ENCOUNTER — Ambulatory Visit (INDEPENDENT_AMBULATORY_CARE_PROVIDER_SITE_OTHER): Payer: BLUE CROSS/BLUE SHIELD | Admitting: Family Medicine

## 2018-06-08 VITALS — BP 124/65 | HR 74 | Temp 97.7°F | Resp 16 | Ht 73.0 in | Wt 147.0 lb

## 2018-06-08 DIAGNOSIS — D571 Sickle-cell disease without crisis: Secondary | ICD-10-CM | POA: Diagnosis not present

## 2018-06-08 LAB — POCT URINALYSIS DIPSTICK
Bilirubin, UA: NEGATIVE
Blood, UA: NEGATIVE
Glucose, UA: NEGATIVE
Ketones, UA: NEGATIVE
Nitrite, UA: NEGATIVE
Protein, UA: NEGATIVE
Spec Grav, UA: 1.015 (ref 1.010–1.025)
Urobilinogen, UA: 0.2 E.U./dL
pH, UA: 5.5 (ref 5.0–8.0)

## 2018-06-08 MED ORDER — IBUPROFEN 600 MG PO TABS: 600.0000 mg | ORAL_TABLET | Freq: Three times a day (TID) | ORAL | 2 refills | Status: DC | PRN

## 2018-06-08 MED ORDER — OXYCONTIN 10 MG PO T12A: EXTENDED_RELEASE_TABLET | ORAL | 0 refills | Status: DC

## 2018-06-08 MED ORDER — OXYCODONE HCL 10 MG PO TABS: 10.0000 mg | ORAL_TABLET | ORAL | 0 refills | Status: DC | PRN

## 2018-06-08 MED ORDER — HYDROXYUREA 500 MG PO CAPS: 1500.0000 mg | ORAL_CAPSULE | Freq: Every day | ORAL | 3 refills | Status: DC

## 2018-06-08 NOTE — Progress Notes (Signed)
PATIENT CARE CENTER INTERNAL MEDICINE AND SICKLE CELL CARE  SICKLE CELL ANEMIA FOLLOW UP VISIT PROVIDER: Mike Gip, FNP    Subjective:   Matthew Case  is a 22 y.o.  male who  has a past medical history of Sickle cell disease (HCC). presents for a follow up for Sickle Cell Anemia. The patient has had 0 admissions in the past 6 months.  Pain regimen includes: Ibuprofen, oxycontin 10mg  and oxycodone 10 mg.  Hydrea Therapy: Yes Medication compliance: Yes  Pain today is 0/10.  The patient reports adequate daily hydration.   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   Review of Systems  Constitutional: Negative.   HENT: Negative.   Eyes: Negative.   Respiratory: Negative.   Cardiovascular: Negative.   Gastrointestinal: Negative.   Genitourinary: Negative.   Musculoskeletal: Negative.   Skin: Negative.   Neurological: Negative.   Psychiatric/Behavioral: Negative.     Objective:   Objective  BP 124/65 (BP Location: Left Arm, Patient Position: Sitting, Cuff Size: Normal)   Pulse 74   Temp 97.7 F (36.5 C) (Oral)   Resp 16   Ht 6\' 1"  (1.854 m)   Wt 147 lb (66.7 kg)   SpO2 100%   BMI 19.39 kg/m   Wt Readings from Last 3 Encounters:  06/08/18 147 lb (66.7 kg)  03/20/18 148 lb 9.6 oz (67.4 kg)  11/13/17 141 lb (64 kg)     Physical Exam  Constitutional: He is oriented to person, place, and time. He appears well-developed and well-nourished. No distress.  HENT:  Head: Normocephalic and atraumatic.  Eyes: Pupils are equal, round, and reactive to light. Conjunctivae and EOM are normal.  Neck: Normal range of motion.  Cardiovascular: Normal rate, regular rhythm, normal heart sounds and intact distal pulses.  Pulmonary/Chest: Effort normal and breath sounds normal. No respiratory distress.  Abdominal: Soft. Bowel sounds are normal. He exhibits no distension.  Musculoskeletal: Normal range of motion.  Neurological: He is alert and oriented to  person, place, and time.  Skin: Skin is warm and dry.  Psychiatric: He has a normal mood and affect. His behavior is normal. Thought content normal.  Nursing note and vitals reviewed.    Assessment/Plan:   Assessment   Encounter Diagnosis  Name Primary?  . Sickle cell disease, type SS (HCC) Yes     Plan  1. Sickle cell disease, type SS (HCC) - Urinalysis Dipstick - hydroxyurea (HYDREA) 500 MG capsule; Take 3 capsules (1,500 mg total) by mouth daily.  Dispense: 270 capsule; Refill: 3 - ibuprofen (ADVIL,MOTRIN) 600 MG tablet; Take 1 tablet (600 mg total) by mouth every 8 (eight) hours as needed.  Dispense: 30 tablet; Refill: 2   Return to care as scheduled and prn. Patient verbalized understanding and agreed with plan of care.   1. Sickle cell disease - Continue Hydrea   We discussed the need for good hydration, monitoring of hydration status, avoidance of heat, cold, stress, and infection triggers. We discussed the risks and benefits of Hydrea, including bone marrow suppression, the possibility of GI upset, skin ulcers, hair thinning, and teratogenicity. The patient was reminded of the need to seek medical attention of any symptoms of bleeding, anemia, or infection. Continue folic acid 1 mg daily to prevent aplastic bone marrow crises.   2. Pulmonary evaluation - Patient denies severe recurrent wheezes, shortness of breath with exercise, or persistent cough. If these symptoms develop, pulmonary function tests with spirometry will be ordered, and if  abnormal, plan on referral to Pulmonology for further evaluation.  3. Cardiac - Routine screening for pulmonary hypertension is not recommended.  4. Eye - High risk of proliferative retinopathy. Annual eye exam with retinal exam recommended to patient.  5. Immunization status -  Yearly influenza vaccination is recommended, as well as being up to date with Meningococcal and Pneumococcal vaccines.   6. Acute and chronic painful episodes -  We discussed that pt is to receive Schedule II prescriptions only from us. Pt is also aware that the prescription history is available to us online through the [facility] [place] CSRS. Controlled substance agreement signed. We reminded Erick ColaceMitchel Waller that all patients receiving Schedule II narcotics must be seen for follow within one month of prescription being requested. We reviewed the terms of our pain agreement, including the need to keep medicines in a safe locked location away from children or pets, and the need to report excess sedation or constipation, measures to avoid constipation, and policies related to early refills and stolen prescriptions. According to the [place] Chronic Pain Initiative program, we have reviewed details related to analgesia, adverse effects, aberrant behaviors.  7. Iron overload from chronic transfusion.  Not applicable at this time.  If this occurs will use Exjade for management.   8. Vitamin D deficiency - Drisdol 50,000 units weekly. Patient encouraged to take as prescribed.   The above recommendations are taken from the NIH Evidence-Based Management of Sickle Cell Disease: Expert Panel Report, [id number].   Ms. Andr L. Riley Lamouglas, FNP-BC Patient Care Center [facility] [address]  [place], [place] [address] [phone]  This note has been created with Dragon speech recognition software and smart phrase technology. Any transcriptional errors are unintentional.

## 2018-06-08 NOTE — Patient Instructions (Signed)
Sickle Cell Anemia, Adult °Sickle cell anemia is a condition where your red blood cells are shaped like sickles. Red blood cells carry oxygen through the body. Sickle-shaped red blood cells do not live as long as normal red blood cells. They also clump together and block blood from flowing through the blood vessels. These things prevent the body from getting enough oxygen. Sickle cell anemia causes organ damage and pain. It also increases the risk of infection. °Follow these instructions at home: °· Drink enough fluid to keep your pee (urine) clear or pale yellow. Drink more in hot weather and during exercise. °· Do not smoke. Smoking lowers oxygen levels in the blood. °· Only take over-the-counter or prescription medicines as told by your doctor. °· Take antibiotic medicines as told by your doctor. Make sure you finish them even if you start to feel better. °· Take supplements as told by your doctor. °· Consider wearing a medical alert bracelet. This tells anyone caring for you in an emergency of your condition. °· When traveling, keep your medical information, doctors' names, and the medicines you take with you at all times. °· If you have a fever, do not take fever medicines right away. This could cover up a problem. Tell your doctor. °· Keep all follow-up visits with your doctor. Sickle cell anemia requires regular medical care. °Contact a doctor if: °You have a fever. °Get help right away if: °· You feel dizzy or faint. °· You have new belly (abdominal) pain, especially on the left side near the stomach area. °· You have a lasting, often uncomfortable and painful erection of the penis (priapism). If it is not treated right away, you will become unable to have sex (impotence). °· You have numbness in your arms or legs or you have a hard time moving them. °· You have a hard time talking. °· You have a fever or lasting symptoms for more than 2-3 days. °· You have a fever and your symptoms suddenly get  worse. °· You have signs or symptoms of infection. These include: °? Chills. °? Being more tired than normal (lethargy). °? Irritability. °? Poor eating. °? Throwing up (vomiting). °· You have pain that is not helped with medicine. °· You have shortness of breath. °· You have pain in your chest. °· You are coughing up pus-like or bloody mucus. °· You have a stiff neck. °· Your feet or hands swell or have pain. °· Your belly looks bloated. °· Your joints hurt. °This information is not intended to replace advice given to you by your health care provider. Make sure you discuss any questions you have with your health care provider. °Document Released: 04/10/2013 Document Revised: 11/26/2015 Document Reviewed: 01/30/2013 °Elsevier Interactive Patient Education © 2017 Elsevier Inc. ° °

## 2018-06-09 LAB — CBC WITH DIFFERENTIAL
Basophils Absolute: 0.1 10*3/uL (ref 0.0–0.2)
Basos: 1 %
EOS (ABSOLUTE): 0.3 10*3/uL (ref 0.0–0.4)
Eos: 4 %
Hematocrit: 22 % — ABNORMAL LOW (ref 37.5–51.0)
Hemoglobin: 7.8 g/dL — ABNORMAL LOW (ref 13.0–17.7)
Immature Grans (Abs): 0 10*3/uL (ref 0.0–0.1)
Immature Granulocytes: 0 %
Lymphocytes Absolute: 3.5 10*3/uL — ABNORMAL HIGH (ref 0.7–3.1)
Lymphs: 43 %
MCH: 39.6 pg — ABNORMAL HIGH (ref 26.6–33.0)
MCHC: 35.5 g/dL (ref 31.5–35.7)
MCV: 112 fL — ABNORMAL HIGH (ref 79–97)
Monocytes Absolute: 0.9 10*3/uL (ref 0.1–0.9)
Monocytes: 12 %
NRBC: 13 % — ABNORMAL HIGH (ref 0–0)
Neutrophils Absolute: 3.2 10*3/uL (ref 1.4–7.0)
Neutrophils: 40 %
RBC: 1.97 x10E6/uL — CL (ref 4.14–5.80)
RDW: 17.6 % — ABNORMAL HIGH (ref 12.3–15.4)
WBC: 8.1 10*3/uL (ref 3.4–10.8)

## 2018-06-09 LAB — COMPREHENSIVE METABOLIC PANEL
ALT: 24 IU/L (ref 0–44)
AST: 31 IU/L (ref 0–40)
Albumin/Globulin Ratio: 2 (ref 1.2–2.2)
Albumin: 4.8 g/dL (ref 3.5–5.5)
Alkaline Phosphatase: 74 IU/L (ref 39–117)
BUN/Creatinine Ratio: 13 (ref 9–20)
BUN: 9 mg/dL (ref 6–20)
Bilirubin Total: 1.7 mg/dL — ABNORMAL HIGH (ref 0.0–1.2)
CO2: 23 mmol/L (ref 20–29)
Calcium: 9.4 mg/dL (ref 8.7–10.2)
Chloride: 104 mmol/L (ref 96–106)
Creatinine, Ser: 0.71 mg/dL — ABNORMAL LOW (ref 0.76–1.27)
GFR calc Af Amer: 153 mL/min/{1.73_m2} (ref >59)
GFR calc non Af Amer: 132 mL/min/{1.73_m2} (ref >59)
Globulin, Total: 2.4 g/dL (ref 1.5–4.5)
Glucose: 96 mg/dL (ref 65–99)
Potassium: 4.6 mmol/L (ref 3.5–5.2)
Sodium: 140 mmol/L (ref 134–144)
Total Protein: 7.2 g/dL (ref 6.0–8.5)

## 2018-06-09 LAB — MED LIST OPTION NOT SELECTED

## 2018-06-09 LAB — VITAMIN D 25 HYDROXY (VIT D DEFICIENCY, FRACTURES): Vit D, 25-Hydroxy: 7.9 ng/mL — ABNORMAL LOW (ref 30.0–100.0)

## 2018-06-13 LAB — 737588 9+OXYCODONE+CRT-UNBUND
Amphetamine Scrn, Ur: NEGATIVE ng/mL
BARBITURATE SCREEN URINE: NEGATIVE ng/mL
BENZODIAZEPINE SCREEN, URINE: NEGATIVE ng/mL
Cocaine (Metab) Scrn, Ur: NEGATIVE ng/mL
Creatinine(Crt), U: 91.6 mg/dL (ref 20.0–300.0)
Methadone Screen, Urine: NEGATIVE ng/mL
Opiate Scrn, Ur: NEGATIVE ng/mL
Ph of Urine: 5.6 (ref 4.5–8.9)
Phencyclidine Qn, Ur: NEGATIVE ng/mL
Propoxyphene Scrn, Ur: NEGATIVE ng/mL

## 2018-06-13 LAB — CANNABINOID (GC/MS), URINE
Cannabinoid: POSITIVE — AB
Carboxy THC (GC/MS): >400 ng/mL

## 2018-06-13 LAB — OXYCODONE/OXYMORPHONE CONFIRM
OXYCODONE/OXYMORPH: POSITIVE — AB
OXYCODONE: 1078 ng/mL
OXYCODONE: POSITIVE — AB
OXYMORPHONE CONFIRM: 1636 ng/mL
OXYMORPHONE: POSITIVE — AB

## 2018-08-07 ENCOUNTER — Encounter: Payer: Self-pay | Admitting: Family Medicine

## 2018-08-07 ENCOUNTER — Ambulatory Visit (INDEPENDENT_AMBULATORY_CARE_PROVIDER_SITE_OTHER): Payer: BLUE CROSS/BLUE SHIELD | Admitting: Family Medicine

## 2018-08-07 VITALS — BP 114/68 | HR 78 | Temp 98.0°F | Ht 73.0 in | Wt 151.6 lb

## 2018-08-07 DIAGNOSIS — G894 Chronic pain syndrome: Secondary | ICD-10-CM

## 2018-08-07 DIAGNOSIS — Z09 Encounter for follow-up examination after completed treatment for conditions other than malignant neoplasm: Secondary | ICD-10-CM

## 2018-08-07 DIAGNOSIS — F1190 Opioid use, unspecified, uncomplicated: Secondary | ICD-10-CM

## 2018-08-07 DIAGNOSIS — D571 Sickle-cell disease without crisis: Secondary | ICD-10-CM

## 2018-08-07 LAB — POCT URINALYSIS DIP (MANUAL ENTRY)
Bilirubin, UA: NEGATIVE
Glucose, UA: NEGATIVE mg/dL
Ketones, POC UA: NEGATIVE mg/dL
Leukocytes, UA: NEGATIVE
Nitrite, UA: NEGATIVE
Protein Ur, POC: NEGATIVE mg/dL
Spec Grav, UA: 1.02 (ref 1.010–1.025)
Urobilinogen, UA: 0.2 E.U./dL
pH, UA: 5.5 (ref 5.0–8.0)

## 2018-08-07 NOTE — Progress Notes (Signed)
Patient Care Center Internal Medicine and Sickle Cell Care  Sickle Cell Anemia--Established Patient   Subjective:  Patient ID: Matthew Case, male    DOB: 1996/01/31  Age: 23 y.o. MRN: [id number]  CC:  Chief Complaint  Patient presents with  . Follow-up    sickle cell    HPI Matthew Case is a 23 year old male who presents for Sickle Cell Anemia follow up.   Past Medical History:  Diagnosis Date  . Sickle cell disease (HCC)    Current Status: Since his last office visit, he is doing well with no complaints. He states that usually when he has pain he usually has it in his back and legs. He rates his pain today at 0/10.  He has not has a hospital visit for Sickle Cell Crisis since 08/10/2015 where he was treated and discharged the same day. He is currently taking all medications as prescribed and staying well hydrated. He reports occasional dizziness and headaches.   She denies fevers, chills, fatigue, recent infections, weight loss, and night sweats. She has not had any visual changes, and falls. No chest pain, heart palpitations, cough and shortness of breath reported. No reports of GI problems such as nausea, vomiting, diarrhea, and constipation. She has no reports of blood in stools, dysuria and hematuria. No depression or anxiety reported.   Past Surgical History:  Procedure Laterality Date  . CHOLECYSTECTOMY N/A 07/27/2013   Procedure: LAPAROSCOPIC CHOLECYSTECTOMY ;  Surgeon: Shelly Rubenstein, MD;  Location: [facility] OR;  Service: General;  Laterality: N/A;  . WISDOM TOOTH EXTRACTION      Family History  Problem Relation Age of Onset  . Hypertension Mother   . Diabetes Maternal Grandmother   . Hypertension Maternal Grandmother   . Hypertension Maternal Grandfather   . Diabetes Maternal Grandfather     Social History   Socioeconomic History  . Marital status: Single    Spouse name: Not on file  . Number of children: Not on file  . Years of education: Not on file  .  Highest education level: Not on file  Occupational History  . Not on file  Social Needs  . Financial resource strain: Not on file  . Food insecurity:    Worry: Not on file    Inability: Not on file  . Transportation needs:    Medical: Not on file    Non-medical: Not on file  Tobacco Use  . Smoking status: Never Smoker  . Smokeless tobacco: Never Used  Substance and Sexual Activity  . Alcohol use: No  . Drug use: Yes    Types: Marijuana  . Sexual activity: Yes    Birth control/protection: Condom  Lifestyle  . Physical activity:    Days per week: Not on file    Minutes per session: Not on file  . Stress: Not on file  Relationships  . Social connections:    Talks on phone: Not on file    Gets together: Not on file    Attends religious service: Not on file    Active member of club or organization: Not on file    Attends meetings of clubs or organizations: Not on file    Relationship status: Not on file  . Intimate partner violence:    Fear of current or ex partner: Not on file    Emotionally abused: Not on file    Physically abused: Not on file    Forced sexual activity: Not on file  Other Topics  Concern  . Not on file  Social History Narrative  . Not on file    Outpatient Medications Prior to Visit  Medication Sig Dispense Refill  . hydroxyurea (HYDREA) 500 MG capsule Take 3 capsules (1,500 mg total) by mouth daily. 270 capsule 3  . ibuprofen (ADVIL,MOTRIN) 600 MG tablet Take 1 tablet (600 mg total) by mouth every 8 (eight) hours as needed. 30 tablet 2  . naloxone (NARCAN) nasal spray 4 mg/0.1 mL Place 4 mg into the nose once.    . Oxycodone HCl 10 MG TABS Take 1 tablet (10 mg total) by mouth every 4 (four) hours as needed. 60 tablet 0  . OXYCONTIN 10 MG 12 hr tablet TAKE 1 TABLET EVERY 12 HOURS AS NEEDED FOR PERSISTENT PAIN. 60 tablet 0  . aspirin EC 81 MG tablet Take 1 tablet (81 mg total) by mouth daily. (Patient not taking: Reported on 11/13/2017) 90 tablet 1  .  ergocalciferol (VITAMIN D2) 50000 units capsule Take 1 capsule (50,000 Units total) by mouth once a week. (Patient not taking: Reported on 03/20/2018) 30 capsule 5  . folic acid (FOLVITE) 1 MG tablet Take 1 tablet (1 mg total) by mouth daily. (Patient not taking: Reported on 03/20/2018) 90 tablet 3  . L-glutamine (ENDARI) 5 g PACK Powder Packet Take 10 g by mouth 2 (two) times daily. (Patient not taking: Reported on 08/08/2017) 120 packet 5   No facility-administered medications prior to visit.     No Known Allergies  ROS Review of Systems  Constitutional: Negative.   HENT: Negative.   Eyes: Negative.   Respiratory: Negative.   Cardiovascular: Negative.   Gastrointestinal: Negative.   Endocrine: Negative.   Genitourinary: Negative.   Musculoskeletal: Negative.   Skin: Negative.   Allergic/Immunologic: Negative.   Neurological: Positive for dizziness and headaches.  Hematological: Negative.   Psychiatric/Behavioral: Negative.       Objective:    Physical Exam  Constitutional: He is oriented to person, place, and time. He appears well-developed and well-nourished.  HENT:  Head: Normocephalic and atraumatic.  Eyes: Conjunctivae are normal.  Neck: Normal range of motion. Neck supple.  Cardiovascular: Normal rate, regular rhythm, normal heart sounds and intact distal pulses.  Pulmonary/Chest: Effort normal and breath sounds normal.  Abdominal: Soft. Bowel sounds are normal.  Musculoskeletal: Normal range of motion.  Neurological: He is alert and oriented to person, place, and time. He has normal reflexes.  Skin: Skin is warm and dry.  Psychiatric: He has a normal mood and affect. His behavior is normal. Judgment and thought content normal.  Nursing note and vitals reviewed.   BP 114/68 (BP Location: Left Arm, Patient Position: Sitting, Cuff Size: Small)   Pulse 78   Temp 98 F (36.7 C)   Ht 6\' 1"  (1.854 m)   Wt 151 lb 9.6 oz (68.8 kg)   SpO2 97%   BMI 20.00 kg/m  Wt  Readings from Last 3 Encounters:  08/07/18 151 lb 9.6 oz (68.8 kg)  06/08/18 147 lb (66.7 kg)  03/20/18 148 lb 9.6 oz (67.4 kg)     Health Maintenance Due  Topic Date Due  . HIV Screening  05/24/2010    There are no preventive care reminders to display for this patient.  Lab Results  Component Value Date   TSH 1.93 11/23/2016   Lab Results  Component Value Date   WBC 8.1 06/08/2018   HGB 7.8 (L) 06/08/2018   HCT 22.0 (L) 06/08/2018   MCV 112 (  H) 06/08/2018   PLT 513 (H) 11/23/2016   Lab Results  Component Value Date   NA 140 06/08/2018   K 4.6 06/08/2018   CO2 23 06/08/2018   GLUCOSE 96 06/08/2018   BUN 9 06/08/2018   CREATININE 0.71 (L) 06/08/2018   BILITOT 1.7 (H) 06/08/2018   ALKPHOS 74 06/08/2018   AST 31 06/08/2018   ALT 24 06/08/2018   PROT 7.2 06/08/2018   ALBUMIN 4.8 06/08/2018   CALCIUM 9.4 06/08/2018   ANIONGAP 8 08/10/2015   No results found for: CHOL No results found for: HDL No results found for: LDLCALC No results found for: TRIG No results found for: CHOLHDL No results found for: [id number]   Assessment & Plan:   1. Hb-SS disease without crisis (HCC)  2. Sickle cell disease, type SS (HCC) He is doing well today. He will continue to take pain medications as prescribed; will continue to avoid extreme heat and cold; will continue to eat a healthy diet and drink at least 64 ounces of water daily; continue stool softener as needed; will avoid colds and flu; will continue to get plenty of sleep and rest; will continue to avoid high stressful situations and remain infection free; will continue Folic Acid 1 mg daily to avoid sickle cell crisis.   3. Chronic pain syndrome  4. Chronic, continuous use of opioids  5. Follow up He will follow up in 2 months.  - POCT urinalysis dipstick  No orders of the defined types were placed in this encounter.  Raliegh IpNatalie Jaquasha Carnevale,  MSN, FNP-C Patient Care Center [facility] [address]    [place], [place] [address] [phone]    Problem List Items Addressed This Visit      Other   Sickle cell disease, type SS (HCC)    Other Visit Diagnoses    Hb-SS disease without crisis (HCC)    -  Primary   Chronic pain syndrome       Chronic, continuous use of opioids       Follow up       Relevant Orders   POCT urinalysis dipstick (Completed)      No orders of the defined types were placed in this encounter.   Follow-up: Return in about 2 months (around 10/06/2018).    Kallie LocksNatalie M Ahmiyah Coil, FNP

## 2018-08-10 ENCOUNTER — Ambulatory Visit: Payer: BLUE CROSS/BLUE SHIELD | Admitting: Family Medicine

## 2018-08-14 ENCOUNTER — Telehealth: Payer: Self-pay

## 2018-08-17 ENCOUNTER — Other Ambulatory Visit: Payer: Self-pay | Admitting: Family Medicine

## 2018-08-17 DIAGNOSIS — D571 Sickle-cell disease without crisis: Secondary | ICD-10-CM

## 2018-08-17 MED ORDER — OXYCONTIN 10 MG PO T12A: EXTENDED_RELEASE_TABLET | ORAL | 0 refills | Status: DC

## 2018-08-17 MED ORDER — IBUPROFEN 600 MG PO TABS: 600.0000 mg | ORAL_TABLET | Freq: Three times a day (TID) | ORAL | 3 refills | Status: DC | PRN

## 2018-08-17 MED ORDER — OXYCODONE HCL 10 MG PO TABS: 10.0000 mg | ORAL_TABLET | ORAL | 0 refills | Status: DC | PRN

## 2018-08-17 MED ORDER — HYDROXYUREA 500 MG PO CAPS: 1500.0000 mg | ORAL_CAPSULE | Freq: Every day | ORAL | 3 refills | Status: DC

## 2018-10-09 ENCOUNTER — Ambulatory Visit: Payer: BLUE CROSS/BLUE SHIELD | Admitting: Family Medicine

## 2018-10-29 ENCOUNTER — Other Ambulatory Visit: Payer: Self-pay

## 2018-10-29 ENCOUNTER — Other Ambulatory Visit: Payer: Self-pay | Admitting: Family Medicine

## 2018-10-29 DIAGNOSIS — D571 Sickle-cell disease without crisis: Secondary | ICD-10-CM

## 2018-10-29 MED ORDER — OXYCODONE HCL 10 MG PO TABS: 10.0000 mg | ORAL_TABLET | ORAL | 0 refills | Status: DC | PRN

## 2018-10-29 MED ORDER — HYDROXYUREA 500 MG PO CAPS: 1500.0000 mg | ORAL_CAPSULE | Freq: Every day | ORAL | 3 refills | Status: AC

## 2018-10-29 MED ORDER — IBUPROFEN 600 MG PO TABS: 600.0000 mg | ORAL_TABLET | Freq: Three times a day (TID) | ORAL | 3 refills | Status: AC | PRN

## 2018-10-29 MED ORDER — OXYCONTIN 10 MG PO T12A: EXTENDED_RELEASE_TABLET | ORAL | 0 refills | Status: AC

## 2018-10-30 NOTE — Telephone Encounter (Signed)
Message sent to provider 

## 2018-11-01 NOTE — Telephone Encounter (Signed)
Message sent to provider 

## 2018-11-02 NOTE — Telephone Encounter (Signed)
Message sent to provider 

## 2018-11-08 NOTE — Telephone Encounter (Signed)
Message sent to provider 

## 2018-12-28 ENCOUNTER — Ambulatory Visit: Payer: BLUE CROSS/BLUE SHIELD | Admitting: Family Medicine

## 2019-01-23 ENCOUNTER — Other Ambulatory Visit: Payer: Self-pay | Admitting: Family Medicine

## 2019-01-23 ENCOUNTER — Other Ambulatory Visit: Payer: Self-pay

## 2019-01-23 DIAGNOSIS — D571 Sickle-cell disease without crisis: Secondary | ICD-10-CM

## 2019-01-23 MED ORDER — OXYCODONE HCL 10 MG PO TABS: 10.0000 mg | ORAL_TABLET | ORAL | 0 refills | Status: AC | PRN

## 2019-01-23 NOTE — Telephone Encounter (Signed)
Refill request for oxycodone and OxyContin. Please advise.

## 2019-02-08 ENCOUNTER — Ambulatory Visit: Payer: BLUE CROSS/BLUE SHIELD | Admitting: Family Medicine

## 2019-09-03 ENCOUNTER — Encounter
Admit: 2019-09-03 | Payer: PRIVATE HEALTH INSURANCE | Attending: Hematology & Oncology | Primary: Student in an Organized Health Care Education/Training Program

## 2019-09-10 ENCOUNTER — Telehealth
Admit: 2019-09-10 | Payer: PRIVATE HEALTH INSURANCE | Primary: Student in an Organized Health Care Education/Training Program

## 2019-09-10 NOTE — Telephone Encounter
Attempted to call patient for pre-visit COVID screening. Unable to reach patient. Left message instructing patient to call clinic at [phone] prior to appointment if they have:FeverChillsCoughShortness of breathRunny nose or congestionSore throatVomitingDiarrheaHeadacheSudden loss of taste or smellTravel outside the state of [place] in the last 10 daysA positive test for COVID or exposure to someone who tested positive for COVID in the last 14 days

## 2019-09-11 ENCOUNTER — Ambulatory Visit
Admit: 2019-09-11 | Payer: BLUE CROSS/BLUE SHIELD | Attending: Hematology & Oncology | Primary: Student in an Organized Health Care Education/Training Program

## 2019-09-11 ENCOUNTER — Encounter
Admit: 2019-09-11 | Payer: PRIVATE HEALTH INSURANCE | Attending: Hematology & Oncology | Primary: Student in an Organized Health Care Education/Training Program

## 2019-09-11 ENCOUNTER — Inpatient Hospital Stay
Admit: 2019-09-11 | Discharge: 2019-09-11 | Payer: BLUE CROSS/BLUE SHIELD | Primary: Student in an Organized Health Care Education/Training Program

## 2019-09-11 DIAGNOSIS — D7389 Other diseases of spleen: Secondary | ICD-10-CM

## 2019-09-11 DIAGNOSIS — Z23 Encounter for immunization: Secondary | ICD-10-CM

## 2019-09-11 DIAGNOSIS — D571 Sickle-cell disease without crisis: Secondary | ICD-10-CM

## 2019-09-11 DIAGNOSIS — M87052 Idiopathic aseptic necrosis of left femur: Secondary | ICD-10-CM

## 2019-09-11 DIAGNOSIS — D5700 Hb-SS disease with crisis, unspecified: Secondary | ICD-10-CM

## 2019-09-11 DIAGNOSIS — H902 Conductive hearing loss, unspecified: Secondary | ICD-10-CM

## 2019-09-11 DIAGNOSIS — IMO0002 Dactylitis: Secondary | ICD-10-CM

## 2019-09-11 DIAGNOSIS — Z7964 On hydroxyurea therapy: Secondary | ICD-10-CM

## 2019-09-11 DIAGNOSIS — G8929 Other chronic pain: Secondary | ICD-10-CM

## 2019-09-11 DIAGNOSIS — K219 Gastro-esophageal reflux disease without esophagitis: Secondary | ICD-10-CM

## 2019-09-11 DIAGNOSIS — D6189 Other specified aplastic anemias and other bone marrow failure syndromes: Secondary | ICD-10-CM

## 2019-09-11 LAB — ZZZ9 DRUG TOXICOLOGY PANEL, URINE, W/ CONF.     (YH)
BKR AMPHETAMINE SCREEN, URINE, W/ CONF.: NEGATIVE
BKR BARBITURATE SCREEN, URINE, NO CONF.: NEGATIVE
BKR BENZODIAZEPINES SCREEN, URINE, NO CONF.: NEGATIVE
BKR CANNABINOIDS SCREEN, URINE, NO CONF.: POSITIVE — AB
BKR COCAINE SCREEN, URINE, W/ CONF.: NEGATIVE
BKR METHADONE METABOLITE SCREEN, URINE, W/ CONF.: NEGATIVE
BKR OPIATES SCREEN, URINE, W/ CONF.: NEGATIVE
BKR OXYCODONE SCREEN, URINE, W/ CONF.: POSITIVE — AB
BKR PHENCYCLIDINE (PCP) SCREEN, URINE, W/ CONF.: NEGATIVE

## 2019-09-11 LAB — CBC WITHOUT DIFFERENTIAL
BKR AST/ALT RATIO: 2.6 x 1000/??L (ref 0.00–1.00)
BKR BUN / CREAT RATIO: 6.3 % — ABNORMAL HIGH (ref 8.0–23.0)
BKR WAM ANALYZER ANC: 2.6 x 1000/ÂµL (ref 1.0–11.0)
BKR WAM HEMATOCRIT: 23.5 % — ABNORMAL LOW (ref 37.0–52.0)
BKR WAM HEMOGLOBIN: 8.4 g/dL — ABNORMAL LOW (ref 12.0–18.0)
BKR WAM MCH (PG): 38.5 pg — ABNORMAL HIGH (ref 27.0–31.0)
BKR WAM MCHC: 35.7 g/dL (ref 31.0–36.0)
BKR WAM MCV: 107.8 fL — ABNORMAL HIGH (ref 78.0–94.0)
BKR WAM MPV: 8.8 fL (ref 6.0–11.0)
BKR WAM PLATELETS: 447 x1000/ÂµL — ABNORMAL HIGH (ref 140–440)
BKR WAM RDW-CV: 15.6 % — ABNORMAL HIGH (ref 11.5–14.5)
BKR WAM RED BLOOD CELL COUNT: 2.2 M/ÂµL — ABNORMAL LOW (ref 3.8–5.9)
BKR WAM WHITE BLOOD CELL COUNT: 6.3 x1000/ÂµL (ref 4.0–10.0)

## 2019-09-11 LAB — BASIC METABOLIC PANEL
BKR ANION GAP: 7 (ref 7–17)
BKR BLOOD UREA NITROGEN: 9 mg/dL (ref 6–20)
BKR BUN / CREAT RATIO: 11.4 (ref 8.0–23.0)
BKR CALCIUM: 9.2 mg/dL (ref 8.8–10.2)
BKR CHLORIDE: 107 mmol/L (ref 98–107)
BKR CO2: 25 mmol/L (ref 20–30)
BKR CREATININE: 0.79 mg/dL (ref 0.40–1.30)
BKR EGFR (AFR AMER): >60 mL/min/{1.73_m2} (ref >60)
BKR EGFR (NON AFRICAN AMERICAN): >60 mL/min/{1.73_m2} (ref >60)
BKR GLUCOSE: 94 mg/dL (ref 70–100)
BKR POTASSIUM: 4.5 mmol/L (ref 3.3–5.1)
BKR SODIUM: 139 mmol/L (ref 136–144)

## 2019-09-11 LAB — HEPATIC FUNCTION PANEL
BKR A/G RATIO: 2 (ref 1.0–2.2)
BKR ALANINE AMINOTRANSFERASE (ALT): 14 U/L (ref 9–59)
BKR ALBUMIN: 4.8 g/dL (ref 3.6–4.9)
BKR ALKALINE PHOSPHATASE: 84 U/L (ref 9–122)
BKR ASPARTATE AMINOTRANSFERASE (AST): 27 U/L (ref 10–35)
BKR AST/ALT RATIO: 1.9 (ref 0.3–4.9)
BKR BILIRUBIN DIRECT: 0.3 mg/dL (ref <=0.3)
BKR BILIRUBIN TOTAL: 1.7 mg/dL — ABNORMAL HIGH (ref <=1.2)
BKR GLOBULIN: 2.4 g/dL (ref 2.3–3.5)
BKR PROTEIN TOTAL: 7.2 g/dL (ref 6.6–8.7)

## 2019-09-11 LAB — RETICULOCYTES
BKR WAM IRF: 25.7 % — ABNORMAL HIGH (ref 3.0–15.9)
BKR WAM RETICULOCYTE - ABS: 0.1727 10Ë6 cells/uL — ABNORMAL HIGH (ref 0.0230–0.1400)
BKR WAM RETICULOCYTE COUNT PCT: 7.92 % — ABNORMAL HIGH (ref 0.60–2.70)
BKR WAM RETICULOCYTE HGB EQUIVALENT: 37.7 pg

## 2019-09-11 LAB — PROTEIN, TOTAL W/CREATININE, URINE, RANDOM     (BH GH LMW YH)
BKR CREATININE, URINE, RANDOM: 156 mg/dL
BKR PROTEIN URINE RANDOM: 0.08 g/L
BKR PROTEIN/CREATININE RATIO, URINE, RANDOM: 0.05 mg/mg{creat} (ref <0.10)

## 2019-09-11 MED ORDER — OXYCODONE ER 10 MG TABLET,CRUSH RESISTANT,EXTENDED RELEASE 12 HR
10 mg | ORAL_TABLET | 1 refills | Status: AC
Start: 2019-09-11 — End: 2019-10-25

## 2019-09-11 MED ORDER — IBUPROFEN 600 MG TABLET
600 mg | ORAL_TABLET | 2 refills | Status: AC
Start: 2019-09-11 — End: 2019-10-25

## 2019-09-11 MED ORDER — HYDROXYUREA 500 MG CAPSULE
500 mg | ORAL_CAPSULE | 3 refills | Status: AC
Start: 2019-09-11 — End: 2019-10-25

## 2019-09-11 MED ORDER — OXYCODONE IMMEDIATE RELEASE 10 MG TABLET
10 mg | ORAL_TABLET | 1 refills | Status: AC
Start: 2019-09-11 — End: 2019-10-25

## 2019-09-11 NOTE — Progress Notes
[facility]ult [facility] [phone]/10/2021[name] DOB [birth year] CCScheduled return for sickle cell disease.ProbPatient Active Problem List Diagnosis ? Eustachian tube disorder, bilateral ? Tonsillar and adenoid hypertrophy ? Sickle cell disease, type SS (HC Code) ? 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 Hx acute chest syndrome (HC Code) ? Hx cholecystectomy MDIn MD in [place] where he is in school, q24m, sickle cell clinic. Advised to have MD in [facility] Brief HistorySickle cell disease (HbSS)Multiple hospitalizations for acute sickle cell painAvascular necrosis of left femoral headHx acute chest syndromeHx blood transfusionOther PMHxSurgical Hx1/15	Cholecystectomy3/16	PE tubes2017	Wisdom teeth removedFMHxMedCurrent Outpatient Medications Medication Sig ? hydroxyurea (HYDREA) 500 mg capsule 3 tab daily for blood. ? ibuprofen (ADVIL,MOTRIN) 600 mg tablet Up to 4 tablets daily as needed for pain. ? naloxone (NARCAN) 4 mg/actuation spray Use 1 spray in 1 nostril for suspected opioid overdose. May repeat in 2 minutes in other nostril with new device if minimal or no response. ? oxyCODONE (OXYCONTIN) 10 mg 12 hr extended release tablet 1 tablet every 12 hours as needed for persistent pain. D57.2 ? oxyCODONE (ROXICODONE) 10 mg Immediate Release tablet 1 - 3 tablets every 3 h as needed for pain. No current facility-administered medications for this visit.  AllergiesNo Known Allergies Interval Hx/ROSWounds from trauma now healed.Occasional pain. Uses long acting med to sleep, short acting for acute pain. Last took med 3 d ago. Taking hydroxyurea. Only virtual graduation.PEBP 111/61  - Pulse 74  - Temp 98.1 ?F (36.7 ?C) (Oral)  - Resp 20  - Wt 66.7 kg  - SpO2 97%  - BMI 18.88 kg/m?  Gen: appears and sounds well.Interval Lab/ImagingNo results found for requested labs within last 120 days. Imp/PlanCovid-19?	Discussed 4/20.?	09/11/2019: Rec'd vaccination when eligible. Sickle cell disease (Hbxx)?	02/18/2019: On hydroxyurea 500 mg 3 tab daily. Reluctant to increase dose. Sent names of 3 new drugs, glutamine, voxelotor, crizanlizumab. ?	03/20/2019: Read about 2 of 3 meds. No interest at this time. ?	09/11/2019?	Hydroxyurea: Taking 3 tab daily.?	Glutamine: not interested?	Voxelotor: no indication?	Crizanlizumab: not interestedTransfusion Hx?	09/11/2019: Transfusion [place] [facility] in [place]. Social situation?	Lives: parents in [place]. 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 Interests: hanging out, applying for jobs?	09/11/2019: Continues to live with parents. 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 Looking for job with [employer]. 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 Intermittent pain?	Typical sickle cell pain?	Oxycodone 10 mg tab and oxycodone ERT 10 mg #60 uses irregularly but rationally for persistent pain crisis. Last dispensed 7/19 according to PMP. ?	02/18/2019: doing well on oxycodone IR 10 mg #60 and oxycodone ERT 10 mg #60 every month. ?	09/11/2019: Continue as above. Medical marijuana?	Discussed 07/17/17. Certified and accessed thereafter. ?	09/11/2019: Email to [facility] to allow me to update certificatoin. Health MaintenanceFYI: 3/10/2021Urine tox screen: OK 07/17/17.PMP: OK 3/10/2021Naloxone: in possession 3/10/2021Urine prot/creat ratio: 0.09 1/19Iron status: 643 7/17IV access: OKCigarettes: noAlcohol: occassionalEye: Sees community MD ophth Dr Luanne Bras: reports q9mVaccine/AntibodiesInfluenza: 10/19PPV23: 10/15, 3/14PCV13: 7/12Men-4: 7/14, 7/11Men-B: 7/19Tdap: 6/11Hep A: Ab pos 1/19Hep B: sAb neg 1/19.  Dose #1 09/07/17; Dose #2 10/14/19Hep C: Ab neg 1/19Varicella: Ab pos 8/18. HPV: 7/19, 7/14, 8/13HIV: neg 1/19GuidelineInfluenza	Annually				Varicella	Evidence of immunity:PPV23		2 doses 5 y apart; peds doses count			Hx vaccine x 2		3rd dose age 31 					[place] born before 100		8 wks from PCV13					Hx varicella			1 mo from Men-4/Men-B 				Hx varicella zosterPCV13		1 lifetime dose						Ab pos		1 y from PPV23								1 mo from Men-4/Men-B 		Zoster		60+Men-4		2 or 3 doses depending upon vaccine		2 mo apart		1 mo from PPV23/PCV13		HPV		Women thru  age 26Men-B		2 doses 2 mo apart		1 mo from PPV23/PCV13				0, 1, 4 moTdap		Every 10 years				HIV		Ab screenHep A		2 doses 6 mo apartHep B		0, 1, 4 moHep C		Ab screen onceDispositionF2F [place] or [place] 4 wks. Tdap and Men-B next visitJohn Toribio Harbour, MD

## 2019-09-24 ENCOUNTER — Encounter
Admit: 2019-09-24 | Payer: PRIVATE HEALTH INSURANCE | Attending: Hematology & Oncology | Primary: Student in an Organized Health Care Education/Training Program

## 2019-09-24 DIAGNOSIS — D571 Sickle-cell disease without crisis: Secondary | ICD-10-CM

## 2019-09-25 ENCOUNTER — Encounter
Admit: 2019-09-25 | Payer: PRIVATE HEALTH INSURANCE | Attending: Hematology & Oncology | Primary: Student in an Organized Health Care Education/Training Program

## 2019-09-30 LAB — OXYCODONE CONFIRMATION URINE (LAB ORDER ONLY) (YH)
BKR OXYCODONE, LCMSMS, UR: POSITIVE — AB
BKR OXYMORPHONE, LCMSMS, UR: POSITIVE — AB

## 2019-10-08 ENCOUNTER — Telehealth
Admit: 2019-10-08 | Payer: PRIVATE HEALTH INSURANCE | Primary: Student in an Organized Health Care Education/Training Program

## 2019-10-08 NOTE — Telephone Encounter
Called patient for covid pre-screening questions

## 2019-10-09 ENCOUNTER — Ambulatory Visit
Admit: 2019-10-09 | Payer: BLUE CROSS/BLUE SHIELD | Attending: Hematology & Oncology | Primary: Student in an Organized Health Care Education/Training Program

## 2019-10-09 ENCOUNTER — Inpatient Hospital Stay
Admit: 2019-10-09 | Discharge: 2019-10-09 | Payer: BLUE CROSS/BLUE SHIELD | Primary: Student in an Organized Health Care Education/Training Program

## 2019-10-09 ENCOUNTER — Encounter
Admit: 2019-10-09 | Payer: PRIVATE HEALTH INSURANCE | Attending: Family | Primary: Student in an Organized Health Care Education/Training Program

## 2019-10-09 DIAGNOSIS — Z0000 Encounter for general adult medical examination without abnormal findings: Secondary | ICD-10-CM

## 2019-10-09 DIAGNOSIS — Z23 Encounter for immunization: Secondary | ICD-10-CM

## 2019-10-09 DIAGNOSIS — D571 Sickle-cell disease without crisis: Secondary | ICD-10-CM

## 2019-10-09 MED ORDER — DIPHTH,PERTUS(ACEL)TETANUS(PF)2LF-(2.5-5-3-5MCG)-5 LF/0.5 ML IM SUSP
0.5 mL | Freq: Once | INTRAMUSCULAR | Status: CP
Start: 2019-10-09 — End: ?
  Administered 2019-10-09: 18:00:00 0.5 mL via INTRAMUSCULAR

## 2019-10-09 MED ORDER — HEPATITIS B VIRUS VACCINE RECOMB (PF) 20 MCG/ML INTRAMUSCULAR SYRINGE
20 mcg/mL | Freq: Once | INTRAMUSCULAR | Status: CP
Start: 2019-10-09 — End: ?
  Administered 2019-10-09: 18:00:00 20 mL via INTRAMUSCULAR

## 2019-10-09 NOTE — Progress Notes
Given engerix and tetanus shot on both upper arm.

## 2019-10-09 NOTE — Progress Notes
Adult Sickle Cell Program[facility] [phone]/7/2021[name] DOB 11/15/1997 CCScheduled return for sickle cell disease.ProbPatient Active Problem List Diagnosis ? Eustachian tube disorder, bilateral ? Tonsillar and adenoid hypertrophy ? Sickle cell disease, type SS (HC Code) ? 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 Hx acute chest syndrome (HC Code) ? Hx cholecystectomy MDIn MD in [place] where he is in school, q68m, sickle cell clinic. Advised to have MD in [facility]. Brief HistorySickle cell disease (HbSS)Multiple hospitalizations for acute sickle cell painAvascular necrosis of left femoral headHx acute chest syndromeHx blood transfusionOther PMHxSurgical Hx1/15	Cholecystectomy3/16	PE tubes2017	Wisdom teeth removedFMHxMedCurrent Outpatient Medications Medication Sig ? hydroxyurea (HYDREA) 500 mg capsule 3 tab daily for blood. ? ibuprofen (ADVIL,MOTRIN) 600 mg tablet Up to 4 tablets daily as needed for pain. ? naloxone (NARCAN) 4 mg/actuation spray Use 1 spray in 1 nostril for suspected opioid overdose. May repeat in 2 minutes in other nostril with new device if minimal or no response. ? oxyCODONE (OXYCONTIN) 10 mg 12 hr extended release tablet 1 tablet every 12 hours as needed for persistent pain. D57.2 ? oxyCODONE (ROXICODONE) 10 mg Immediate Release tablet 1 - 3 tablets every 3 h as needed for pain. No current facility-administered medications for this visit.  AllergiesNo Known Allergies Interval Hx/ROSDoing OK. Taking pain med mainly at night time. Last took 3 d ago.PEBP 122/64  - Pulse 60  - Temp 97.9 ?F (36.6 ?C)  - Resp 19  - Wt 65.9 kg  - SpO2 99%  - BMI 18.65 kg/m?  No acute distressNo rash to limited examNo ptosisNormal respiratory effortNormal posture/gaitAlert; appropriately responsive to questionsAffect normalInterval Lab/ImagingResults in Past 120 DaysResult Component Current Result Alanine Aminotransferase (ALT) 14 (09/11/2019) Albumin 4.8 (09/11/2019) Alkaline Phosphatase 84 (09/11/2019) Aspartate Aminotransferase (AST) 27 (09/11/2019) Bilirubin, Direct 0.3 (09/11/2019) Calcium 9.2 (09/11/2019) Chloride 107 (09/11/2019) CO2 25 (09/11/2019) Creatinine 0.79 (09/11/2019) Hematocrit 23.5 (L) (09/11/2019) Hemoglobin 8.4 (L) (09/11/2019) MCV 107.8 (H) (09/11/2019) Platelets 447 (H) (09/11/2019) Potassium 4.5 (09/11/2019) Protein/Creatinine Ratio, Urine, Random 0.05 (09/11/2019) Sodium 139 (09/11/2019) Total Bilirubin 1.7 (H) (09/11/2019) Total Protein 7.2 (09/11/2019) WBC 6.3 (09/11/2019) Imp/PlanCovid-19?	Discussed 4/20.?	09/11/2019: Rec'd vaccination when eligible. Sickle cell disease (Hbxx)?	02/18/2019: On hydroxyurea 500 mg 3 tab daily. Reluctant to increase dose. Sent names of 3 new drugs, glutamine, voxelotor, crizanlizumab. ?	03/20/2019: Read about 2 of 3 meds. No interest at this time. ?	10/07/2019?	Hydroxyurea: Taking 3 tab daily.?	Glutamine: not interested?	Voxelotor: no indication?	Crizanlizumab: not interestedTransfusion Hx?	09/11/2019: Transfusion [place] [facility] [place] [facility] in [place]. Social situation?	Lives: parents in [place]. 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 Interests: hanging out, applying for jobs?	10/09/2019: Continues to live with parents. 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 Looking for job with [employer].  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 Lot's of travel coming up. Intermittent pain?	Typical sickle cell pain?	Oxycodone 10 mg tab and oxycodone ERT 10 mg #60 uses irregularly but rationally for persistent pain crisis. Last dispensed 7/19 according to PMP. ?	02/18/2019: doing well on oxycodone IR 10 mg #60 and oxycodone ERT 10 mg #60 every month. ?	10/09/2019: Continue as above. No request for Rx's today.Medical marijuana?	Discussed 07/17/17. Certified and accessed thereafter. ?	10/09/2019: Re-certified 3/21.Health MaintenanceFYI: 3/10/2021Urine tox screen: OK 07/17/17.PMP: OK 3/10/2021Naloxone: in possession 3/10/2021Urine prot/creat ratio: 0.09 1/19Iron status: 643 7/17IV access: OKCigarettes: noAlcohol: occassionalEye: Sees community MD ophth Dr Luanne Bras: reports q74mVaccine/AntibodiesInfluenza: 10/19PPV23: 10/15, 3/14PCV13: 7/12Men-4: 7/14, 7/11Men-B: 7/19Tdap: 6/11Hep A: Ab pos 1/19Hep B: sAb neg 1/19.  Dose #1 09/07/17; Dose #  2 10/14/19Hep C: Ab neg 1/19Varicella: Ab pos 8/18. HPV: 7/19, 7/14, 8/13HIV: neg 1/19GuidelineInfluenza	Annually				Varicella	Evidence of immunity:PPV23		2 doses 5 y apart; peds doses count			Hx vaccine x 2		3rd dose age 56 					[place] born before [birth year]		8 wks from PCV13					Hx varicella			1 mo from Men-4/Men-B 				Hx varicella zosterPCV13		1 lifetime dose						Ab pos		1 y from PPV23								1 mo from Men-4/Men-B 		Zoster		60+Men-4		2 or 3 doses depending upon vaccine		2 mo apart		1 mo from PPV23/PCV13		HPV		Women thru age 26Men-B		2 doses 2 mo apart		1 mo from PPV23/PCV13				0, 1, 4 moTdap		Every 10 years				HIV		Ab screenHep A		2 doses 6 mo apartHep B		0, 1, 4 moHep C		Ab screen onceDispositionF2F [place] or [place] 8 wks. Hep B vaccine then.Delphia Grates, MD

## 2019-10-25 ENCOUNTER — Telehealth
Admit: 2019-10-25 | Payer: PRIVATE HEALTH INSURANCE | Attending: Hematology & Oncology | Primary: Student in an Organized Health Care Education/Training Program

## 2019-10-25 ENCOUNTER — Encounter
Admit: 2019-10-25 | Payer: PRIVATE HEALTH INSURANCE | Attending: Family | Primary: Student in an Organized Health Care Education/Training Program

## 2019-10-25 DIAGNOSIS — D571 Sickle-cell disease without crisis: Secondary | ICD-10-CM

## 2019-10-25 MED ORDER — OXYCODONE IMMEDIATE RELEASE 10 MG TABLET
10 mg | ORAL_TABLET | 1 refills | Status: AC
Start: 2019-10-25 — End: 2019-11-25

## 2019-10-25 MED ORDER — IBUPROFEN 600 MG TABLET
600 mg | ORAL_TABLET | 2 refills | Status: AC
Start: 2019-10-25 — End: 2020-02-13

## 2019-10-25 MED ORDER — OXYCODONE ER 10 MG TABLET,CRUSH RESISTANT,EXTENDED RELEASE 12 HR
10 mg | ORAL_TABLET | 1 refills | Status: AC
Start: 2019-10-25 — End: 2019-11-25

## 2019-10-25 MED ORDER — HYDROXYUREA 500 MG CAPSULE
500 mg | ORAL_CAPSULE | 3 refills | Status: AC
Start: 2019-10-25 — End: 2020-01-16

## 2019-10-25 NOTE — Telephone Encounter
Pt needs a refill on Hydroxyurea, Oxycodone, OxyContin and [facility]fen [facility] [place] CTProvider is Dr Su Hilt

## 2019-11-20 ENCOUNTER — Encounter
Admit: 2019-11-20 | Payer: PRIVATE HEALTH INSURANCE | Attending: Hematology & Oncology | Primary: Student in an Organized Health Care Education/Training Program

## 2019-11-20 DIAGNOSIS — D571 Sickle-cell disease without crisis: Secondary | ICD-10-CM

## 2019-11-25 ENCOUNTER — Encounter
Admit: 2019-11-25 | Payer: PRIVATE HEALTH INSURANCE | Attending: Family | Primary: Student in an Organized Health Care Education/Training Program

## 2019-11-25 DIAGNOSIS — D571 Sickle-cell disease without crisis: Secondary | ICD-10-CM

## 2019-11-25 MED ORDER — OXYCODONE IMMEDIATE RELEASE 10 MG TABLET
10 mg | ORAL_TABLET | 1 refills | Status: SS
Start: 2019-11-25 — End: 2019-12-24

## 2019-11-25 MED ORDER — OXYCODONE ER 10 MG TABLET,CRUSH RESISTANT,EXTENDED RELEASE 12 HR
10 mg | ORAL_TABLET | 1 refills | Status: SS
Start: 2019-11-25 — End: 2019-12-24

## 2019-11-27 ENCOUNTER — Ambulatory Visit
Admit: 2019-11-27 | Payer: PRIVATE HEALTH INSURANCE | Attending: Hematology & Oncology | Primary: Student in an Organized Health Care Education/Training Program

## 2019-11-27 ENCOUNTER — Telehealth
Admit: 2019-11-27 | Payer: PRIVATE HEALTH INSURANCE | Attending: Hematology & Oncology | Primary: Student in an Organized Health Care Education/Training Program

## 2019-11-27 NOTE — Telephone Encounter
Pt was marked as a No show on 5.25, might need rescheduling.

## 2019-12-04 ENCOUNTER — Ambulatory Visit
Admit: 2019-12-04 | Payer: PRIVATE HEALTH INSURANCE | Attending: Internal Medicine | Primary: Student in an Organized Health Care Education/Training Program

## 2019-12-04 DIAGNOSIS — Z20822 Contact with and (suspected) exposure to covid-19: Secondary | ICD-10-CM

## 2019-12-20 ENCOUNTER — Emergency Department
Admit: 2019-12-20 | Payer: PRIVATE HEALTH INSURANCE | Primary: Student in an Organized Health Care Education/Training Program

## 2019-12-20 ENCOUNTER — Inpatient Hospital Stay
Admit: 2019-12-20 | Discharge: 2019-12-24 | Payer: BLUE CROSS/BLUE SHIELD | Source: Home / Self Care | Attending: Hematology & Oncology | Admitting: Orthopaedic Trauma

## 2019-12-20 ENCOUNTER — Emergency Department
Admit: 2019-12-20 | Payer: PRIVATE HEALTH INSURANCE | Attending: Diagnostic Radiology | Primary: Student in an Organized Health Care Education/Training Program

## 2019-12-20 DIAGNOSIS — S72322A Displaced transverse fracture of shaft of left femur, initial encounter for closed fracture: Secondary | ICD-10-CM

## 2019-12-20 MED ORDER — LIDOCAINE (PF) 20 MG/ML (2 %) INJECTION SOLUTION
202 mg/mL (2 %) | Freq: Once | INTRADERMAL | Status: CP
Start: 2019-12-20 — End: ?
  Administered 2019-12-21: 05:00:00 20 mL via INTRADERMAL

## 2019-12-20 MED ORDER — IOHEXOL 350 MG IODINE/ML INTRAVENOUS SOLUTION
350 mg iodine/mL | Freq: Once | INTRAVENOUS | Status: CP | PRN
Start: 2019-12-20 — End: ?
  Administered 2019-12-21: 02:00:00 350 mL via INTRAVENOUS

## 2019-12-20 MED ORDER — FENTANYL (PF) 50 MCG/ML INJECTION SOLUTION
50 mcg/mL | Freq: Once | INTRAVENOUS | Status: DC
Start: 2019-12-20 — End: 2019-12-21

## 2019-12-20 MED ORDER — LIDOCAINE HCL 10 MG/ML (1 %) INJECTION SOLUTION
101 mg/mL (1 %) | Status: CP
Start: 2019-12-20 — End: ?

## 2019-12-20 MED ORDER — FENTANYL (PF) 50 MCG/ML INJECTION SOLUTION
50 mcg/mL | Status: CP
Start: 2019-12-20 — End: ?

## 2019-12-20 MED ORDER — HYDROMORPHONE 2 MG/ML INJECTION SOLUTION
2 mg/mL | Freq: Once | INTRAVENOUS | Status: CP
Start: 2019-12-20 — End: ?
  Administered 2019-12-21: 02:00:00 2 mL via INTRAVENOUS

## 2019-12-20 MED ORDER — HYDROMORPHONE 2 MG/ML INJECTION SOLUTION
2 mg/mL | Freq: Once | INTRAVENOUS | Status: CP
Start: 2019-12-20 — End: ?
  Administered 2019-12-21: 03:00:00 2 mL via INTRAVENOUS

## 2019-12-20 MED ORDER — HYDROMORPHONE 2 MG/ML INJECTION SOLUTION
2 mg/mL | Freq: Once | INTRAVENOUS | Status: CP
Start: 2019-12-20 — End: ?
  Administered 2019-12-21: 01:00:00 2 mL via INTRAVENOUS

## 2019-12-20 MED ORDER — HYDROMORPHONE 2 MG/ML INJECTION SOLUTION
2 mg/mL | Status: CP
Start: 2019-12-20 — End: ?

## 2019-12-20 MED ORDER — FENTANYL (PF) 50 MCG/ML INJECTION SOLUTION
50 mcg/mL | Freq: Once | INTRAVENOUS | Status: CP
Start: 2019-12-20 — End: ?
  Administered 2019-12-21: 03:00:00 50 mL via INTRAVENOUS

## 2019-12-20 MED ORDER — LORAZEPAM 2 MG/ML INJECTION SOLUTION
2 mg/mL | Status: CP
Start: 2019-12-20 — End: ?

## 2019-12-20 MED ORDER — SODIUM CHLORIDE 0.9 % BOLUS (NEW BAG)
0.9 % | Freq: Once | INTRAVENOUS | Status: CP | PRN
Start: 2019-12-20 — End: ?
  Administered 2019-12-21: 02:00:00 0.9 mL/h via INTRAVENOUS

## 2019-12-20 MED ORDER — FENTANYL (PF) 50 MCG/ML INJECTION SOLUTION
50 mcg/mL | Freq: Once | INTRAVENOUS | Status: CP
Start: 2019-12-20 — End: ?
  Administered 2019-12-21: 01:00:00 50 mL via INTRAVENOUS

## 2019-12-21 ENCOUNTER — Inpatient Hospital Stay
Admit: 2019-12-21 | Payer: PRIVATE HEALTH INSURANCE | Attending: Student in an Organized Health Care Education/Training Program

## 2019-12-21 ENCOUNTER — Encounter
Admit: 2019-12-21 | Payer: PRIVATE HEALTH INSURANCE | Attending: Orthopaedic Trauma | Primary: Student in an Organized Health Care Education/Training Program

## 2019-12-21 ENCOUNTER — Inpatient Hospital Stay: Admit: 2019-12-21 | Payer: PRIVATE HEALTH INSURANCE

## 2019-12-21 DIAGNOSIS — K219 Gastro-esophageal reflux disease without esophagitis: Secondary | ICD-10-CM

## 2019-12-21 DIAGNOSIS — M87052 Idiopathic aseptic necrosis of left femur: Secondary | ICD-10-CM

## 2019-12-21 DIAGNOSIS — S72322A Displaced transverse fracture of shaft of left femur, initial encounter for closed fracture: Secondary | ICD-10-CM

## 2019-12-21 DIAGNOSIS — D7389 Other diseases of spleen: Secondary | ICD-10-CM

## 2019-12-21 DIAGNOSIS — Z7964 On hydroxyurea therapy: Secondary | ICD-10-CM

## 2019-12-21 DIAGNOSIS — D6189 Other specified aplastic anemias and other bone marrow failure syndromes: Secondary | ICD-10-CM

## 2019-12-21 DIAGNOSIS — H902 Conductive hearing loss, unspecified: Secondary | ICD-10-CM

## 2019-12-21 DIAGNOSIS — Z23 Encounter for immunization: Secondary | ICD-10-CM

## 2019-12-21 DIAGNOSIS — D5700 Hb-SS disease with crisis, unspecified: Secondary | ICD-10-CM

## 2019-12-21 DIAGNOSIS — G8929 Other chronic pain: Secondary | ICD-10-CM

## 2019-12-21 DIAGNOSIS — IMO0002 Dactylitis: Secondary | ICD-10-CM

## 2019-12-21 DIAGNOSIS — D571 Sickle-cell disease without crisis: Secondary | ICD-10-CM

## 2019-12-21 LAB — BASIC METABOLIC PANEL
BKR ANION GAP: 13 (ref 7–17)
BKR BLOOD UREA NITROGEN: 13 mg/dL (ref 6–20)
BKR BUN / CREAT RATIO: 13.3 (ref 8.0–23.0)
BKR CALCIUM: 9.9 mg/dL (ref 8.8–10.2)
BKR CHLORIDE: 105 mmol/L (ref 98–107)
BKR CO2: 23 mmol/L (ref 20–30)
BKR CREATININE: 0.98 mg/dL (ref 0.40–1.30)
BKR EGFR (AFR AMER): >60 mL/min/{1.73_m2} (ref >60)
BKR EGFR (NON AFRICAN AMERICAN): >60 mL/min/{1.73_m2} (ref >60)
BKR GLUCOSE: 136 mg/dL — ABNORMAL HIGH (ref 70–100)
BKR POTASSIUM: 4.1 mmol/L (ref 3.3–5.1)
BKR SODIUM: 141 mmol/L (ref 136–144)

## 2019-12-21 LAB — RETICULOCYTES
BKR WAM IRF: 41.1 % — ABNORMAL HIGH (ref 3.0–15.9)
BKR WAM RETICULOCYTE - ABS: 0.1754 10Ë6 cells/uL — ABNORMAL HIGH (ref 0.0230–0.1400)
BKR WAM RETICULOCYTE COUNT PCT: 8.16 % — ABNORMAL HIGH (ref 0.60–2.70)
BKR WAM RETICULOCYTE HGB EQUIVALENT: 38.9 pg — ABNORMAL HIGH (ref 28.2–35.7)

## 2019-12-21 LAB — CBC WITH AUTO DIFFERENTIAL
BKR WAM ABSOLUTE IMMATURE GRANULOCYTES: 0.1 x 1000/ÂµL (ref 0.0–0.3)
BKR WAM ABSOLUTE LYMPHOCYTE COUNT: 2.5 x 1000/ÂµL (ref 1.0–4.0)
BKR WAM ABSOLUTE NRBC: 0.6 x 1000/ÂµL — ABNORMAL HIGH (ref 0.0–0.0)
BKR WAM ANALYZER ANC: 6.9 x 1000/ÂµL (ref 1.0–11.0)
BKR WAM BASOPHIL ABSOLUTE COUNT: 0.1 x 1000/ÂµL — ABNORMAL HIGH (ref 0.0–0.0)
BKR WAM BASOPHILS: 0.5 % (ref 0.0–4.0)
BKR WAM EOSINOPHIL ABSOLUTE COUNT: 0.2 x 1000/ÂµL (ref 0.0–1.0)
BKR WAM EOSINOPHILS: 1.8 % (ref 0.0–7.0)
BKR WAM HEMATOCRIT: 23.4 % — ABNORMAL LOW (ref 37.0–52.0)
BKR WAM HEMOGLOBIN: 8.5 g/dL — ABNORMAL LOW (ref 12.0–18.0)
BKR WAM IMMATURE GRANULOCYTES: 0.7 % (ref 0.0–3.0)
BKR WAM LYMPHOCYTES: 23.9 % (ref 8.0–49.0)
BKR WAM MCH (PG): 38.8 pg — ABNORMAL HIGH (ref 27.0–31.0)
BKR WAM MCHC: 36.3 g/dL — ABNORMAL HIGH (ref 31.0–36.0)
BKR WAM MCV: 106.8 fL — ABNORMAL HIGH (ref 78.0–94.0)
BKR WAM MONOCYTE ABSOLUTE COUNT: 0.9 x 1000/ÂµL (ref 0.0–2.0)
BKR WAM MONOCYTES: 8.6 % (ref 4.0–15.0)
BKR WAM MPV: 8.8 fL (ref 6.0–11.0)
BKR WAM NEUTROPHILS: 64.5 % (ref 37.0–84.0)
BKR WAM NUCLEATED RED BLOOD CELLS: 5.8 % — ABNORMAL HIGH (ref 0.0–1.0)
BKR WAM PLATELETS: 670 x1000/ÂµL — ABNORMAL HIGH (ref 140–440)
BKR WAM RDW-CV: 15.8 % — ABNORMAL HIGH (ref 11.5–14.5)
BKR WAM RED BLOOD CELL COUNT: 2.2 M/ÂµL — ABNORMAL LOW (ref 3.8–5.9)
BKR WAM WHITE BLOOD CELL COUNT: 10.6 x1000/ÂµL — ABNORMAL HIGH (ref 4.0–10.0)

## 2019-12-21 LAB — PT/INR AND PTT (BH GH L LMW YH)
BKR INR: 1.13 (ref 0.88–1.15)
BKR PARTIAL THROMBOPLASTIN TIME: 26.7 s (ref 23.9–29.9)
BKR PROTHROMBIN TIME: 12.1 s (ref 9.6–12.3)

## 2019-12-21 LAB — ALCOHOL PANEL, GC (BH LM YH)
BKR ACETONE BLOOD2: NOT DETECTED mg/dL
BKR ETHANOL2 (GC): NOT DETECTED mg/dL
BKR ISOPROPANOL BLOOD2: NOT DETECTED mg/dL
BKR METHANOL2: NOT DETECTED mg/dL

## 2019-12-21 LAB — SARS COV-2 (COVID-19) RNA: BKR SARS-COV-2 RNA (COVID-19) (YH): NEGATIVE

## 2019-12-21 MED ORDER — SODIUM CHLORIDE 0.9 % INTRAVENOUS SOLUTION
INTRAVENOUS | Status: DC
Start: 2019-12-21 — End: 2019-12-25
  Administered 2019-12-21 – 2019-12-24 (×5): via INTRAVENOUS

## 2019-12-21 MED ORDER — ENOXAPARIN 40 MG/0.4 ML SUBCUTANEOUS SYRINGE
40 mg/0.4 mL | SUBCUTANEOUS | Status: DC
Start: 2019-12-21 — End: 2019-12-25
  Administered 2019-12-22 – 2019-12-24 (×3): 40 mg/0.4 mL via SUBCUTANEOUS

## 2019-12-21 MED ORDER — NALOXONE 0.4 MG/ML INJECTION SOLUTION
0.4 mg/mL | INTRAVENOUS | Status: DC | PRN
Start: 2019-12-21 — End: 2019-12-25

## 2019-12-21 MED ORDER — SODIUM CHLORIDE 0.9 % (FLUSH) INJECTION SYRINGE
0.9 % | INTRAVENOUS | Status: DC | PRN
Start: 2019-12-21 — End: 2019-12-25

## 2019-12-21 MED ORDER — NEOSTIGMINE METHYLSULFATE 1 MG/ML INTRAVENOUS SOLUTION
1 mg/mL | Status: DC | PRN
Start: 2019-12-21 — End: 2019-12-21
  Administered 2019-12-21: 14:00:00 1 mg/mL via INTRAVENOUS

## 2019-12-21 MED ORDER — ONDANSETRON HCL (PF) 4 MG/2 ML INJECTION SOLUTION
4 mg/2 mL | Status: DC | PRN
Start: 2019-12-21 — End: 2019-12-21
  Administered 2019-12-21: 12:00:00 4 mg/2 mL via INTRAVENOUS

## 2019-12-21 MED ORDER — KETAMINE 50 MG/ML INJECTION SOLUTION
50 mg/mL | Status: DC | PRN
Start: 2019-12-21 — End: 2019-12-21
  Administered 2019-12-21: 12:00:00 50 mg/mL via INTRAVENOUS

## 2019-12-21 MED ORDER — ENOXAPARIN 40 MG/0.4 ML SUBCUTANEOUS SYRINGE
40 mg/0.4 mL | SUBCUTANEOUS | Status: DC
Start: 2019-12-21 — End: 2019-12-21

## 2019-12-21 MED ORDER — CEFAZOLIN 1 GRAM SOLUTION FOR INJECTION
1 gram | Status: DC | PRN
Start: 2019-12-21 — End: 2019-12-21
  Administered 2019-12-21: 12:00:00 1 gram via INTRAVENOUS

## 2019-12-21 MED ORDER — KETOROLAC 15 MG/ML INJECTION SOLUTION
15 mg/mL | Freq: Once | INTRAVENOUS | Status: CP
Start: 2019-12-21 — End: ?
  Administered 2019-12-21: 15:00:00 15 mL via INTRAVENOUS

## 2019-12-21 MED ORDER — MORPHINE 4 MG/ML INTRAVENOUS SOLUTION
4 mg/mL | INTRAVENOUS | Status: DC | PRN
Start: 2019-12-21 — End: 2019-12-21
  Administered 2019-12-21: 09:00:00 4 mL via INTRAVENOUS

## 2019-12-21 MED ORDER — SENNOSIDES 8.6 MG-DOCUSATE SODIUM 50 MG TABLET
Freq: Two times a day (BID) | ORAL | Status: DC
Start: 2019-12-21 — End: 2019-12-23
  Administered 2019-12-21 – 2019-12-23 (×3): via ORAL

## 2019-12-21 MED ORDER — HYDROMORPHONE 0.5 MG/0.5 ML INJECTION SYRINGE
0.5 mg/ mL | INTRAVENOUS | Status: DC | PRN
Start: 2019-12-21 — End: 2019-12-21

## 2019-12-21 MED ORDER — SODIUM CHLORIDE 0.9 % IRRIGATION SOLUTION
0.9 % irrigation | Status: CP | PRN
Start: 2019-12-21 — End: ?
  Administered 2019-12-21: 13:00:00 0.9 % irrigation

## 2019-12-21 MED ORDER — MORPHINE 4 MG/ML INTRAVENOUS SOLUTION
4 mg/mL | INTRAVENOUS | Status: DC | PRN
Start: 2019-12-21 — End: 2019-12-21
  Administered 2019-12-21: 15:00:00 4 mL via INTRAVENOUS

## 2019-12-21 MED ORDER — MORPHINE 4 MG/ML INTRAVENOUS SOLUTION
4 mg/mL | Status: CP
Start: 2019-12-21 — End: ?

## 2019-12-21 MED ORDER — MORPHINE 4 MG/ML INTRAVENOUS SOLUTION
4 mg/mL | INTRAVENOUS | Status: DC | PRN
Start: 2019-12-21 — End: 2019-12-21

## 2019-12-21 MED ORDER — DEXAMETHASONE SODIUM PHOSPHATE 4 MG/ML INJECTION SOLUTION
4 mg/mL | Status: DC | PRN
Start: 2019-12-21 — End: 2019-12-21
  Administered 2019-12-21: 12:00:00 4 mg/mL via INTRAVENOUS

## 2019-12-21 MED ORDER — ACETAMINOPHEN 325 MG TABLET
325 mg | Freq: Four times a day (QID) | ORAL | Status: DC
Start: 2019-12-21 — End: 2019-12-25
  Administered 2019-12-21 – 2019-12-24 (×11): 325 mg via ORAL

## 2019-12-21 MED ORDER — ONDANSETRON HCL (PF) 4 MG/2 ML INJECTION SOLUTION
4 mg/2 mL | INTRAVENOUS | Status: DC | PRN
Start: 2019-12-21 — End: 2019-12-21

## 2019-12-21 MED ORDER — LACTATED RINGERS INTRAVENOUS SOLUTION
Status: DC | PRN
Start: 2019-12-21 — End: 2019-12-21
  Administered 2019-12-21: 12:00:00 via INTRAVENOUS

## 2019-12-21 MED ORDER — ROCURONIUM 10 MG/ML INTRAVENOUS SOLUTION
10 mg/mL | Status: DC | PRN
Start: 2019-12-21 — End: 2019-12-21
  Administered 2019-12-21 (×2): 10 mg/mL via INTRAVENOUS

## 2019-12-21 MED ORDER — KETOROLAC 15 MG/ML INJECTION SOLUTION
15 mg/mL | Freq: Four times a day (QID) | INTRAVENOUS | Status: DC
Start: 2019-12-21 — End: 2019-12-22
  Administered 2019-12-21 (×2): 15 mL via INTRAVENOUS

## 2019-12-21 MED ORDER — DIAZEPAM 5 MG TABLET
5 mg | Freq: Four times a day (QID) | ORAL | Status: DC | PRN
Start: 2019-12-21 — End: 2019-12-25
  Administered 2019-12-23: 05:00:00 5 mg via ORAL

## 2019-12-21 MED ORDER — POLYETHYLENE GLYCOL 3350 17 GRAM ORAL POWDER PACKET
17 gram | Freq: Every day | ORAL | Status: DC
Start: 2019-12-21 — End: 2019-12-23
  Administered 2019-12-21 – 2019-12-23 (×3): 17 gram via ORAL

## 2019-12-21 MED ORDER — OXYCODONE IMMEDIATE RELEASE 15 MG TABLET
15 mg | ORAL | Status: DC | PRN
Start: 2019-12-21 — End: 2019-12-22
  Administered 2019-12-21 (×2): 15 mg via ORAL

## 2019-12-21 MED ORDER — OXYCODONE IMMEDIATE RELEASE 5 MG TABLET
5 mg | ORAL | Status: DC | PRN
Start: 2019-12-21 — End: 2019-12-21

## 2019-12-21 MED ORDER — PHENYLEPHRINE 1 MG/10 ML (100 MCG/ML) IN 0.9 % SOD.CHLORIDE IV SYRINGE
1 mg/0 mL (00 mcg/mL) | Status: CP
Start: 2019-12-21 — End: ?

## 2019-12-21 MED ORDER — MORPHINE 4 MG/ML INTRAVENOUS SOLUTION
4 mg/mL | INTRAVENOUS | Status: DC | PRN
Start: 2019-12-21 — End: 2019-12-22

## 2019-12-21 MED ORDER — KETOROLAC 15 MG/ML INJECTION SOLUTION
15 mg/mL | Freq: Four times a day (QID) | INTRAVENOUS | Status: DC | PRN
Start: 2019-12-21 — End: 2019-12-25

## 2019-12-21 MED ORDER — SODIUM CHLORIDE 0.9 % (FLUSH) INJECTION SYRINGE
0.9 % | Freq: Three times a day (TID) | INTRAVENOUS | Status: DC
Start: 2019-12-21 — End: 2019-12-25
  Administered 2019-12-21 – 2019-12-22 (×2): 0.9 mL via INTRAVENOUS

## 2019-12-21 MED ORDER — HYDROMORPHONE 2 MG/ML INJECTION SOLUTION
2 mg/mL | Status: CP
Start: 2019-12-21 — End: ?

## 2019-12-21 MED ORDER — HYDROXYUREA 500 MG CAPSULE
500 mg | Freq: Every day | ORAL | Status: DC
Start: 2019-12-21 — End: 2019-12-22
  Administered 2019-12-21: 16:00:00 500 mg via ORAL

## 2019-12-21 MED ORDER — CEFAZOLIN IV PUSH 1 GRAM VIAL & STERILE WATER (ADULT)
Freq: Once | INTRAVENOUS | Status: CP
Start: 2019-12-21 — End: ?
  Administered 2019-12-21: 22:00:00 10.000 mL via INTRAVENOUS

## 2019-12-21 MED ORDER — PROPOFOL 10 MG/ML INTRAVENOUS EMULSION
10 mg/mL | Status: CP
Start: 2019-12-21 — End: ?

## 2019-12-21 MED ORDER — ONDANSETRON 4 MG DISINTEGRATING TABLET
4 mg | Freq: Four times a day (QID) | ORAL | Status: DC | PRN
Start: 2019-12-21 — End: 2019-12-25

## 2019-12-21 MED ORDER — HYDROMORPHONE (DILAUDID) PCA 1 MG/ML (50 ML) YNH PYXIS
1 mg/ml | INTRAVENOUS | Status: DC
Start: 2019-12-21 — End: 2019-12-25
  Administered 2019-12-21 – 2019-12-23 (×2): 1 mL via INTRAVENOUS

## 2019-12-21 MED ORDER — MIDAZOLAM (PF) 1 MG/ML INJECTION SOLUTION
1 mg/mL | Status: DC | PRN
Start: 2019-12-21 — End: 2019-12-21
  Administered 2019-12-21: 12:00:00 1 mg/mL via INTRAVENOUS

## 2019-12-21 MED ORDER — FENTANYL (PF) 50 MCG/ML INJECTION SOLUTION
50 mcg/mL | INTRAVENOUS | Status: DC | PRN
Start: 2019-12-21 — End: 2019-12-21

## 2019-12-21 MED ORDER — GLYCOPYRROLATE 0.2 MG/ML INJECTION SOLUTION
0.2 mg/mL | Status: DC | PRN
Start: 2019-12-21 — End: 2019-12-21
  Administered 2019-12-21: 14:00:00 0.2 mg/mL via INTRAVENOUS

## 2019-12-21 MED ORDER — FENTANYL (PF) 50 MCG/ML INJECTION SOLUTION
50 mcg/mL | Status: CP
Start: 2019-12-21 — End: ?

## 2019-12-21 MED ORDER — PROPOFOL 10 MG/ML INTRAVENOUS EMULSION
10 mg/mL | Status: DC | PRN
Start: 2019-12-21 — End: 2019-12-21
  Administered 2019-12-21: 12:00:00 10 mg/mL via INTRAVENOUS

## 2019-12-21 MED ORDER — BISACODYL 5 MG TABLET,DELAYED RELEASE
5 mg | Freq: Every day | ORAL | Status: DC | PRN
Start: 2019-12-21 — End: 2019-12-23

## 2019-12-21 MED ORDER — OXYCODONE IMMEDIATE RELEASE 5 MG TABLET
5 mg | ORAL | Status: DC | PRN
Start: 2019-12-21 — End: 2019-12-22

## 2019-12-21 MED ORDER — FENTANYL (PF) 50 MCG/ML INJECTION SOLUTION
50 mcg/mL | Status: DC | PRN
Start: 2019-12-21 — End: 2019-12-21
  Administered 2019-12-21 (×3): 50 mcg/mL via INTRAVENOUS

## 2019-12-21 MED ORDER — KETOROLAC 15 MG/ML INJECTION SOLUTION
15 mg/mL | Status: CP
Start: 2019-12-21 — End: ?

## 2019-12-21 MED ORDER — HYDROMORPHONE 2 MG/ML INJECTION SOLUTION
2 mg/mL | Status: DC | PRN
Start: 2019-12-21 — End: 2019-12-21
  Administered 2019-12-21 (×4): 2 mg/mL via INTRAVENOUS

## 2019-12-21 MED ORDER — KETAMINE 50 MG/ML INJECTION SOLUTION
50 mg/mL | Status: CP
Start: 2019-12-21 — End: ?

## 2019-12-21 MED ORDER — BISACODYL 10 MG RECTAL SUPPOSITORY
10 mg | Freq: Every day | RECTAL | Status: DC | PRN
Start: 2019-12-21 — End: 2019-12-23

## 2019-12-21 MED ORDER — MIDAZOLAM (PF) 1 MG/ML INJECTION SOLUTION
1 mg/mL | Status: CP
Start: 2019-12-21 — End: ?

## 2019-12-21 MED ORDER — LIDOCAINE (PF) 20 MG/ML (2 %) INJECTION SOLUTION
20 mg/mL (2 %) | Status: DC | PRN
Start: 2019-12-21 — End: 2019-12-21
  Administered 2019-12-21: 12:00:00 20 mg/mL (2 %) via INTRAVENOUS

## 2019-12-21 NOTE — Other
-  CONSULT  REQUEST  DOCUMENTATION-[facility] NOTE-Type of consult: [facility] Hematology -New Consult: [id number] Phillip Bell /Location: [facility] PERIOP SP/Pool / Brief Clinical Question: 24 yo M with sickle cell now s/p surgical stabilization of left femur, recommendation on sickle cell disease management to prevent crisis/Callback Cell Phone: [phone] / Please confirm receipt of this message by texting back ?OK?-1 - Mobile Heartbeat message sent to [facility] at 10:20 AM. Received response at 10:21.-Kiet Geer George6/19/202110:20 [facility] [phone]

## 2019-12-21 NOTE — Other
PACU to Floor Nursing Transfer NotePreop Diagnosis: Fracture, femur, shaft (HC Code) [S72.309A]Procedure Done: * No post-op diagnosis entered *Any Significant Events Intra-Op: noneAbnormal Assessment in PACU: noneLevel of Consciousness: orientedLast Set of VS:  Vitals:  12/21/19 1015 12/21/19 1030 12/21/19 1045 12/21/19 1100 BP: 130/74 (!) 146/76 131/76 134/69 Pulse: 68 81 69 68 Resp: 14 15 15 14  Temp:     TempSrc:     SpO2: 99% 100% 99% 97% Weight:     Height:     Device (Oxygen Therapy): room air O2 Flow (L/min): 5Baseline Neuro/developmental Status: WDLLabs Collected: NoSpecial Needs of the Patient: noneAntibiotics: last dose given in OR: 810aIV Access: Periph IV-single(adult) 01/21/16 0722 metacarpal  (top of hand), right over-the-needle catheter system 20 gauge (Active)   Periph IV-single(adult) 12/20/19 2115 median cubital(antecubital fossa), right over-the-needle catheter system 18 gauge Nurse (Active) SiteCare/Dressing Status/Securement dressing dry and intact 12/21/19 1000 Date Dressing Applied/Changed 12/20/19 12/21/19 0400 Next Date Dressing Change 12/27/2019 12/21/19 0400 Lumen 1 Patency/Care Patent;flushed w/o difficulty 12/21/19 0400 Site Signs asymptomatic with no redness, no swelling, no drainage 12/21/19 1000 Phlebitis 0-->no symptoms 12/21/19 1000 Infiltration/Extravasation Assessment 0-->no symptoms 12/21/19 1000 Patient Education Instructed to keep IV site dry;Instructed to call nurse if site is painful,red,swollen, burning;Instructed to call nurse if fluid leaking from site 12/21/19 0400 Daily Review of Necessity ** completed 12/21/19 1000 IV Fluids: ? sodium chloride 100 mL/hr (12/21/19 0347) Pain Assessment: Number Scale (0-10) 12/21/19 0446 Left lower leg-Pain Rating (0-10): Rest: 10  Pain Assessment: Number Scale (0-10) 12/21/19 0446 Left lower leg-Pharmaceutical Interventions: single medication modality Wound 12/21/19 0941 Incision Left leg-Incision Closure : unable to assess  Body Position: (turned)Head of Bed (HOB): HOB at 30-45 degrees   Time of Last Void or Time that Urinary Catheter was Removed Intra-Op: has not voided Additional Info: Pt easy to arouse sleeping on and off. VSS. Pain 10/10 toradol and morphine given.Dressing clean dry and intact.Contact RN (name and phone number): Adama Ferber
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
## 2019-12-21 NOTE — Other
PHARMACY-ASSISTED MEDICATION REPORTPharmacist review of the best possible medication history obtained by the pharmacy medication history technician has been performed.  Current Outpatient Medications on File Prior to Encounter Medication Sig Note Last Dose  hydroxyurea (HYDREA) 500 mg capsule 3 tab daily for blood. 12/21/2019: MedHx Tech (SM): Patient reports TAKING DIFFERENTLY only 2 capsules a day, unsure if MD is aware.      ibuprofen (ADVIL,MOTRIN) 600 mg tablet Up to 4 tablets daily as needed for pain. 12/21/2019: MedHX Tech(Samira Mawla, CPHT):  Patient reported that they are taking as prescribed, but last confirmed fill date was: [10/04/2019 #100 for 25 days supply]      naloxone (NARCAN) 4 mg/actuation spray Use 1 spray in 1 nostril for suspected opioid overdose. May repeat in 2 minutes in other nostril with new device if minimal or no response.    oxyCODONE (OXYCONTIN) 10 mg 12 hr extended release tablet 1 tablet every 12 hours as needed for persistent pain. D57.2 12/21/2019: MedHx Tech (Samira Mawla, CPHT): per pharmacy records last filled 11/25/2019 #60 for 30 days supply.   oxyCODONE (ROXICODONE) 10 mg Immediate Release tablet 1 - 3 tablets every 3 h as needed for pain. 12/21/2019: MedHx Tech (Samira Mawla, CPHT): per pharmacy records last filled 11/25/2019 #60 for 3 days supply.  Thank you,Ramona Ruark, PharmD6/19/202111:11 AMPhone: MHB

## 2019-12-21 NOTE — Other
Operative Diagnosis:Pre-op:   Fracture, femur, shaft (HC Code) [S72.309A] Patient Coded Diagnosis   Pre-op diagnosis: Fracture, femur, shaft (HC Code)  Post-op diagnosis: Fracture, femur, shaft (HC Code)  Patient Diagnosis   Pre-op diagnosis: Fracture, femur, shaft (HC Code) [S72.309A]  Post-op diagnosis:     Post-op diagnosis:   * Fracture, femur, shaft (HC Code) [S72.309A]Operative Procedure(s) :Procedure(s) (LRB):SURGICAL STABILIZATION OF LEFT FEMUR (Left)Post-op Procedure & Diagnosis ConfirmationPost-op Diagnosis: Post-op Diagnosis confirmed (no changes)Post-op Procedure: Post-op Procedure confirmed (no changes)

## 2019-12-21 NOTE — Anesthesia Pre-Procedure Evaluation
This is a 24 y.o. male scheduled for SURGICAL STABILIZATION OF LEFT FEMUR (Left ).Review of Systems/ Medical HistoryPatient summary, EKG/Cardiac Studies , Labs, pre-procedure vitals, height, weight and NPO status reviewed.Anesthesia Evaluation: Estimated body mass index is 18.65 kg/m? as calculated from the following:  Height as of this encounter: 6' 2 (1.88 m).  Weight as of this encounter: 65.9 kg. CC/HPI: 24 yo M with hx of sickle cell disease and it's associated complications p/f surgical stabilization of LEFT femur. Hx of chronic pain 2/2 sickle cell, currently on dilaudid/oxycodonePast Medical History6/6/ 2005 transfusion: Aplastic crisis (HC Code)No date: Avascular necrosis of femur head, left (HC Code)No date: Chronic pain    Comment:  sickle cell3/21/16 P.E tubes placed: Conductive hearing lossone episode  April, 1998: DactylitisNo date: GERD (gastroesophageal reflux disease)08/06/2010 Hb Electrophoresis: Hb S = 88.2%, HbF= 3.9%, Hb A2= 3.7%: Hemoglobin S-S disease (HC Code)    Comment:  FORM OF SICKLE CELL DISEASEstarted October 2013: On hydroxyurea therapy7/20/2007 and 09/28/2012: Pneumococcal vaccination given3/07/1997: Spleen sequestrationAdm 01/02/2012, ER 04/08/12, 4/1-10/03/12, 7/2-01/05/13, 12/02/13-12/03/13 , March 2016 - 2 admissions: Vasoocclusive sickle cell crisis [facility] Code)Past Surgical History:  Past Surgical History:1/15: CHOLECYSTECTOMY, LAPAROSCOPIC03/21/2016: TYMPANOSTOMY TUBE PLACEMENT    Comment:  by Dr. 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  Behavioral/Psychiatric & Syndromes: NegativeAdditional Findings: VITAL SIGNS:Temp:  (36.2 ?C-36.9 ?C) 36.9 ?CPulse:  (68-87) 68Resp:  (17-18) 17BP: (127-135)/(79-112) 135/81SpO2:  (96 %-100 %) 98 %Device (Oxygen Therapy): room airLABS:Lab Results     Component                Value               Date                     WBC                      10.6 (H)            12/20/2019               HGB                      8.5 (L)             12/20/2019               HCT                      30.0 (L)            12/20/2019               PLT                      670 (H)             12/20/2019               CHOL                     92 (L)              07/29/2011  ALT                      14                  09/11/2019               AST                      27                  09/11/2019               NA                       141                 12/20/2019               K                        3.8                 12/20/2019               CL                       105                 12/20/2019               CREATININE               1.1                 12/20/2019               BUN                      13                  12/20/2019               CO2                      23                  12/20/2019               INR                      1.13                12/20/2019               GLU                      139 (H)             12/20/2019          Physical ExamCardiovascular:  Rhythm: regularPulmonary:  Patient's breath sounds clear to auscultationAirway:  Mallampati: ITM distance: >3 FBNeck ROM: fullAnesthesia PlanASA 3 The primary anesthesia plan is  general ETT. SASAM, PIVx2, Multimodal Analgesia, Post op PACUFinal plan per attending anesthesiologist Perioperative Code Status confirmed: It is my understanding that the patient is currently designated as 'Full Code' and will remain so throughout the perioperative period.Anesthesia informed consent  obtained. Consent obtained from: patientUse of blood products: consented  The post operative pain plan is IV analgesics and per surgeon management.Plan discussed with CRNA.Anesthesiologist's Pre Op NoteI personally evaluated and examined the patient prior to the intra-operative phase of care.
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
## 2019-12-21 NOTE — Anesthesia Post-Procedure Evaluation
Anesthesia Post-op NotePatient: Phillip RiceProcedure(s):  Procedure(s) (LRB):SURGICAL STABILIZATION OF LEFT FEMUR (Left) Patient location: PACULast Vitals:  I have noted the vital signs as listed in the nursing notes.Mental status recovered: patient participates in evaluation: YesVital signs reviewed: YesRespiratory function stable:YesAirway is patent: YesCardiovascular function and hydration status stable: YesPain control satisfactory: YesNausea and vomiting control satisfactory:Yes
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
## 2019-12-21 NOTE — ED Notes
10:29 PM Report from [facility]11:26 PM Ortho at bedside for leg manipulation. Pt given 2mg  dilaudid and fentanyl.
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
## 2019-12-21 NOTE — Other
Post Anesthesia Transfer of Care NotePatient: Phillip RiceProcedure(s) Performed: Procedure(s) (LRB):SURGICAL STABILIZATION OF LEFT FEMUR (Left) Patient location: PACU Last Vitals: Vitals Value Taken Time BP 150/83 12/21/19 0957 Temp 36.3 ?C 12/21/19 0957 Pulse 73 12/21/19 0958 Resp 15 12/21/19 0958 SpO2 100 % 12/21/19 0958 Vitals shown include unvalidated device data.Level of consciousness: sedatedTransport Vital Signs:  Stable since the last set of recorded intra-operative vital signsComplications: noneIntra-operative Intake & Output and Antibiotics as per Anesthesia record and discussed with the RN.

## 2019-12-21 NOTE — Plan of Care
Plan of Care Overview/ Patient Status    A+Ox4. VSS on RA. LLE elevated, +CSM, denies numbness/tingling, ace dressing CDI. Pain controlled w/ Dilaudid PCA and Toradol.  Tolerating po well. Voiding spont in urinal. Mother then father at bedside - visitation policy explained. Problem: Adult Inpatient Plan of CareGoal: Plan of Care ReviewOutcome: Interventions implemented as appropriateGoal: Patient-Specific Goal (Individualized)Outcome: Interventions implemented as appropriateGoal: Absence of Hospital-Acquired Illness or InjuryOutcome: Interventions implemented as appropriateGoal: Optimal Comfort and WellbeingOutcome: Interventions implemented as appropriateGoal: Readiness for Transition of CareOutcome: Interventions implemented as appropriate Problem: InfectionGoal: Infection Symptom ResolutionOutcome: Interventions implemented as appropriate Problem: Fall Injury RiskGoal: Absence of Fall and Fall-Related InjuryOutcome: Interventions implemented as appropriate Problem: WoundGoal: Optimal Wound HealingOutcome: Interventions implemented as appropriate
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
## 2019-12-21 NOTE — Plan of Care
Plan of Care Overview/ Patient Status    Patient up from ED at 0430. AxO4. VSS on RA. Skeletal traction to LLE, 15lbs. Alignment maintained, weights hanging freely, connections secure. Pain managed on current regimen. +CMS. Voiding spontaneously in urinal. IVF per [facility]. Plan pending OR. See flowsheet for details. Pt down to OR at 0630. Phone, glasses and apple watch in unit safe. Problem: Adult Inpatient Plan of CareGoal: Plan of Care ReviewOutcome: Interventions implemented as appropriateGoal: Patient-Specific Goal (Individualized)Outcome: Interventions implemented as appropriateGoal: Absence of Hospital-Acquired Illness or InjuryOutcome: Interventions implemented as appropriateGoal: Optimal Comfort and WellbeingOutcome: Interventions implemented as appropriateGoal: Readiness for Transition of CareOutcome: Interventions implemented as appropriate Problem: InfectionGoal: Infection Symptom ResolutionOutcome: Interventions implemented as appropriate Problem: Fall Injury RiskGoal: Absence of Fall and Fall-Related InjuryOutcome: Interventions implemented as appropriate

## 2019-12-21 NOTE — Brief Op Note
[facility] HealthPatient Name: Phillip Bell        [id number] Patient DOB: January 16, 1996     Surgery Date: 6/19/2021Surgeon(s) and Role:   Artist Pais, [name], MD - PrimaryAssistant(s):Resident: Riven Beebe, [place], MDStaff:  Circulator: Rodena Piety, RNRadiology Technologist: Cholock, DanaScrub Person: Bennye Alm Student: Kerry Hough, STUDENTPre-Op Diagnosis: Fracture, femur, shaft (HC Code) [S72.309A] Procedure(s) and Anesthesia Type:   * SURGICAL STABILIZATION OF LEFT FEMUR - GENERALOperative Findings (enter relevant operative findings; do not refer to an operative report that is not yet transcribed): left femur fracture. Radiographic evidence of AVNSigns of infection present at the time of surgery at the operative site: None Blood and Blood Products: none                 Drains:  noneImplants: Implant Name Type Inv. Item Serial No. Manufacturer Lot No. LRB No. Used Action NAIL EX FEM [id number] RTRGD AN - [id number] Implant NAIL EX FEM [id number] RTRGD AN  Ferol Luz [employer] [id number] Left 1 Implanted SCREW LOK 5X56MM F IM NL - [id number] Implant SCREW LOK [id number] F IM NL  J J [employer] [facility] [employer]  Left 1 Implanted SCREW LOK 5X36MM F IM NL - [id number] Implant SCREW LOK [id number] F IM NL  J J [employer]  Left 1 Implanted SCREW LOK [id number] F IM NL - [id number] Implant SCREW LOK [id number] F IM NL  J J [employer]  Left 1 Implanted  Specimens: * No specimens in log * Clinical Staging: N/AEBL: 50 mL       Post Operative Diagnosis: LEFT femur fracturePostop Plan: 24 y.o. male s/p L femur IMN with Dr. Artist Pais on 12/21/2019- Home medications as appropriate- WBAT - Dressing(s): Reinforce as needed. Compressive wrap for 2-3 days- Pain control: Scheduled tylenol, oxycodone SS, IV morphine for breakthrough. Okay for Toradol- Anticoagulation: Lovenox in house, d/c on eliquis until 5 weeks s/p injury- Xrays: intra-op- Antibiotics: Ancef x1 dose post-op- Foley: not used- Drain: not used- Sutures/staples: Remove at follow up appointment- Follow-up with Dr. Artist Pais in 10-14 days- Discharge planning: home vs rehab per PTJordan Jayah Balthazar, MD6/19/20219:53 AM

## 2019-12-21 NOTE — Utilization Review (ED)
UM Status: Commercial - IP

## 2019-12-22 LAB — CBC WITH AUTO DIFFERENTIAL
BKR WAM ABSOLUTE IMMATURE GRANULOCYTES: 0.1 x 1000/ÂµL (ref 0.0–0.3)
BKR WAM ABSOLUTE LYMPHOCYTE COUNT: 2.2 x 1000/ÂµL (ref 1.0–4.0)
BKR WAM ABSOLUTE NRBC: 0.3 x 1000/ÂµL — ABNORMAL HIGH (ref 0.0–0.0)
BKR WAM ANALYZER ANC: 6.1 x 1000/ÂµL (ref 1.0–11.0)
BKR WAM BASOPHIL ABSOLUTE COUNT: 0 x 1000/ÂµL (ref 0.0–0.0)
BKR WAM BASOPHILS: 0.2 % (ref 0.0–4.0)
BKR WAM EOSINOPHIL ABSOLUTE COUNT: 0 x 1000/ÂµL (ref 0.0–1.0)
BKR WAM EOSINOPHILS: 0.4 % (ref 0.0–7.0)
BKR WAM HEMATOCRIT: 17 % — CL (ref 37.0–52.0)
BKR WAM HEMOGLOBIN: 6 g/dL — CL (ref 12.0–18.0)
BKR WAM IMMATURE GRANULOCYTES: 0.5 % (ref 0.0–3.0)
BKR WAM LYMPHOCYTES: 21.6 % (ref 8.0–49.0)
BKR WAM MCH (PG): 38 pg — ABNORMAL HIGH (ref 27.0–31.0)
BKR WAM MCHC: 35.3 g/dL (ref 31.0–36.0)
BKR WAM MCV: 107.6 fL — ABNORMAL HIGH (ref 78.0–94.0)
BKR WAM MONOCYTE ABSOLUTE COUNT: 1.6 x 1000/ÂµL (ref 0.0–2.0)
BKR WAM MONOCYTES: 16.4 % — ABNORMAL HIGH (ref 4.0–15.0)
BKR WAM MPV: 9.3 fL (ref 6.0–11.0)
BKR WAM NEUTROPHILS: 60.9 % (ref 37.0–84.0)
BKR WAM NUCLEATED RED BLOOD CELLS: 2.8 % — ABNORMAL HIGH (ref 0.0–1.0)
BKR WAM PLATELETS: 460 x1000/ÂµL — ABNORMAL HIGH (ref 140–440)
BKR WAM RDW-CV: 15.4 % — ABNORMAL HIGH (ref 11.5–14.5)
BKR WAM RED BLOOD CELL COUNT: 1.6 M/ÂµL — ABNORMAL LOW (ref 3.8–5.9)
BKR WAM WHITE BLOOD CELL COUNT: 10 x1000/ÂµL (ref 4.0–10.0)

## 2019-12-22 LAB — BASIC METABOLIC PANEL
BKR ANION GAP: 10 (ref 7–17)
BKR BLOOD UREA NITROGEN: 12 mg/dL (ref 6–20)
BKR BUN / CREAT RATIO: 13.6 (ref 8.0–23.0)
BKR CALCIUM: 8.4 mg/dL — ABNORMAL LOW (ref 8.8–10.2)
BKR CHLORIDE: 104 mmol/L (ref 98–107)
BKR CO2: 24 mmol/L (ref 20–30)
BKR CREATININE: 0.88 mg/dL (ref 0.40–1.30)
BKR EGFR (AFR AMER): >60 mL/min/{1.73_m2} (ref >60)
BKR EGFR (NON AFRICAN AMERICAN): >60 mL/min/{1.73_m2} (ref >60)
BKR GLUCOSE: 134 mg/dL — ABNORMAL HIGH (ref 70–100)
BKR POTASSIUM: 4 mmol/L (ref 3.3–5.1)
BKR SODIUM: 138 mmol/L (ref 136–144)

## 2019-12-22 LAB — HEMOGLOBIN AND HEMATOCRIT, BLOOD
BKR WAM HEMATOCRIT: 24.7 % — ABNORMAL LOW (ref 37.0–52.0)
BKR WAM HEMOGLOBIN: 8.3 g/dL — ABNORMAL LOW (ref 12.0–18.0)

## 2019-12-22 MED ORDER — HYDROXYUREA 500 MG CAPSULE
500 mg | Freq: Every day | ORAL | Status: DC
Start: 2019-12-22 — End: 2019-12-25
  Administered 2019-12-22 – 2019-12-24 (×3): 500 mg via ORAL

## 2019-12-22 MED ORDER — OXYCODONE IMMEDIATE RELEASE 5 MG TABLET
5 mg | Freq: Three times a day (TID) | ORAL | Status: DC
Start: 2019-12-22 — End: 2019-12-23
  Administered 2019-12-23 (×2): 5 mg via ORAL

## 2019-12-22 MED ORDER — HYDROXYUREA 500 MG CAPSULE
500 mg | Freq: Three times a day (TID) | ORAL | Status: DC
Start: 2019-12-22 — End: 2019-12-22

## 2019-12-22 MED ORDER — HYDROXYUREA 500 MG CAPSULE
500 mg | Freq: Every day | ORAL | Status: DC
Start: 2019-12-22 — End: 2019-12-22
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
## 2019-12-22 NOTE — Other
Hematology Consult Progress NoteNAME: Phillip RiceDOB: February 11, 1996 AGE: 24 y.o.MRN: [id number] DATE OF SERVICE: 6/20/2021CONSULTED BY: Attending Provider: Luis Abed, MD [phone] FOR CONSULT: Sickle Cell DiseasePrimary Hematologist: Lodema Hong, MDInterim History: Hb 6 this AM- was transfused 2 units pRBC overnight25 bolus PCA doses for 12.5mg  dilaudid.Today although still having pain states that the pump has worked well for him to control painUsing incentive spirometer every 1-2 hours- seen at bedside.No cough, chest pain, back pain, respiratory symptoms.Getting senna 2 tab BID and received miralax last 2 days.  No bowel movementHe felt good about staying on PCA.Working with PT, walking around with crutches.Otherwise 12 point-ROS were negative.Medications: Scheduled Meds:Current Facility-Administered Medications Medication Dose Route Frequency Provider Last Rate Last Admin ? acetaminophen (TYLENOL) tablet 975 mg  975 mg Oral Q6H Brand, [place], MD   975 mg at 12/22/19 1315 ? enoxaparin (LOVENOX) syringe 40 mg  40 mg Subcutaneous Q24H Brand, [place], MD   40 mg at 12/22/19 1316 ? hydroxyurea (HYDREA) capsule 1,500 mg  1,500 mg Oral Daily Buford Dresser, MD   1,500 mg at 12/22/19 1314 ? polyethylene glycol (MIRALAX) packet 17 g  17 g Oral Daily Lavonna Monarch, MD   17 g at 12/22/19 1317 ? senna-docusate (SENNA-PLUS) 8.6-50 mg per tablet 2 tablet  2 tablet Oral BID Lavonna Monarch, MD   2 tablet at 12/22/19 1316 ? sodium chloride 0.9 % flush 3 mL  3 mL IV Push Q8H Lavonna Monarch, MD   3 mL at 12/22/19 1317 Continuous Infusions:? HYDROmorphone   ? sodium chloride 100 mL/hr (12/22/19 0053) PRN Meds:bisacodyL, bisacodyL, diazePAM, ketorolac, naloxone, ondansetron, sodium chloridePhysical Exam: VS: BP 117/73  - Pulse (!) 97  - Temp 98.2 ?F (36.8 ?C) (Oral)  - Resp 18  - Ht 6' 2 (1.88 m) - Wt 65.9 kg  - SpO2 99%  - BMI 18.65 kg/m? Gen: In pain  But laying in the bedHEENT: No scleral icterusNeck:suppleCV: RRR, no murmurLungs: Entirely clear throughoutAbdomen: No distensionExtremities: L leg with large cast on.  Data: Labs: Recent Labs Lab 06/18/212119 06/18/212119 06/20/210008 WBC 10.6*  --  10.0 HGB 8.5*  --  6.0* HCT 23.4*   < > 17.0* PLT 670*  --  460*  < > = values in this interval not displayed.  Recent Labs Lab 06/18/212119 06/20/210008 NEUTROPHILS 64.5 60.9  Recent Labs Lab 06/18/212118 06/18/212118 06/20/210008 NA 141  --  138 K 4.1   < > 4.0 CL 105  --  104 CO2 23  --  24 BUN 13  --  12 CREATININE 0.98   < > 0.88 GLU 136*   < > 134* ANIONGAP 13  --  10  < > = values in this interval not displayed.  Recent Labs Lab 06/18/212118 06/20/210008 CALCIUM 9.9 8.4*  No results for input(s): ALBUMIN, AST, ALT, ALKPHOS, BILITOT in the last 168 hours. Recent Labs Lab 06/18/212120 PTT 26.7 LABPROT 12.1 INR 1.13  Assessment/ Recs #HbSS s/p L femur fixationPrimary post-op measures to decrease the risk of significant sickle cell related complications are every 2 hours incentive spirometry while awake guided by nursing.  This was discussed with the patient at the bedside and the primary team.Continue hydroxyurea 1500 dailyContinue PCA- recommend hydromorphone 0.5 q10 min 1 h lock out 3 mgWould start oxycodone 5-10mg  q8 hours for pain control- as he may need this overnight while sleeping- will offer longer acting pain control than PCA doses.Senna-doc increase to 3 tabs BIDAgree with miralaxCan also do ketorolac 15mg  PRN. APAP 975  TID scheduled.Offer lidocaine patches, hot/cold packsCan reassess pain control in the AM and see if he feels more comfortable remaining in house with pain control or potentially transitioning to oral tier tomorrow vs. Turning PCA off and going home with PO meds.  If feels like he will need several more days, can discuss transferring to sickle cell team in AM.Recheck Hb today for response to transfusion, then again tomorrow AM.?Discussed with attending, [name]. Addendum to follow.All questions were answered during our discussion and Phillip Bell is comfortable with the plan above.[name], MDClinical Fellow, PGY-5Hematology and Medical [facility], [facility]
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
## 2019-12-22 NOTE — Plan of Care
Plan of Care Overview/ Patient Status    A+Ox4. VSS on RA. LLE elevated, +CSM, denies numbness/tingling, ace dressing CDI. Pain controlled w/ Dilaudid PCA and Toradol. Tolerated second unit of PRBC. Dr Everlena Cooper unable to reach hematology to clarify if a third unit was needed - plan to get H&H at 6pm instead of third unit. Cleared by physical therapy for home. Crutches w/ supervision.  Problem: Adult Inpatient Plan of CareGoal: Plan of Care ReviewOutcome: Interventions implemented as appropriateGoal: Patient-Specific Goal (Individualized)Outcome: Interventions implemented as appropriateGoal: Absence of Hospital-Acquired Illness or InjuryOutcome: Interventions implemented as appropriateGoal: Optimal Comfort and WellbeingOutcome: Interventions implemented as appropriateGoal: Readiness for Transition of CareOutcome: Interventions implemented as appropriate Problem: InfectionGoal: Infection Symptom ResolutionOutcome: Interventions implemented as appropriate Problem: Fall Injury RiskGoal: Absence of Fall and Fall-Related InjuryOutcome: Interventions implemented as appropriate Problem: WoundGoal: Optimal Wound HealingOutcome: Interventions implemented as appropriate

## 2019-12-22 NOTE — Progress Notes
Orthopedic Surgery Post-Operative NoteS: Mr. Phillip Bell is 24 y.o. male Day of Surgery s/p SURGICAL STABILIZATION OF LEFT FEMURPatient transferred to floor from PACU in stable condition. No acute events post-operatively. Pain is well controlled. No complaints at this time. Denies nausea, vomiting, chest pain, SOB.O:Temp:  [97.2 ?F (36.2 ?C)-98.6 ?F (37 ?C)] 98.3 ?F (36.8 ?C)Pulse:  [62-87] 62Resp:  [14-18] 16BP: (127-150)/(69-112) 137/82SpO2:  [96 %-100 %] 97 %Device (Oxygen Therapy): room airO2 Flow (L/min):  [5] 5I/O last 3 completed shifts:In: 121.7 [I.V.:121.7]Out: 900 [Urine:900]No acute distress, comfortableAnswers appropriatelyUnlabored breathing on RALLEDressing c/d/i+EHL/FHL/GCS/TASILT grossly sp/dp/t nerve distDP pulse palpableToes wwp,  CR<2sLab Results Component Value Date  WBC 10.6 (H) 12/20/2019  HGB 8.5 (L) 12/20/2019  HCT 30.0 (L) 12/20/2019  MCV 106.8 (H) 12/20/2019  PLT 670 (H) 12/20/2019 Lab Results Component Value Date  CREATININE 1.1 12/20/2019  BUN 13 12/20/2019  NA 141 12/20/2019  K 3.8 12/20/2019  CL 105 12/20/2019  CO2 23 12/20/2019 Imaging: intraop fluoro showed implants in good positionA/P: Phillip Bell is a 24 y.o. male s/p L femur IMN with Dr. Artist Pais on 12/21/2019, doing well postoperatively. - Home medications as appropriate- WBAT - Dressing(s): Reinforce as needed. Compressive wrap for 2-3 days- Pain control: Scheduled tylenol, dilaudid PCA,  Toradol PRN- Anticoagulation: Lovenox in house, d/c on eliquis until 5 weeks s/p injury- Xrays: intra-op- Antibiotics: Ancef x1 dose post-op- Sutures/staples: Remove at follow up appointment- Follow-up with Dr. Artist Pais in 10-14 days- appreciate Heme rec on sickle cell management- Discharge planning: home vs rehab per PT?Buford Dresser, MD 12/21/2019

## 2019-12-22 NOTE — Plan of Care
Plan of Care Overview/ Patient Status    Pt AO x 4, vss on room air. C/o pain to LLE, 7/10. PCA w/dilaudid in place w/+ effect. IVF NS infusing @ 100 ml/hr. LLE elevated, +CMS and no numbness and tingling. LLE w/Ace wrapped, c/d/i. Urinal @ bedside. LBM per pt, 6/18. Pt is independent in bed. Pt resting in bed. Wctm@2amPt  H/H is low per labs, (6.0 / 17.0) covering Md notified and orders 1 PRBC to be transfused. Blood given w/o reaction. Pt was able to get up OOB w/RW.@0550Pt  second blood transfusion currently running.Problem: Adult Inpatient Plan of CareGoal: Plan of Care ReviewOutcome: Interventions implemented as appropriateGoal: Patient-Specific Goal (Individualized)Outcome: Interventions implemented as appropriateGoal: Absence of Hospital-Acquired Illness or InjuryOutcome: Interventions implemented as appropriateGoal: Optimal Comfort and WellbeingOutcome: Interventions implemented as appropriateGoal: Readiness for Transition of CareOutcome: Interventions implemented as appropriate Problem: WoundGoal: Optimal Wound HealingOutcome: Interventions implemented as appropriate Problem: Fall Injury RiskGoal: Absence of Fall and Fall-Related InjuryOutcome: Interventions implemented as appropriate Problem: InfectionGoal: Infection Symptom ResolutionOutcome: Interventions implemented as appropriate

## 2019-12-23 ENCOUNTER — Encounter
Admit: 2019-12-23 | Payer: PRIVATE HEALTH INSURANCE | Attending: Clinical | Primary: Student in an Organized Health Care Education/Training Program

## 2019-12-23 LAB — CBC WITH AUTO DIFFERENTIAL
BKR WAM ABSOLUTE IMMATURE GRANULOCYTES: 0.1 x 1000/ÂµL (ref 0.0–0.3)
BKR WAM ABSOLUTE LYMPHOCYTE COUNT: 2 x 1000/ÂµL (ref 1.0–4.0)
BKR WAM ABSOLUTE NRBC: 0.5 x 1000/ÂµL — ABNORMAL HIGH (ref 0.0–0.0)
BKR WAM ANALYZER ANC: 9.1 x 1000/ÂµL (ref 1.0–11.0)
BKR WAM BASOPHIL ABSOLUTE COUNT: 0 x 1000/ÂµL (ref 0.0–0.0)
BKR WAM BASOPHILS: 0.2 % (ref 0.0–4.0)
BKR WAM EOSINOPHIL ABSOLUTE COUNT: 0.1 x 1000/ÂµL (ref 0.0–1.0)
BKR WAM EOSINOPHILS: 0.8 % (ref 0.0–7.0)
BKR WAM HEMATOCRIT: 23.6 % — ABNORMAL LOW (ref 37.0–52.0)
BKR WAM HEMOGLOBIN: 8.2 g/dL — ABNORMAL LOW (ref 12.0–18.0)
BKR WAM IMMATURE GRANULOCYTES: 0.7 % (ref 0.0–3.0)
BKR WAM LYMPHOCYTES: 15.5 % (ref 8.0–49.0)
BKR WAM MCH (PG): 34.7 pg — ABNORMAL HIGH (ref 27.0–31.0)
BKR WAM MCHC: 34.7 g/dL (ref 31.0–36.0)
BKR WAM MCV: 100 fL — ABNORMAL HIGH (ref 78.0–94.0)
BKR WAM MONOCYTE ABSOLUTE COUNT: 1.6 x 1000/ÂµL (ref 0.0–2.0)
BKR WAM MONOCYTES: 12.6 % (ref 4.0–15.0)
BKR WAM MPV: 9.1 fL (ref 6.0–11.0)
BKR WAM NEUTROPHILS: 70.2 % (ref 37.0–84.0)
BKR WAM NUCLEATED RED BLOOD CELLS: 3.6 % — ABNORMAL HIGH (ref 0.0–1.0)
BKR WAM PLATELETS: 520 x1000/ÂµL — ABNORMAL HIGH (ref 140–440)
BKR WAM RDW-CV: 19.8 % — ABNORMAL HIGH (ref 11.5–14.5)
BKR WAM RED BLOOD CELL COUNT: 2.4 M/ÂµL — ABNORMAL LOW (ref 3.8–5.9)
BKR WAM WHITE BLOOD CELL COUNT: 13 x1000/ÂµL — ABNORMAL HIGH (ref 4.0–10.0)

## 2019-12-23 LAB — BASIC METABOLIC PANEL
BKR ANION GAP: 10 (ref 7–17)
BKR BLOOD UREA NITROGEN: 7 mg/dL (ref 6–20)
BKR BUN / CREAT RATIO: 11.5 (ref 8.0–23.0)
BKR CALCIUM: 8.6 mg/dL — ABNORMAL LOW (ref 8.8–10.2)
BKR CHLORIDE: 107 mmol/L (ref 98–107)
BKR CO2: 22 mmol/L (ref 20–30)
BKR CREATININE: 0.61 mg/dL (ref 0.40–1.30)
BKR EGFR (AFR AMER): >60 mL/min/{1.73_m2} (ref >60)
BKR EGFR (NON AFRICAN AMERICAN): >60 mL/min/{1.73_m2} (ref >60)
BKR GLUCOSE: 114 mg/dL — ABNORMAL HIGH (ref 70–100)
BKR POTASSIUM: 4.2 mmol/L (ref 3.3–5.1)
BKR SODIUM: 139 mmol/L (ref 136–144)

## 2019-12-23 LAB — HEMOGLOBINOPATHY EVALUATION SCREENING
BKR HEMOGLOBIN A2 QUANTITATION: 3.1 % (ref 0.0–4.0)
BKR HEMOGLOBIN F QUANTITATION: 16.3 % — ABNORMAL HIGH (ref 0.0–2.0)
BKR HGB S: 80.6 % — ABNORMAL HIGH

## 2019-12-23 MED ORDER — OXYCODONE IMMEDIATE RELEASE 5 MG TABLET
5 mg | ORAL | Status: DC
Start: 2019-12-23 — End: 2019-12-25
  Administered 2019-12-23: 16:00:00 5 mg via ORAL

## 2019-12-23 MED ORDER — DIPHENHYDRAMINE 25 MG CAPSULE
25 mg | Freq: Four times a day (QID) | ORAL | Status: DC | PRN
Start: 2019-12-23 — End: 2019-12-25

## 2019-12-23 MED ORDER — OXYCODONE IMMEDIATE RELEASE 30 MG TABLET
30 mg | ORAL | Status: DC
Start: 2019-12-23 — End: 2019-12-25

## 2019-12-23 MED ORDER — ZZ IMS TEMPLATE
ORAL | Status: DC
Start: 2019-12-23 — End: 2019-12-25
  Administered 2019-12-23 – 2019-12-24 (×8): 5 mg via ORAL

## 2019-12-23 MED ORDER — SENNOSIDES 8.6 MG TABLET
8.6 mg | ORAL | Status: DC
Start: 2019-12-23 — End: 2019-12-25
  Administered 2019-12-23 – 2019-12-24 (×5): 8.6 mg via ORAL

## 2019-12-23 NOTE — Progress Notes
SOCIAL WORK NOTEPatient Name: Phillip RiceMedical Record Number: [id number] Date of Birth: 1997-09-25Social Work Follow Up    Most Recent Value Document Type  Progress Note (For Inpatient/ED Only) Prior psychosocial assessment has been documented within 30 days of this hospitalization  No Reason for Encounter  Psycho-Social Distress Intervention  Distress Assessment & Resolution Distress Assessment & Resolution  Validation, Empathy Source of Information  Patient, Medical Team Medical Team Comment  Sickle Cell Team Record Reviewed  Yes Level of Care  Inpatient Social Work Cluster  Oncology Identified Clinical/Disposition, Issues/Barriers:  Primary support. Coordination of care. Intervention(s)/Summary  20 minutes were spent face to face with Phillip Bell. We was engaged and cooperative. I introduced myself and I explained to him about my role. I provided my contact information and I encouraged him to contact me as needed and he agreed. Phillip Bell shared ?that his in pain and he cannot wait to leave and go home?. I provided active listening, I validated his feelings and concerns and I encouraged him to share all his questions with the medical team. Phillip Bell did not share any other psychosocial concerns at this time. I will discuss with the team and I will remain available as needed. Collaboration with Treatment Team/Community Providers/Family:  Sickle cell team Outcome  Resolved Handoff Required?  No Barriers to Discharge  no barriers identified Next Steps/Plan (including hand-off):  Social work intervention completed at this time. Signature:  Shaune Pollack, LCSW Contact Information:  [phone]

## 2019-12-23 NOTE — Progress Notes
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) I offered to pray with pt but he expressed that he was experiencing physical discomfort and did not want to pray for that reason I explained what the chaplaincy is before leaving shortly thereafter Spiritual Assessment:Referral Source: Chaplain Initiated Information Obtained From: Patient Mood: Other (Comment)(Pt was experiencing physical discomfort)Relational SupportTypes of Relational Support: Unable to Assess Religious Affiliation: Ephriam Knuckles  Spiritual Resources: PrayerSpiritual Interventions:Spiritual Intervention Index**Date of Spiritual Visit: 06/21/21Visit Type: Initial VisitLanguage or special accommodation rendered?: NoHow Well Do You Speak English?: (P) very wellDo You Speak a Language Other Than English at Home?: (P) noResponding Chaplain: InternConsulted With: NurseSpiritual/Religious Support Provided: Introduction to [employer] ServicesFollow-Up Visit Needed: NoOutcome: Pt was made aware of chaplaincy services Plan: Pt is now aware of chaplaincy services and I will visit him as needed  Total Consult Time: 15 minutes	Signed: Liliane Channel 6/21/202110:24 AM
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
## 2019-12-23 NOTE — Plan of Care
Plan of Care Overview/ Patient Status    SOCIAL WORK NOTEPatient Name: Shaune RiceMedical Record Number: [id number] Date of Birth: 04-16-1997Social Work Follow Up    Most Recent Value Document Type  Progress Note (For Inpatient/ED Only) Prior psychosocial assessment has been documented within 30 days of this hospitalization  Yes, I have reviewed the most recent assessment and entered updates Prior Assessment Date  12/23/19 Reason for Encounter  Psycho-Social Distress Intervention  Advocacy Advocacy  Medical staff Distress Assessment & Resolution  Motivational Interviewing Substance Use Assessment & Referral  Alcohol abuse assessment, Drug abuse assessment Source of Information  Patient Medical Team Comment  Sickle Cell LCSW Elease Hashimoto Record Reviewed  Yes Level of Care  Inpatient Social Work Cluster  Medicine/Surgery Identified Clinical/Disposition, Issues/Barriers:  Social Work providing continued support Intervention(s)/Summary  10 minutes spent in communication with Sickle Cell Social Worker who will now be assigned to Mr Gassmann' case. Collaboration with Treatment Team/Community Providers/Family:  LCSW Outcome  Resolved Handoff Required?  Yes Barriers to Discharge  medical workup continued Next Steps/Plan (including hand-off):  Hand off to [employer] social worker Elease Hashimoto.  Trauma team will sign off. Signature:  Jaimen Melone, lcsw Contact Information:  [phone]

## 2019-12-23 NOTE — Plan of Care
Plan of Care Overview/ Patient Status    Pt AO x 4, vss on room air. C/o pain to LLE, 7/10. PCA w/dilaudid in place w/+ effect. prn oxy and tylenol also given. IVF NS infusing @ 100 ml/hr. LLE elevated, +CMS and no numbness and tingling. LLE w/Ace wrapped, c/d/i. Urinal @ bedside. OOB w/RW. LBM per pt, 6/18. Pt is independent in bed. Pt resting in bed. WctmProblem: Adult Inpatient Plan of CareGoal: Plan of Care ReviewOutcome: Interventions implemented as appropriateGoal: Patient-Specific Goal (Individualized)Outcome: Interventions implemented as appropriateGoal: Absence of Hospital-Acquired Illness or InjuryOutcome: Interventions implemented as appropriateGoal: Optimal Comfort and WellbeingOutcome: Interventions implemented as appropriateGoal: Readiness for Transition of CareOutcome: Interventions implemented as appropriate Problem: Physical Therapy GoalsGoal: Physical Therapy GoalsOutcome: Interventions implemented as appropriate Problem: WoundGoal: Optimal Wound HealingOutcome: Interventions implemented as appropriate Problem: Fall Injury RiskGoal: Absence of Fall and Fall-Related InjuryOutcome: Interventions implemented as appropriate

## 2019-12-24 ENCOUNTER — Telehealth
Admit: 2019-12-24 | Payer: PRIVATE HEALTH INSURANCE | Attending: Hematology & Oncology | Primary: Student in an Organized Health Care Education/Training Program

## 2019-12-24 DIAGNOSIS — Z79899 Other long term (current) drug therapy: Secondary | ICD-10-CM

## 2019-12-24 DIAGNOSIS — G8929 Other chronic pain: Secondary | ICD-10-CM

## 2019-12-24 DIAGNOSIS — Z20822 Contact with and (suspected) exposure to covid-19: Secondary | ICD-10-CM

## 2019-12-24 DIAGNOSIS — M879 Osteonecrosis, unspecified: Secondary | ICD-10-CM

## 2019-12-24 DIAGNOSIS — D571 Sickle-cell disease without crisis: Secondary | ICD-10-CM

## 2019-12-24 DIAGNOSIS — S72322A Displaced transverse fracture of shaft of left femur, initial encounter for closed fracture: Secondary | ICD-10-CM

## 2019-12-24 MED ORDER — OXYCODONE IMMEDIATE RELEASE 10 MG TABLET
10 mg | ORAL_TABLET | 1 refills | Status: AC
Start: 2019-12-24 — End: 2020-01-08

## 2019-12-24 MED ORDER — POLYETHYLENE GLYCOL ORAL POWDER (BOWEL PREP)
17 gram/dose | Freq: Once | ORAL | Status: CP
Start: 2019-12-24 — End: ?
  Administered 2019-12-24: 17:00:00 17 gram/dose via ORAL

## 2019-12-24 MED ORDER — SENNOSIDES 8.6 MG TABLET
8.6 mg | ORAL_TABLET | ORAL | 1 refills | Status: AC | PRN
Start: 2019-12-24 — End: 2021-07-09

## 2019-12-24 MED ORDER — APIXABAN 2.5 MG TABLET
2.5 mg | ORAL_TABLET | Freq: Two times a day (BID) | ORAL | 1 refills | Status: AC
Start: 2019-12-24 — End: 2020-01-16

## 2019-12-24 MED ORDER — OXYCODONE ER 10 MG TABLET,CRUSH RESISTANT,EXTENDED RELEASE 12 HR
10 mg | ORAL_TABLET | 1 refills | Status: AC
Start: 2019-12-24 — End: 2020-01-16

## 2019-12-24 NOTE — Discharge Instructions
It has been a pleasure serving on your medical team at [facility].  If you have any questions, please contact Dr. Lodema Hong or Grayland Jack at [phone].  Thank you.
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
## 2019-12-24 NOTE — Plan of Care
Plan of Care Overview/ Patient Status    Phillip Bell was discharged via wheelchair accompanied by his girlfriend.Pt is understanding of discharge instructions and recommended follow up care as per the after visit summary.  Written discharge instructions provided. Denies any further questions. Vital signs    Vitals:  12/24/19 0456 12/24/19 0831 12/24/19 1146 12/24/19 1558 BP: 134/73 131/79 (!) 149/84 (!) 150/49 Pulse: 87 73 (!) 99 (!) 94 Resp: 16 20 (!) 21 18 Temp: 99 ?F (37.2 ?C) 98.1 ?F (36.7 ?C) 98.2 ?F (36.8 ?C) 98.7 ?F (37.1 ?C) TempSrc: Oral Oral Oral Oral SpO2: 99% 100% 99% 100% Weight:     Height:     Pt is a&ox4, vitals as doc on RA, slightly tachycardic. Pt reports pain to LLE. PCA pump paused per [facility], goal for pt's pain to be managed w/ PO Oxycodone. + CMS, slight edema to L foot. ACE dressing CDI. WBAT to LLE. OOB stand-by w/ crutches. Voids in the urinal, LBM: 12/20/2019, pt educated on importance of bowel regimen, bowel meds given per MAR,ok to dc without BM today per PA [place] Jack. See flowsheet for additional details.Problem: Adult Inpatient Plan of CareGoal: Plan of Care ReviewOutcome: Interventions implemented as appropriateGoal: Patient-Specific Goal (Individualized)Outcome: Interventions implemented as appropriateGoal: Absence of Hospital-Acquired Illness or InjuryOutcome: Interventions implemented as appropriateGoal: Optimal Comfort and WellbeingOutcome: Interventions implemented as appropriateGoal: Readiness for Transition of CareOutcome: Interventions implemented as appropriate Problem: InfectionGoal: Infection Symptom ResolutionOutcome: Interventions implemented as appropriate Problem: Fall Injury RiskGoal: Absence of Fall and Fall-Related InjuryOutcome: Interventions implemented as appropriate Problem: WoundGoal: Optimal Wound HealingOutcome: Interventions implemented as appropriate Problem: Physical Therapy GoalsGoal: Physical Therapy GoalsOutcome: Interventions implemented as appropriate
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
## 2019-12-24 NOTE — Telephone Encounter
Pls call pt back. He stated he is currently in the hospital and would like to know when will Dr Su Hilt is going to see him.Clemetine Marker

## 2019-12-24 NOTE — Plan of Care
Plan of Care Overview/ Patient Status    Pt is Aox4.  Pain control with dilaudid PCA pump, oxycodone and tylenol.  Pt has + CMS to LLE.  Ace dressing is c/d/I.  Pt is a SBA with crutches oob. Pt is voiding spont in urinal.  Plan is on going.  Will CTM.  Please see flow sheet for details. Problem: Adult Inpatient Plan of CareGoal: Plan of Care ReviewOutcome: Outcome(s) achievedGoal: Patient-Specific Goal (Individualized)Outcome: Outcome(s) achievedGoal: Absence of Hospital-Acquired Illness or InjuryOutcome: Outcome(s) achievedGoal: Optimal Comfort and WellbeingOutcome: Outcome(s) achievedGoal: Readiness for Transition of CareOutcome: Outcome(s) achieved Problem: InfectionGoal: Infection Symptom ResolutionOutcome: Outcome(s) achieved Problem: Fall Injury RiskGoal: Absence of Fall and Fall-Related InjuryOutcome: Outcome(s) achieved Problem: WoundGoal: Optimal Wound HealingOutcome: Outcome(s) achieved Problem: Physical Therapy GoalsGoal: Physical Therapy GoalsOutcome: Outcome(s) achieved
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
## 2019-12-24 NOTE — Plan of Care
Plan of Care Overview/ Patient Status    Pt is a&ox4, vitals as doc on RA. Pt reports pain to LLE. PCA pump running per [facility], Oxycodone scheduled and offered to pt per Hematology team. + CMS. ACE dressing CDI. WBAT to LLE. OOB stand-by w/ crutches. Voids I the urina, LBM: 12/20/2019, pt educated on importance of bowel regimen. See flowsheet for additional details.Problem: Adult Inpatient Plan of CareGoal: Plan of Care ReviewOutcome: Interventions implemented as appropriateGoal: Patient-Specific Goal (Individualized)Outcome: Interventions implemented as appropriateGoal: Absence of Hospital-Acquired Illness or InjuryOutcome: Interventions implemented as appropriateGoal: Optimal Comfort and WellbeingOutcome: Interventions implemented as appropriateGoal: Readiness for Transition of CareOutcome: Interventions implemented as appropriate Problem: InfectionGoal: Infection Symptom ResolutionOutcome: Interventions implemented as appropriate Problem: Fall Injury RiskGoal: Absence of Fall and Fall-Related InjuryOutcome: Interventions implemented as appropriate Problem: WoundGoal: Optimal Wound HealingOutcome: Interventions implemented as appropriate Problem: Physical Therapy GoalsGoal: Physical Therapy GoalsOutcome: Interventions implemented as appropriate

## 2019-12-24 NOTE — Plan of Care
Plan of Care Overview/ Patient Status    CM Finalized D/C Plan    Most Recent Value Finalized Plan Expected Discharge Date  12/24/19 Discharge Disposition  Home or Self Care Post acute care services secured W10 complete  N/A Physician documentation required  AVS (Patient Instructions), Prescriptions Discharge Coordination/Progress  Pts d/c order is in. PT eval 6/20, OT eval 6/22 cleared for home with family support. Has all DME (crutches at home ). No homecare needs identified. CM D/C Readiness PASRR completed and approved  N/A Authorization number obtained, if required  N/A Is there a 3 day INPATIENT Qualifying stay for Medicare Patients?  N/A Medicare IM- signed, dated, timed and scanned, if required  N/A DME Authorized/Delivered  N/A No needs identified/ follow up with PCP/MD  Yes Post acute care services secured W10 complete  N/A Pri Completed and Accepted   N/A  Darrick Meigs RNFloat Care [phone]

## 2019-12-24 NOTE — Plan of Care
[facility]		Spiritual Care NotePurpose: Sickle Cell team rounds Subjective: Sickle Cell chaplain visited patient on morning team rounds. Patient did not have any specific needs from chaplain today but chaplain will continue to visit to build trust, rapport, and continue to learn how to best support. Spiritual Interventions:Spiritual Intervention Index**Date of Spiritual Visit: 06/22/21Visit Type: Initial VisitLanguage or special accommodation rendered?: NoHow Well Do You Speak English?: very wellDo You Speak a Language Other Than English at Home?: noLanguage needed: None, Patient Speaks EnglishIntervention Type: Spiritual VisitResponding Chaplain: Other ChaplainConsulted With: Other (Comment)(Sickle Cell team)Spiritual/Religious Support Provided: Introduction to [employer], CompanionshipFollow-Up Visit Needed: YesPlan: Chaplain will continue to follow with Sickle Cell Team. Total Consult Time: 15 minutes	Signed: Rev. Karena Addison, M.Div., BCCChaplain, Oncology ServicesM-F 8:00-4:30: [phone] Chaplain: [phone]/22/202110:27 AM

## 2019-12-25 ENCOUNTER — Encounter
Admit: 2019-12-25 | Payer: PRIVATE HEALTH INSURANCE | Primary: Student in an Organized Health Care Education/Training Program
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
## 2019-12-26 ENCOUNTER — Telehealth
Admit: 2019-12-26 | Payer: PRIVATE HEALTH INSURANCE | Attending: Orthopaedic Trauma | Primary: Student in an Organized Health Care Education/Training Program

## 2019-12-26 ENCOUNTER — Encounter
Admit: 2019-12-26 | Payer: PRIVATE HEALTH INSURANCE | Primary: Student in an Organized Health Care Education/Training Program

## 2019-12-26 NOTE — Telephone Encounter
Patient calling to schedule post of appt 1 week after discharge date. sx left femur with Dr Artist Pais. Nothing available to care center.

## 2019-12-26 NOTE — Progress Notes
Called patient to provide discharge follow-up, left message to return my call on answering machine.Nicholes Calamity, [facility] Nurse Advisor

## 2019-12-30 ENCOUNTER — Telehealth
Admit: 2019-12-30 | Payer: PRIVATE HEALTH INSURANCE | Attending: Orthopaedic Trauma | Primary: Student in an Organized Health Care Education/Training Program

## 2019-12-30 NOTE — Telephone Encounter
Pt called to schedule a follow up with Dr. Artist Pais. Pt stated they will like to been seen at next available location which is [place] on 7/13 Pt will like a call back regarding his stiches
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
## 2020-01-03 ENCOUNTER — Encounter
Admit: 2020-01-03 | Payer: PRIVATE HEALTH INSURANCE | Attending: Hematology & Oncology | Primary: Student in an Organized Health Care Education/Training Program

## 2020-01-08 ENCOUNTER — Encounter
Admit: 2020-01-08 | Payer: PRIVATE HEALTH INSURANCE | Attending: Family | Primary: Student in an Organized Health Care Education/Training Program

## 2020-01-08 ENCOUNTER — Telehealth
Admit: 2020-01-08 | Payer: PRIVATE HEALTH INSURANCE | Attending: Hematology & Oncology | Primary: Student in an Organized Health Care Education/Training Program

## 2020-01-08 DIAGNOSIS — D571 Sickle-cell disease without crisis: Secondary | ICD-10-CM

## 2020-01-08 MED ORDER — OXYCODONE IMMEDIATE RELEASE 10 MG TABLET
10 mg | ORAL_TABLET | 1 refills | Status: AC
Start: 2020-01-08 — End: 2020-01-16

## 2020-01-08 NOTE — Telephone Encounter
Pt of Dr. Su Hilt Looking for a refill on his oxyCODONE (OXYCONTIN) 10 mg 12 hr extended release [facility]CV[facility] Dimitri Ped is all out He had surgery at [facility] on June 18th

## 2020-01-13 NOTE — Telephone Encounter
Attending Surgeon: Dr. Artist Pais                 Other Surgeon/Resident: _____               Contact Person: Ortho 										Or [phone] Date: 12/21/19 Inhouse Location: EP 7  Location:    ?EPOR   SPOR                       ?WPOR  ?NPOR              ?Cysto   ?Lithotripsy               Time preferred: To follow         Priority LEVEL: 2              ? POST OP ICU BED  Medical Record Number: [id number]           Birthdate: Jun 25, 1996                Gender: maleLast Name: Bell   First Name:  Phillip          Middle Initial:  Patient Allergies: Patient has no known allergies.                           ?Known Latex AllergySpecial Needs:  ?Hard of hearing   ?Deaf - needs sign language interpreter   ?Interpreter Required/Language: ____________                            __ Other                                                                                                                                   Anticipated Anesthesia:  GeneralSide of Body: LeftCPT Code: 27506Surgeon?s Description: Surgical Stabilization of Left femurDiagnosis:     Left Femur fracture               ICD-10 Codes                                       S72.309A                                            Estimated Length of Surgery: 2 hours  Special Instructions for JX:[id number]: supine  Tables: OSI Flat top Special Instruments:    Ortho Basic, Dr. B 1/2, large C arm, C armor , synthes, bone foam               Special Equipment:  Antegrade femoral nail

## 2020-01-14 ENCOUNTER — Encounter
Admit: 2020-01-14 | Payer: PRIVATE HEALTH INSURANCE | Attending: Orthopaedic Trauma | Primary: Student in an Organized Health Care Education/Training Program

## 2020-01-14 ENCOUNTER — Encounter
Admit: 2020-01-14 | Payer: BLUE CROSS/BLUE SHIELD | Attending: Orthopaedic Trauma | Primary: Student in an Organized Health Care Education/Training Program

## 2020-01-14 ENCOUNTER — Inpatient Hospital Stay
Admit: 2020-01-14 | Discharge: 2020-01-14 | Payer: BLUE CROSS/BLUE SHIELD | Primary: Student in an Organized Health Care Education/Training Program

## 2020-01-14 DIAGNOSIS — D7389 Other diseases of spleen: Secondary | ICD-10-CM

## 2020-01-14 DIAGNOSIS — Z7964 On hydroxyurea therapy: Secondary | ICD-10-CM

## 2020-01-14 DIAGNOSIS — S72362A Displaced segmental fracture of shaft of left femur, initial encounter for closed fracture: Secondary | ICD-10-CM

## 2020-01-14 DIAGNOSIS — K219 Gastro-esophageal reflux disease without esophagitis: Secondary | ICD-10-CM

## 2020-01-14 DIAGNOSIS — D5700 Hb-SS disease with crisis, unspecified: Secondary | ICD-10-CM

## 2020-01-14 DIAGNOSIS — D571 Sickle-cell disease without crisis: Secondary | ICD-10-CM

## 2020-01-14 DIAGNOSIS — Z23 Encounter for immunization: Secondary | ICD-10-CM

## 2020-01-14 DIAGNOSIS — H902 Conductive hearing loss, unspecified: Secondary | ICD-10-CM

## 2020-01-14 DIAGNOSIS — G8929 Other chronic pain: Secondary | ICD-10-CM

## 2020-01-14 DIAGNOSIS — D6189 Other specified aplastic anemias and other bone marrow failure syndromes: Secondary | ICD-10-CM

## 2020-01-14 DIAGNOSIS — M87052 Idiopathic aseptic necrosis of left femur: Secondary | ICD-10-CM

## 2020-01-14 DIAGNOSIS — IMO0002 Dactylitis: Secondary | ICD-10-CM

## 2020-01-14 NOTE — Progress Notes
Review of Systems Constitutional: Negative.  HENT: Negative.  Eyes: Negative.  Respiratory: Negative.  Cardiovascular: Negative.  Gastrointestinal: Negative.  Endocrine: Negative.  Genitourinary: Negative.  Musculoskeletal:      PO LLE DOS 6/19. Pain 7/10 when bending knee Skin: Negative.  Allergic/Immunologic: Negative.  Neurological: Negative.  Hematological: Negative.
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
## 2020-01-16 ENCOUNTER — Ambulatory Visit
Admit: 2020-01-16 | Payer: BLUE CROSS/BLUE SHIELD | Attending: Hematology & Oncology | Primary: Student in an Organized Health Care Education/Training Program

## 2020-01-16 DIAGNOSIS — D571 Sickle-cell disease without crisis: Secondary | ICD-10-CM

## 2020-01-16 MED ORDER — OXYCODONE ER 10 MG TABLET,CRUSH RESISTANT,EXTENDED RELEASE 12 HR
10 mg | ORAL_TABLET | 1 refills | Status: AC
Start: 2020-01-16 — End: 2020-02-21

## 2020-01-16 MED ORDER — HYDROXYUREA 500 MG CAPSULE
500 mg | ORAL_CAPSULE | 3 refills | Status: AC
Start: 2020-01-16 — End: 2020-03-31

## 2020-01-16 MED ORDER — OXYCODONE IMMEDIATE RELEASE 10 MG TABLET
10 mg | ORAL_TABLET | 1 refills | Status: AC
Start: 2020-01-16 — End: 2020-02-21

## 2020-01-16 NOTE — Progress Notes
[facility]ult [facility] [phone]/15/2021[name] DOB 29-May-1997 CCScheduled return for sickle cell disease.ProbPatient Active Problem List Diagnosis ? Eustachian tube disorder, bilateral ? Tonsillar and adenoid hypertrophy ? Sickle cell disease, type SS (HC Code) ? 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 Hx acute chest syndrome (HC Code) ? Hx cholecystectomy ? Closed displaced segmental fracture of shaft of left femur, initial encounter (HC Code) ? Sickle cell disease without crisis [facility] Code) MDIn MD in [place] where he is in school, q72m, sickle cell clinic. Advised to have MD in [facility]. Brief HistorySickle cell disease (HbSS)Multiple hospitalizations for acute sickle cell painAvascular necrosis of left femoral headHx acute chest syndromeHx blood transfusionOther PMHxSurgical Hx1/15	Cholecystectomy3/16	PE tubes2017	Wisdom teeth removedFMHxMedCurrent Outpatient Medications Medication Sig ? apixaban (ELIQUIS) 2.5 mg tablet Take 1 tablet (2.5 mg total) by mouth 2 (two) times daily. To prevent blood clot while recovering from surgery (Patient not taking: Reported on 01/14/2020) ? hydroxyurea (HYDREA) 500 mg capsule 3 tab daily for blood. ? ibuprofen (ADVIL,MOTRIN) 600 mg tablet Up to 4 tablets daily as needed for pain. ? naloxone (NARCAN) 4 mg/actuation spray Use 1 spray in 1 nostril for suspected opioid overdose. May repeat in 2 minutes in other nostril with new device if minimal or no response. ? oxyCODONE (OXYCONTIN) 10 mg 12 hr extended release tablet 1 tablet every 12 hours as needed for persistent pain. D57.2 ? oxyCODONE (ROXICODONE) 10 mg Immediate Release tablet 1 - 3 tablets every 3 h as needed for pain. ? senna (SENOKOT) 8.6 mg tablet Take 2 tablets (17.2 mg total) by mouth every 3 (three) hours as needed for constipation. No current facility-administered medications for this visit.  AllergiesNo Known Allergies Interval Hx/ROSConcerned about limited range of motion of left knee. Left femur Fx 6/21, internal fixation. Saw ortho 2 d ago. PEBP 117/72  - Pulse (!) 94  - Temp 98.7 ?F (37.1 ?C) (Oral)  - Resp 18  - Ht 6' 2 (1.88 m)  - Wt 63.2 kg  - SpO2 99%  - BMI 17.89 kg/m?  No acute distressNo rash to limited examNo ptosisNormal respiratory effortNormal posture seated on eam table.Left knee extension to 75 d, flexion to 85 d. No effusion, redness, tenderness.Alert; appropriately responsive to questionsAffect normalInterval Lab/ImagingResults in Past 120 DaysResult Component Current Result Calcium 8.6 (L) (12/23/2019) Chloride 107 (12/23/2019) CO2 22 (12/23/2019) Creatinine 0.61 (12/23/2019) Hematocrit 23.6 (L) (12/23/2019) Hemoglobin 8.2 (L) (12/23/2019) INR 1.13 (12/20/2019) MCV 100.0 (H) (12/23/2019) Platelets 520 (H) (12/23/2019) Potassium 4.2 (12/23/2019) Sodium 139 (12/23/2019) WBC 13.0 (H) (12/23/2019) Imp/PlanCovid-19?	Discussed 4/20.?	01/16/2020: Vaccinated x 2. Sickle cell disease (Hbxx)?	02/18/2019: On hydroxyurea 500 mg 3 tab daily. Reluctant to increase dose. Sent names of 3 new drugs, glutamine, voxelotor, crizanlizumab. ?	03/20/2019: Read about 2 of 3 meds. No interest at this time. ?	01/16/2020?	Hydroxyurea: Taking 3 tab daily.?	Glutamine: not interested?	Voxelotor: no indication?	Crizanlizumab: not interestedTransfusion Hx?	09/11/2019: [facility] in [place]. Social situation?	Lives: parents in [place]. 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 Interests: hanging out, applying for jobs?	01/16/2020: Continues to live with parents. 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 Also considering moving to [employer] (college there).  Femur Fx7/15/2021: Injury/surgery 12/20/19. Discharged on apixaban, apparently stopped it early. Intermittent pain?	Typical sickle cell pain?	Oxycodone 10 mg tab and oxycodone ERT 10 mg #60 uses irregularly but rationally for persistent pain crisis. Last dispensed 7/19  according to PMP. ?	02/18/2019: doing well on oxycodone IR 10 mg #60 and oxycodone ERT 10 mg #60 every month. ?	10/09/2019: Continue as above. No request for Rx's today.Medical marijuana?	Discussed 07/17/17. Certified and accessed thereafter. ?	10/09/2019: Re-certified 3/21.?	01/16/2020: Trying to be clean.Health MaintenanceFYI: 3/10/2021Urine tox screen: OK 07/17/17.PMP: OK 3/10/2021Naloxone: in possession 3/10/2021Urine prot/creat ratio: 0.09 1/19Iron status: 643 7/17IV access: OKCigarettes: noAlcohol: occassionalEye: Sees community MD ophth Phillip Bell: reports q36mVaccine/AntibodiesInfluenza: 10/19PPV23: 10/15, 3/14PCV13: 7/12Men-4: 7/14, 7/11Men-B: 7/19Tdap: 4/21Hep A: Ab pos 1/19Hep B: sAb neg 1/19.  Dose #1 09/07/17; Dose #2 04/16/18 #4/21Hep C: Ab neg 1/19Varicella: Ab pos 8/18. HPV: 7/19, 7/14, 8/13HIV: neg 1/19GuidelineInfluenza	Annually				Varicella	Evidence of immunity:PPV23		2 doses 5 y apart; peds doses count			Hx vaccine x 2		3rd dose age 58 					[place] born before 39		8 wks from PCV13					Hx varicella			1 mo from Men-4/Men-B 				Hx varicella zosterPCV13		1 lifetime dose						Ab pos		1 y from PPV23								1 mo from Men-4/Men-B 		Zoster		60+Men-4		2 or 3 doses depending upon vaccine		2 mo apart		1 mo from PPV23/PCV13		HPV		Women thru age 26Men-B		2 doses 2 mo apart		1 mo from PPV23/PCV13				0, 1, 4 moTdap		Every 10 years				HIV		Ab screenHep A		2 doses 6 mo apartHep B		0, 1, 4 moHep C		Ab screen onceDispositionF2F Phillip Bell or Phillip Bell 8 wks. Hep B vaccine then.Phillip Grates, MD

## 2020-01-20 ENCOUNTER — Inpatient Hospital Stay
Admit: 2020-01-20 | Discharge: 2020-01-20 | Payer: BLUE CROSS/BLUE SHIELD | Primary: Student in an Organized Health Care Education/Training Program

## 2020-01-20 ENCOUNTER — Encounter
Admit: 2020-01-20 | Payer: PRIVATE HEALTH INSURANCE | Attending: Pediatric Dentistry | Primary: Student in an Organized Health Care Education/Training Program

## 2020-01-20 DIAGNOSIS — D571 Sickle-cell disease without crisis: Secondary | ICD-10-CM

## 2020-01-20 DIAGNOSIS — S72362A Displaced segmental fracture of shaft of left femur, initial encounter for closed fracture: Secondary | ICD-10-CM

## 2020-01-20 DIAGNOSIS — H902 Conductive hearing loss, unspecified: Secondary | ICD-10-CM

## 2020-01-20 DIAGNOSIS — G8929 Other chronic pain: Secondary | ICD-10-CM

## 2020-01-20 DIAGNOSIS — D5700 Hb-SS disease with crisis, unspecified: Secondary | ICD-10-CM

## 2020-01-20 DIAGNOSIS — M87052 Idiopathic aseptic necrosis of left femur: Secondary | ICD-10-CM

## 2020-01-20 DIAGNOSIS — IMO0002 Dactylitis: Secondary | ICD-10-CM

## 2020-01-20 DIAGNOSIS — M25662 Stiffness of left knee, not elsewhere classified: Secondary | ICD-10-CM

## 2020-01-20 DIAGNOSIS — K219 Gastro-esophageal reflux disease without esophagitis: Secondary | ICD-10-CM

## 2020-01-20 DIAGNOSIS — D7389 Other diseases of spleen: Secondary | ICD-10-CM

## 2020-01-20 DIAGNOSIS — Z23 Encounter for immunization: Secondary | ICD-10-CM

## 2020-01-20 DIAGNOSIS — Z7964 On hydroxyurea therapy: Secondary | ICD-10-CM

## 2020-01-20 DIAGNOSIS — D6189 Other specified aplastic anemias and other bone marrow failure syndromes: Secondary | ICD-10-CM
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
## 2020-01-24 ENCOUNTER — Inpatient Hospital Stay
Admit: 2020-01-24 | Discharge: 2020-01-24 | Payer: BLUE CROSS/BLUE SHIELD | Primary: Student in an Organized Health Care Education/Training Program

## 2020-01-24 DIAGNOSIS — S72362A Displaced segmental fracture of shaft of left femur, initial encounter for closed fracture: Secondary | ICD-10-CM

## 2020-01-24 DIAGNOSIS — R29898 Other symptoms and signs involving the musculoskeletal system: Secondary | ICD-10-CM

## 2020-01-24 DIAGNOSIS — R269 Unspecified abnormalities of gait and mobility: Secondary | ICD-10-CM

## 2020-01-24 NOTE — Other
REHABILITATION SERVICES AT [facility] Services At [facility] [place] [address] Number: [id number] Number: [phone] Therapy Orthopedic Daily Note7/23/2021Patient Name:  Phillip RiceMedical Record Number:  [id number] Date of Birth:  [birth year]Therapist:  Izetta Dakin DPT, OCSReferring Provider:  Luis Abed, MDICD-10 Diagnosis(es):Problem List         ICD-10-CM   PT - S/P ORIF (L) femur fx - July '21  Closed displaced segmental fracture of shaft of left femur, initial encounter (HC Code) S72.362A  Decreased range of motion (ROM) of left knee M25.662  Decreased strength of lower extremity R29.898  Altered gait R26.9   INFUSION TREATMENT  Sickle cell disease, 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 hydroxyurea 3 tab daily for blood. ? ibuprofen Up to 4 tablets daily as needed for pain. ? naloxone Use 1 spray in 1 nostril for suspected opioid overdose. May repeat in 2 minutes in other nostril with new device if minimal or no response. ? oxyCODONE 1 tablet every 12 hours as needed for persistent pain. D57.2 ? oxyCODONE 1 - 3 tablets every 3 h as needed for pain. ? senna Take 2 tablets (17.2 mg total) by mouth every 3 (three) hours as needed for constipation. SubjectivePt reporting pain as 5/10 in in the thigh and in the knee. Pt reporting good compliance with HEP 2x/day.Pain Rating:  5 / 10 -    Better: ice to knee, get moving, get up   Worse:  When first wake up, painful & gets mm fluttering   Comments: the leg sort of falls alseep[ when I ice it - sitting on my bed with my leg out straightPertinent History of Current Problem: Past Medical HistoryPast Medical History: Diagnosis Date ? 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 Avascular necrosis of femur head, left (HC Code)  ? Chronic pain   sickle cell ? Conductive hearing loss 09/22/14 P.E tubes placed ? Dactylitis one episode  April, 1998 ? GERD (gastroesophageal reflux disease)  ? 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 On hydroxyurea therapy started October 2013 ? Pneumococcal vaccination given 01/20/2006 and 09/28/2012 ? 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 CHOLECYSTECTOMY, LAPAROSCOPIC  1/15 ? TYMPANOSTOMY TUBE PLACEMENT  09/22/2014  by Dr. Bernita Raisin AllergiesPatient has no known allergies.Social / Emotional Information: active young manPrior Level of Function:  Unrestricted but deals with chronic pain issues    Change in Status from prior level of function?  yesObjectiveAmbulation/Gait Assessment:  Allowed WBAT - tends to minimally wt bear with (B) axillary crutches    Orthopedic Tools/Scales/Outcome MeasuresOrtho/Lumbar/Spine ToolsTreatment Provided This VisitCPT Code Interventions Timed Minutes Untimed Minutes Total  Minutes N/A Therapeutic Exercise (97110) Stationary Bike Seat #14 L0 5 minQS seated, supine; glute sets 2X10  prone or supineHeel slides supine, seatedProne leg curls alt L/R Seated knee lifts/marchesStanding at staircase - use (R) crutch and (L) railing for step-to's with equal timingSLR 8x with 5 second holdGastroc and soleus stretching 30 sec x 3Supine hamstring stretch 30 sec x 3 28  28  Gait Training ([address]) Gait with (B) axillary crutches with cues for heel toe pattern, two point gait and posture correction;  stairs one at a time - pt demonstrated safety using 2 crutches or crutch plus rialing; -advised not to try to climb stairs reciprocally yet  12  12 Heat - Ice Pack  Pt deferred today      Total Treatment Time: 40 Access Code: [email or web address]: [email or web address]: 07/23/2021Prepared by: Wess Botts ZAZULAKExercises?Supine Hamstring Stretch with Strap - 1 x daily - 7 x weekly - 1 sets - 3 reps - 30 hold?Gastroc Stretch on Wall - 1 x daily - 7 x weekly - 1 sets - 3 reps - 30 hold?Gastroc Stretch on Wall - 1 x daily - 7 x weekly - 1 sets - 3 reps - 30 hold?Small Range Straight Leg Raise - 1 x daily - 7 x weekly - 3 sets - 10 reps - 5 holdAccess Code: [email or web address]: [email or web address]: 07/19/2021Prepared by: DEBRA SPROSSExercisesLong Sitting Quad Set - 5 x daily - 7 x weekly - 2 sets - 10 repsSupine Active Straight Leg Raise - 1 x daily - 7 x weekly - 10 reps - 3 setsSupine Gluteal Sets - 1 x daily - 7 x weekly - 3 sets - 10 repsSupine Heel Slide - 1 x daily - 7 x weekly - 10 reps - 3 setsProne Knee Flexion - 1 x daily - 7 x weekly - 3 sets - 10 repsSeated March - 1 x daily - 7 x weekly - 3 sets - 5 repsStanding Hip Abduction with Anterior Support - 1 x daily - 7 x weekly - 10 reps - 3 setsSeated Gastroc Stretch with Strap - 2 x daily - 7 x weekly - 1 sets - 3 repsStep Taps on High Step - 1 x daily - 7 x weekly - 3 sets - 10 repsProblem ListDeviated gaitDecreased ROMDecreased strengthWarmth and swelling (L) knee and hipMod pain in hip & kneeFunctional deficits including disturbed sleep, difficulty with car and bed transfers, ambulation in community, etc per FOMAssessmentMitchel tolerated rx well and is demonstrating good understanding/ compliance with there ex, improved gait with increased WB to PWB (pt allowed WBAT). Pt demonstrating improved VMO/quad control as he is able to perform independent SLR.Patient / Family / Caregiver EducationDiscussed role of therapyDiscussed the value of collaboration with other providersDiscussed the presenting problemReviewed the assessmentDiscussed plan of care and rationalePatient/Family/Caregiver demonstrate agreement with the planWritten materials / instruction provided - Rehab PotentialGoodPlanTherapeutic Exercise (97110)Manual Therapy (97140)Therapeutic Activity (97530)Self-Care/Home Management (97535)Gait Training (97116)Neuromuscular Re-Education (97112)Heat - Ice Pack Frequency:  2x per week live and/or virtual visitsDuration:  12 weeksPlan for Next VisitProgress strengthening and ROM, gait trainingRecommendationsAdvised pt to schedule at which ever site he will be able to get to consistentlyGoalsShort Term Goals  (4-6 weeks):  1. Reduce knee/leg pain to 0-4/10 without need for narcotic meds beyond his baseline2. Able to resume community ambulaltion without crutches demonstrating good quad control3. Demonstrates full AROM (L)knee with good NM control of ant/post/med/lat muscle groups4. SLR without lag to improve push-off in gait cycle5. Able to get in/out of car and bed with easeLong Term Goals (  12 weeks): 1. Able to squat and lift 50 lb with symmetrical movement without pain X502. Able to perform 30 step-ups with full TKE w/o valgus deformity3. Demonstrates good flexibility of LE's4. indep prophylactic HEP5. Demonstrates good gait pattern without compensations all surfaces

## 2020-01-27 ENCOUNTER — Inpatient Hospital Stay
Admit: 2020-01-27 | Discharge: 2020-01-27 | Payer: BLUE CROSS/BLUE SHIELD | Primary: Student in an Organized Health Care Education/Training Program

## 2020-01-27 DIAGNOSIS — M25662 Stiffness of left knee, not elsewhere classified: Secondary | ICD-10-CM

## 2020-01-27 DIAGNOSIS — R269 Unspecified abnormalities of gait and mobility: Secondary | ICD-10-CM

## 2020-01-27 DIAGNOSIS — R29898 Other symptoms and signs involving the musculoskeletal system: Secondary | ICD-10-CM

## 2020-01-27 NOTE — Other
REHABILITATION SERVICES AT [facility] Services At [facility] [place] [address] Number: [phone] Number: [phone] Therapy Orthopedic Daily Note7/26/2021Patient Name:  Phillip RiceMedical Record Number:  [id number] Date of Birth:  12-20-1997Therapist:  Blythe Stanford, DPTReferring Provider:  Luis Abed, MDICD-10 Diagnosis(es):Problem List         ICD-10-CM   PT - S/P ORIF (L) femur fx - July '21  Closed displaced segmental fracture of shaft of left femur, initial encounter (HC Code) S72.362A  Decreased range of motion (ROM) of left knee M25.662  Decreased strength of lower extremity R29.898  Altered gait R26.9   INFUSION TREATMENT  Sickle cell disease, 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 hydroxyurea 3 tab daily for blood. ? ibuprofen Up to 4 tablets daily as needed for pain. ? naloxone Use 1 spray in 1 nostril for suspected opioid overdose. May repeat in 2 minutes in other nostril with new device if minimal or no response. ? oxyCODONE 1 tablet every 12 hours as needed for persistent pain. D57.2 ? oxyCODONE 1 - 3 tablets every 3 h as needed for pain. ? senna Take 2 tablets (17.2 mg total) by mouth every 3 (three) hours as needed for constipation. SubjectivePt reports he has been trying to do some more weight shifting. Finding a bit more discomfort toward the hip today than the knee, and his knee swelling has really improved.Pertinent History of Current Problem: Past Medical HistoryPast Medical History: Diagnosis Date ? 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 Avascular necrosis of femur head, left (HC Code)  ? Chronic pain   sickle cell ? Conductive hearing loss 09/22/14 P.E tubes placed ? Dactylitis one episode  April, 1998 ? GERD (gastroesophageal reflux disease)  ? 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 On hydroxyurea therapy started October 2013 ? Pneumococcal vaccination given 01/20/2006 and 09/28/2012 ? 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 CHOLECYSTECTOMY, LAPAROSCOPIC  1/15 ? TYMPANOSTOMY TUBE PLACEMENT  09/22/2014  by Dr. Bernita Raisin AllergiesPatient has no known allergies.Social / Emotional Information: active young manPrior Level of Function:  Unrestricted but deals with chronic pain issues    Change in Status from prior level of function?  yesObjectiveAmbulation/Gait Assessment:  Allowed WBAT - tends to minimally wt bear with (B) axillary crutches    Treatment Provided This VisitCPT Code Interventions Timed Minutes Untimed Minutes Total Minutes N/A      Therapeutic Exercise (97110) - BioDex weight bearing, maintaining neutral, x69mins- BioDex weight bearing, eyes closed R shift, return to neutral, x10- BioDex weight bearing, minisquat, x12- TKE, green theraband, x15 L- R SLS, opposite hip flex/abd/ext, green theraband at  knees, x10e- Total Gym squat, LVL12, x15- Total Gym alt march, LVL12, x15  30  30 N/A     N/A       Total Treatment Time: 30 AssessmentEncouraging increased weight shifting and single limb weight bearing with Total Gym march with modified weight demand through L LE, requiring adequate knee control through modified single leg stance.PlanFrequency:  2x per week live and/or virtual visitsDuration:  12 weeksPlan for Next VisitProgress strengthening and ROM, gait training

## 2020-01-28 ENCOUNTER — Ambulatory Visit
Admit: 2020-01-28 | Payer: PRIVATE HEALTH INSURANCE | Primary: Student in an Organized Health Care Education/Training Program

## 2020-01-30 ENCOUNTER — Encounter
Admit: 2020-01-30 | Payer: PRIVATE HEALTH INSURANCE | Attending: Hematology & Oncology | Primary: Student in an Organized Health Care Education/Training Program

## 2020-01-30 DIAGNOSIS — D571 Sickle-cell disease without crisis: Secondary | ICD-10-CM

## 2020-01-31 ENCOUNTER — Inpatient Hospital Stay
Admit: 2020-01-31 | Discharge: 2020-01-31 | Payer: BLUE CROSS/BLUE SHIELD | Primary: Student in an Organized Health Care Education/Training Program

## 2020-01-31 DIAGNOSIS — R269 Unspecified abnormalities of gait and mobility: Secondary | ICD-10-CM

## 2020-01-31 DIAGNOSIS — S72362A Displaced segmental fracture of shaft of left femur, initial encounter for closed fracture: Secondary | ICD-10-CM

## 2020-01-31 DIAGNOSIS — R29898 Other symptoms and signs involving the musculoskeletal system: Secondary | ICD-10-CM

## 2020-01-31 DIAGNOSIS — M25662 Stiffness of left knee, not elsewhere classified: Secondary | ICD-10-CM

## 2020-01-31 NOTE — Other
REHABILITATION SERVICES AT [facility] Services At [facility] [address] Number: [phone] Number: [phone] Therapy Daily Note7/30/2021Patient Name:  Phillip RiceMedical Record Number:  [id number] Date of Birth:  1997/09/28Therapist:  Blythe Stanford, DPTReferring Provider:  Luis Abed, MDICD-10 Diagnosis(es):Problem List         ICD-10-CM   PT - S/P ORIF (L) femur fx - July '21  Closed displaced segmental fracture of shaft of left femur, initial encounter (HC Code) S72.362A  Decreased range of motion (ROM) of left knee M25.662  Decreased strength of lower extremity R29.898  Altered gait R26.9   INFUSION TREATMENT  Sickle cell disease, 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 hydroxyurea 3 tab daily for blood. ? ibuprofen Up to 4 tablets daily as needed for pain. ? naloxone Use 1 spray in 1 nostril for suspected opioid overdose. May repeat in 2 minutes in other nostril with new device if minimal or no response. ? oxyCODONE 1 tablet every 12 hours as needed for persistent pain. D57.2 ? oxyCODONE 1 - 3 tablets every 3 h as needed for pain. ? senna Take 2 tablets (17.2 mg total) by mouth every 3 (three) hours as needed for constipation. SubjectivePt reports no new complaints. Typical pains when trying to increase the weight on the leg.ObjectiveTreatment Provided This VisitCPT Code Interventions Timed Minutes Untimed Minutes Total Minutes N/A      Therapeutic Exercise (97110) - TKE retro step, red tubing, x12 L- Reverse TKE, red tubing, x12 L- Tilt board taps, L/R, x10- Tilt board taps in stride, A/P, x10 B- Pallof press in stride, red tubing, x8e B- Sit to stand, 1 LE elevated on foam, 20inch, x8 B- Reverse lunge slide, x10 R- Modified L half kneeling on 16inch step plus foam, 3sx10- Total Gym squat, LVL17, x15 30  30 N/A     N/A       Total Treatment Time: 30 AssessmentIncreasing L LE loading through half kneeling WB, tilt board weight shifting, and pallof press to emphasize weight distribution with good tolerance.PlanFrequency:  2x per weekDuration:  12 weeksPlan for Next VisitProgress strengthening and ROM, gait training

## 2020-02-01 DIAGNOSIS — S72362A Displaced segmental fracture of shaft of left femur, initial encounter for closed fracture: Secondary | ICD-10-CM

## 2020-02-01 DIAGNOSIS — M25662 Stiffness of left knee, not elsewhere classified: Secondary | ICD-10-CM

## 2020-02-05 ENCOUNTER — Inpatient Hospital Stay
Admit: 2020-02-05 | Discharge: 2020-02-05 | Payer: BLUE CROSS/BLUE SHIELD | Primary: Student in an Organized Health Care Education/Training Program

## 2020-02-05 DIAGNOSIS — R269 Unspecified abnormalities of gait and mobility: Secondary | ICD-10-CM

## 2020-02-05 DIAGNOSIS — M25662 Stiffness of left knee, not elsewhere classified: Secondary | ICD-10-CM

## 2020-02-05 DIAGNOSIS — S72362A Displaced segmental fracture of shaft of left femur, initial encounter for closed fracture: Secondary | ICD-10-CM

## 2020-02-06 NOTE — Other
REHABILITATION SERVICES AT [facility] Services At [place] [id number] [place] [address] Number: [phone] Number: [phone] Therapy Daily Note8/4/2021Patient Name:  Phillip RiceMedical Record Number:  [id number] Date of Birth:  1997/06/18Therapist:  Blythe Stanford, DPTReferring Provider:  Luis Abed, MDICD-10 Diagnosis(es):Problem List         ICD-10-CM   PT - S/P ORIF (L) femur fx - July '21  Closed displaced segmental fracture of shaft of left femur, initial encounter (HC Code) S72.362A  Decreased range of motion (ROM) of left knee M25.662  Decreased strength of lower extremity R29.898  Altered gait R26.9   INFUSION TREATMENT  Sickle cell disease, 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 hydroxyurea 3 tab daily for blood. ? ibuprofen Up to 4 tablets daily as needed for pain. ? naloxone Use 1 spray in 1 nostril for suspected opioid overdose. May repeat in 2 minutes in other nostril with new device if minimal or no response. ? oxyCODONE 1 tablet every 12 hours as needed for persistent pain. D57.2 ? oxyCODONE 1 - 3 tablets every 3 h as needed for pain. ? senna Take 2 tablets (17.2 mg total) by mouth every 3 (three) hours as needed for constipation. SubjectiveTrying to put more weight down on my leg. Pt reports no new complaints. Typical pains when trying to increase the weight on the leg.ObjectiveTreatment Provided This VisitCPT Code Interventions Timed Minutes Untimed Minutes Total Minutes N/A      Therapeutic Exercise (97110) - Bike recumbent x 5 min Total Gym squat, LVL23 2 x10 (B)Lvl 11 Single leg (L) 2 x10Sit back 2 x10TKE retro step, red tband  2x10Alternating step lunge with mrror feedbackHip abduction standing 2 x10Not today:- Reverse TKE, red tubing, x12 L- Tilt board taps, L/R, x10- Tilt board taps in stride, A/P, x10 B- Pallof press in stride, red tubing, x8e B- Sit to stand, 1 LE elevated on foam, 20inch, x8 B- Reverse lunge slide, x10 R- Modified L half kneeling on 16inch step plus foam, 3sx10-  30  30 N/A Gait training with single crutch on right 8  8 N/A       Total Treatment Time: 38 AssessmentImproper footwear for session today.  Instructed to wear sneakers for next visit.  Good tolerance to alternating lunge with mirror for feedback.  PlanFrequency:  2x per weekDuration:  12 weeksPlan for Next VisitProgress strengthening and ROM, gait trainingJoseph Virdie Penning, PT

## 2020-02-07 ENCOUNTER — Inpatient Hospital Stay
Admit: 2020-02-07 | Discharge: 2020-02-07 | Payer: BLUE CROSS/BLUE SHIELD | Primary: Student in an Organized Health Care Education/Training Program

## 2020-02-07 DIAGNOSIS — S72362A Displaced segmental fracture of shaft of left femur, initial encounter for closed fracture: Secondary | ICD-10-CM

## 2020-02-10 NOTE — Other
REHABILITATION SERVICES AT [facility] Services At [place] [phone] [address] [place] [address] Number: [phone] Number: [phone] Therapy Daily Note8/6/2021Patient Name:  Phillip RiceMedical Record Number:  [id number] Date of Birth:  October 27, 1997Therapist:  Blythe Stanford, DPTReferring Provider:  Luis Abed, MDICD-10 Diagnosis(es):Problem List         ICD-10-CM   PT - S/P ORIF (L) femur fx - July '21  Closed displaced segmental fracture of shaft of left femur, initial encounter (HC Code) S72.362A  Decreased range of motion (ROM) of left knee M25.662  Decreased strength of lower extremity R29.898  Altered gait R26.9   INFUSION TREATMENT  Sickle cell disease, 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 hydroxyurea 3 tab daily for blood. ? ibuprofen Up to 4 tablets daily as needed for pain. ? naloxone Use 1 spray in 1 nostril for suspected opioid overdose. May repeat in 2 minutes in other nostril with new device if minimal or no response. ? oxyCODONE 1 tablet every 12 hours as needed for persistent pain. D57.2 ? oxyCODONE 1 - 3 tablets every 3 h as needed for pain. ? senna Take 2 tablets (17.2 mg total) by mouth every 3 (three) hours as needed for constipation. SubjectiveFeels like something catches when I lift my left leg up.     Pt reports no new complaints. Typical pains when trying to increase the weight on the leg.ObjectiveTreatment Provided This VisitCPT Code Interventions Timed Minutes Untimed Minutes Total Minutes N/A      Therapeutic Exercise (97110) - Bike recumbent [place] lvl 4 x 8 min Total Gym squat, LVL23 x10 (B)Lvl 21 Single leg (L) 2 x10Standing TKE with theraband (green) 2 x10Heel raise 2 x10Step up 4 with contralateral hip flexion 2 x10Sit back 2 x10Alternating step lunge with mrror feedbackHip abduction standing 2 x10Not today:- Reverse TKE, red tubing, x12 L- Tilt board taps, L/R, x10- Tilt board taps in stride, A/P, x10 B- Pallof press in stride, red tubing, x8e B- Sit to stand, 1 LE elevated on foam, 20inch, x8 B- Reverse lunge slide, x10 R-  30  30 N/A     N/A       Total Treatment Time: 30 AssessmentImproved performance with fatigue to LLE at end of session.  PlanFrequency:  2x per weekDuration:  12 weeksPlan for Next VisitProgress strengthening and ROM, gait trainingJoseph Syreeta Figler, PT

## 2020-02-11 ENCOUNTER — Inpatient Hospital Stay
Admit: 2020-02-11 | Discharge: 2020-02-11 | Payer: BLUE CROSS/BLUE SHIELD | Primary: Student in an Organized Health Care Education/Training Program

## 2020-02-11 ENCOUNTER — Encounter
Admit: 2020-02-11 | Payer: BLUE CROSS/BLUE SHIELD | Attending: Orthopaedic Trauma | Primary: Student in an Organized Health Care Education/Training Program

## 2020-02-11 DIAGNOSIS — S72362A Displaced segmental fracture of shaft of left femur, initial encounter for closed fracture: Secondary | ICD-10-CM

## 2020-02-11 NOTE — Progress Notes
Review of Systems Constitutional: Negative.  HENT: Negative.  Eyes: Negative.  Respiratory: Negative.  Cardiovascular: Negative.  Gastrointestinal: Negative.  Endocrine: Negative.  Genitourinary: Negative.  Musculoskeletal:      Left femur injury F/U  Skin: Negative.  Allergic/Immunologic: Negative.  Neurological: Negative.  Hematological: Negative.  Psychiatric/Behavioral: Negative.

## 2020-02-11 NOTE — Progress Notes
Six weeks status post intramedullary nail left femur.  Patient is still utilizing 1 crutch.  He states that his left thigh pain is improving but he still feels that it occasionally may buckle.Patient has full range of motion in his left knee.  He has no obvious signs of skin changes areas of drainage or erythema on his thigh.  He has some soreness with palpation over this region.  His alignment is grossly symmetric compared to the contralateral sideRadiographs were obtained and independently interpreted of left femur which demonstrate intramedullary nail in place with a paucity of bone healing.Six weeks status post intramedullary nail left femur.  I told the patient to perform activities as tolerated weight-bearing as tolerated and hopefully over the next 2 months we will see healing fracture callus.  I would like to see him back in 2 months with repeat clinical radiographic evaluation he will require two views of his left femur at the time of his next visit

## 2020-02-13 ENCOUNTER — Inpatient Hospital Stay
Admit: 2020-02-13 | Discharge: 2020-02-13 | Payer: BLUE CROSS/BLUE SHIELD | Primary: Student in an Organized Health Care Education/Training Program

## 2020-02-13 ENCOUNTER — Ambulatory Visit
Admit: 2020-02-13 | Payer: BLUE CROSS/BLUE SHIELD | Attending: Family | Primary: Student in an Organized Health Care Education/Training Program

## 2020-02-13 DIAGNOSIS — Z23 Encounter for immunization: Secondary | ICD-10-CM

## 2020-02-13 DIAGNOSIS — D571 Sickle-cell disease without crisis: Secondary | ICD-10-CM

## 2020-02-13 LAB — BASIC METABOLIC PANEL
BKR ANION GAP: 9 (ref 7–17)
BKR BLOOD UREA NITROGEN: 13 mg/dL (ref 6–20)
BKR BUN / CREAT RATIO: 16.7 (ref 8.0–23.0)
BKR CALCIUM: 9.2 mg/dL (ref 8.8–10.2)
BKR CHLORIDE: 103 mmol/L (ref 98–107)
BKR CO2: 26 mmol/L (ref 20–30)
BKR CREATININE: 0.78 mg/dL (ref 0.40–1.30)
BKR EGFR (AFR AMER): >60 mL/min/{1.73_m2} (ref >60)
BKR EGFR (NON AFRICAN AMERICAN): >60 mL/min/{1.73_m2} (ref >60)
BKR GLUCOSE: 103 mg/dL — ABNORMAL HIGH (ref 70–100)
BKR POTASSIUM: 4.4 mmol/L (ref 3.3–5.1)
BKR SODIUM: 138 mmol/L (ref 136–144)

## 2020-02-13 LAB — RETICULOCYTES
BKR WAM IRF: 24.4 % — ABNORMAL HIGH (ref 3.0–15.9)
BKR WAM RETICULOCYTE - ABS: 0.1208 10Ë6 cells/uL (ref 0.0230–0.1400)
BKR WAM RETICULOCYTE COUNT PCT: 5.12 % — ABNORMAL HIGH (ref 0.60–2.70)
BKR WAM RETICULOCYTE HGB EQUIVALENT: 38.3 pg

## 2020-02-13 LAB — CBC WITHOUT DIFFERENTIAL
BKR GLOBULIN: 101.3 fL — ABNORMAL HIGH (ref 78.0–94.0)
BKR WAM ANALYZER ANC: 3.6 x 1000/??L (ref 1.0–11.0)
BKR WAM HEMATOCRIT: 23.9 % — ABNORMAL LOW (ref 37.0–52.0)
BKR WAM HEMOGLOBIN: 8.6 g/dL — ABNORMAL LOW (ref 12.0–18.0)
BKR WAM MCH (PG): 36.4 pg — ABNORMAL HIGH (ref 27.0–31.0)
BKR WAM MCHC: 36 g/dL (ref 31.0–36.0)
BKR WAM MCV: 101.3 fL — ABNORMAL HIGH (ref 78.0–94.0)
BKR WAM MPV: 8.8 fL (ref 6.0–11.0)
BKR WAM PLATELETS: 476 x1000/??L — ABNORMAL HIGH (ref 140–440)
BKR WAM RDW-CV: 18.1 % — ABNORMAL HIGH (ref 11.5–14.5)
BKR WAM RED BLOOD CELL COUNT: 2.4 M/ÂµL — ABNORMAL LOW (ref 3.8–5.9)
BKR WAM WHITE BLOOD CELL COUNT: 6.7 x1000/??L — ABNORMAL HIGH (ref 4.0–10.0)

## 2020-02-13 LAB — HEPATIC FUNCTION PANEL
BKR A/G RATIO: 1.7 (ref 1.0–2.2)
BKR ALANINE AMINOTRANSFERASE (ALT): 19 U/L (ref 9–59)
BKR ALBUMIN: 4.5 g/dL (ref 3.6–4.9)
BKR ALKALINE PHOSPHATASE: 82 U/L — ABNORMAL HIGH (ref 9–122)
BKR ASPARTATE AMINOTRANSFERASE (AST): 35 U/L (ref 10–35)
BKR AST/ALT RATIO: 1.8 M/??L — ABNORMAL LOW (ref 3.8–5.9)
BKR BILIRUBIN DIRECT: 0.3 mg/dL (ref <=0.3)
BKR BILIRUBIN TOTAL: 1.8 mg/dL — ABNORMAL HIGH (ref <=1.2)
BKR HEP B SURFACE AB INITIAL RESULT: 7.2 g/dL — ABNORMAL LOW (ref 6.6–8.7)
BKR PROTEIN TOTAL: 7.2 g/dL (ref 6.6–8.7)

## 2020-02-13 MED ORDER — IBUPROFEN 600 MG TABLET
600 mg | ORAL_TABLET | 2 refills | Status: AC
Start: 2020-02-13 — End: 2020-05-06

## 2020-02-13 MED ORDER — MENINGOCOCCAL B VAC,4-CMP 50 MCG-50 MCG-50 MCG-25 MCG/0.5ML IM SYRINGE
50-50-50-25 mcg/0.5 mL | Freq: Once | INTRAMUSCULAR | Status: CP
Start: 2020-02-13 — End: ?
  Administered 2020-02-13: 19:00:00 50-50-50-25 mL via INTRAMUSCULAR

## 2020-02-13 NOTE — Progress Notes
Pt here for vaccine. Clarified w/ Nadene Rubins, APRN, that patient has had three Hep B vaccines. Titer drawn today, along w/ other due labs. If low, might get another booster when he returns to see Dr Su Hilt in a month (for his 8 wk visit). Confirmed today he is due for 2nd Bexsera dose. Administered Bexsera IM in R deltoid. Site benign. An After Visit Summary was printed and given to the patient.Electronically Signed by Daiva Nakayama, RN, February 13, 2020

## 2020-02-13 NOTE — Progress Notes
Patient missed his scheduled follow up appointment today. Nadene Rubins, APRN

## 2020-02-13 NOTE — Patient Instructions
Meningococcal Group B Vaccine (4 strain) suspension for injectionWhat is this medicine?MENINGOCOCCAL GROUP B VACCINE, RECOMBINANT (muh ning goh KOK kal vak SEEN) is a vaccine to protect from bacterial meningitis. This vaccine does not contain live bacteria. It will not cause a meningitis.This medicine may be used for other purposes; ask your health care provider or pharmacist if you have questions.COMMON BRAND NAME(S): TRUMENBAWhat should I tell my health care provider before I take this medicine?They need to know if you have any of these conditions:?	bleeding disorder?	fever or infection?	immune system problems?	an unusual or allergic reaction to meningococcal vaccine, other medicines, foods, dyes, or preservatives?	pregnant or trying to get pregnant?	breast-feedingHow should I use this medicine?This medicine is for injection into a muscle. It is given by a health care professional in a hospital or clinic setting.A copy of Vaccine Information Statements will be given before each vaccination. Read this sheet carefully each time. The sheet may change frequently.Talk to your pediatrician regarding the use of this medicine in children. While this drug may be prescribed for children as young as 81 years of age for selected conditions, precautions do apply.Overdosage: If you think you have taken too much of this medicine contact a poison control center or emergency room at once.NOTE: This medicine is only for you. Do not share this medicine with others.What if I miss a dose?It is important not to miss your dose. Call your doctor or health care professional if you are unable to keep an appointment.What may interact with this medicine??	certain medicines that treat or prevent blood clots?	medicines that lower your chance of fighting infection?	other vaccinesThis list may not describe all possible interactions. Give your health care provider a list of all the medicines, herbs, non-prescription drugs, or dietary supplements you use. Also tell them if you smoke, drink alcohol, or use illegal drugs. Some items may interact with your medicine.What should I watch for while using this medicine?Report any side effects that are worrisome to your doctor right away.This vaccine may not protect from all meningitis infections.What side effects may I notice from receiving this medicine?Side effects that you should report to your doctor or health care professional as soon as possible:?	allergic reactions like skin rash, itching or hives, swelling of the face, lips, or tongue?	breathing problemsSide effects that usually do not require medical attention (report to your doctor or health care professional if they continue or are bothersome):?	chills?	diarrhea?	fever?	headache?	joint pain?	muscle pain?	pain, redness, or irritation at site where injectedThis list may not describe all possible side effects. Call your doctor for medical advice about side effects. You may report side effects to FDA at 1-800-FDA-1088.Where should I keep my medicine?This vaccine is given in a hospital or clinic and will not be stored at home.NOTE: This sheet is a summary. It may not cover all possible information. If you have questions about this medicine, talk to your doctor, pharmacist, or health care provider.? 2021 Elsevier/Gold Standard (2018-05-07 10:25:45)

## 2020-02-14 ENCOUNTER — Telehealth
Admit: 2020-02-14 | Payer: PRIVATE HEALTH INSURANCE | Attending: Family | Primary: Student in an Organized Health Care Education/Training Program

## 2020-02-14 ENCOUNTER — Inpatient Hospital Stay
Admit: 2020-02-14 | Discharge: 2020-02-14 | Payer: BLUE CROSS/BLUE SHIELD | Primary: Student in an Organized Health Care Education/Training Program

## 2020-02-14 DIAGNOSIS — R29898 Other symptoms and signs involving the musculoskeletal system: Secondary | ICD-10-CM

## 2020-02-14 DIAGNOSIS — S72362A Displaced segmental fracture of shaft of left femur, initial encounter for closed fracture: Secondary | ICD-10-CM

## 2020-02-14 DIAGNOSIS — R269 Unspecified abnormalities of gait and mobility: Secondary | ICD-10-CM

## 2020-02-14 DIAGNOSIS — M25662 Stiffness of left knee, not elsewhere classified: Secondary | ICD-10-CM

## 2020-02-14 NOTE — Telephone Encounter
Pt was marked as a No show on 8.12, might need rescheduling.

## 2020-02-21 ENCOUNTER — Telehealth
Admit: 2020-02-21 | Payer: PRIVATE HEALTH INSURANCE | Attending: Hematology & Oncology | Primary: Student in an Organized Health Care Education/Training Program

## 2020-02-21 ENCOUNTER — Inpatient Hospital Stay
Admit: 2020-02-21 | Discharge: 2020-02-21 | Payer: BLUE CROSS/BLUE SHIELD | Primary: Student in an Organized Health Care Education/Training Program

## 2020-02-21 ENCOUNTER — Encounter
Admit: 2020-02-21 | Payer: PRIVATE HEALTH INSURANCE | Attending: Family | Primary: Student in an Organized Health Care Education/Training Program

## 2020-02-21 DIAGNOSIS — S72362A Displaced segmental fracture of shaft of left femur, initial encounter for closed fracture: Secondary | ICD-10-CM

## 2020-02-21 DIAGNOSIS — D571 Sickle-cell disease without crisis: Secondary | ICD-10-CM

## 2020-02-21 MED ORDER — OXYCODONE ER 10 MG TABLET,CRUSH RESISTANT,EXTENDED RELEASE 12 HR
10 mg | ORAL_TABLET | 1 refills | Status: AC
Start: 2020-02-21 — End: 2020-03-25

## 2020-02-21 MED ORDER — OXYCODONE IMMEDIATE RELEASE 10 MG TABLET
10 mg | ORAL_TABLET | 1 refills | Status: AC
Start: 2020-02-21 — End: 2020-03-31

## 2020-02-21 NOTE — Other
REHABILITATION SERVICES AT [facility] Services At [place] [phone] [address] [place] [address]J[place] Number: [phone] Number: [phone] Therapy Daily Note8/13/2021Patient Name:  Phillip RiceMedical Record Number:  [id number] Date of Birth:  April 23, 1997Therapist:  Arlyss Queen, DPTReferring Provider:  Luis Abed, MDICD-10 Diagnosis(es):Problem List         ICD-10-CM   PT - S/P ORIF (L) femur fx - July '21  Closed displaced segmental fracture of shaft of left femur, initial encounter (HC Code) S72.362A  Decreased range of motion (ROM) of left knee M25.662  Decreased strength of lower extremity R29.898  Altered gait R26.9   INFUSION TREATMENT  Sickle cell disease, 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 hydroxyurea 3 tab daily for blood. ? ibuprofen Up to 4 tablets daily as needed for pain. ? naloxone Use 1 spray in 1 nostril for suspected opioid overdose. May repeat in 2 minutes in other nostril with new device if minimal or no response. ? oxyCODONE 1 tablet every 12 hours as needed for persistent pain. D57.2 ? oxyCODONE 1 - 3 tablets every 3 h as needed for pain. ? senna Take 2 tablets (17.2 mg total) by mouth every 3 (three) hours as needed for constipation. (Patient not taking: Reported on 02/11/2020) SubjectiveReports feeling primarily hip pain now. ObjectiveTreatment Provided This VisitCPT Code Interventions Timed Minutes Untimed Minutes Total Minutes N/A      Therapeutic Exercise (97110) - Bike recumbent Hills lvl 4 x 8 min Total Gym squat, LVL23 x10 (B)Lvl 21 Single leg (L) 2 x10Standing TKE with theraband (green) 2 x10Heel raise 2 x10Step up 4 with contralateral hip flexion 2 x10 eachSit back 2 x10Alternating step lunge with mrror feedback in II bars 2 x 10 eachHip abduction standing 2 x 10 eachNot today:- Reverse TKE, red tubing, x12 L- Tilt board taps, L/R, x10- Tilt board taps in stride, A/P, x10 B- Pallof press in stride, red tubing, x8e B- Sit to stand, 1 LE elevated on foam, 20inch, x8 B- Reverse lunge slide, x10 R-  40  40 N/A     N/A       Total Treatment Time: 40 AssessmentMr Westberg continues to tolerate progression of his strengthening exercises with min difficulties.  PlanFrequency:  2x per weekDuration:  8-9 weeksPlan for Next VisitProgress report; progress strengthening and ROM, gait trainingElectronically signed by Arlyss Queen, PT 02/14/2020

## 2020-02-21 NOTE — Other
REHABILITATION SERVICES AT [facility] Services At [place] [phone] [address]Ham[address] [place] [address]OZHYQ Number: [phone] Number: [phone] Therapy Daily Note8/20/2021Patient Name:  Phillip RiceMedical Record Number:  [id number] Date of Birth:  1997-07-04Therapist:  Arlyss Queen, PTReferring Provider:  Luis Abed, MDICD-10 Diagnosis(es):Problem List         ICD-10-CM   PT - S/P ORIF (L) femur fx - July '21  Closed displaced segmental fracture of shaft of left femur, initial encounter (HC Code) S72.362A  Decreased range of motion (ROM) of left knee M25.662  Decreased strength of lower extremity R29.898  Altered gait R26.9   INFUSION TREATMENT  Sickle cell disease, 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  NoMedication Review:Current Outpatient Medications Medication Sig ? hydroxyurea 3 tab daily for blood. ? ibuprofen Up to 4 tablets daily as needed for pain. ? naloxone Use 1 spray in 1 nostril for suspected opioid overdose. May repeat in 2 minutes in other nostril with new device if minimal or no response. ? oxyCODONE 1 tablet every 12 hours as needed for persistent pain. D57.2 ? oxyCODONE 1 - 3 tablets every 3 h as needed for pain. ? senna Take 2 tablets (17.2 mg total) by mouth every 3 (three) hours as needed for constipation. (Patient not taking: Reported on 02/11/2020) SubjectiveReports feeling about the same. ObjectiveTreatment Provided This VisitCPT Code Interventions Timed Minutes Untimed Minutes Total Minutes N/A      Therapeutic Exercise (97110) - Bike recumbent Hills lvl 4 x 8 min Total Gym squat, LVL23 x10 (B)Lvl 21 Single leg (L) 2 x10Standing TKE with theraband (green) 2 x10Heel raise 2 x10Step up 6 with contralateral hip flexion 2 x10 eachSit back 2 x10Alternating step lunge with mrror feedback in II bars 2 x 10 eachHip abduction standing 2 x 10 eachNot today:- Reverse TKE, red tubing, x12 L- Tilt board taps, L/R, x10- Tilt board taps in stride, A/P, x10 B- Pallof press in stride, red tubing, x8e B- Sit to stand, 1 LE elevated on foam, 20inch, x8 B- Reverse lunge slide, x10 R-  40  40 N/A     N/A       Total Treatment Time: 40 AssessmentMr Ahn continues to tolerate progression of his strengthening exercises with min difficulties.  PlanFrequency:  2x per weekDuration:  8 weeksPlan for Next VisitProgress report; progress strengthening and ROM, gait trainingElectronically signed by Arlyss Queen, PT 02/21/2020

## 2020-02-21 NOTE — Telephone Encounter
Pt called for refills. Uses, [id number] - [place], [place] - [address]hydroxyrea oxycodoneoxycontin

## 2020-02-24 ENCOUNTER — Encounter
Admit: 2020-02-24 | Payer: PRIVATE HEALTH INSURANCE | Primary: Student in an Organized Health Care Education/Training Program

## 2020-02-25 ENCOUNTER — Inpatient Hospital Stay
Admit: 2020-02-25 | Discharge: 2020-02-25 | Payer: BLUE CROSS/BLUE SHIELD | Primary: Student in an Organized Health Care Education/Training Program

## 2020-02-25 ENCOUNTER — Encounter
Admit: 2020-02-25 | Payer: PRIVATE HEALTH INSURANCE | Attending: Vascular and Interventional Radiology | Primary: Student in an Organized Health Care Education/Training Program

## 2020-02-25 DIAGNOSIS — R269 Unspecified abnormalities of gait and mobility: Secondary | ICD-10-CM

## 2020-02-25 DIAGNOSIS — R29898 Other symptoms and signs involving the musculoskeletal system: Secondary | ICD-10-CM

## 2020-02-25 NOTE — Other
REHABILITATION SERVICES AT [facility] Services At [place] [phone] [address] [place] [address]WJXBJ Number: [phone] Number: [phone] Therapy No Show Note8/24/2021Patient Name:  Phillip RiceMedical Record Number:  [id number] Date of Birth:  05-Jul-1997Therapist:  Arlyss Queen, PTMitchel Bell was a no show for an appointment today.Electronically signed by Arlyss Queen, PT 02/25/2020

## 2020-02-28 ENCOUNTER — Inpatient Hospital Stay
Admit: 2020-02-28 | Discharge: 2020-02-28 | Payer: BLUE CROSS/BLUE SHIELD | Primary: Student in an Organized Health Care Education/Training Program

## 2020-03-01 NOTE — Other
REHABILITATION SERVICES AT [facility] Services At [place] [phone] [address] [place] [address]WJXBJ Number: [phone] Number: [phone] Therapy Plan of Care8/27/2021Patient Name:  Phillip RiceMR#:  [id number] Date of Birth:  [birth year]Referring Provider:  Luis Abed, MDReferring Provider NPI:  [id number] Therapist:  Arlyss Queen, PTProblem List         ICD-10-CM   PT - S/P ORIF (L) femur fx - July '21  Closed displaced segmental fracture of shaft of left femur, initial encounter (HC Code) S72.362A  Decreased range of motion (ROM) of left knee M25.662  Decreased strength of lower extremity R29.898  Altered gait R26.9   INFUSION TREATMENT  Sickle cell disease, 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  This document serves to support medical necessity for continued outpatient Physical Therapy services until 03/26/20 for in-person or telehealth services..Please co-sign this document electronically or return via the fax number listed on the document.Additional Physician Comments:___________________________________     __________________________________Physician Signature                                           Date___________________________________Physician Printed NamePhysical Therapy Orthopedic Progress Note8/27/2021Patient Name:  Phillip RiceMedical Record Number:  [id number] Date of Birth:  [birth year]Therapist:  Arlyss Queen, PTReferring Provider:  Luis Abed, MDICD-10 Diagnosis(es):Problem List         ICD-10-CM   PT - S/P ORIF (L) femur fx - July '21  Closed displaced segmental fracture of shaft of left femur, initial encounter (HC Code) S72.362A  Decreased range of motion (ROM) of left knee M25.662  Decreased strength of lower extremity R29.898 Altered gait R26.9   INFUSION TREATMENT  Sickle cell disease, 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  NoCognition / Learning Assessment   Primary Learner Relationship:  Patient        Barriers to learning:  No barriers        Preferred language:  English        Preferred learning style:  Listening, Demonstration, Reading and Pictures/VideoI reviewed the Patient Care Agreement and Attendance Form with the Patient/Family.  The Patient/Family verbalized understanding.Medication Review:Current Outpatient Medications Medication Sig ? hydroxyurea 3 tab daily for blood. ? ibuprofen Up to 4 tablets daily as needed for pain. ? naloxone Use 1 spray in 1 nostril for suspected opioid overdose. May repeat in 2 minutes in other nostril with new device if minimal or no response. ? oxyCODONE 1 tablet every 12 hours as needed for persistent pain. D57.2 ? oxyCODONE 1 - 3 tablets every 3 h as needed for pain. ? senna Take 2 tablets (17.2 mg total) by mouth every 3 (three) hours as needed for constipation. (Patient not taking: Reported on 02/11/2020) SubjectiveReports he is no longer having (L) knee pain; just (L) hip pain.Pain Rating:  5 /  10 Now: 0 / 10 Best; 6-7 / 10   Comments: (L) Hip painPertinent History of Current Problem: Past Medical HistoryPast Medical History: Diagnosis Date ? 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 Avascular necrosis of femur head, left (HC Code)  ? Chronic pain   sickle cell ? Conductive hearing loss 09/22/14 P.E tubes placed ? Dactylitis one episode  April, 1998 ? GERD (gastroesophageal reflux disease)  ? 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 On hydroxyurea therapy started October 2013 ? Pneumococcal vaccination given 01/20/2006 and 09/28/2012 ? 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 CHOLECYSTECTOMY, LAPAROSCOPIC  1/15 ? TYMPANOSTOMY TUBE PLACEMENT  09/22/2014  by Dr. Bernita Raisin AllergiesPatient has no known allergies.Social / Emotional Information: active young manPrior Level of Function:  Unrestricted but deals with chronic pain issues    Change in Status from prior level of function?  yesObjectivePosture: Sits with poorly maintained lumbar lordosisROM/MMT: ROM          Left          MMT ROM        Right         MMT Hip   Flexion   124 with pain                 4+    WNL                            5 Extension   NT beyond 0                  4    WNL                            5 Abduction   32 min pain                   3-    WNL                            5 Adduction   WNL    WNL                            5 Internal Rotation   35 min pain                   4+    WNL                            5 External Rotation   52                                4+    WNL Knee   Flexion   135 deg pain                 5   135 deg  5 Extension    0 deg           4 avg soreness    0 deg                          5 Ankle/Foot   Dorsiflexion   WNL                            5   WNL                            5 Plantarflexion   WNL                            5    WNL                            5 Inversion   WNL                            5   WNL                            5 Eversion   WNL                            5   WNL                            5 Great Toe Extension   WNL                            5   WNL                            5 Neurologic - Reflexes- N/T this datePalpation/Joint Mobility:Good patellar mobilitySkin/Sensation:Denies sensory changes other than the leg falling asleep with iceSkin intact with surgical scars well healed, min adherent exp proximal scars about lateral left hipFunctional Mobility/Balance:Bed Mobility/Transfers:  IndependentAmbulation/Gait Assessment:  WBAT - independent with unilat axillary crutch    Orthopedic Tools/Scales/Outcome MeasuresPatient Specific Functional Scale (PSFS)   Activity #1 Score:  7      Details:   Up & down 13 steps to his room   Activity #2 Score:  3      Details:   Walk w/o crutches   Activity #3 Score:  8      Details:   Get in/out of car comfortably   TOTAL SCORE:  6   (PSFS Scoring:  0=Extreme difficulty completing task     10=Performs task easily)Lower Extremity Functional Scale      Any of your usual work/household/school activities:  2      Your usual hobbies/recreation/sporting activities:  2      Getting into or out of the bath:  2      Walking between rooms:  3      Putting on your socks:   1      Squatting:  1      Lifting an object from the floor:  1  Performing light activities around your home:  2      Performing heavy activities around your home:  0      Getting into or out of your car:  2      Walking 2 blocks:  1      Walking a mile:  0      Go up or down 10 stairs:  2      Standing for 1 hour:  2 Sitting for 1 hour:  2      Running on even ground:  0      Running on uneven ground:  0      Making sharp turns while running fast:  0      Hopping:  1      Rolling over in bed:  2   Total Score (out of a possible 80):  26   76-78/80 is normal for population   Minimal detectable change = 9 points   Minimal clinically important difference = 9 points   Potential error +/- 5.3 points   Comments:  8 point decrease vs initial assessmentTreatment Provided This VisitCPT Code Interventions Timed Minutes Untimed Minutes Total Minutes Therapeutic Exercise (97110) See Exercise Flow Sheet below.  30  30 Gait Training (40981) Gait with straight cane 8  8 N/A     N/A       Total Treatment Time: 38 Problem ListDeviated gaitDecreased ROMDecreased strengthMod pain in hipFunctional deficits including disturbed sleep, difficulty with car and bed transfers, ambulation in community, etc per [facility] Gariepy reports improved (L) knee pain with his pain now localized to his (L) hip, improved ROM & strength following 4 weeks. Despite these improvements, LE Functional Scale reassessment reveals decreased scores. His prognosis remains good based on findings.Patient / Family / Caregiver EducationReviewed the assessmentDiscussed plan of care and rationalePatient/Family/Caregiver demonstrate agreement with the plan - sent HEP via MyChart messageRehab PotentialGoodPlanTherapeutic Exercise (97110)Manual Therapy (97140)Therapeutic Activity (97530)Self-Care/Home Management (97535)Gait Training (97116)Neuromuscular Re-Education (97112)Heat - Ice Pack Frequency:  2x per week live and/or virtual visitsDuration:  8 weeksPlan for Next VisitProgress strengthening and ROMRecommendationsAdvised pt to schedule at which ever site he will be able to get to consistentlyGoalsShort Term Goals  (4-6 weeks):  1. Reduce knee/leg pain to 0-4/10 without need for narcotic meds beyond his baseline Partially met2. Able to resume community ambulation without crutches demonstrating good quad control Partially met3. Demonstrates full AROM (L)knee with good NM control of ant/post/med/lat muscle groups Met4. SLR without lag to improve push-off in gait cycle Met5. Able to get in/out of car and bed with ease Partially met Long Term Goals (12 weeks): 1. Able to squat and lift 50 lb with symmetrical movement without pain X50 Not met2. Able to perform 30 step-ups with full TKE w/o valgus deformity Not met3. Demonstrates good flexibility of LE's Met4. indep prophylactic HEP Partially met5. Demonstrates good gait pattern without compensations all surfaces Partially metElectronically signed by Arlyss Queen, PT 02/28/2020

## 2020-03-02 ENCOUNTER — Ambulatory Visit
Admit: 2020-03-02 | Payer: PRIVATE HEALTH INSURANCE | Attending: Adult Health | Primary: Student in an Organized Health Care Education/Training Program

## 2020-03-02 DIAGNOSIS — M25662 Stiffness of left knee, not elsewhere classified: Secondary | ICD-10-CM

## 2020-03-02 DIAGNOSIS — R29898 Other symptoms and signs involving the musculoskeletal system: Secondary | ICD-10-CM

## 2020-03-02 DIAGNOSIS — R269 Unspecified abnormalities of gait and mobility: Secondary | ICD-10-CM

## 2020-03-03 DIAGNOSIS — M25662 Stiffness of left knee, not elsewhere classified: Secondary | ICD-10-CM

## 2020-03-03 DIAGNOSIS — S72362A Displaced segmental fracture of shaft of left femur, initial encounter for closed fracture: Secondary | ICD-10-CM

## 2020-03-03 DIAGNOSIS — R29898 Other symptoms and signs involving the musculoskeletal system: Secondary | ICD-10-CM

## 2020-03-03 DIAGNOSIS — R269 Unspecified abnormalities of gait and mobility: Secondary | ICD-10-CM

## 2020-03-10 ENCOUNTER — Inpatient Hospital Stay
Admit: 2020-03-10 | Discharge: 2020-03-10 | Payer: BLUE CROSS/BLUE SHIELD | Primary: Student in an Organized Health Care Education/Training Program

## 2020-03-10 DIAGNOSIS — M25662 Stiffness of left knee, not elsewhere classified: Secondary | ICD-10-CM

## 2020-03-10 DIAGNOSIS — S72362A Displaced segmental fracture of shaft of left femur, initial encounter for closed fracture: Secondary | ICD-10-CM

## 2020-03-10 NOTE — Other
REHABILITATION SERVICES AT [facility] Services At [place] [phone] [address] [place] [address]WJ[place]J Number: [phone] Number: [phone] Therapy Daily Note9/7/2021Patient Name:  Phillip RiceMedical Record Number:  [id number] Date of Birth:  December 17, 1997Therapist:  Arlyss Queen, PTReferring Provider:  Luis Abed, MDICD-10 Diagnosis(es):Problem List         ICD-10-CM   PT - S/P ORIF (L) femur fx - July '21  Closed displaced segmental fracture of shaft of left femur, initial encounter (HC Code) S72.362A  Decreased range of motion (ROM) of left knee M25.662  Decreased strength of lower extremity R29.898  Altered gait R26.9   INFUSION TREATMENT  Sickle cell disease, 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  NoMedication Review:Current Outpatient Medications Medication Sig ? hydroxyurea 3 tab daily for blood. ? ibuprofen Up to 4 tablets daily as needed for pain. ? naloxone Use 1 spray in 1 nostril for suspected opioid overdose. May repeat in 2 minutes in other nostril with new device if minimal or no response. ? oxyCODONE 1 tablet every 12 hours as needed for persistent pain. D57.2 ? oxyCODONE 1 - 3 tablets every 3 h as needed for pain. ? senna Take 2 tablets (17.2 mg total) by mouth every 3 (three) hours as needed for constipation. (Patient not taking: Reported on 02/11/2020) SubjectiveReports feeling about the same; (L) hip more painful/stiff vs knee. ObjectiveTreatment Provided This VisitCPT Code Interventions Timed Minutes Untimed Minutes Total Minutes N/A      Therapeutic Exercise (97110) - Bike recumbent Hills lvl 4 x 10 min Total Gym squat, LVL24 x10 (B)Lvl 24 Single leg (L) 2 x10Standing TKE with theraband (green) 2 x10Heel raise 2 x10Step up 8 with contralateral hip flexion 2 x10 eachSit back 2 x10Alternating step lunge with mrror feedback in II bars 2 x 10 eachHip abduction standing 2 x 10 eachSide stepping in II bars with green T-band 4 x 5 reps eachNot today:- Reverse TKE, red tubing, x12 L- Tilt board taps, L/R, x10- Tilt board taps in stride, A/P, x10 B- Pallof press in stride, red tubing, x8e B- Sit to stand, 1 LE elevated on foam, 20inch, x8 B- Reverse lunge slide, x10 R  40  40 N/A     N/A       Total Treatment Time: 40 AssessmentMr Bazzle continues to tolerate progression of his strengthening exercises with min difficulties.  PlanFrequency:  2x per weekDuration: 7- 8 weeksPlan for Next VisitProgress report; progress strengthening and ROM, gait trainingElectronically signed by Arlyss Queen, PT 03/10/2020

## 2020-03-12 ENCOUNTER — Ambulatory Visit
Admit: 2020-03-12 | Payer: PRIVATE HEALTH INSURANCE | Attending: Family | Primary: Student in an Organized Health Care Education/Training Program

## 2020-03-12 ENCOUNTER — Inpatient Hospital Stay
Admit: 2020-03-12 | Discharge: 2020-03-12 | Payer: BLUE CROSS/BLUE SHIELD | Primary: Student in an Organized Health Care Education/Training Program

## 2020-03-12 ENCOUNTER — Encounter
Admit: 2020-03-12 | Payer: PRIVATE HEALTH INSURANCE | Primary: Student in an Organized Health Care Education/Training Program

## 2020-03-12 DIAGNOSIS — S72362A Displaced segmental fracture of shaft of left femur, initial encounter for closed fracture: Secondary | ICD-10-CM

## 2020-03-12 NOTE — Other
REHABILITATION SERVICES AT [facility] Services At [place] [phone] [address] [place] [address]JYNWG Number: [phone] Number: [phone] Therapy Daily Note9/9/2021Patient Name:  Liston RiceMedical Record Number:  [id number] Date of Birth:  11/19/1997Therapist:  Arlyss Queen, PTReferring Provider:  Luis Abed, MDICD-10 Diagnosis(es):Problem List         ICD-10-CM   PT - S/P ORIF (L) femur fx - July '21  Closed displaced segmental fracture of shaft of left femur, initial encounter (HC Code) S72.362A  Decreased range of motion (ROM) of left knee M25.662  Decreased strength of lower extremity R29.898  Altered gait R26.9   INFUSION TREATMENT  Sickle cell disease, 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  NoMedication Review:Current Outpatient Medications Medication Sig ? hydroxyurea 3 tab daily for blood. ? ibuprofen Up to 4 tablets daily as needed for pain. ? naloxone Use 1 spray in 1 nostril for suspected opioid overdose. May repeat in 2 minutes in other nostril with new device if minimal or no response. ? oxyCODONE 1 tablet every 12 hours as needed for persistent pain. D57.2 ? oxyCODONE 1 - 3 tablets every 3 h as needed for pain. ? senna Take 2 tablets (17.2 mg total) by mouth every 3 (three) hours as needed for constipation. (Patient not taking: Reported on 02/11/2020) SubjectiveReports persistent (L) hip pain & stiffness.ObjectiveTreatment Provided This VisitCPT Code Interventions Timed Minutes Untimed Minutes Total Minutes N/A      Therapeutic Exercise (97110) - Bike recumbent Hills lvl 4 x 10 min Total Gym squat, LVL24 x10 (B)Lvl 24 Single leg (L) 2 x10Standing TKE with theraband (green) 2 x10Heel raise 2 x10Eccentric (L) heel raise 2 x 10Step up 8 with contralateral hip flexion 2 x10 eachSit back 2 x10Alternating step lunge with mrror feedback in II bars 2 x 10 eachHip abduction standing 2 x 10 eachSide stepping in II bars with green T-band 4 x 5 reps eachSide planks on knee (L) hip 2 x 10Not today:- Reverse TKE, red tubing, x12 L- Tilt board taps, L/R, x10- Tilt board taps in stride, A/P, x10 B- Pallof press in stride, red tubing, x8e B- Sit to stand, 1 LE elevated on foam, 20inch, x8 B- Reverse lunge slide, x10 R  45  45 N/A     N/A       Total Treatment Time: 45 AssessmentMr Bajaj continues to tolerate progression of his strengthening exercises with min difficulties.  PlanFrequency:  2x per weekDuration:  7 weeksPlan for Next VisitProgress report; progress strengthening and ROM, gait trainingElectronically signed by Arlyss Queen, PT 03/12/2020

## 2020-03-13 ENCOUNTER — Telehealth
Admit: 2020-03-13 | Payer: PRIVATE HEALTH INSURANCE | Attending: Family | Primary: Student in an Organized Health Care Education/Training Program

## 2020-03-13 NOTE — Telephone Encounter
appt for 9.9 still showing as scheduled, pt might of no showed might need rescheduling.

## 2020-03-13 NOTE — Telephone Encounter
appt r\s pt confirmed

## 2020-03-16 ENCOUNTER — Inpatient Hospital Stay
Admit: 2020-03-16 | Discharge: 2020-03-16 | Payer: BLUE CROSS/BLUE SHIELD | Primary: Student in an Organized Health Care Education/Training Program

## 2020-03-16 DIAGNOSIS — R29898 Other symptoms and signs involving the musculoskeletal system: Secondary | ICD-10-CM

## 2020-03-16 DIAGNOSIS — M25662 Stiffness of left knee, not elsewhere classified: Secondary | ICD-10-CM

## 2020-03-16 NOTE — Other
REHABILITATION SERVICES AT [facility] Services At [place] [phone] [address] [place] [address]NUUVO Number: [phone] Number: [phone] Therapy No Show Note9/13/2021Patient Name:  Phillip RiceMedical Record Number:  [id number] Date of Birth:  1997/10/20Therapist:  Arlyss Queen, PTMitchel Venti was a no show for an appointment today.Electronically signed by Arlyss Queen, PT 03/16/2020

## 2020-03-17 ENCOUNTER — Inpatient Hospital Stay
Admit: 2020-03-17 | Discharge: 2020-03-17 | Payer: BLUE CROSS/BLUE SHIELD | Primary: Student in an Organized Health Care Education/Training Program

## 2020-03-17 ENCOUNTER — Encounter
Admit: 2020-03-17 | Payer: BLUE CROSS/BLUE SHIELD | Attending: Orthopaedic Trauma | Primary: Student in an Organized Health Care Education/Training Program

## 2020-03-17 ENCOUNTER — Encounter
Admit: 2020-03-17 | Payer: PRIVATE HEALTH INSURANCE | Attending: Orthopaedic Trauma | Primary: Student in an Organized Health Care Education/Training Program

## 2020-03-17 DIAGNOSIS — K219 Gastro-esophageal reflux disease without esophagitis: Secondary | ICD-10-CM

## 2020-03-17 DIAGNOSIS — D5700 Hb-SS disease with crisis, unspecified: Secondary | ICD-10-CM

## 2020-03-17 DIAGNOSIS — S72362A Displaced segmental fracture of shaft of left femur, initial encounter for closed fracture: Secondary | ICD-10-CM

## 2020-03-17 DIAGNOSIS — D7389 Other diseases of spleen: Secondary | ICD-10-CM

## 2020-03-17 DIAGNOSIS — M87052 Idiopathic aseptic necrosis of left femur: Secondary | ICD-10-CM

## 2020-03-17 DIAGNOSIS — Z7964 On hydroxyurea therapy: Secondary | ICD-10-CM

## 2020-03-17 DIAGNOSIS — H902 Conductive hearing loss, unspecified: Secondary | ICD-10-CM

## 2020-03-17 DIAGNOSIS — IMO0002 Dactylitis: Secondary | ICD-10-CM

## 2020-03-17 DIAGNOSIS — G8929 Other chronic pain: Secondary | ICD-10-CM

## 2020-03-17 DIAGNOSIS — Z23 Encounter for immunization: Secondary | ICD-10-CM

## 2020-03-17 DIAGNOSIS — D571 Sickle-cell disease without crisis: Secondary | ICD-10-CM

## 2020-03-17 DIAGNOSIS — D6189 Other specified aplastic anemias and other bone marrow failure syndromes: Secondary | ICD-10-CM

## 2020-03-17 MED ORDER — MISCELLANEOUS MEDICAL SUPPLY MISC
1 refills | Status: SS
Start: 2020-03-17 — End: 2020-09-22

## 2020-03-18 ENCOUNTER — Ambulatory Visit
Admit: 2020-03-18 | Payer: BLUE CROSS/BLUE SHIELD | Attending: Family | Primary: Student in an Organized Health Care Education/Training Program

## 2020-03-18 ENCOUNTER — Inpatient Hospital Stay
Admit: 2020-03-18 | Discharge: 2020-03-18 | Payer: BLUE CROSS/BLUE SHIELD | Primary: Student in an Organized Health Care Education/Training Program

## 2020-03-18 ENCOUNTER — Encounter
Admit: 2020-03-18 | Payer: PRIVATE HEALTH INSURANCE | Primary: Student in an Organized Health Care Education/Training Program

## 2020-03-18 DIAGNOSIS — M25662 Stiffness of left knee, not elsewhere classified: Secondary | ICD-10-CM

## 2020-03-18 DIAGNOSIS — R29898 Other symptoms and signs involving the musculoskeletal system: Secondary | ICD-10-CM

## 2020-03-18 NOTE — Progress Notes
SOCIAL WORK NOTEPatient Name: Jonnathan RiceMedical Record Number: [id number] Date of Birth: Jul 28, 1997Social Work Follow Up    Most Recent Value Document Type Progress Note Reason for Encounter Psycho-Social Distress Intervention Distress Assessment & Resolution Distress Assessment & Resolution Clarifying & Identifying Source of Information Patient, Medical Team Medical Team Comment Nadene Rubins, APRN Record Reviewed Yes Level of Care Ambulatory Identified Clinical/Disposition, Issues/Barriers: Follow up to assess needs. Intervention(s)/Summary 20 minutes were spent face to face with [name] during clinic visit with Nadene Rubins, APRN. He verbalized doing alright. Carold informed us that he did not get his medications upon review APRN determined that it requires prior authorization. Danniel continues to recuperate from broken leg using crutches. Carmon has been cleared to return to work. 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 He did not identify any psychosocial concerns. Collaboration with Treatment Team/Community Providers/Family: Nadene Rubins, APRN Outcome Resolved Handoff Required? No Next Steps/Plan (including hand-off): He agreed to call the clinic for support as needed. No other social work interventions at this time. Collaboration with the Sickle cell team. Signature: Alphonzo Dublin, LMSW Contact Information: Mobile [phone]

## 2020-03-18 NOTE — Other
REHABILITATION SERVICES AT [facility] Services At [place] [phone] [address]Ham[address] [place] [address]FAOZH Number: [phone] Number: [phone] Therapy Daily Note9/15/2021Patient Name:  Phillip RiceMedical Record Number:  [id number] Date of Birth:  04/20/1997Therapist:  Arlyss Queen, PTReferring Provider:  Luis Abed, MDICD-10 Diagnosis(es):Problem List         ICD-10-CM   PT - S/P ORIF (L) femur fx - July '21  Closed displaced segmental fracture of shaft of left femur, initial encounter (HC Code) S72.362A  Decreased range of motion (ROM) of left knee M25.662  Decreased strength of lower extremity R29.898  Altered gait R26.9   INFUSION TREATMENT  Sickle cell disease, 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  NoMedication Review:Current Outpatient Medications Medication Sig ? hydroxyurea 3 tab daily for blood. ? ibuprofen Up to 4 tablets daily as needed for pain. ? Miscellaneous Medical Supply Heel lift Left shoeUse as directed. ? naloxone Use 1 spray in 1 nostril for suspected opioid overdose. May repeat in 2 minutes in other nostril with new device if minimal or no response. ? oxyCODONE 1 tablet every 12 hours as needed for persistent pain. D57.2 ? oxyCODONE 1 - 3 tablets every 3 h as needed for pain. ? senna Take 2 tablets (17.2 mg total) by mouth every 3 (three) hours as needed for constipation. (Patient not taking: Reported on 02/11/2020) SubjectiveReports he had a job interview the night of his last visit & states that he called shortly before his scheduled appointment to say that he couldn't make it.  He reports potential conflicts with beginning a new job next week & his ability to attend his future PT visits; he will advise me of his new employer's directive concerning PT.ObjectiveTreatment Provided This VisitCPT Code Interventions Timed Minutes Untimed Minutes Total Minutes N/A      Therapeutic Exercise (97110) - Bike recumbent Hills lvl 4 x 10 min Total Gym squat, LVL24 x10 (B)Lvl 26 Single leg (L) 2 x10Standing TKE with [place] (green) 2 x10Heel raise 2 x10Eccentric (L) heel raise 2 x 10Step up 8 with contralateral hip flexion 2 x10 eachSit back 2 x10Alternating step lunge with mrror feedback in II bars x 10 eachHip abduction standing 2 x 10 eachSide stepping in II bars with green T-band 4 x 5 reps eachSide planks on knee (L) hip deferredNot today:- Reverse TKE, red tubing, x12 L- Tilt board taps, L/R, x10- Tilt board taps in stride, A/P, x10 B- Pallof press in stride, red tubing, x8e B- Sit to stand, 1 LE elevated on foam, 20inch, x8 B- Reverse lunge slide, x10 R  45  45 N/A     N/A       Total Treatment Time: 45 AssessmentMr Phillip Bell continues to tolerate progression of his strengthening exercises with min difficulties.PlanFrequency:  2x per weekDuration:  6-7 weeksPlan for Next VisitProgress report; progress strengthening and ROM, gait trainingElectronically signed by Arlyss Queen, PT 03/18/2020

## 2020-03-19 DIAGNOSIS — D571 Sickle-cell disease without crisis: Secondary | ICD-10-CM

## 2020-03-19 DIAGNOSIS — Z789 Other specified health status: Secondary | ICD-10-CM

## 2020-03-19 NOTE — Progress Notes
DOS: 12/21/19 status post intramedullary nail left femur. Subjective: left thigh pain improving. ambulating with no assistive device. Working on strengthening his legObjective:?Patient has full range of motion in his left knee.  He has no obvious signs of skin changes areas of drainage or erythema on his thigh.  He has some soreness with palpation over this region.  His alignment is grossly symmetric compared to the contralateral side??Radiographs were obtained and independently interpreted of left femur which demonstrate intramedullary nail in place with some bone healing.?3 months status post intramedullary nail left femur.given handicap permit and shoe lift for Left shoe Activities as tolerated no restrictions. Return to clinic in 3 months  For repeat XR L femur in ap and lateral view

## 2020-03-19 NOTE — Progress Notes
Review of Systems Constitutional: Negative.  HENT: Negative.  Eyes: Negative.  Respiratory: Negative.  Cardiovascular: Negative.  Gastrointestinal: Negative.  Endocrine: Negative.  Genitourinary: Negative.  Musculoskeletal:      SURGICAL STABILIZATION OF LEFT FEMUR 12/21/2019 F/U  Skin: Negative.  Allergic/Immunologic: Negative.  Neurological: Negative.  Hematological: Negative.  Psychiatric/Behavioral: Negative.

## 2020-03-20 ENCOUNTER — Encounter
Admit: 2020-03-20 | Payer: PRIVATE HEALTH INSURANCE | Attending: Family | Primary: Student in an Organized Health Care Education/Training Program

## 2020-03-23 ENCOUNTER — Telehealth
Admit: 2020-03-23 | Payer: PRIVATE HEALTH INSURANCE | Attending: Family | Primary: Student in an Organized Health Care Education/Training Program

## 2020-03-23 ENCOUNTER — Inpatient Hospital Stay
Admit: 2020-03-23 | Discharge: 2020-03-23 | Payer: BLUE CROSS/BLUE SHIELD | Primary: Student in an Organized Health Care Education/Training Program

## 2020-03-23 DIAGNOSIS — S72362A Displaced segmental fracture of shaft of left femur, initial encounter for closed fracture: Secondary | ICD-10-CM

## 2020-03-23 DIAGNOSIS — M25662 Stiffness of left knee, not elsewhere classified: Secondary | ICD-10-CM

## 2020-03-23 NOTE — Other
REHABILITATION SERVICES AT [facility] Services At [place] [phone] [address] [place] [address]MWNUU Number: [phone] Number: [phone] Therapy No Show Note9/20/2021Patient Name:  Phillip RiceMedical Record Number:  [id number] Date of Birth:  [birth year]Therapist:  Arlyss Queen, PTMitchel Bell was a no show for an appointment today.Electronically signed by Arlyss Queen, PT 03/23/2020

## 2020-03-23 NOTE — Telephone Encounter
Received staff messgae request for  Prior Auth  on RX:        OxyCONTIN 10MG  er tabletsSpoke with [facility] [place], [phone] obtainedQty: 56/ Day Supply: 28Obtained insurance information:ID# [id number] Phone# [phone] Bin# [id number] PCN: [id number]# [id number] processing on Cover my Meds portal  Key : (Key: [id number])  The plan will fax you a determination, typically within 1 to 5 business days.

## 2020-03-24 ENCOUNTER — Telehealth
Admit: 2020-03-24 | Payer: PRIVATE HEALTH INSURANCE | Attending: Hematology & Oncology | Primary: Student in an Organized Health Care Education/Training Program

## 2020-03-25 ENCOUNTER — Encounter
Admit: 2020-03-25 | Payer: PRIVATE HEALTH INSURANCE | Attending: Family | Primary: Student in an Organized Health Care Education/Training Program

## 2020-03-25 ENCOUNTER — Encounter
Admit: 2020-03-25 | Payer: PRIVATE HEALTH INSURANCE | Attending: Vascular and Interventional Radiology | Primary: Student in an Organized Health Care Education/Training Program

## 2020-03-25 DIAGNOSIS — D571 Sickle-cell disease without crisis: Secondary | ICD-10-CM

## 2020-03-25 MED ORDER — OXYCODONE ER 10 MG TABLET,CRUSH RESISTANT,EXTENDED RELEASE 12 HR
10 mg | ORAL_TABLET | 1 refills | Status: AC
Start: 2020-03-25 — End: 2020-05-01

## 2020-03-26 ENCOUNTER — Inpatient Hospital Stay
Admit: 2020-03-26 | Discharge: 2020-03-26 | Payer: BLUE CROSS/BLUE SHIELD | Primary: Student in an Organized Health Care Education/Training Program

## 2020-03-26 ENCOUNTER — Encounter
Admit: 2020-03-26 | Payer: PRIVATE HEALTH INSURANCE | Attending: Vascular and Interventional Radiology | Primary: Student in an Organized Health Care Education/Training Program

## 2020-03-26 ENCOUNTER — Telehealth
Admit: 2020-03-26 | Payer: PRIVATE HEALTH INSURANCE | Attending: Family | Primary: Student in an Organized Health Care Education/Training Program

## 2020-03-26 DIAGNOSIS — R29898 Other symptoms and signs involving the musculoskeletal system: Secondary | ICD-10-CM

## 2020-03-26 DIAGNOSIS — M25662 Stiffness of left knee, not elsewhere classified: Secondary | ICD-10-CM

## 2020-03-26 DIAGNOSIS — S72362A Displaced segmental fracture of shaft of left femur, initial encounter for closed fracture: Secondary | ICD-10-CM

## 2020-03-26 NOTE — Telephone Encounter
Lily from [facility] calling to let us know that medication oxycodone has been denied and can no longer be reviewed. If the physician chooses to appeal the denial they would have to fill out the form that she will fax over with the information. The appeal must state that the Md is appealing on behalf of the patient.

## 2020-03-26 NOTE — Other
REHABILITATION SERVICES AT [facility] Services At [place] [phone] [address]Ham[address] [place] [address]WJXBJ Number: [phone] Number: [phone] Therapy Cancel Note9/23/2021Patient Name:  Phillip RiceMedical Record Number:  [id number] Date of Birth:  Sep 01, 1997Therapist:  Arlyss Queen, PTMitchel Marquart cancelled today's appointment via automated appointment reminder system; no reason could be given.Electronically signed by Arlyss Queen, PT 03/26/2020

## 2020-03-26 NOTE — Telephone Encounter
recvd [facility] MASS  DENIAL    staff message Nadene Rubins

## 2020-03-30 ENCOUNTER — Inpatient Hospital Stay
Admit: 2020-03-30 | Discharge: 2020-03-30 | Payer: BLUE CROSS/BLUE SHIELD | Primary: Student in an Organized Health Care Education/Training Program

## 2020-03-30 DIAGNOSIS — S72362A Displaced segmental fracture of shaft of left femur, initial encounter for closed fracture: Secondary | ICD-10-CM

## 2020-03-30 DIAGNOSIS — M25662 Stiffness of left knee, not elsewhere classified: Secondary | ICD-10-CM

## 2020-03-30 DIAGNOSIS — R29898 Other symptoms and signs involving the musculoskeletal system: Secondary | ICD-10-CM

## 2020-03-30 NOTE — Other
REHABILITATION SERVICES AT [facility] Services At [place] [phone] [address] [place] [address]WJXBJ Number: [phone] Number: [phone] Therapy No Show Note9/27/2021Patient Name:  Phillip RiceMedical Record Number:  [id number] Date of Birth:  09/09/1997Therapist:  Arlyss Queen, PTMitchel Bell was a no show for an appointment today. Electronically signed by Arlyss Queen, PT 03/30/2020

## 2020-03-30 NOTE — Other
REHABILITATION SERVICES AT [facility] Services At [place] [phone] [address] [place] [address]QIONG Number: [phone] Number: [phone] Therapy No Show Note9/27/2021Patient Name:  Hanzel RiceMedical Record Number:  [id number] Date of Birth:  [birth year]Therapist:  Arlyss Queen, PTMitchel Nevels was a no show for an appointment today; this is his 4th no show & he will be placed on single day status.Electronically signed by Arlyss Queen, PT 03/30/2020

## 2020-03-31 ENCOUNTER — Encounter
Admit: 2020-03-31 | Payer: PRIVATE HEALTH INSURANCE | Attending: Family | Primary: Student in an Organized Health Care Education/Training Program

## 2020-03-31 ENCOUNTER — Telehealth
Admit: 2020-03-31 | Payer: PRIVATE HEALTH INSURANCE | Attending: Hematology & Oncology | Primary: Student in an Organized Health Care Education/Training Program

## 2020-03-31 ENCOUNTER — Encounter
Admit: 2020-03-31 | Payer: PRIVATE HEALTH INSURANCE | Primary: Student in an Organized Health Care Education/Training Program

## 2020-03-31 ENCOUNTER — Encounter
Admit: 2020-03-31 | Payer: PRIVATE HEALTH INSURANCE | Attending: Hematology & Oncology | Primary: Student in an Organized Health Care Education/Training Program

## 2020-03-31 DIAGNOSIS — D571 Sickle-cell disease without crisis: Secondary | ICD-10-CM

## 2020-03-31 MED ORDER — HYDROXYUREA 500 MG CAPSULE
500 mg | ORAL_CAPSULE | 3 refills | Status: AC
Start: 2020-03-31 — End: 2020-05-06

## 2020-03-31 MED ORDER — OXYCODONE IMMEDIATE RELEASE 10 MG TABLET
10 mg | ORAL_TABLET | 1 refills | Status: AC
Start: 2020-03-31 — End: 2020-05-06

## 2020-03-31 NOTE — Telephone Encounter
Patient requesting medication refill.Medication is hydroxyurea (HYDREA) 500 mg capsulePatient also wants to confirm that Oxy 10mg  is taken as needed x60. Please call patient to confirm. Thanks

## 2020-03-31 NOTE — Telephone Encounter
I spoke with Phillip Bell and informed him Phillip Bell refilled hydrea. He still has not received his Oxy ER. He says it looks like it still needs a prior [place]. Informed him I will reach out to pharmacy and to PA team. Phillip Bell appreciated the call.

## 2020-04-01 ENCOUNTER — Inpatient Hospital Stay
Admit: 2020-04-01 | Discharge: 2020-04-01 | Payer: BLUE CROSS/BLUE SHIELD | Primary: Student in an Organized Health Care Education/Training Program

## 2020-04-01 DIAGNOSIS — R29898 Other symptoms and signs involving the musculoskeletal system: Secondary | ICD-10-CM

## 2020-04-01 DIAGNOSIS — M25662 Stiffness of left knee, not elsewhere classified: Secondary | ICD-10-CM

## 2020-04-01 DIAGNOSIS — S72362A Displaced segmental fracture of shaft of left femur, initial encounter for closed fracture: Secondary | ICD-10-CM

## 2020-04-01 NOTE — Other
REHABILITATION SERVICES AT [facility] Services At [place] [phone] [address] [place] [address]WJXBJ Number: [phone] Number: [phone] Therapy No Show Note9/29/2021Patient Name:  Phillip RiceMedical Record Number:  [id number] Date of Birth:  01-07-1997Therapist:  Arlyss Queen, PTMitchel Bell was a no show for an appointment today.Electronically signed by Arlyss Queen, PT 04/01/2020

## 2020-04-02 DIAGNOSIS — S72362A Displaced segmental fracture of shaft of left femur, initial encounter for closed fracture: Secondary | ICD-10-CM

## 2020-04-02 DIAGNOSIS — R29898 Other symptoms and signs involving the musculoskeletal system: Secondary | ICD-10-CM

## 2020-04-02 DIAGNOSIS — M25662 Stiffness of left knee, not elsewhere classified: Secondary | ICD-10-CM

## 2020-04-07 NOTE — Progress Notes
[facility]ick[facility] [phone]/15/2021[name] DOB [birth year] CCScheduled return for sickle cell disease.ProbPatient Active Problem List Diagnosis ? Eustachian tube disorder, bilateral ? Tonsillar and adenoid hypertrophy ? Sickle cell disease, type SS (HC Code) ? 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 Hx acute chest syndrome (HC Code) ? Hx cholecystectomy ? Closed displaced segmental fracture of shaft of left femur, initial encounter (HC Code) ? Sickle cell disease without crisis (HC Code) ? Decreased range of motion (ROM) of left knee ? Decreased strength of lower extremity ? Altered gait MDIn MD in [place] where he is in school, q74m, sickle cell clinic. Advised to have MD in [facility]. Brief HistorySickle cell disease (HbSS)Multiple hospitalizations for acute sickle cell painAvascular necrosis of left femoral headHx acute chest syndromeHx blood transfusionOther PMHxSurgical Hx1/15	Cholecystectomy3/16	PE tubes2017	Wisdom teeth removedFMHxMedCurrent Outpatient Medications Medication Sig ? hydroxyurea (HYDREA) 500 mg capsule 3 tab daily for blood. ? ibuprofen (ADVIL,MOTRIN) 600 mg tablet Up to 4 tablets daily as needed for pain. ? oxyCODONE (OXYCONTIN) 10 mg 12 hr extended release tablet 1 tablet every 12 hours as needed for persistent pain. D57.2 ? oxyCODONE (ROXICODONE) 10 mg Immediate Release tablet 1 - 3 tablets every 3 h as needed for pain. ? Miscellaneous Medical Supply Heel lift Left shoeUse as directed. ? naloxone (NARCAN) 4 mg/actuation spray Use 1 spray in 1 nostril for suspected opioid overdose. May repeat in 2 minutes in other nostril with new device if minimal or no response. ? senna (SENOKOT) 8.6 mg tablet Take 2 tablets (17.2 mg total) by mouth every 3 (three) hours as needed for constipation. (Patient not taking: Reported on 02/11/2020) No current facility-administered medications for this visit. AllergiesNo Known Allergies Interval Hx/ROSMitchel presents for his scheduled follow up appointment. Seen by orthopedic surgeon yesterday.  He is currently attending physical therapy twice a week. He is starting a new job in 1 week.  Ortho has cleared him for work.  He is using one crutch occasionally for ambulation.  He did not pick up rx oxycontin. Reports oxycontin requires prior authorization.Denies fever, chills, SOB, chest pain, nausea, vomiting, or constipation. PEBP 121/69  - Pulse 78  - Temp 98.3 ?F (36.8 ?C) (Oral)  - Resp 19  - Ht 6' 2 (1.88 m)  - Wt 66.8 kg  - SpO2 100%  - BMI 18.91 kg/m?  General: alert, oriented, conversant, nadEyes: anicteric sclera, corrective lensesCV: rrrResp: cta b/l, normal respiratory effortAbdomen:  Soft,non tender, bowel sounds presentExtremities: No erythema, no swelling to left kneePsych: normal affectInterval Lab/ImagingResults in Past 120 DaysResult Component Current Result Alanine Aminotransferase (ALT) 19 (02/13/2020) Albumin 4.5 (02/13/2020) Alkaline Phosphatase 82 (02/13/2020) Aspartate Aminotransferase (AST) 35 (02/13/2020) Bilirubin, Direct 0.3 (02/13/2020) Calcium 9.2 (02/13/2020) Chloride 103 (02/13/2020) CO2 26 (02/13/2020) Creatinine 0.78 (02/13/2020) Hematocrit 23.9 (L) (02/13/2020) Hemoglobin 8.6 (L) (02/13/2020) INR 1.13 (12/20/2019) MCV 101.3 (H) (02/13/2020) Platelets 476 (H) (02/13/2020) Potassium 4.4 (02/13/2020) Sodium 138 (02/13/2020) Total Bilirubin 1.8 (H) (02/13/2020) Total Protein 7.2 (02/13/2020) WBC 6.7 (02/13/2020) Imp/PlanCovid-19?	Discussed 4/20.?	01/16/2020: Vaccinated x 2. Sickle cell disease (Hbxx)?	02/18/2019: On hydroxyurea 500 mg 3 tab daily. Reluctant to increase dose. Sent names of 3 new drugs, glutamine, voxelotor, crizanlizumab. ?	03/20/2019: Read about 2 of 3 meds. No interest at this time. ?	01/16/2020?	Hydroxyurea: Taking 3 tab daily.?	Glutamine: not interested?	Voxelotor: no indication?	Crizanlizumab: not interested?	03/18/2020:  Continues hydroxyurea 3 tabs daily. Transfusion Hx?	09/11/2019: Transfusion [facility] in [place]. Social situation?	Lives: parents in [place]. 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  [place] in [place]. Interests: hanging out, applying for jobs?	01/16/2020: Continues to live with parents. 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 Also considering moving to [employer] (college there).  ?	03/18/2020:  Starting a new job in 1 week. Femur Fx7/15/2021: Injury/surgery 12/20/19. Discharged on apixaban, apparently stopped it early. 03/18/2020:  In follow up with Ortho. Intermittent pain?	Typical sickle cell pain?	Oxycodone 10 mg tab and oxycodone ERT 10 mg #60 uses irregularly but rationally for persistent pain crisis. Last dispensed 7/19 according to PMP. ?	02/18/2019: doing well on oxycodone IR 10 mg #60 and oxycodone ERT 10 mg #60 every month. ?	10/09/2019: Continue as above. No request for Rx's today.?	03/18/2020:  Continue as above. Will request prior authorization from PARS department.Medical marijuana?	Discussed 07/17/17. Certified and accessed thereafter. ?	10/09/2019: Re-certified 3/21.?	01/16/2020: Trying to be clean.Health MaintenanceFYI: 3/10/2021Urine tox screen: OK 07/17/17.PMP: OK 3/10/2021Naloxone: in possession 3/10/2021Urine prot/creat ratio: 0.09 1/19Iron status: 643 7/17IV access: OKCigarettes: noAlcohol: occassionalEye: Sees community MD ophth Dr Luanne Bras: reports q81mVaccine/AntibodiesInfluenza: 10/19PPV23: 10/15, 3/14PCV13: 7/12Men-4: 7/14, 7/11Men-B: 7/19Tdap: 4/21Hep A: Ab pos 1/19Hep B: sAb neg 1/19.  Dose #1 09/07/17; Dose #2 04/16/18 #3 4/07/2021Hep C: Ab neg 1/19Varicella: Ab pos 8/18. HPV: 7/19, 7/14, 8/13HIV: neg 1/19GuidelineInfluenza	Annually				Varicella	Evidence of immunity:PPV23		2 doses 5 y apart; peds doses count			Hx vaccine x 2		3rd dose age 18 					[place] born before 19		8 wks from PCV13					Hx varicella			1 mo from Men-4/Men-B 				Hx varicella zosterPCV13		1 lifetime dose						Ab pos		1 y from PPV23								1 mo from Men-4/Men-B 		Zoster		60+Men-4		2 or 3 doses depending upon vaccine		2 mo apart		1 mo from PPV23/PCV13		HPV		Women thru age 26Men-B		2 doses 2 mo apart		1 mo from PPV23/PCV13				0, 1, 4 moTdap		Every 10 years				HIV		Ab screenHep A		2 doses 6 mo apartHep B		0, 1, 4 moHep C		Ab screen onceDispositionRTC in 2 monthsJoanna Franklin, APRN

## 2020-05-01 ENCOUNTER — Telehealth
Admit: 2020-05-01 | Payer: PRIVATE HEALTH INSURANCE | Attending: Hematology & Oncology | Primary: Student in an Organized Health Care Education/Training Program

## 2020-05-01 ENCOUNTER — Encounter
Admit: 2020-05-01 | Payer: PRIVATE HEALTH INSURANCE | Attending: Hematology & Oncology | Primary: Student in an Organized Health Care Education/Training Program

## 2020-05-01 MED ORDER — OXYCODONE ER 9 MG CAPSULE SPRINKLE EXTENDED RELEASE 12 HR(DON'T CRUSH)
9 mg | ORAL_CAPSULE | Freq: Two times a day (BID) | ORAL | 1 refills | Status: SS
Start: 2020-05-01 — End: 2020-06-17

## 2020-05-01 NOTE — Telephone Encounter
I spoke with pharmacy and Phillip Bell. The pharmacy currently does not have Xtampza in stock, but will get it in on Mon. I verified with Phillip Bell if this was ok and he was agreeable to receive on Mon. I let pharmacy know to proceed with filling. They said they will call him. Phillip Bell appreciated the calls.

## 2020-05-01 NOTE — Telephone Encounter
Patient calling to follow up on medication refill for oxyCODONE (OXYCONTIN) 10 mg 12 hr extended release tabletHe needed a prior auth but has not heard anything since thenPlease call with an update.

## 2020-05-06 ENCOUNTER — Encounter
Admit: 2020-05-06 | Payer: PRIVATE HEALTH INSURANCE | Attending: Family | Primary: Student in an Organized Health Care Education/Training Program

## 2020-05-06 ENCOUNTER — Telehealth
Admit: 2020-05-06 | Payer: PRIVATE HEALTH INSURANCE | Attending: Hematology & Oncology | Primary: Student in an Organized Health Care Education/Training Program

## 2020-05-06 DIAGNOSIS — D571 Sickle-cell disease without crisis: Secondary | ICD-10-CM

## 2020-05-06 MED ORDER — OXYCODONE IMMEDIATE RELEASE 10 MG TABLET
10 mg | ORAL_TABLET | 1 refills | Status: AC
Start: 2020-05-06 — End: 2020-06-03

## 2020-05-06 MED ORDER — IBUPROFEN 600 MG TABLET
600 mg | ORAL_TABLET | 2 refills | Status: SS
Start: 2020-05-06 — End: 2020-06-17

## 2020-05-06 MED ORDER — HYDROXYUREA 500 MG CAPSULE
500 mg | ORAL_CAPSULE | 3 refills | Status: AC
Start: 2020-05-06 — End: 2020-06-03

## 2020-05-06 NOTE — Telephone Encounter
Patient is calling in for medication refill oxyCODONE (ROXICODONE) 10 mg Immediate Release tabletibuprofen (ADVIL,MOTRIN) 600 mg tablethydroxyurea (HYDREA) 500 mg capsule

## 2020-05-18 ENCOUNTER — Encounter
Admit: 2020-05-18 | Payer: PRIVATE HEALTH INSURANCE | Attending: Orthopedic | Primary: Student in an Organized Health Care Education/Training Program

## 2020-05-18 ENCOUNTER — Inpatient Hospital Stay
Admit: 2020-05-18 | Discharge: 2020-05-18 | Payer: BLUE CROSS/BLUE SHIELD | Primary: Student in an Organized Health Care Education/Training Program

## 2020-05-18 ENCOUNTER — Encounter
Admit: 2020-05-18 | Payer: PRIVATE HEALTH INSURANCE | Attending: Family | Primary: Student in an Organized Health Care Education/Training Program

## 2020-05-18 ENCOUNTER — Ambulatory Visit
Admit: 2020-05-18 | Payer: BLUE CROSS/BLUE SHIELD | Attending: Family | Primary: Student in an Organized Health Care Education/Training Program

## 2020-05-18 DIAGNOSIS — M25662 Stiffness of left knee, not elsewhere classified: Secondary | ICD-10-CM

## 2020-05-18 DIAGNOSIS — S72362A Displaced segmental fracture of shaft of left femur, initial encounter for closed fracture: Secondary | ICD-10-CM

## 2020-05-18 DIAGNOSIS — D571 Sickle-cell disease without crisis: Secondary | ICD-10-CM

## 2020-05-18 LAB — RETICULOCYTES
BKR WAM IRF: 36.1 % — ABNORMAL HIGH (ref 3.0–15.9)
BKR WAM RETICULOCYTE - ABS (3 DEC): 0.24 10??6 cells/uL — ABNORMAL HIGH (ref 0.023–0.140)
BKR WAM RETICULOCYTE COUNT PCT (1 DEC): 11 % — ABNORMAL HIGH (ref 0.6–2.7)
BKR WAM RETICULOCYTE HGB EQUIVALENT: 36.5 pg — ABNORMAL HIGH (ref 28.2–35.7)

## 2020-05-18 LAB — CBC WITH AUTO DIFFERENTIAL
BKR WAM ABSOLUTE IMMATURE GRANULOCYTES.: 0.05 x 1000/??L (ref 0.00–0.30)
BKR WAM ABSOLUTE LYMPHOCYTE COUNT.: 3.61 x 1000/??L (ref 0.60–3.70)
BKR WAM ABSOLUTE NRBC (2 DEC): 0.4 x 1000/??L (ref 0.00–1.00)
BKR WAM ANALYZER ANC: 4.16 x 1000/??L (ref 2.00–7.60)
BKR WAM BASOPHIL ABSOLUTE COUNT.: 0.08 x 1000/??L (ref 0.00–1.00)
BKR WAM BASOPHILS: 0.8 % (ref 0.0–1.4)
BKR WAM EOSINOPHIL ABSOLUTE COUNT.: 0.33 x 1000/??L (ref 0.00–1.00)
BKR WAM EOSINOPHILS: 3.5 % (ref 0.0–5.0)
BKR WAM HEMATOCRIT (2 DEC): 23.6 % — ABNORMAL LOW (ref 38.50–50.00)
BKR WAM HEMOGLOBIN: 8.3 g/dL — ABNORMAL LOW (ref 13.2–17.1)
BKR WAM IMMATURE GRANULOCYTES: 0.5 % (ref 0.0–1.0)
BKR WAM LYMPHOCYTES: 37.9 % (ref 17.0–50.0)
BKR WAM MCH (PG): 37.6 pg — ABNORMAL HIGH (ref 27.0–33.0)
BKR WAM MCHC: 35.2 g/dL (ref 31.0–36.0)
BKR WAM MCV: 106.8 fL — ABNORMAL HIGH (ref 80.0–100.0)
BKR WAM MONOCYTE ABSOLUTE COUNT.: 1.3 x 1000/??L — ABNORMAL HIGH (ref 0.00–1.00)
BKR WAM MONOCYTES: 13.6 % — ABNORMAL HIGH (ref 4.0–12.0)
BKR WAM MPV: 9.4 fL (ref 8.0–12.0)
BKR WAM NEUTROPHILS: 43.7 % (ref 39.0–72.0)
BKR WAM NUCLEATED RED BLOOD CELLS: 3.7 % — ABNORMAL HIGH (ref 0.0–1.0)
BKR WAM PLATELETS: 464 x1000/??L — ABNORMAL HIGH (ref 150–420)
BKR WAM RDW-CV: 15.6 % — ABNORMAL HIGH (ref 11.0–15.0)
BKR WAM RED BLOOD CELL COUNT.: 2.21 M/??L — ABNORMAL LOW (ref 4.00–6.00)
BKR WAM WHITE BLOOD CELL COUNT: 9.5 x1000/??L (ref 4.0–11.0)

## 2020-05-18 LAB — BASIC METABOLIC PANEL
BKR ANION GAP: 10 (ref 7–17)
BKR BLOOD UREA NITROGEN: 9 mg/dL (ref 6–20)
BKR BUN / CREAT RATIO: 11.7 (ref 8.0–23.0)
BKR CALCIUM: 9.6 mg/dL (ref 8.8–10.2)
BKR CHLORIDE: 105 mmol/L — ABNORMAL HIGH (ref 98–107)
BKR CO2: 24 mmol/L (ref 20–30)
BKR CREATININE: 0.77 mg/dL (ref 0.40–1.30)
BKR EGFR (AFR AMER): >60 mL/min/1.73m2 — ABNORMAL HIGH (ref >60)
BKR EGFR (NON AFRICAN AMERICAN): >60 mL/min/1.73m2 (ref >60)
BKR GLUCOSE: 102 mg/dL — ABNORMAL HIGH (ref 70–100)
BKR POTASSIUM: 3.9 mmol/L (ref 3.3–5.3)
BKR SODIUM: 139 mmol/L (ref 136–144)

## 2020-05-18 LAB — HEPATIC FUNCTION PANEL
BKR A/G RATIO: 1.9 (ref 1.0–2.2)
BKR ALANINE AMINOTRANSFERASE (ALT): 14 U/L (ref 9–59)
BKR ALBUMIN: 4.9 g/dL (ref 3.6–4.9)
BKR ALKALINE PHOSPHATASE: 71 U/L (ref 9–122)
BKR ASPARTATE AMINOTRANSFERASE (AST): 30 U/L — ABNORMAL LOW (ref 10–35)
BKR AST/ALT RATIO: 2.1
BKR BILIRUBIN DIRECT: 0.3 mg/dL (ref <=0.3)
BKR BILIRUBIN TOTAL: 2.3 mg/dL — ABNORMAL HIGH (ref <=1.2)
BKR GLOBULIN: 2.6 g/dL (ref 2.3–3.5)
BKR PROTEIN TOTAL: 7.5 g/dL (ref 6.6–8.7)

## 2020-06-03 ENCOUNTER — Telehealth
Admit: 2020-06-03 | Payer: PRIVATE HEALTH INSURANCE | Attending: Hematology & Oncology | Primary: Student in an Organized Health Care Education/Training Program

## 2020-06-03 ENCOUNTER — Encounter
Admit: 2020-06-03 | Payer: PRIVATE HEALTH INSURANCE | Attending: Family | Primary: Student in an Organized Health Care Education/Training Program

## 2020-06-03 DIAGNOSIS — D571 Sickle-cell disease without crisis: Secondary | ICD-10-CM

## 2020-06-03 MED ORDER — OXYCODONE IMMEDIATE RELEASE 10 MG TABLET
10 mg | ORAL_TABLET | 1 refills | Status: SS
Start: 2020-06-03 — End: 2020-06-17

## 2020-06-03 MED ORDER — HYDROXYUREA 500 MG CAPSULE
500 mg | ORAL_CAPSULE | 3 refills | Status: AC
Start: 2020-06-03 — End: 2020-10-08

## 2020-06-03 NOTE — Telephone Encounter
Pt called inRe:refilloxycodone 10 mghydroxurea [facility] [place]

## 2020-06-09 NOTE — Progress Notes
[facility]ult [facility]ick[facility] [phone]/15/2021[name] DOB [birth year] CCScheduled return for sickle cell disease.ProbPatient Active Problem List Diagnosis ? Eustachian tube disorder, bilateral ? Tonsillar and adenoid hypertrophy ? Sickle cell disease, type SS (HC Code) (HC CODE) ? 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 Hx acute chest syndrome (HC Code) ? Hx cholecystectomy ? Closed displaced segmental fracture of shaft of left femur, initial encounter (HC Code) (HC CODE) ? Sickle cell disease without crisis (HC Code) (HC CODE) ? Decreased range of motion (ROM) of left knee ? Decreased strength of lower extremity ? Altered gait MDIn MD in [place] where he is in school, q53m, sickle cell clinic. Advised to have MD in [facility]. Brief HistorySickle cell disease (HbSS)Multiple hospitalizations for acute sickle cell painAvascular necrosis of left femoral headHx acute chest syndromeHx blood transfusionOther PMHxSurgical Hx1/15	Cholecystectomy3/16	PE tubes2017	Wisdom teeth removedFMHxMedCurrent Outpatient Medications Medication Sig ? ibuprofen (ADVIL,MOTRIN) 600 mg tablet Up to 4 tablets daily as needed for pain. ? Miscellaneous Medical Supply Heel lift Left shoeUse as directed. ? oxyCODONE myristate (XTAMPZA ER) 9 mg 12 hr extended release sprinkle capsule Take 1 capsule (9 mg total) by mouth every 12 (twelve) hours. Long-acting medicine to control pain. 340b. D57.1. For prior authorization Fax [phone]. ? hydroxyurea (HYDREA) 500 mg capsule 3 tab daily for blood. ? naloxone (NARCAN) 4 mg/actuation spray Use 1 spray in 1 nostril for suspected opioid overdose. May repeat in 2 minutes in other nostril with new device if minimal or no response. ? oxyCODONE (ROXICODONE) 10 mg Immediate Release tablet 1 - 3 tablets every 3 h as needed for pain. ? senna (SENOKOT) 8.6 mg tablet Take 2 tablets (17.2 mg total) by mouth every 3 (three) hours as needed for constipation. (Patient not taking: Reported on 02/11/2020) No current facility-administered medications for this visit. AllergiesNo Known Allergies Interval Hx/ROSMitchel presents for his scheduled follow up appointment. Reports he is doing okay.  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 He continues to use a cane for ambulation. He has not been to physical therapy x 2 months due to work.  He is scheduled to see Ortho in December and plans to resume physical therapy.  His left hip is sore when he ambulates.  Plans to travel to [place] the end of the week to celebrate his birthday.Has tried the [facility] ER for long acting pain relief. Reports the medication bothers his stomach. He has only taken it once, but reports he will re-try. If it is still causes an upset stomach, he will let us know. PEBP 112/63  - Pulse 68  - Temp 98.6 ?F (37 ?C) (Oral)  - Resp 20  - Ht 6' 2 (1.88 m)  - Wt 68.7 kg  - SpO2 100%  - BMI 19.45 kg/m?  General: alert, oriented, conversant, nadEyes: anicteric sclera, corrective lensesCV: rrrResp: cta b/l, normal respiratory effortAbdomen:  Soft,non tender, bowel sounds presentExtremities: No erythema, no swelling to left kneePsych: normal affectInterval Lab/ImagingResults for Phillip Bell (MRN [id number]) as of 06/09/2020 13:59 Ref. Range 05/18/2020 13:47 Sodium Latest Ref Range: 136 - 144 mmol/L 139 Potassium Latest Ref Range: 3.3 - 5.3 mmol/L 3.9 Chloride Latest Ref Range: 98 - 107 mmol/L 105 CO2 Latest Ref Range: 20 - 30 mmol/L 24 Anion Gap Latest Ref Range: 7 - 17  10 BUN Latest Ref Range: 6 - 20 mg/dL 9 Creatinine Latest Ref Range: 0.40 - 1.30 mg/dL 8.29 BUN/Creatinine Ratio Latest Ref Range: 8.0 - 23.0  11.7 eGFR (NON African-American)  Latest Ref Range: >60 mL/min/1.58m2 >60 eGFR (Afr Amer) Latest Ref Range: >60 mL/min/1.24m2 >60 Glucose Latest Ref Range: 70 - 100 mg/dL 578 (H) Calcium Latest Ref Range: 8.8 - 10.2 mg/dL 9.6 Total Bilirubin Latest Ref Range: <=1.2 mg/dL 2.3 (H) Bilirubin, Direct Latest Ref Range: <=0.3 mg/dL 0.3 Alkaline Phosphatase Latest Ref Range: 9 - 122 U/L 71 Alanine Aminotransferase (ALT) Latest Ref Range: 9 - 59 U/L 14 Aspartate Aminotransferase (AST) Latest Ref Range: 10 - 35 U/L 30 AST/ALT Ratio Latest Ref Range: See Comment  2.1 Total Protein Latest Ref Range: 6.6 - 8.7 g/dL 7.5 Albumin Latest Ref Range: 3.6 - 4.9 g/dL 4.9 Globulin Latest Ref Range: 2.3 - 3.5 g/dL 2.6 A/G Ratio Latest Ref Range: 1.0 - 2.2  1.9 WBC Latest Ref Range: 4.0 - 11.0 x1000/?L 9.5 RBC Latest Ref Range: 4.00 - 6.00 M/?L 2.21 (L) Hemoglobin Latest Ref Range: 13.2 - 17.1 g/dL 8.3 (L) Hematocrit Latest Ref Range: 38.50 - 50.00 % 23.60 (L) MCV Latest Ref Range: 80.0 - 100.0 fL 106.8 (H) MCH Latest Ref Range: 27.0 - 33.0 pg 37.6 (H) MCHC Latest Ref Range: 31.0 - 36.0 g/dL 46.9 RDW-CV Latest Ref Range: 11.0 - 15.0 % 15.6 (H) Platelets Latest Ref Range: 150 - 420 x1000/?L 464 (H) MPV Latest Ref Range: 8.0 - 12.0 fL 9.4 Neutrophils Latest Ref Range: 39.0 - 72.0 % 43.7 Lymphocytes Latest Ref Range: 17.0 - 50.0 % 37.9 Monocytes Latest Ref Range: 4.0 - 12.0 % 13.6 (H) Eosinophils Latest Ref Range: 0.0 - 5.0 % 3.5 Basophils Latest Ref Range: 0.0 - 1.4 % 0.8 Immature Granulocytes Latest Ref Range: 0.0 - 1.0 % 0.5 nRBC Latest Ref Range: 0.0 - 1.0 % 3.7 (H) ANC (Abs Neutrophil Count) Latest Ref Range: 2.00 - 7.60 x 1000/?L 4.16 Absolute Lymphocyte Count Latest Ref Range: 0.60 - 3.70 x 1000/?L 3.61 Monocytes (Absolute) Latest Ref Range: 0.00 - 1.00 x 1000/?L 1.30 (H) Eosinophil Absolute Count Latest Ref Range: 0.00 - 1.00 x 1000/?L 0.33 Basophils Absolute Latest Ref Range: 0.00 - 1.00 x 1000/?L 0.08 Immature Granulocytes (Abs) Latest Ref Range: 0.00 - 0.30 x 1000/?L 0.05 nRBC Absolute Latest Ref Range: 0.00 - 1.00 x 1000/?L 0.40 Retic [place] Pct Latest Ref Range: 0.6 - 2.7 % 11.0 (H) Absolute Reticulocyte Count Latest Ref Range: 0.023 - 0.140 10?6 cells/uL 0.240 (H) Immature Retic Fract Latest Ref Range: 3.0 - 15.9 % 36.1 (H) Reticulocyte Hgb Equivalent Latest Ref Range: 28.2 - 35.7 pg 36.5 (H) Imp/PlanCovid-19?	Discussed 4/20.?	01/16/2020: Vaccinated x 2. Sickle cell disease (Hbxx)?	02/18/2019: On hydroxyurea 500 mg 3 tab daily. Reluctant to increase dose. Sent names of 3 new drugs, glutamine, voxelotor, crizanlizumab. ?	03/20/2019: Read about 2 of 3 meds. No interest at this time. ?	01/16/2020?	Hydroxyurea: Taking 3 tab daily.?	Glutamine: not interested?	Voxelotor: no indication?	Crizanlizumab: not interested?	05/18/2020:  Continues hydroxyurea 3 tabs daily. Transfusion Hx?	09/11/2019: Transfusion [facility] in [place]. Social situation?	Lives: parents in [place]. 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 Interests: hanging out, applying for jobs?	01/16/2020: Continues to live with parents. 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 Also considering moving to [employer] (college there).  ?	03/18/2020:  Starting a new job in 1 week. ?	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 Discharged on apixaban, apparently stopped it early. 03/18/2020:  In follow up with Ortho.  05/18/2020: Would like to return back to having physical therapy, he will communicate with Orthopedic.Intermittent pain?	Typical sickle cell pain?	Oxycodone 10 mg tab and oxycodone ERT 10 mg #60 uses irregularly but rationally for persistent pain crisis. Last dispensed 7/19 according to PMP. ?	02/18/2019: doing well on oxycodone IR 10 mg #60 and oxycodone ERT 10 mg #60 every month. ?	10/09/2019: Continue as above. No request for Rx's today.?	03/18/2020:  Continue as above. Will request prior authorization from PARS department.?	11/15/2021Marlowe Kays and oxycodone continueMedical marijuana?	Discussed 07/17/17. Certified and accessed thereafter. ?	10/09/2019: Re-certified 3/21.?	01/16/2020: Trying to be clean.Health MaintenanceFYI: 3/10/2021Urine tox screen: OK 07/17/17.PMP: OK 3/10/2021Naloxone: in possession 3/10/2021Urine prot/creat ratio: 0.09 1/19Iron status: 643 7/17IV access: OKCigarettes: noAlcohol: occassionalEye: Sees community MD ophth Dr Luanne Bras: reports q42mVaccine/AntibodiesInfluenza: 10/19: Declined today would like to receive on 12/14/2021PPV23: 10/15, 3/14PCV13: 7/12Men-4: 7/14, 7/11Men-B: 7/19Tdap: 4/21Hep A: Ab pos 1/19Hep B: sAb neg 1/19.  Dose #1 09/07/17; Dose #2 04/16/18 #3 4/07/2021Hep C: Ab neg 1/19Varicella: Ab pos 8/18. HPV: 7/19, 7/14, 8/13HIV: neg 1/19GuidelineInfluenza	Annually				Varicella	Evidence of immunity:PPV23		2 doses 5 y apart; peds doses count			Hx vaccine x 2		3rd dose age 75 					[place] born before [birth year]		8 wks from PCV13					Hx varicella			1 mo from Men-4/Men-B 				Hx varicella zosterPCV13		1 lifetime dose						Ab pos		1 y from PPV23								1 mo from Men-4/Men-B 		Zoster		60+Men-4		2 or 3 doses depending upon vaccine		2 mo apart		1 mo from PPV23/PCV13		HPV		Women thru age 26Men-B		2 doses 2 mo apart		1 mo from PPV23/PCV13				0, 1, 4 moTdap		Every 10 years				HIV		Ab screenHep A		2 doses 6 mo apartHep B		0, 1, 4 moHep C		Ab screen onceDispositionRTC on 06/16/2020 for influenza vaccineRTC in 8 weeks for follow up visitJoanna Richardson Dopp, APRN

## 2020-06-13 ENCOUNTER — Emergency Department
Admit: 2020-06-13 | Payer: PRIVATE HEALTH INSURANCE | Primary: Student in an Organized Health Care Education/Training Program

## 2020-06-13 ENCOUNTER — Inpatient Hospital Stay: Admit: 2020-06-13 | Discharge: 2020-06-13 | Payer: BLUE CROSS/BLUE SHIELD

## 2020-06-13 DIAGNOSIS — K219 Gastro-esophageal reflux disease without esophagitis: Secondary | ICD-10-CM

## 2020-06-13 DIAGNOSIS — R079 Chest pain, unspecified: Secondary | ICD-10-CM

## 2020-06-13 DIAGNOSIS — Z79899 Other long term (current) drug therapy: Secondary | ICD-10-CM

## 2020-06-13 DIAGNOSIS — M549 Dorsalgia, unspecified: Secondary | ICD-10-CM

## 2020-06-13 DIAGNOSIS — D5700 Hb-SS disease with crisis, unspecified: Secondary | ICD-10-CM

## 2020-06-13 LAB — HEPATIC FUNCTION PANEL
BKR A/G RATIO: 1.7 (ref 1.0–2.2)
BKR ALANINE AMINOTRANSFERASE (ALT): 26 U/L (ref 9–59)
BKR ALBUMIN: 4.7 g/dL (ref 3.6–4.9)
BKR ALKALINE PHOSPHATASE: 76 U/L (ref 9–122)
BKR ASPARTATE AMINOTRANSFERASE (AST): 58 U/L — ABNORMAL HIGH (ref 10–35)
BKR AST/ALT RATIO: 2.2 M/??L — ABNORMAL LOW (ref 4.00–6.00)
BKR BILIRUBIN DIRECT: 0.2 mg/dL (ref <=0.3)
BKR BILIRUBIN TOTAL: 2.5 mg/dL — ABNORMAL HIGH (ref <=1.2)
BKR GLOBULIN: 2.7 g/dL (ref 2.3–3.5)
BKR PROTEIN TOTAL: 7.4 g/dL (ref 6.6–8.7)

## 2020-06-13 LAB — CBC WITH AUTO DIFFERENTIAL
BKR EGFR (NON AFRICAN AMERICAN): 1.4 % (ref 0.0–5.0)
BKR WAM ABSOLUTE IMMATURE GRANULOCYTES.: 0.1 x 1000/??L (ref 0.00–0.30)
BKR WAM ABSOLUTE LYMPHOCYTE COUNT.: 2.67 x 1000/??L (ref 0.60–3.70)
BKR WAM ABSOLUTE NRBC (2 DEC): 0.3 x 1000/??L (ref 0.00–1.00)
BKR WAM ANALYZER ANC: 8.93 x 1000/??L — ABNORMAL HIGH (ref 2.00–7.60)
BKR WAM BASOPHIL ABSOLUTE COUNT.: 0.06 x 1000/??L (ref 0.00–1.00)
BKR WAM BASOPHILS: 0.4 % (ref 0.0–1.4)
BKR WAM EOSINOPHIL ABSOLUTE COUNT.: 0.2 x 1000/??L (ref 0.00–1.00)
BKR WAM EOSINOPHILS: 1.4 % (ref 0.0–5.0)
BKR WAM HEMATOCRIT (2 DEC): 20.6 % — ABNORMAL LOW (ref 38.50–50.00)
BKR WAM IMMATURE GRANULOCYTES: 0.7 % (ref 0.0–1.0)
BKR WAM LYMPHOCYTES: 19.3 % (ref 17.0–50.0)
BKR WAM MCH (PG): 36.6 pg — ABNORMAL HIGH (ref 27.0–33.0)
BKR WAM MCHC: 35.9 g/dL (ref 31.0–36.0)
BKR WAM MCV: 102 fL — ABNORMAL HIGH (ref 80.0–100.0)
BKR WAM MONOCYTE ABSOLUTE COUNT.: 1.89 x 1000/??L — ABNORMAL HIGH (ref 0.00–1.00)
BKR WAM MONOCYTES: 13.6 % — ABNORMAL HIGH (ref 4.0–12.0)
BKR WAM MPV: 9.4 fL (ref 8.0–12.0)
BKR WAM NEUTROPHILS: 64.6 % (ref 39.0–72.0)
BKR WAM NUCLEATED RED BLOOD CELLS: 2.3 % — ABNORMAL HIGH (ref 0.0–1.0)
BKR WAM PLATELETS: 414 x1000/??L (ref 150–420)
BKR WAM RDW-CV: 15.7 % — ABNORMAL HIGH (ref 11.0–15.0)
BKR WAM RED BLOOD CELL COUNT.: 2.02 M/ÂµL — ABNORMAL LOW (ref 4.00–6.00)
BKR WAM WHITE BLOOD CELL COUNT: 13.9 x1000/ÂµL — ABNORMAL HIGH (ref 4.0–11.0)

## 2020-06-13 LAB — BASIC METABOLIC PANEL
BKR ANION GAP: 13 (ref 7–17)
BKR BLOOD UREA NITROGEN: 10 mg/dL (ref 6–20)
BKR BUN / CREAT RATIO: 13 % (ref 8.0–23.0)
BKR CALCIUM: 9.3 mg/dL (ref 8.8–10.2)
BKR CHLORIDE: 102 mmol/L (ref 98–107)
BKR CO2: 24 mmol/L — ABNORMAL HIGH (ref 20–30)
BKR CREATININE: 0.77 mg/dL (ref 0.40–1.30)
BKR EGFR (AFR AMER): >60 mL/min/{1.73_m2} (ref >60)
BKR GLUCOSE: 122 mg/dL — ABNORMAL HIGH (ref 70–100)
BKR POTASSIUM: 3.6 mmol/L — ABNORMAL LOW (ref 3.3–5.3)
BKR SODIUM: 139 mmol/L (ref 136–144)

## 2020-06-13 LAB — RETICULOCYTES
BKR WAM IRF: 39.4 % — ABNORMAL HIGH (ref 3.0–15.9)
BKR WAM PLATELETS: 39.4 % — ABNORMAL HIGH (ref 3.0–15.9)
BKR WAM RETICULOCYTE - ABS (3 DEC): 0.164 10Ë6 cells/uL — ABNORMAL HIGH (ref 0.023–0.140)
BKR WAM RETICULOCYTE HGB EQUIVALENT: 36.6 pg — ABNORMAL HIGH (ref 28.2–35.7)

## 2020-06-13 MED ORDER — HYDROMORPHONE 2 MG/ML INJECTION SOLUTION
2 mg/mL | Freq: Once | INTRAVENOUS | Status: CP
Start: 2020-06-13 — End: ?
  Administered 2020-06-13: 09:00:00 2 mL via INTRAVENOUS

## 2020-06-13 MED ORDER — KETOROLAC 30 MG/ML (1 ML) INJECTION SOLUTION
30 mg/mL (1 mL) | Freq: Once | INTRAVENOUS | Status: CP
Start: 2020-06-13 — End: ?
  Administered 2020-06-13: 09:00:00 30 mL via INTRAVENOUS

## 2020-06-13 MED ORDER — HYDROMORPHONE 2 MG/ML INJECTION SOLUTION
2 mg/mL | INTRAVENOUS | Status: CP
Start: 2020-06-13 — End: ?
  Administered 2020-06-13 (×2): 2 mL via INTRAVENOUS

## 2020-06-13 NOTE — Discharge Instructions
Please follow up with your Primary Care Physician in 24-48 hoursIf you do not have a primary care physician please establish care immediately with one of the following clinics at the numbers listed below:[facility]: [phone] [facility]: [phone]: [phone]: [phone] copy of your results have been provided to youPlease come back if any of the following: Fever, chest pain, abdominal pain, shortness of breath, nausea, vomiting, urinary or bowel irregularities.

## 2020-06-13 NOTE — ED Notes
5:33 AM Pt medicated per MAR. 7:03 AM Pt given dc papers, IV removed, pt ambulatory with a steady gait

## 2020-06-13 NOTE — ED Notes
3:50 AM 24 Y/O Male presents to the ED from home with reports of Sickle Cell Pain that began a couple days. Per patient pain has gotten in worse in past 24 hours. Patient reports that pain is currently in his mid back 10/10 and both side of his ribs 10/10. Patient has been taking Tylenol and oxycodone prescribed by his PCP since Tuesday without any relief. Patient is alert and oriented, able to ambulate. Patient denies any other pain. PIV placed. Blood specimen collected. Pending MD assessment. 4:53 AM Pt medicated per MAR with Dilaudid.

## 2020-06-15 ENCOUNTER — Encounter
Admit: 2020-06-15 | Payer: PRIVATE HEALTH INSURANCE | Attending: Family | Primary: Student in an Organized Health Care Education/Training Program

## 2020-06-15 ENCOUNTER — Inpatient Hospital Stay
Admit: 2020-06-15 | Discharge: 2020-06-18 | Payer: BLUE CROSS/BLUE SHIELD | Source: Home / Self Care | Attending: Hematology & Oncology | Admitting: Hematology & Oncology

## 2020-06-15 ENCOUNTER — Emergency Department
Admit: 2020-06-15 | Payer: PRIVATE HEALTH INSURANCE | Primary: Student in an Organized Health Care Education/Training Program

## 2020-06-15 DIAGNOSIS — Z0000 Encounter for general adult medical examination without abnormal findings: Secondary | ICD-10-CM

## 2020-06-15 DIAGNOSIS — D571 Sickle-cell disease without crisis: Secondary | ICD-10-CM

## 2020-06-15 DIAGNOSIS — D5700 Hb-SS disease with crisis, unspecified: Secondary | ICD-10-CM

## 2020-06-15 MED ORDER — SODIUM CHLORIDE 0.9 % BOLUS (NEW BAG)
0.9 % | Freq: Once | INTRAVENOUS | Status: CP
Start: 2020-06-15 — End: ?
  Administered 2020-06-15: 23:00:00 0.9 mL/h via INTRAVENOUS

## 2020-06-15 MED ORDER — VANCOMYCIN 1 G IN 250 ML IVPB (VIALMATE)
Freq: Three times a day (TID) | INTRAVENOUS | Status: DC
Start: 2020-06-15 — End: 2020-06-16
  Administered 2020-06-16 (×2): 250.000 mL/h via INTRAVENOUS

## 2020-06-15 MED ORDER — NALOXONE 0.4 MG/ML INJECTION SOLUTION
0.4 mg/mL | INTRAVENOUS | Status: DC | PRN
Start: 2020-06-15 — End: 2020-06-18

## 2020-06-15 MED ORDER — DOXYCYCLINE TAB/CAP 100 MG (WRAPPED E-RX)
100 mg | Freq: Two times a day (BID) | ORAL | Status: DC
Start: 2020-06-15 — End: 2020-06-16
  Administered 2020-06-16: 02:00:00 100 mg via ORAL

## 2020-06-15 MED ORDER — SODIUM CHLORIDE 0.9 % (FLUSH) INJECTION SYRINGE
0.9 % | INTRAVENOUS | Status: DC | PRN
Start: 2020-06-15 — End: 2020-06-16

## 2020-06-15 MED ORDER — HYDROMORPHONE 2 MG/ML INJECTION SOLUTION
2 mg/mL | Freq: Once | INTRAVENOUS | Status: CP
Start: 2020-06-15 — End: ?
  Administered 2020-06-15: 23:00:00 2 mL via INTRAVENOUS

## 2020-06-15 MED ORDER — HYDROMORPHONE 2 MG/ML INJECTION SOLUTION
2 mg/mL | Freq: Once | INTRAVENOUS | Status: CP
Start: 2020-06-15 — End: ?
  Administered 2020-06-15: 22:00:00 2 mL via INTRAVENOUS

## 2020-06-15 MED ORDER — HYDROMORPHONE (DILAUDID) PCA 1 MG/ML (50 ML) YNH PYXIS
1 mg/ml | INTRAVENOUS | Status: DC
Start: 2020-06-15 — End: 2020-06-17
  Administered 2020-06-16: 02:00:00 1 mL via INTRAVENOUS

## 2020-06-15 MED ORDER — KETOROLAC 30 MG/ML (1 ML) INJECTION SOLUTION
30 mg/mL (1 mL) | Freq: Once | INTRAMUSCULAR | Status: CP
Start: 2020-06-15 — End: ?
  Administered 2020-06-15: 23:00:00 30 mL via INTRAMUSCULAR

## 2020-06-15 MED ORDER — SODIUM CHLORIDE 0.9 % (FLUSH) INJECTION SYRINGE
0.9 % | Freq: Three times a day (TID) | INTRAVENOUS | Status: DC
Start: 2020-06-15 — End: 2020-06-16
  Administered 2020-06-16: 12:00:00 0.9 mL via INTRAVENOUS

## 2020-06-15 MED ORDER — PIPERACILLIN-TAZOBACTAM (ZOSYN) 4.5GM MBP
Freq: Four times a day (QID) | INTRAVENOUS | Status: DC
Start: 2020-06-15 — End: 2020-06-16
  Administered 2020-06-16 (×2): 100.000 mL/h via INTRAVENOUS

## 2020-06-15 NOTE — ED Provider Notes
Resident NoteSynopsis: 24 y.o. male with a PMH of sickle cell disease who presents with thoracic back and chest pain since yesterday morning.  Patient awoke with the pain and progressively worse throughout the day despite home ibuprofen and oxycodone.  Patient has his usual taking crises involve thoracic back pain.  Denies SOB, palpitations, abdominal pain, nausea, vomiting, fever.  Has otherwise been in his usual state of health.DDx:  Sickle cell pain crisis, acute chest syndrome.  Low suspicion for pneumonia, PE, ACSPertinent Physical Exam:  Diffuse thoracic back tenderness, without midline tenderness.  Diffuse low anterior chest tenderness.  Tachycardic.  Abdomen soft nontender. MDM:  Basic labs, reticulocyte count, CXR, ECGCourse:Toradol, Dilaudid 1 IV x3 per pain plan [place], MDEmergency Medicine PGY4Reachable on Mobile HeartbeatHistoryChief Complaint Patient presents with ? Sickle Cell Pain   sickle cell crisis, c/o back and chest pain. took Motrin and Oxycodone at home with no relief.   The history is provided by the patient. OtherThis is a recurrent problem. The current episode started 6 to 12 hours ago. The problem occurs constantly. The problem has not changed since onset.Associated symptoms include chest pain. Pertinent negatives include no abdominal pain, no headaches and no shortness of breath. Nothing aggravates the symptoms. Nothing relieves the symptoms. Treatments tried: ibuprofen, oxy. The treatment provided no relief.  Past Medical History: Diagnosis Date ? 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 Avascular necrosis of femur head, left (HC Code)  ? Chronic pain   sickle cell ? Conductive hearing loss 09/22/14 P.E tubes placed ? Dactylitis one episode  April, 1998 ? GERD (gastroesophageal reflux disease)  ? 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 On hydroxyurea therapy started October 2013 ? Pneumococcal vaccination given 01/20/2006 and 09/28/2012 ? 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 Vasoocclusive sickle cell crisis (HC Code) Adm 01/02/2012, ER 04/08/12, 4/1-10/03/12, 7/2-01/05/13, 12/02/13-12/03/13 , March 2016 - 2 admissions Past Surgical History: Procedure Laterality Date ? CHOLECYSTECTOMY, LAPAROSCOPIC  1/15 ? TYMPANOSTOMY TUBE PLACEMENT  09/22/2014  by Dr. Bernita Raisin Family History Problem Relation Age of Onset ? Sickle cell trait Mother  ? Thyroid disease Mother  ? Sickle cell trait Father  ? Sarcoidosis Father  ? Diabetes Paternal Grandfather  ? Hypertension Paternal Grandfather  ? Alcohol abuse Maternal Aunt  Social History Socioeconomic History ? Marital status: Single   Spouse name: Not on file ? Number of children: Not on file ? Years of education: Not on file ? Highest education level: Not on file Tobacco Use ? Smoking status: Never Smoker ? Smokeless tobacco: Never Used Substance and Sexual Activity ? Alcohol use: Yes   Comment: beer and liquor on occasion,  ? Drug use: Yes   Types: Marijuana   Comment: last used on Tuesday ? 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  Will get a Masters, MBA in business  [facility] Other Social History E-cigarette/Vaping Substances E-cigarette/Vaping Devices Review of Systems Constitutional: Negative for chills and fever. HENT: Negative for rhinorrhea and sore throat.  Respiratory: Negative for cough and shortness of breath.  Cardiovascular: Positive for chest pain. Gastrointestinal: Negative for abdominal pain, nausea and vomiting. Musculoskeletal: Positive for back pain. Negative for myalgias. Neurological: Negative for dizziness, weakness, light-headedness and headaches. All other systems reviewed and are negative. Physical ExamED Triage Vitals [06/13/20 0339]BP:  132/78Pulse: (!) 104Pulse from  O2 sat: n/aResp: 18Temp: 98.5 ?F (36.9 ?C)Temp src: OralSpO2: 98 % BP 124/69  - Pulse 89  - Temp 98.6 ?F (37 ?C) (Oral)  - Resp 18  - SpO2 100% Physical ExamVitals and nursing note reviewed. Constitutional:     General: He is not in acute distress.   Appearance: He is well-developed. He is not diaphoretic. HENT:    Head: Normocephalic and atraumatic.    Nose: Nose normal. Eyes:    Conjunctiva/sclera: Conjunctivae normal. Cardiovascular:    Rate and Rhythm: Regular rhythm. Tachycardia present.    Heart sounds: Normal heart sounds. Pulmonary:    Effort: Pulmonary effort is normal.    Breath sounds: Normal breath sounds. Chest:    Chest wall: Tenderness present. Abdominal:    General: There is no distension.    Palpations: Abdomen is soft.    Tenderness: There is no abdominal tenderness. Musculoskeletal:    Thoracic back: Tenderness present. Skin:   General: Skin is warm and dry. Neurological:    Mental Status: He is alert and oriented to person, place, and time.  ProceduresProcedures ED COURSEInterpreted by ED Provider: labs and x-rayPatient Reevaluation: Attending Supervised: ResidentI saw and examined the patient. I agree with the findings and plan of care as documented in the resident's note. 24yM h/o sickle cell presents with diffuse back and chest pain of days duration similar to usual sickle cell pain without fever, shortness of breath, nausea, vomiting. Concern for worsening crisis vs acute chest. Will follow pain regimen, CXR and reassessKwame TuffuorTuffuor MD: Patient reassessed and reports improvement of symptoms. Strict return precautions have been given. All questions answered and concerns addressed Critical care provided by attending: no critical carePatient progress: stableStroke DocumentationStroke/TIA: NoClinical Impressions as of Jun 14 2349 Sickle cell pain crisis (HC Code) (HC CODE) Acute bilateral thoracic back pain Chest pain, unspecified type  ED DispositionDischarge Arlone Lenhardt, MD12/12/21 2350

## 2020-06-16 ENCOUNTER — Encounter
Admit: 2020-06-16 | Payer: PRIVATE HEALTH INSURANCE | Attending: Internal Medicine | Primary: Student in an Organized Health Care Education/Training Program

## 2020-06-16 ENCOUNTER — Encounter
Admit: 2020-06-16 | Payer: PRIVATE HEALTH INSURANCE | Attending: Orthopaedic Trauma | Primary: Student in an Organized Health Care Education/Training Program

## 2020-06-16 DIAGNOSIS — Z7964 On hydroxyurea therapy: Secondary | ICD-10-CM

## 2020-06-16 DIAGNOSIS — D6189 Other specified aplastic anemias and other bone marrow failure syndromes: Secondary | ICD-10-CM

## 2020-06-16 DIAGNOSIS — IMO0002 Dactylitis: Secondary | ICD-10-CM

## 2020-06-16 DIAGNOSIS — Z23 Encounter for immunization: Secondary | ICD-10-CM

## 2020-06-16 DIAGNOSIS — D7389 Other diseases of spleen: Secondary | ICD-10-CM

## 2020-06-16 DIAGNOSIS — G8929 Other chronic pain: Secondary | ICD-10-CM

## 2020-06-16 DIAGNOSIS — D571 Sickle-cell disease without crisis: Secondary | ICD-10-CM

## 2020-06-16 DIAGNOSIS — M87052 Idiopathic aseptic necrosis of left femur: Secondary | ICD-10-CM

## 2020-06-16 DIAGNOSIS — H902 Conductive hearing loss, unspecified: Secondary | ICD-10-CM

## 2020-06-16 DIAGNOSIS — K219 Gastro-esophageal reflux disease without esophagitis: Secondary | ICD-10-CM

## 2020-06-16 DIAGNOSIS — D5700 Hb-SS disease with crisis, unspecified: Secondary | ICD-10-CM

## 2020-06-16 LAB — LEGIONELLA AND S. PNEUMONIAE ANTIGEN, URINE     (BH GH LMW YH)
BKR LEGIONELLA ANTIGEN: NEGATIVE
BKR S. PNEUMONIAE URINE ANTIGEN: NEGATIVE

## 2020-06-16 LAB — RETICULOCYTES
BKR WAM IRF: 37.7 % — ABNORMAL HIGH (ref 3.0–15.9)
BKR WAM RETICULOCYTE - ABS (3 DEC): 0.205 10Ë6 cells/uL — ABNORMAL HIGH (ref 0.023–0.140)
BKR WAM RETICULOCYTE COUNT PCT (1 DEC): 9.9 % — ABNORMAL HIGH (ref 0.6–2.7)
BKR WAM RETICULOCYTE HGB EQUIVALENT: 33.6 pg (ref 28.2–35.7)

## 2020-06-16 LAB — LOWER RESP CULTURE, QUAL: BKR GRAM STAIN (ROUTINE): >25

## 2020-06-16 LAB — CBC WITH AUTO DIFFERENTIAL
BKR PH, UA: 9.2 fL (ref 8.0–12.0)
BKR WAM ABSOLUTE IMMATURE GRANULOCYTES.: 0.12 x 1000/??L (ref 0.00–0.30)
BKR WAM ABSOLUTE IMMATURE GRANULOCYTES.: 0.13 x 1000/??L (ref 0.00–0.30)
BKR WAM ABSOLUTE LYMPHOCYTE COUNT.: 1.41 x 1000/??L (ref 0.60–3.70)
BKR WAM ABSOLUTE LYMPHOCYTE COUNT.: 1.8 x 1000/??L (ref 0.60–3.70)
BKR WAM ABSOLUTE NRBC (2 DEC): 0.1 x 1000/??L (ref 0.00–1.00)
BKR WAM ABSOLUTE NRBC (2 DEC): 0.1 x 1000/??L (ref 0.00–1.00)
BKR WAM ANALYZER ANC: 10.84 x 1000/??L — ABNORMAL HIGH (ref 2.00–7.60)
BKR WAM ANALYZER ANC: 14 x 1000/ÂµL — ABNORMAL HIGH (ref 2.00–7.60)
BKR WAM BASOPHIL ABSOLUTE COUNT.: 0.04 x 1000/??L (ref 0.00–1.00)
BKR WAM BASOPHIL ABSOLUTE COUNT.: 0.05 x 1000/??L (ref 0.00–1.00)
BKR WAM BASOPHILS: 0.3 % (ref 0.0–1.4)
BKR WAM EOSINOPHIL ABSOLUTE COUNT.: 0.01 x 1000/??L (ref 0.00–1.00)
BKR WAM EOSINOPHIL ABSOLUTE COUNT.: 0.15 x 1000/??L (ref 0.00–1.00)
BKR WAM EOSINOPHILS: 0.1 % (ref 0.0–5.0)
BKR WAM EOSINOPHILS: 1 % (ref 0.0–5.0)
BKR WAM HEMATOCRIT (2 DEC): 20.9 % — ABNORMAL LOW (ref 38.50–50.00)
BKR WAM HEMATOCRIT (2 DEC): 22.7 % — ABNORMAL LOW (ref 38.50–50.00)
BKR WAM HEMOGLOBIN: 7.3 g/dL — ABNORMAL LOW (ref 13.2–17.1)
BKR WAM HEMOGLOBIN: 8 g/dL — ABNORMAL LOW (ref 13.2–17.1)
BKR WAM IMMATURE GRANULOCYTES: 0.7 % (ref 0.0–1.0)
BKR WAM IMMATURE GRANULOCYTES: 0.8 % (ref 0.0–1.0)
BKR WAM LYMPHOCYTES: 11.7 % — ABNORMAL LOW (ref 17.0–50.0)
BKR WAM LYMPHOCYTES: 7.9 % — ABNORMAL LOW (ref 17.0–50.0)
BKR WAM MCH (PG): 34.5 pg — ABNORMAL HIGH (ref 27.0–33.0)
BKR WAM MCH (PG): 35.3 pg — ABNORMAL HIGH (ref 27.0–33.0)
BKR WAM MCHC: 34.9 g/dL (ref 31.0–36.0)
BKR WAM MCHC: 35.2 g/dL (ref 31.0–36.0)
BKR WAM MCV: 101 fL — ABNORMAL HIGH (ref 80.0–100.0)
BKR WAM MCV: 97.8 fL (ref 80.0–100.0)
BKR WAM MONOCYTE ABSOLUTE COUNT.: 2.23 x 1000/??L — ABNORMAL HIGH (ref 0.00–1.00)
BKR WAM MONOCYTE ABSOLUTE COUNT.: 2.44 x 1000/??L — ABNORMAL HIGH (ref 0.00–1.00)
BKR WAM MONOCYTES: 12.5 % — ABNORMAL HIGH (ref 4.0–12.0)
BKR WAM MONOCYTES: 15.8 % — ABNORMAL HIGH (ref 4.0–12.0)
BKR WAM MPV: 9.2 fL (ref 8.0–12.0)
BKR WAM MPV: 9.2 fL (ref 8.0–12.0)
BKR WAM NEUTROPHILS: 70.4 % (ref 39.0–72.0)
BKR WAM NEUTROPHILS: 78.6 % — ABNORMAL HIGH (ref 39.0–72.0)
BKR WAM NUCLEATED RED BLOOD CELLS: 0.4 % (ref 0.0–1.0)
BKR WAM NUCLEATED RED BLOOD CELLS: 0.6 % (ref 0.0–1.0)
BKR WAM PLATELETS: 437 x1000/ÂµL — ABNORMAL HIGH (ref 150–420)
BKR WAM PLATELETS: 470 x1000/??L — ABNORMAL HIGH (ref 150–420)
BKR WAM RDW-CV: 14.5 % (ref 11.0–15.0)
BKR WAM RDW-CV: 17.6 % — ABNORMAL HIGH (ref 11.0–15.0)
BKR WAM RED BLOOD CELL COUNT.: 2.07 M/??L — ABNORMAL LOW (ref 4.00–6.00)
BKR WAM RED BLOOD CELL COUNT.: 2.32 M/ÂµL — ABNORMAL LOW (ref 4.00–6.00)
BKR WAM WHITE BLOOD CELL COUNT: 15.4 x1000/ÂµL — ABNORMAL HIGH (ref 4.0–11.0)
BKR WAM WHITE BLOOD CELL COUNT: 17.8 x1000/ÂµL — ABNORMAL HIGH (ref 4.0–11.0)

## 2020-06-16 LAB — URINALYSIS WITH CULTURE REFLEX      (BH LMW YH)
BKR BILIRUBIN, UA: NEGATIVE
BKR BLOOD, UA: NEGATIVE
BKR GLUCOSE, UA: NEGATIVE
BKR LEUKOCYTE ESTERASE, UA: NEGATIVE
BKR NITRITE, UA: NEGATIVE % (ref 0.0–1.0)
BKR SPECIFIC GRAVITY, UA: 1.017 (ref 1.005–1.030)
BKR UROBILINOGEN, UA: <2 EU/dL — ABNORMAL HIGH (ref <=2.0)
BKR WAM BASOPHILS: NEGATIVE % (ref 0.0–1.4)

## 2020-06-16 LAB — BASIC METABOLIC PANEL
BKR ANION GAP: 12 g/dL (ref 7–17)
BKR ANION GAP: 11 g/dL — ABNORMAL LOW (ref 7–17)
BKR BLOOD UREA NITROGEN: 12 mg/dL (ref 6–20)
BKR BLOOD UREA NITROGEN: 12 mg/dL (ref 6–20)
BKR BUN / CREAT RATIO: 17.6 (ref 8.0–23.0)
BKR BUN / CREAT RATIO: 20 (ref 8.0–23.0)
BKR CALCIUM: 9.2 mg/dL (ref 8.8–10.2)
BKR CALCIUM: 9 mg/dL (ref 8.8–10.2)
BKR CHLORIDE: 98 mmol/L — ABNORMAL HIGH (ref 98–107)
BKR CHLORIDE: 98 mmol/L (ref 98–107)
BKR CO2: 24 mmol/L — ABNORMAL HIGH (ref 20–30)
BKR CO2: 24 mmol/L (ref 20–30)
BKR CREATININE: 0.68 mg/dL (ref 0.40–1.30)
BKR CREATININE: 0.6 mg/dL — ABNORMAL HIGH (ref 0.40–1.30)
BKR EGFR (AFR AMER): >60 mL/min/{1.73_m2} (ref >60)
BKR EGFR (AFR AMER): >60 mL/min/1.73m2 — ABNORMAL HIGH (ref >60)
BKR EGFR (NON AFRICAN AMERICAN): >60 mL/min/1.73m2 (ref >60)
BKR EGFR (NON AFRICAN AMERICAN): >60 mL/min/{1.73_m2} (ref >60)
BKR GLUCOSE: 115 mg/dL — ABNORMAL HIGH (ref 70–100)
BKR GLUCOSE: 129 mg/dL — ABNORMAL HIGH (ref 70–100)
BKR POTASSIUM: 4.1 mmol/L (ref 3.3–5.3)
BKR POTASSIUM: 4 mmol/L (ref 3.3–5.3)
BKR SODIUM: 134 mmol/L — ABNORMAL LOW (ref 136–144)
BKR SODIUM: 133 mmol/L — ABNORMAL LOW (ref 136–144)

## 2020-06-16 LAB — HEPATIC FUNCTION PANEL
BKR A/G RATIO: 1.3 (ref 1.0–2.2)
BKR ALANINE AMINOTRANSFERASE (ALT): 21 U/L (ref 9–59)
BKR ALBUMIN: 4.2 g/dL (ref 3.6–4.9)
BKR ALKALINE PHOSPHATASE: 100 U/L (ref 9–122)
BKR ASPARTATE AMINOTRANSFERASE (AST): 31 U/L — ABNORMAL HIGH (ref 10–35)
BKR AST/ALT RATIO: 1.5
BKR BILIRUBIN DIRECT: 0.4 mg/dL — ABNORMAL HIGH (ref <=0.3)
BKR BILIRUBIN TOTAL: 2.9 mg/dL — ABNORMAL HIGH (ref <=1.2)
BKR GLOBULIN: 3.3 g/dL (ref 2.3–3.5)
BKR PROTEIN TOTAL: 7.5 g/dL — ABNORMAL HIGH (ref 6.6–8.7)

## 2020-06-16 LAB — RESPIRATORY VIRUS PCR PANEL  (YH VERIGENE)(LAB ORDER ONLY)
BKR ADENOVIRUS: NEGATIVE
BKR HUMAN METAPNEUMOVIRUS (HMPV): NEGATIVE
BKR INFLUENZA A: NEGATIVE
BKR INFLUENZA B: NEGATIVE
BKR PARAINFLUENZA VIRUS 1: NEGATIVE
BKR PARAINFLUENZA VIRUS 2: NEGATIVE
BKR PARAINFLUENZA VIRUS 3: NEGATIVE
BKR PARAINFLUENZA VIRUS 4: NEGATIVE
BKR RESPIRATORY SYNCYTIAL VIRUS: NEGATIVE % — ABNORMAL LOW (ref 38.50–50.00)
BKR RHINOVIRUS: NEGATIVE

## 2020-06-16 LAB — PROCALCITONIN     (BH GH LMW Q YH): BKR PROCALCITONIN: 0.18 ng/mL

## 2020-06-16 LAB — MRSA BY PCR- VANCO RX (PHARMACY USE ONLY) (YH): BKR MRSA COLONIZATION STATUS PCR: NOT DETECTED

## 2020-06-16 LAB — UA REFLEX CULTURE

## 2020-06-16 LAB — SARS COV-2 (COVID-19) RNA-~~LOC~~ LABS (BH GH LMW YH): BKR SARS-COV-2 RNA (COVID-19) (YH): NEGATIVE

## 2020-06-16 MED ORDER — ENOXAPARIN 40 MG/0.4 ML SUBCUTANEOUS SYRINGE
400.4 mg/0.4 mL | SUBCUTANEOUS | Status: DC
Start: 2020-06-16 — End: 2020-06-18
  Administered 2020-06-16: 15:00:00 40 mg/0.4 mL via SUBCUTANEOUS

## 2020-06-16 MED ORDER — OXYCODONE IMMEDIATE RELEASE 30 MG TABLET
30 mg | ORAL | Status: DC
Start: 2020-06-16 — End: 2020-06-18
  Administered 2020-06-16 – 2020-06-18 (×5): 30 mg via ORAL

## 2020-06-16 MED ORDER — ONDANSETRON HCL (PF) 4 MG/2 ML INJECTION SOLUTION
42 mg/2 mL | Freq: Three times a day (TID) | INTRAVENOUS | Status: DC | PRN
Start: 2020-06-16 — End: 2020-06-18

## 2020-06-16 MED ORDER — DEXTROSE 5 % AND 0.45 % SODIUM CHLORIDE INTRAVENOUS SOLUTION
INTRAVENOUS | Status: DC
Start: 2020-06-16 — End: 2020-06-16
  Administered 2020-06-16: 07:00:00 1000.000 mL/h via INTRAVENOUS

## 2020-06-16 MED ORDER — NALOXONE 0.4 MG/ML INJECTION SOLUTION
0.4 mg/mL | INTRAVENOUS | Status: DC | PRN
Start: 2020-06-16 — End: 2020-06-16

## 2020-06-16 MED ORDER — ENOXAPARIN 40 MG/0.4 ML SUBCUTANEOUS SYRINGE
40 mg/0.4 mL | Freq: Two times a day (BID) | SUBCUTANEOUS | Status: DC
Start: 2020-06-16 — End: 2020-06-16

## 2020-06-16 MED ORDER — KETOROLAC 30 MG/ML (1 ML) INJECTION SOLUTION
30 mg/mL (1 mL) | Freq: Four times a day (QID) | INTRAVENOUS | Status: DC | PRN
Start: 2020-06-16 — End: 2020-06-16

## 2020-06-16 MED ORDER — KETOROLAC 30 MG/ML (1 ML) INJECTION SOLUTION
301 mg/mL (1 mL) | Freq: Four times a day (QID) | INTRAVENOUS | Status: DC
Start: 2020-06-16 — End: 2020-06-18
  Administered 2020-06-16 – 2020-06-18 (×7): 30 mL via INTRAVENOUS

## 2020-06-16 MED ORDER — SENNOSIDES 8.6 MG TABLET
8.6 mg | Freq: Every evening | ORAL | Status: DC
Start: 2020-06-16 — End: 2020-06-16

## 2020-06-16 MED ORDER — DIPHENHYDRAMINE 25 MG CAPSULE
25 mg | Freq: Four times a day (QID) | ORAL | Status: DC | PRN
Start: 2020-06-16 — End: 2020-06-18

## 2020-06-16 MED ORDER — HYDROXYUREA 500 MG CAPSULE
500 mg | Freq: Every day | ORAL | Status: DC
Start: 2020-06-16 — End: 2020-06-18
  Administered 2020-06-16 – 2020-06-18 (×3): 500 mg via ORAL

## 2020-06-16 MED ORDER — DEXTROSE 5 % AND 0.45 % SODIUM CHLORIDE INTRAVENOUS SOLUTION
INTRAVENOUS | Status: DC
Start: 2020-06-16 — End: 2020-06-17
  Administered 2020-06-16: 15:00:00 1000.000 mL/h via INTRAVENOUS

## 2020-06-16 MED ORDER — CEFTRIAXONE IV PUSH 1000 MG VIAL & NS (ADULTS)
INTRAVENOUS | Status: DC
Start: 2020-06-16 — End: 2020-06-17
  Administered 2020-06-16 – 2020-06-17 (×2): 10.000 mL via INTRAVENOUS

## 2020-06-16 MED ORDER — OXYCODONE IMMEDIATE RELEASE 5 MG TABLET
5 mg | ORAL | Status: DC
Start: 2020-06-16 — End: 2020-06-18
  Administered 2020-06-17 – 2020-06-18 (×5): 5 mg via ORAL

## 2020-06-16 MED ORDER — SENNOSIDES 8.6 MG TABLET
8.6 mg | ORAL | Status: DC
Start: 2020-06-16 — End: 2020-06-18
  Administered 2020-06-16: 15:00:00 8.6 mg via ORAL

## 2020-06-16 MED ORDER — OXYCODONE IMMEDIATE RELEASE 5 MG TABLET
5 mg | ORAL | Status: DC
Start: 2020-06-16 — End: 2020-06-18
  Administered 2020-06-16 – 2020-06-17 (×5): 5 mg via ORAL

## 2020-06-16 MED ORDER — DOXYCYCLINE TAB/CAP 100 MG (WRAPPED E-RX)
100 mg | Freq: Two times a day (BID) | ORAL | Status: DC
Start: 2020-06-16 — End: 2020-06-18
  Administered 2020-06-16 – 2020-06-18 (×5): 100 mg via ORAL

## 2020-06-16 MED ORDER — DOCUSATE SODIUM 100 MG CAPSULE
100 mg | Freq: Every day | ORAL | Status: DC
Start: 2020-06-16 — End: 2020-06-16
  Administered 2020-06-16: 15:00:00 100 mg via ORAL

## 2020-06-16 NOTE — Plan of Care
Plan of Care Overview/ Patient Status    [id number] Note NursingMitchel Bell is a 24 y.o. male admitted with a chief complaint of sickle cell crisis. Patient arrived from Arrived From: homePatient is   A/Ox4. VSS on RA ex tachy to low 100s at times. Lung sounds diminished in bilat bases. Abd soft, NT. LBM 12/13 per pt. Pt c/o 7-8/10 back & rib pain tx w/ 1:1 dilaudid cadd (0.5/10/3) w/ fair effect. 1 unit pRBC given, tolerated well. CBC sent s/p 1 hr transfusion completed. D51/2NS infusing at 50 cc/hr. EKG completed. Reg diet. OOB ind using cane, steady gait. Taking pills whole. Safety checks completed. Vitals:  06/16/20 0032 06/16/20 0057 06/16/20 0100 06/16/20 0132 BP: 123/70 139/84  136/60 Pulse: 90 (!) 105  87 Resp: 16   20 Temp: 98 ?F (36.7 ?C) 99.2 ?F (37.3 ?C)  98.8 ?F (37.1 ?C) TempSrc: Oral    SpO2: 99% 97%   Weight:   64.4 kg  Height:   6' 1 (1.854 m)  Oxygen TherapySpO2: 97 %Device (Oxygen Therapy): room airSee flowsheets, patient education and plan of care for additional information.

## 2020-06-16 NOTE — H&P
[facility]	 [place] [facility] Hospitalist Attending History & PhysicalHistory provided by: the patient, recordsHistory limited by: no limitationsSubjective: Chief Complaint: chest and back painHPI: Patient is a 24yo man c past hx of sickle cell disease (HgbSS), AVN of L femoral head, GERD, admission 6/21 for L femoral fracture, who one week ago developed constant sharp pain across chest with radiation to his back that escalated to a 10/10 and was associated with dyspnea/ cough minimally productive of yellow-green sputum, for which presents for evaluation. The pain is pleuritic and positional but not exertional. ROS is negative for fever/ chills/ night sweats/ sick contacts, headache/ vision change, palpitations, abd pain/ nausea/ vomiting/ diarrhea/ constipation/ bpr, dysuria/ hematuria/ frequency, rash, and all other ROS negative.ED course: 100.6, 113 --> 84, 97%, 18, 130/81, given vancomycin 1g IV, pip-tazo 4.5g IV, doxycycline 100mg  PO, NS x , toradol 15mg  IM, dilaudid 1mg  IV x2, admitted for [facility] and acute chest syndrome vs. pneumonia. Medical History: PMH PSH Past Medical History: Diagnosis Date ? Aplastic crisis (HC Code) (HC CODE) 6/6/ 2005 transfusion ? Avascular necrosis of femur head, left (HC Code) (HC CODE)  ? Chronic pain   sickle cell ? Conductive hearing loss 09/22/14 P.E tubes placed ? Dactylitis one episode  April, 1998 ? GERD (gastroesophageal reflux disease)  ? 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 On hydroxyurea therapy started October 2013 ? Pneumococcal vaccination given 01/20/2006 and 09/28/2012 ? 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 CHOLECYSTECTOMY, LAPAROSCOPIC  1/15 ? TYMPANOSTOMY TUBE PLACEMENT  09/22/2014  by Dr. Bernita Raisin  Social History Family History Social History Socioeconomic History ? Marital status: Single   Spouse name: Not on file ? Number of children: Not on file ? Years of education: Not on file ? Highest education level: Not on file Occupational History ? Occupation: Soil scientist Tobacco Use ? Smoking status: Never Smoker ? Smokeless tobacco: Never Used Substance and Sexual Activity ? Alcohol use: Yes   Comment: beer and liquor on occasion ? Drug use: Yes   Types: Marijuana   Comment: last used on Tuesday ? Sexual activity: Yes Other Topics Concern ? Not on file Social History Narrative  Lives in [place].    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  Will get a Masters, Set designer in business  [address] [employer] Social Determinants of Conservation officer, historic buildings Strain:  ? Difficulty of Paying Living Expenses:  Food Insecurity:  ? Worried About Programme researcher, broadcasting/film/video in the Last Year:  ? Barista in the Last Year:  Transportation Needs:  ? Freight forwarder (Medical):  ? Lack of Transportation (Non-Medical):  Physical Activity:  ? Days of Exercise per Week:  ? Minutes of Exercise per Session:  Stress:  ? Feeling of Stress :  Social Connections:  ? Frequency of Communication with Friends and Family:  ? Frequency of Social Gatherings with Friends and Family:  ? Attends Religious Services:  ? Active Member of Clubs or Organizations:  ? Attends Banker Meetings:  ? Marital Status:  Intimate Partner Violence:  ? Fear of Current or Ex-Partner:  ? Emotionally Abused:  ? Physically Abused:  ?  Sexually Abused:   Family History Problem Relation Age of Onset ? Sickle cell trait Mother  ? Thyroid disease Mother  ? Sickle cell trait Father  ? Sarcoidosis Father  ? Diabetes Paternal Grandfather  ? Hypertension Paternal Grandfather  ? Alcohol abuse Maternal Aunt   Prior to Admission Medications Medications Prior to Admission Medication Sig Dispense Refill Last Dose ? hydroxyurea (HYDREA) 500 mg capsule 3 tab daily for blood. 90 capsule 2  ? ibuprofen (ADVIL,MOTRIN) 600 mg tablet Up to 4 tablets daily as needed for pain. 100 tablet 1  ? naloxone (NARCAN) 4 mg/actuation spray Use 1 spray in 1 nostril for suspected opioid overdose. May repeat in 2 minutes in other nostril with new device if minimal or no response. 2 each 1  ? oxyCODONE (ROXICODONE) 10 mg Immediate Release tablet 1 - 3 tablets every 3 h as needed for pain. 60 tablet 0  ? oxyCODONE myristate (XTAMPZA ER) 9 mg 12 hr extended release sprinkle capsule Take 1 capsule (9 mg total) by mouth every 12 (twelve) hours. Long-acting medicine to control pain. 340b. D57.1. For prior authorization Fax [phone]. 12 capsule 0  ? Miscellaneous Medical Supply Heel lift Left shoeUse as directed. 1 each 0  ? senna (SENOKOT) 8.6 mg tablet Take 2 tablets (17.2 mg total) by mouth every 3 (three) hours as needed for constipation. (Patient not taking: Reported on 02/11/2020) 30 tablet 0 Not Taking at Unknown time   Home medication list obtained from: the patientAllergies No Known Allergies Review of Systems: Review of Systems: as per HPIObjective: Physical Exam: Vitals: BP 139/84  - Pulse (!) 105  - Temp 99.2 ?F (37.3 ?C)  - Resp 16  - Ht 6' 1 (1.854 m)  - Wt 64.4 kg  - SpO2 97%  - BMI 18.73 kg/m? Constitutional: pleasant young man, non-toxic appearingEyes: anicteric scleraENMT: mmm, op clearCardiovascular: rr, mild tachycardia, no m/r/g, no JVDRespiratory: decreased breath sounds at bases posteriorlyGI: normal bowel sounds, abdomen soft, nt, ndGU: no suprapubic tendernessHeme/ Lymph: wwp, lower extremities symmetric, no edemaNeurologic: a+o x3, non-focalPsychiatric: normal affectSkin: no rashPertinent Labs/Diagnostics: Labs: Wbc 17.8 c N78Hgb 7.3 <-- 7.4 on 12/11Plt 470Na 134BUN 12Creatinine 0.68T bili 2.9Dbili 0.4UA: 1+ ketonesBlood cx pendingMRSA swab pendingSARSCoV2 negativeDiagnostics:CXR Final Result   Small consolidation in the right lung base. These opacities in the left lung base. Small bilateral pleural effusions.  Reported And Signed By: Forrestine Him, MD    [facility]  ECG: sinus at 78, nml axis/ int, slight J-point elevation V1-V3, TWI III, TWF F, similar to prior (my read)Assessment: 24yo man c past hx of sickle cell disease (HgbSS), AVN of L femoral head, GERD,  admission 6/21 for L femoral fracture, who one week ago developed constant sharp pain across chest with radiation to his back that escalated to a 10/10 and was associated with dyspnea/ cough minimally productive of yellow-green sputum, on exam temp 100.6/ tachycardic/ saturating well on room air/ decreased breath sounds as bases, labs notable for wbc 17.8 c N78/ hgb 7.3 (bl)/ indirect hyperbilirubinemia/ ketonuria, CXR shows BB opacities and small B pleural effusions, ECG nad. Picture is consistent with vaso-occlusive crisis with associated acute chest syndrome versus pneumonia.Plan: 1. SIRS: ? in setting of pneumonia vs. acute chest syndrome- f/u blood cx, MRSA swab, RVP- send sputum cx- add on procalcitonin - continue vancomycin + pip-tazobactam for now to cover HCAP given recent admission- trend wbc/ temp curves2. SCC:- transfuse PRBC x 1U for goal hgb 9- defer exchange  transfusion for now- continue home hydrea- dilaudid PCA 0.5mg  q10 minutes demand dose, 1 hour lockout 3mg - ketorolac 15mg  IV q6h prn- APAP 650mg  PO q6h prn- hot/ cold packs prn- colace, senna, prn lactulose- appreciate hematology input3. Medication reconciliation status: completeFEN: regular dietPPx: lovenoxCODE: FULL Notifications: PCP: Delphia Grates    Primary Care Provider was notified of this admission. YesFamily was notified of this admission. NoPlan discussed with patient and/or family. YesSigned:Judyann Casasola L Ronniesha Seibold MD12/14/20211:31 AM

## 2020-06-16 NOTE — ED Notes
7:24 PM Assumed care of pt. Pt back from imaging. Pt reports increased chest and back pain, 8/10. 9:11 PMUrine sent. COVID and MRSA swabs sent. ABX started as ordered. VSS. 2nd PIV placed. PCA pump started. Pt educated about use. 10:10 PMVanco started. 12:33 AMVS remain stable. Transport booked for pt to go to inpatient bed.

## 2020-06-16 NOTE — Plan of Care
Plan of Care Overview/ Patient Status    Assumed patient care at shift change , Pt awake talking on the phone, assessment done as charted, Pt is A/Ox4. VSS, good saturation on RA. Lung sounds diminished in bilat bases. Abd soft, NT. LBM 12/13 per pt report. Pt c/o 6-7/10 bilat back & rib pain,PRN pain med givenm,  PCA pump rate and volume verified. 1 unit pRBC given, tolerated well. CBC sent to be send per order. D51/2NS infusing at 30 cc/hr.  Reg diet. OOB independently to bathroom using cane, steady gait,meds given per MAR, Pt Takes pills whole with water. Safety checks completed and maintained.Pt remains comfortable, no other concerns voiced , will keep current plan of therapy and monitor for change.

## 2020-06-16 NOTE — Utilization Review (ED)
UM Status: Commercial - IP, SCD, IV analgesia, PCA

## 2020-06-16 NOTE — Other
Hematology Brief Note24 yo man with sickle cell disease (HbSS, %S 80.6% 12/2019) on hydroxyurea 1500mg  daily c/b pain crises and avascular necrosis of L femur, presenting with chest pain and back pain. Tmax 100.6, HR 113 -> 84, RR 16, BP 123/81, SpO2 98% RAWBC 17.8, 79% neutrophilsHgb 7.3Hct 20.9Plts 470BMP wnlCXR 12/13 -  Small consolidation in the right lung base. These opacities in the left lung base. Small bilateral pleural effusionsCOVID PCR pendingBlood cx pendingConcern for acute chest syndrome with temp 100.6 and CXR with new radiodensities. Cannot distinguish ACS from pneumonia. Thankfully no O2 requirement and seems to be hemodynamically stable. No indication for exchange transfusion at this time. We recommend simple transfusion to dilute hemoglobin S until he has symptom relief.- add on hemoglobinopathy evaluation to labs- transfuse 1u RBCs now - recheck CBC 1 hr after transfusion is completed- can transfuse another unit again if repeat CBC's Hgb is less than 9 - should not exceed Hgb 10 as there is risk of hyperviscosity- send sputum cx if he has productive cough- send respiratory virus PCR panel- agree with antibiotics for pneumonia coverage- agree with dilaudid PCA for pain control- okay for admission to medicine service - please indicate preference for EP 6-7Discussed with attending Dr. Laddie Aquas. Thank you for your care of this patient. Please reach out via [facility] with questions. Marinell Blight, MD, MPHHematology/Oncology Fellow, PGY-5December 13, 2021

## 2020-06-16 NOTE — ED Notes
5:00 PM Pt presents for sickle cell flare-up. Pt complains of pain in his back and chest. Pt states it started about 2 weeks ago and he couldn't take it anymore. Takes 10mg  oxycodone at home that was not helping. Pt reports 10/10 pain. Pt A&Ox4, VS stable, NAD.

## 2020-06-17 ENCOUNTER — Telehealth
Admit: 2020-06-17 | Payer: PRIVATE HEALTH INSURANCE | Attending: Orthopaedic Trauma | Primary: Student in an Organized Health Care Education/Training Program

## 2020-06-17 ENCOUNTER — Encounter
Admit: 2020-06-17 | Payer: PRIVATE HEALTH INSURANCE | Attending: Hematology & Oncology | Primary: Student in an Organized Health Care Education/Training Program

## 2020-06-17 DIAGNOSIS — S72362A Displaced segmental fracture of shaft of left femur, initial encounter for closed fracture: Secondary | ICD-10-CM

## 2020-06-17 DIAGNOSIS — D571 Sickle-cell disease without crisis: Secondary | ICD-10-CM

## 2020-06-17 LAB — CBC WITH AUTO DIFFERENTIAL
BKR BLOOD UREA NITROGEN: 95.3 fL (ref 80.0–100.0)
BKR WAM ABSOLUTE IMMATURE GRANULOCYTES.: 0.1 x 1000/??L (ref 0.00–0.30)
BKR WAM ABSOLUTE LYMPHOCYTE COUNT.: 3.29 x 1000/??L (ref 0.60–3.70)
BKR WAM ABSOLUTE NRBC (2 DEC): 0.1 x 1000/??L (ref 0.00–1.00)
BKR WAM ANALYZER ANC: 8.89 x 1000/??L — ABNORMAL HIGH (ref 2.00–7.60)
BKR WAM BASOPHIL ABSOLUTE COUNT.: 0.07 x 1000/??L (ref 0.00–1.00)
BKR WAM BASOPHILS: 0.5 % (ref 0.0–1.4)
BKR WAM EOSINOPHIL ABSOLUTE COUNT.: 0.37 x 1000/??L (ref 0.00–1.00)
BKR WAM EOSINOPHILS: 2.5 % (ref 0.0–5.0)
BKR WAM HEMATOCRIT (2 DEC): 24.3 % — ABNORMAL LOW (ref 38.50–50.00)
BKR WAM HEMOGLOBIN: 8.6 g/dL — ABNORMAL LOW (ref 13.2–17.1)
BKR WAM IMMATURE GRANULOCYTES: 0.7 % (ref 0.0–1.0)
BKR WAM LYMPHOCYTES: 22.5 % (ref 17.0–50.0)
BKR WAM MCH (PG): 33.7 pg — ABNORMAL HIGH (ref 27.0–33.0)
BKR WAM MCHC: 35.4 g/dL (ref 31.0–36.0)
BKR WAM MCV: 95.3 fL (ref 80.0–100.0)
BKR WAM MONOCYTE ABSOLUTE COUNT.: 1.93 x 1000/??L — ABNORMAL HIGH (ref 0.00–1.00)
BKR WAM MONOCYTES: 13.2 % — ABNORMAL HIGH (ref 4.0–12.0)
BKR WAM MPV: 8.9 fL (ref 8.0–12.0)
BKR WAM NEUTROPHILS: 60.6 % (ref 39.0–72.0)
BKR WAM NUCLEATED RED BLOOD CELLS: 0.8 % (ref 0.0–1.0)
BKR WAM PLATELETS: 544 x1000/??L — ABNORMAL HIGH (ref 150–420)
BKR WAM RDW-CV: 19.8 % — ABNORMAL HIGH (ref 11.0–15.0)
BKR WAM RED BLOOD CELL COUNT.: 2.55 M/??L — ABNORMAL LOW (ref 4.00–6.00)
BKR WAM WHITE BLOOD CELL COUNT: 14.7 x1000/ÂµL — ABNORMAL HIGH (ref 4.0–11.0)

## 2020-06-17 LAB — BASIC METABOLIC PANEL
BKR ANION GAP: 12 (ref 7–17)
BKR BUN / CREAT RATIO: 15.2 (ref 8.0–23.0)
BKR CALCIUM: 9.2 mg/dL (ref 8.8–10.2)
BKR CHLORIDE: 102 mmol/L — ABNORMAL HIGH (ref 98–107)
BKR CO2: 23 mmol/L (ref 20–30)
BKR CREATININE: 0.66 mg/dL (ref 0.40–1.30)
BKR EGFR (AFR AMER): >60 mL/min/{1.73_m2} — ABNORMAL HIGH (ref >60)
BKR EGFR (NON AFRICAN AMERICAN): >60 mL/min/{1.73_m2} (ref >60)
BKR GLUCOSE: 132 mg/dL — ABNORMAL HIGH (ref 70–100)
BKR POTASSIUM: 3.7 mmol/L (ref 3.3–5.3)
BKR SODIUM: 137 mmol/L (ref 136–144)

## 2020-06-17 LAB — HEMOGLOBINOPATHY EVALUATION SCREENING
BKR HEMOGLOBIN A2 QUANTITATION: 3.3 % (ref 0.0–4.0)
BKR HEMOGLOBIN F QUANTITATION: 13.3 % — ABNORMAL HIGH (ref 0.0–2.0)
BKR HGB S: 83.4 % — ABNORMAL HIGH

## 2020-06-17 MED ORDER — OXYCODONE IMMEDIATE RELEASE 15 MG TABLET
15 mg | ORAL_TABLET | 1 refills | Status: AC
Start: 2020-06-17 — End: 2020-07-09

## 2020-06-17 MED ORDER — CEFUROXIME AXETIL 500 MG TABLET
500 mg | Freq: Two times a day (BID) | ORAL | Status: DC
Start: 2020-06-17 — End: 2020-06-18
  Administered 2020-06-18: 14:00:00 500 mg via ORAL

## 2020-06-17 MED ORDER — IBUPROFEN 600 MG TABLET
600 mg | ORAL_TABLET | 2 refills | Status: AC
Start: 2020-06-17 — End: 2020-08-06

## 2020-06-17 MED ORDER — OXYCODONE ER 13.5 MG CAPSULE SPRINKLE EXTEND RELEASE 12HR(DON'T CRUSH)
13.5 mg | ORAL_CAPSULE | Freq: Two times a day (BID) | ORAL | 1 refills | Status: AC
Start: 2020-06-17 — End: 2020-09-01

## 2020-06-17 NOTE — Telephone Encounter
Patient contacted and made aware order has been placed. patient requesting this to go to [facility]. Order faxed, and paitent provided with their number to follow up.

## 2020-06-17 NOTE — Progress Notes
[facility]	 [facility]	Medicine Progress NoteSickle cell serviceAttending Provider: Delphia Grates, MD [phone]:  6717/6717-AHospital Day #2 Subjective: Patient seen and examined this morning on sickle rounds.  Reports doing better and hoping to come off PCA and go home soon.Objective: Vitals:Most recent: Patient Vitals for the past 24 hrs: BP Temp Temp src Pulse Resp SpO2 06/17/20 0832 131/70 98.5 ?F (36.9 ?C) Oral 79 20 99 % 06/17/20 0613 136/82 98.5 ?F (36.9 ?C) Oral 83 18 100 % 06/16/20 2355 127/80 99 ?F (37.2 ?C) Oral (!) 97 18 98 % 06/16/20 2024 111/65 98.8 ?F (37.1 ?C) Oral (!) 91 18 97 % 06/16/20 1512 118/78 98 ?F (36.7 ?C) -- (!) 92 18 95 % Physical Exam  GEN:  Pt in bed, NAD.  A&Ox3HEENT: MMs moist, OP clear.  NC/AT.PULM: No tenderness, no resp distress.  No nasal cannula. Speaking in full sentencesABD: Soft, N/D.VASC: No LE edema B/LSKIN: Warm and dry.  No rashes, erythema, pallor, ecchymosis.PSYCH: Appropriate mood, affect, and behaviorAccess:  PIVI have reviewed new labs and imaging over the last 24 hoursLabs:Recent Labs Lab 12/11/210411 12/13/211827 12/14/210506 12/15/210619 NA 139 134* 133* 137 K 3.6 4.1 4.0 3.7 CL 102 98 98 102 CO2 24 24 24 23  BUN 10 12 12 10  CREATININE 0.77 0.68 0.60 0.66 GLU 122* 115* 129* 132* CALCIUM 9.3 9.2 9.0 9.2 Recent Labs Lab 12/11/210411 12/13/211827 12/14/210506 12/15/210619 WBC 13.9* 17.8* 15.4* 14.7* HGB 7.4* 7.3* 8.0* 8.6* HCT 20.60* 20.90* 22.70* 24.30* PLT 414 470* 437* 544* MCV 102.0* 101.0* 97.8 95.3 NEUTROPHILS 64.6 78.6* 70.4 60.6 MONOCYTES 13.6* 12.5* 15.8* 13.2* No results for input(s): LABPROT, INR, PTT, DDIMER, FIBRINOGEN in the last 168 hours.Recent Labs Lab 12/11/210411 12/13/211827 ALKPHOS 76 100 BILITOT 2.5* 2.9* BILIDIR 0.2 0.4* PROT 7.4 7.5 ALT 26 21 AST 58* 31  Diagnostics: No results found.Assessment: 24 y.o. male PMHx sickle cell disease admitted with chest pain.  With sickle cell pain crisis, mild acute chest syndrome, and community acquired pneumonia.Plan: Sickle cell pain crisis- D/C dilaudid PCA- Continue oxycodone oral tier- Continue standing ketorolac- Continue hydroxyurea- PRN benadryl and zofran for narcotic side effect managementAcute chest syndrome with pneumonia- S/P 2U pRBC with improvement in sx- Narrow abx to ceftin/doxy for CAP coverageBowel regimen- LBM 12/13- Encourage use of sennaF/E/N:- PO fluids- Electrolytes stable- Regular dietDVT Prophylaxis: lovenoxMobility:  IndependentMed Rec: Completed previously, reviewedDispo: To home pending improved pain, likely in AMFull Code/ACLSSigned:Hannah Alesia Banda Available on Mobile Heartbeat 0830-18001:32PMOn 06/17/2020 I interviewed and examined the patient, and I endorse the above. My role was evaluation of discharge readiness. We will hold PCA during the day, re-evaluate at 3 PM. If pain control adequate on oral tier, candidate for discharge (as patient requested). I have Rx'd outpatient opioids. Patient is to call clinic for a return to clinic appt. Delphia Grates, MD

## 2020-06-17 NOTE — Other
PHARMACY-ASSISTED MEDICATION REPORTPharmacist review of the best possible medication history obtained by the pharmacy medication history technician has been performed.  I have updated the home medication list and identified the following information that may be relevant to this admission.NOTES/RECOMMENDATIONS None at this time.       Prior to Admission Medications Medication Name Sig Taking? Patient Reported   hydroxyurea (HYDREA) 500 mg capsuleLast dose:  --  3 tab daily for blood. Yes     ibuprofen (ADVIL,MOTRIN) 600 mg tabletLast dose:  --  Up to 4 tablets daily as needed for pain. Yes     Miscellaneous Medical SupplyLast dose:  --  Heel lift Left shoeUse as directed.      **Flagged for Removal** naloxone (NARCAN) 4 mg/actuation sprayLast dose:  --  Use 1 spray in 1 nostril for suspected opioid overdose. May repeat in 2 minutes in other nostril with new device if minimal or no response. Yes     oxyCODONE (ROXICODONE) 10 mg Immediate Release tabletLast dose:  --  1 - 3 tablets every 3 h as needed for pain. Yes     oxyCODONE myristate (XTAMPZA ER) 9 mg 12 hr extended release sprinkle capsuleLast dose:  --  Take 1 capsule (9 mg total) by mouth every 12 (twelve) hours. Long-acting medicine to control pain. 340b. D57.1. For prior authorization Fax [phone]. Yes     senna (SENOKOT) 8.6 mg tabletLast dose: Not Taking at Unknown timeLast Medication Note: >> Melonie [place] Jun 15, 2020 11:53 PMMedHx Tech(Coretta Hines-Campbell, CPHT): FLAG FOR REMOVAL -  Confirmed not taking with the patientEntered by Riccardo Dubin, CPHT Mon Jun 15, 2020 2353 Take 2 tablets (17.2 mg total) by mouth every 3 (three) hours as needed for constipation.Patient not taking: Reported on 02/11/2020      **Flagged for Removal** Prior to admission medications last reviewed by Huel Coventry, MD on Tue Jun 16, 2020 0124 Thank Rueben Bash, PharmD12/14/202110:28 PMPhone: [facility]

## 2020-06-17 NOTE — Plan of Care
Plan of Care Overview/ Patient Status    1900-0700Pt is A&O X4VSS ex Tachycardia (HR maintained in the 90's during shift, but can run during the low 100's) on RAPt c/o of generalized pain (mostly in his back and chest). Continuous PCA maintained + Oxy tier Q3H given with good effect. OOB independently with steady gait / no BM during shift (refusing bowel regimen. Education provided)Meds given per MAR Hourly rounding, call bell within reach, see flowsheet for more,WCTMProblem: Adult Inpatient Plan of CareGoal: Plan of Care ReviewOutcome: Interventions implemented as appropriateGoal: Patient-Specific Goal (Individualized)Outcome: Interventions implemented as appropriateGoal: Absence of Hospital-Acquired Illness or InjuryOutcome: Interventions implemented as appropriateGoal: Optimal Comfort and WellbeingOutcome: Interventions implemented as appropriateGoal: Readiness for Transition of CareOutcome: Interventions implemented as appropriate Problem: Impaired Wound HealingGoal: Optimal Wound HealingOutcome: Interventions implemented as appropriate Problem: Fall Injury RiskGoal: Absence of Fall and Fall-Related InjuryOutcome: Interventions implemented as appropriate Problem: InfectionGoal: Absence of Infection Signs and SymptomsOutcome: Interventions implemented as appropriate

## 2020-06-17 NOTE — Plan of Care
Plan of Care Overview/ Patient Status    SOCIAL WORK ASSESSMENTPatient Name: Phillip RiceMedical Record Number: [id number] Date of Birth: 1997/01/26Social Work Assessment Adult    Most Recent Value Rendered Accommodations (Leave blank if none rendered or patient supplied their own hearing devices/glasses) Other language interpreter used (non-ASL)? No Admission Information Document Type Clinical  Assessment (For Inpatient/ED Only) Prior psychosocial assessment has been documented within this hospitalization No (For Inpatient/ED Only) Prior psychosocial assessment has been documented within 30 days of this hospitalization No Reason for Encounter Psycho-Social Concerns Visitor Restriction in Place No Intervention Psycho-social Intervention Psycho-social Intervention Empathy, Validation Source of Information Patient, Medical Team Medical Team Comment Sickle Cell Team Record Reviewed Yes Level of Care Inpatient Assessment has been completed within 30 days of this encounter (For Inpatient/ED Only) Prior psychosocial assessment has been documented within 30 days of this hospitalization No Patients Legal Contacts Legal Custody Status Self    Custody Status Comment Not applicable Past/Current Department of Children and Families Involvement No Legal Admission Status None    Legal Admission Status Comment  Not applicable Legal/Judicial Status None    Legal/Judicial Status Comment Not applicable Criminal Activity/Legal Involvement Pertinent to Current Situation/Hospitalization Not applicable Currently on Probation / Parole? No Parole / Probation Officer's Name Not applicable Parole / Research officer, trade union Not Sport and exercise psychologist / Nature conservation officer and Fax Number Not applicable Pending Court Dates, Technical sales engineer Charges Not applicable Horticulturist, commercial)  none Probate Court Granted Conservatorship No Conservator Notified of Admission No Advance Directive Has Advance Directive? No. Reason the patient will/did not complete an Advance Directive during the Inpatient stay patient declined Legal Permission Granted to Share Information  No Legal Comment Not applicable Involuntary Medication Hearing Will the patient have an Involuntary Medication Hearing? No Language needed None, Patient Speaks [place] Current Providers and Community Involvement Current Providers and Community Involvement A-L None Current Providers and Community Involvement M-Z Primary Care Physician Primary Care Physician Practice Name [facility] Primary Care Physician Provider Name J. Richardson Dopp, APRN and Marcene Brawn, MD Primary Care Physician Phone Number [phone] Prior to admission comment Not applicable Relationships Marital Status Single Adult Significant Relationships Mother, Father, Significant other Family circumstances Parents are primary support Quality of Family Relationships Supportive, Nurturing Support System No concerns Noted Separation/Losses (recent): No Lives With Mother, Father Sources of Support significant other, friend(s) Sexual history pertinent to current situation/hospitalization none Need for family/caregiver participation in care no Primary Family Member / Social Support Expectations of Care Not applicable Temporary Family Living Arrangements (While Hospitalized) none needed Relationship Comments Not reported Abuse Screen (yes response referral indicated) Able to respond to abuse questions Yes Do you Feel That You Are Treated Well By Your Partner/Spouse/Family Member/Caregiver/Employer?  yes What Happens When You Argue/Fight With Your Partner/Spouse/Family Member? Not reported Feels Unsafe at Home or Work/School no Feels Threatened by Someone no Does Anyone Try to Keep You From Having Contact with Others or Doing Things Outside Your Home? no Do you have concerns regarding someone you know having access to your MyChart account? no Physical Signs of Abuse Present no Physical Indicators of Abuse No evidence of physical abuse Mandated Referral Mandated Report Required no Mandated referral comment Not applicable Abuse/Trauma History Abuse/trauma history pertinent to current situation/hospitalization Deferred Physical/Sexual Abuse Perpetration Not reported Other Sexually Reactive Behaviors Not reported Education - Adult Education - Adult completed college Current Education Enrollment None Literacy Read/write independently Special Needs Not reported Employment/Income/Finance/Insurance Military Experience No Financial Concerns Identified No Financial Barriers  to accessing medical care  None Employment Status Not employed Housing / Programmer, multimedia for the past 2 months Apartment Housing-Related Financial Concerns None Able to Return to Prior Arrangements yes Able to [employer] Yes Housing-Related Environmental Concerns No concerns Has utility company threatened to shut off services? No Food Availability No Concerns Needs healthcare-related transportation No Healthcare-related transportation concerns No Parking charges are accruing No Housing/Transportation/Environmental Comment Not applicable Mental Status Observation of Mental Status has identified Notable Findings No History of Psychiatric Illness/Diagnosis Not reported Suicide Risk Assessment Reason for Assessment Utilizing SAFE-T and C-SSRS (Check all that apply) Social Work Consult/Assessment C-SSRS Able to Assess Screening for suicidal ideation within Past Month C-SSRS #1: Have you wished you were dead or wished you could go to sleep and not wake up? (If Yes, answer #3 - #5) No C-SSRS #2: Have you actually had any thoughts of killing yourself? (If Yes, answer #3 - #5) No C-SSRS #6: Have you ever done anything, started to do anything or prepared to do anything to end your life? (Suicidal behavior over your lifetime) No Specific Questions about Thoughts, Plans, Suicidal Intent (SAFE-T) Negative responses above do not indicate a need for SAFE-T assessment Risk Assessment Risk Assessment Able to Assess Risk to Self Able to Assess Risk to Self - Self-Injurious Behavior None identified Attitudes regarding Self-Injury None disclosed Imminent Risk for Self-Injury in Community Low Imminent Risk for Self-Injury in Facility Low Risk to Others Able to Assess Risk to Others None Disclosed Attitude regarding Aggression / Violence None Disclosed Imminent Risk for Violence in Community Low Imminent Risk for Violence in Facility Low Current and Past Psychiatric Diagnoses Able to Assess Mood Disorder No Anxiety Disorder No Psychotic Disorder No Substance Use Disorder No Post-Traumatic Stress Disorder (PTSD) No Attention Deficit/Hyperactivity Disorder (ADHD) No Traumatic Brain Injury (TBI) No Cluster B Personality Disorders or Traits (i.e. Borderline, Antisocial, Histrionic & Narcissistic) No Conduct Problems (Antisocial Behavior, Aggression, Impulsivity) No Suicide Attempt No Prior Attempts Presenting Symptoms None Family History None reported Precipitants/Stressors None Identified Change in Mental Health or Substance Use Disorder Treatment Not applicable Historical Risk Factors None Protective Factors Able to Assess Protective Factors - Internal Future oriented, Ability to cope with stress, Frustration tolerance, Problem solving skills, Identifies reasons for living Protective Factors - External Engaged in work or school, Supportive social network of friends or family This patient was screened using the [place] Suicide Severity Rating Scale (CSSRS)  Yes I conducted a suicide risk assessment including a suicide inquiry and assessment of risk and protective factors, as recommended by the standard Suicide Assessment Five-Step Evaluation and Triage (SAFE-T) for Mental Health Professionals. No, the C-SSRS did not produce a positive screen Cause for concern None Based on my assessment, the level of risk for this patient to suicide in an inpatient or emergency setting is:  MINIMAL because the patient does not present with suicidal ideation, does not have a history of suicide attempts, and the balance of protective factors outweighs any current risk factors Based on my assessment, the level of risk for this patient to suicide in the community is:  MINIMAL Recommended Next Steps Remain in/Return to Community Remain in/Return to [employer] The benefit to the patient of remaining in the community is The benefit to the patient of remaining in the community is Engagement with family/friends, Maintaining place in society, Retaining some control of treatment Substance Use Substances Used Reports none Active substance use Reports none Exposure to [employer] none Previous Substance  Use Treatment none Alcohol Use Alcohol Amount Per Day In Past Year 0-->none Substance Use, Caregiver Caregiver Substance Use Unable to Assess FICA Spiritual Assessment Tool Need for spiritual support  Yes Permission to notify clergy No Spiritual Care Comment Not applicable Medically Ready for Discharge Is this patient medically ready for discharge? No Needs Assessment  Concerns to be Addressed no needs identified Concerns Comments Not applicable Readmission Within the Last 30 Days no previous admission in last 30 days Needs in the Community none Anticipated Facility/Agency/Outpatient/Support Group Need(s)  none Home Health Care Services Required N/A Equipment Needed After Discharge none Assessment Needs Comment Not applicable Narrative/Signoff Identified Clinical/Disposition, Issues/Barriers: Sickle Cell Pain crisis Intervention(s)/Summary 30 minutes were spent face to face with Mr. Ferrara. Although her was distressed due to the sickle cell pain crisis, he was engaged and cooperative during this assessment. I introduced myself and I explained to him about my role, I provided my contact information and I encouraged him to contact me as needed. Mr. Markoff shared ?I am in pain, but feeling better, I want to go home soon?. I provided active listening, I validated his feelings and I encouraged him to share with the team his desire to go home, he agreed. We discussed short term disability information and possibility of applying, Mr. Anzalone is to contact me and/or the clinic shall he has any additional questions about the application process. I will discuss with the team and I will remain available as needed. Collaboration with Treatment Team/Community Providers/Family: Sickle Cell Team Referrals/Resources Provided: None Outcome Resolved Handoff Required? No Next Steps/Plan (including hand-off): Social work intervention completed at this time. Please re-consult social work as needed. Signature: Shaune Pollack, LCSW Contact Information: [phone]

## 2020-06-17 NOTE — Telephone Encounter
Hi,Patient called today and is requesting a script for PT. Please call patient at [phone].

## 2020-06-17 NOTE — Plan of Care
Problem: Adult Inpatient Plan of CareGoal: Plan of Care ReviewOutcome: Interventions implemented as appropriate Plan of Care Overview/ Patient Status    0700-1900A/Ox4, VSS, RA. Skin intact. Generalized pain. IND OOB. LBM 12/13. Regular diet, pills whole. Oxy tier. VS q4. Care provided, rounding performed, bed alarm set.

## 2020-06-17 NOTE — Progress Notes
[facility]	 [facility]	Medicine Progress NoteSickle cell serviceAttending Provider: Delphia Grates, MD [phone]:  6717/6717-AHospital Day #1 Subjective: Patient seen and examined this morning on sickle rounds.  Reports ongoing pleuritic chest pain and back pain.  Objective: Vitals:Most recent: Patient Vitals for the past 24 hrs: BP Temp Temp src Pulse Resp SpO2 Height Weight 06/16/20 1512 118/78 98 ?F (36.7 ?C) -- (!) 92 18 95 % -- -- 06/16/20 1332 120/69 98.9 ?F (37.2 ?C) Oral 88 16 97 % -- -- 06/16/20 1317 106/64 98.9 ?F (37.2 ?C) Oral (!) 93 16 98 % -- -- 06/16/20 1312 106/64 98.9 ?F (37.2 ?C) -- (!) 93 16 -- -- -- 06/16/20 1234 121/65 98.7 ?F (37.1 ?C) Oral 89 16 98 % -- -- 06/16/20 0840 119/63 (!) 100.1 ?F (37.8 ?C) Oral 86 16 -- -- -- 06/16/20 0534 117/69 99 ?F (37.2 ?C) Oral (!) 93 20 97 % -- -- 06/16/20 0201 123/77 99.1 ?F (37.3 ?C) Oral 87 17 97 % -- -- 06/16/20 0132 136/60 98.8 ?F (37.1 ?C) -- 87 20 -- -- -- 06/16/20 0100 -- -- -- -- -- -- 6' 1 (1.854 m) 64.4 kg 06/16/20 0057 139/84 99.2 ?F (37.3 ?C) -- (!) 105 -- 97 % -- -- 06/16/20 0032 123/70 98 ?F (36.7 ?C) Oral 90 16 99 % -- -- 06/15/20 2053 123/81 98.4 ?F (36.9 ?C) Oral 84 16 98 % -- -- 06/15/20 1900 -- -- -- -- -- -- -- 68.7 kg 06/15/20 1833 124/75 (!) 100.1 ?F (37.8 ?C) Oral (!) 102 18 98 % -- -- Physical Exam  GEN:  Pt in bed, NAD.  A&Ox3HEENT: MMs moist, OP clear.  NC/AT.CARD: RRR, S1S2, no murmurs.PULM: CTA B/L, no wheezes, rhonchi, or rales.  Breath sounds somewhat diminished throughout.  No tenderness, no resp distress.ABD: Soft, N/T, N/D, BS normoactive.VASC: No LE edema B/LSKIN: Warm and dry.  No rashes, erythema, pallor, ecchymosis.PSYCH: Appropriate mood, affect, and behaviorAccess:  PIVI have reviewed new labs and imaging over the last 24 hoursLabs:Recent Labs Lab 12/11/210411 12/13/211827 12/14/210506 NA 139 134* 133* K 3.6 4.1 4.0 CL 102 98 98 CO2 24 24 24  BUN 10 12 12  CREATININE 0.77 0.68 0.60 GLU 122* 115* 129* CALCIUM 9.3 9.2 9.0 Recent Labs Lab 12/11/210411 12/13/211827 12/14/210506 WBC 13.9* 17.8* 15.4* HGB 7.4* 7.3* 8.0* HCT 20.60* 20.90* 22.70* PLT 414 470* 437* MCV 102.0* 101.0* 97.8 NEUTROPHILS 64.6 78.6* 70.4 MONOCYTES 13.6* 12.5* 15.8* No results for input(s): LABPROT, INR, PTT, DDIMER, FIBRINOGEN in the last 168 hours.Recent Labs Lab 12/11/210411 12/13/211827 ALKPHOS 76 100 BILITOT 2.5* 2.9* BILIDIR 0.2 0.4* PROT 7.4 7.5 ALT 26 21 AST 58* 31  Diagnostics: CXRResult Date: 12/13/2021Small consolidation in the right lung base. These opacities in the left lung base. Small bilateral pleural effusions. Reported And Signed By: Forrestine Him, MD  [facility] Radiology and Biomedical ImagingAssessment: 24 y.o. male PMHx sickle cell disease admitted with chest pain.  With sickle cell pain crisis, mild acute chest syndrome, and community acquired pneumonia.Plan: Sickle cell pain crisis- Continue dilaudid PCA- Start oxycodone oral tier- Start standing ketorolac- Continue hydroxyurea- PRN benadryl and zofran for narcotic side effect managementAcute chest syndrome with pneumonia- S/P 1U pRBC overnight, give additional unit today- Narrow abx to ceftriaxone/doxy for CAP coverage- Check urine strep/legionella- Sputum culture if patient able to provideBowel regimen- LBM 12/13- Encourage use of sennaF/E/N:- IVF to accompany PCA- Electrolytes stable- Regular dietDVT Prophylaxis: lovenoxMobility:  IndependentMed Rec: Completed previously, reviewedDispo: To home pending improved painFull Code/ACLSSigned:Hannah Huston Foley  PA-C Available on Mobile [employer] [phone] 06/16/2020 I interviewed and examined the patient, and I endorse the above. My role was decision to switch to antiBx for community acquired pneumonia, transfuse total 2 u prbc's. Delphia Grates, MD

## 2020-06-18 DIAGNOSIS — D5701 Hb-SS disease with acute chest syndrome: Principal | ICD-10-CM

## 2020-06-18 DIAGNOSIS — J189 Pneumonia, unspecified organism: Secondary | ICD-10-CM

## 2020-06-18 DIAGNOSIS — Z79899 Other long term (current) drug therapy: Secondary | ICD-10-CM

## 2020-06-18 DIAGNOSIS — M879 Osteonecrosis, unspecified: Secondary | ICD-10-CM

## 2020-06-18 DIAGNOSIS — Z20822 Contact with and (suspected) exposure to covid-19: Secondary | ICD-10-CM

## 2020-06-18 DIAGNOSIS — K219 Gastro-esophageal reflux disease without esophagitis: Secondary | ICD-10-CM

## 2020-06-18 MED ORDER — HYDROMORPHONE 2 MG/ML INJECTION SOLUTION
2 mg/mL | Freq: Once | SUBCUTANEOUS | Status: CP
Start: 2020-06-18 — End: ?
  Administered 2020-06-18: 10:00:00 2 mL via SUBCUTANEOUS

## 2020-06-18 MED ORDER — CEFUROXIME AXETIL 500 MG TABLET
500 mg | ORAL_TABLET | Freq: Two times a day (BID) | ORAL | 1 refills | Status: AC
Start: 2020-06-18 — End: 2021-05-18

## 2020-06-18 MED ORDER — DOXYCYCLINE HYCLATE 100 MG TABLET
100 mg | ORAL_TABLET | Freq: Two times a day (BID) | ORAL | 1 refills | Status: AC
Start: 2020-06-18 — End: 2021-05-18

## 2020-06-18 NOTE — Plan of Care
Plan of Care Overview/ Patient Status    1900-0700Pt is A&Ox4, VSS on RA. No complaints of SOB or chest pain. Pt does complain of 6.5 out of 10, max rated an 8 out of 10, in his left lower back. No tenderness upon palpation, heat pack applied, and medicated per MAR. Tier given as needed throughout the night. At 0400 pt visible crying and unable to sleep due to pain, provider made aware, subQ dilaudid given. Pt is independent OOB, ambulating with cane in the hall. Continent of B/B. No BM reported during shift, pt still refusing senna. Call bell in reach. Medications given per MAR. Safety maintained.

## 2020-06-18 NOTE — Plan of Care
Plan of Care Overview/ Patient Status    VSS, AxO/4Sating comfortably on RAComplaining of pain to lower back medicated according to [facility] with good effectIndependent in roomIV removed+Bs/flatusVoiding in BRPt. Being DC home, DC instructions gone over @ bedside w. Pt.Pt. Brother to transport homePt. Agreeable to plan

## 2020-06-18 NOTE — Discharge Instructions
It was a pleasure being part of your care team at [facility]. Please call [phone] for questions for Dr. Su Hilt or Robbi Garter, PA-C.Continue taking doxycycline 100 mg by mouth every 12 hours through the next 2 days and Ceftin 500 mg by mouth every 12 hours through the next 3 days to complete total antibiotic course.

## 2020-06-18 NOTE — Discharge Summary
[facility] Discharge SummaryPatient Data:  Patient Name: Phillip Bell Admit date: 06/15/2020 Age: 24 y.o. Discharge date: 06/18/2020 DOB: 26-Jun-1996	 Discharge Attending Physician: Delphia Grates, MD  MRN: [id number]	 Discharged Condition: stable PCP: Delphia Grates, MD Disposition: Home  Principal Diagnosis: Sickle cell disease with crisis (HC Code) [facility] CODE)Other Active Diagnoses: Present on Admission: Sickle cell disease with crisis (HC Code) (HC CODE)  with mild acute chest syndrome and PNAIssues to be Addressed Post Discharge: Issues to be Addressed Post Discharge:Follow up in sickle cell clinicPending Labs and Tests: Pending Lab Results   Order Current Status  Fructosamine In process  Blood culture #1 Preliminary result  Blood culture #2 Preliminary result  Follow-up Information:No follow-up provider specified. Future Appointments Date Time Provider Department Center 07/13/2020  1:30 PM Nadene Rubins, APRN [facility] M 07/14/2020  2:30 PM Luis Abed, MD ORT [facility] Hospital Course: Hospital Course: 24 y/o M PMHx sickle cell disease presented to ED with chest and upper back pain.  Not relieved by home oxycodone and ibuprofen.  Fever in ED to 100.6 with new consolidation on CXR.  Given 2 doses of IV dilaudid in ED with minimal improvement so started on PCA and admitted.  Started on zosyn and vanco for CAP and given 1U pRBC for acute chest syndrome.Patient with persistent chest pain after 1U pRBC so given second unit with improvement in sx.  Abx narrowed to ceftriaxone/doxy for community acquired pneumonia.  Sx gradually improved.  Pain better and patient able to manage pain with oral medications alone.  Discharged to home in stable condition.Inpatient Consultants and summary of recommendations:NonePertinent Procedures or Surgeries:  NonePertinent lab findings and test results: Objective: Recent Labs Lab 12/13/211827 12/13/211827 12/14/210506 12/14/210506 12/15/210619 WBC 17.8*  --  15.4*  --  14.7* HGB 7.3*   < > 8.0*   < > 8.6* HCT 20.90*   < > 22.70*   < > 24.30* PLT 470*   < > 437*   < > 544*  < > = values in this interval not displayed.  Recent Labs Lab 12/13/211827 12/14/210506 12/15/210619 NEUTROPHILS 78.6* 70.4 60.6  Recent Labs Lab 12/13/211827 12/13/211827 12/14/210506 12/14/210506 12/15/210619 NA 134*  --  133*  --  137 K 4.1   < > 4.0   < > 3.7 CL 98   < > 98   < > 102 CO2 24   < > 24   < > 23 BUN 12   < > 12   < > 10 CREATININE 0.68   < > 0.60   < > 0.66 GLU 115*   < > 129*   < > 132* ANIONGAP 12   < > 11   < > 12  < > = values in this interval not displayed.  Recent Labs Lab 12/13/211827 12/14/210506 12/15/210619 CALCIUM 9.2 9.0 9.2  Recent Labs Lab 12/11/210411 12/11/210411 12/13/211827 ALT 26  --  21 AST 58*   < > 31 ALKPHOS 76   < > 100 BILITOT 2.5*   < > 2.9* BILIDIR 0.2   < > 0.4*  < > = values in this interval not displayed.  No results for input(s): PTT, LABPROT, INR in the last 168 hours. Imaging: Imaging results last 1 week:  CXRResult Date: 12/13/2021Small consolidation in the right lung base. These opacities in the left lung base. Small bilateral pleural effusions. Reported And Signed By: Forrestine Him, MD  [facility] Radiology and Biomedical ImagingCXRResult  Date: 12/11/2021Left basilar atelectasis. Otherwise no obvious pulmonary consolidation.. Report Initiated By:  Tessie Eke, RRA Reported And Signed By: Park Meo, MD  [facility] Radiology and Biomedical ImagingDiet:  Regular Mobility: Highest Level of mobility - ACTUAL: Mobility Level 7, Walk 25+ feet, AM PAC 22-23PT Disposition Recommendation:  N/A as patient independent OOB with cane Physical Exam Discharge vitals: Temp:  [98.1 ?F (36.7 ?C)-98.8 ?F (37.1 ?C)] 98.6 ?F (37 ?C)Pulse:  [83-94] 86Resp:  [16-19] 19BP: (132-139)/(70-84) 132/72SpO2:  [97 %-99 %] 98 %Device (Oxygen Therapy): room air Discharge Physical Exam:GEN:  Pt in bed, NAD.  A&Ox3HEENT: MMs moist, OP clear.  NC/AT.CARD: RRR, S1S2, no murmurs.PULM: CTA B/L, no wheezes, rhonchi, or rales.  ABD: Soft, N/T, N/D, BS normoactive.VASC: No LE edema B/LSKIN: Warm and dry.  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 Bernita Raisin  Social History Family History Social History Tobacco Use  Smoking status: Never Smoker  Smokeless tobacco: Never Used Substance Use Topics  Alcohol use: Yes   Comment: beer and liquor on occasion  Family History Problem Relation Age of Onset  Sickle cell trait Mother   Thyroid disease Mother   Sickle cell trait Father   Sarcoidosis Father   Diabetes Paternal Grandfather   Hypertension Paternal Grandfather   Alcohol abuse Maternal Aunt   Discharge Medications: Discharge: Current Discharge Medication List  START taking these medications  Details cefuroxime (CEFTIN) 500 mg tablet Take 1 tablet (500 mg total) by mouth every 12 (twelve) hours.Qty: 5 tablet, Refills: 0Start date: 06/18/2020  doxycycline hyclate (VIBRA-TABS) 100 mg tablet Take 1 tablet (100 mg total) by mouth every 12 (twelve) hours.Qty: 4 tablet, Refills: 0Start date: 06/18/2020   CONTINUE these medications which have NOT CHANGED  Details hydroxyurea (HYDREA) 500 mg capsule 3 tab daily for blood.Qty: 90 capsule, Refills: 2  naloxone (NARCAN) 4 mg/actuation spray Use 1 spray in 1 nostril for suspected opioid overdose. May repeat in 2 minutes in other nostril with new device if minimal or no response.Qty: 2 each, Refills: 1  ibuprofen (ADVIL,MOTRIN) 600 mg tablet Up to 4 tablets daily as needed for pain.Qty: 100 tablet, Refills: 1Start date: 06/17/2020  Miscellaneous Medical Supply Heel lift Left shoeUse as directed.Qty: 1 each, Refills: 0  oxyCODONE (ROXICODONE) 15 mg Immediate Release tablet 1 - 3 tablets every 3 h as needed for pain.Qty: 60 tablet, Refills: 0Start date: 06/17/2020  Associated Diagnoses: Hb-SS disease without crisis (HC Code) (HC CODE)  oxyCODONE myristate (XTAMPZA ER) 13.5 mg 12 hr extended release sprinkle capsule Take 1 capsule (13.5 mg total) by mouth every 12 (twelve) hours. Long-acting medicine to control pain. 340b. D57.1. For  prior authorization Fax [phone].Qty: 28 capsule, Refills: 0Start date: 06/17/2020  senna (SENOKOT) 8.6 mg tablet Take 2 tablets (17.2 mg total) by mouth every 3 (three) hours as needed for constipation.Qty: 30 tablet, Refills: 0   Electronically Signed:Erika Hinda Glatter, PA-C12/16/2021 1:27 PMBest Contact Information: [phone] 06/18/2020 I interviewed and examined the patient, and I endorse the above. My role was decision to discharge, plans for outpatient meds and return visits. Delphia Grates, MD

## 2020-06-21 LAB — BLOOD CULTURE
BKR BLOOD CULTURE: NO GROWTH
BKR BLOOD CULTURE: NO GROWTH

## 2020-06-22 ENCOUNTER — Ambulatory Visit
Admit: 2020-06-22 | Payer: BLUE CROSS/BLUE SHIELD | Attending: Family | Primary: Student in an Organized Health Care Education/Training Program

## 2020-06-22 ENCOUNTER — Inpatient Hospital Stay
Admit: 2020-06-22 | Discharge: 2020-06-22 | Payer: BLUE CROSS/BLUE SHIELD | Primary: Student in an Organized Health Care Education/Training Program

## 2020-06-22 ENCOUNTER — Encounter
Admit: 2020-06-22 | Payer: PRIVATE HEALTH INSURANCE | Attending: Family | Primary: Student in an Organized Health Care Education/Training Program

## 2020-06-22 DIAGNOSIS — S72362A Displaced segmental fracture of shaft of left femur, initial encounter for closed fracture: Secondary | ICD-10-CM

## 2020-06-22 DIAGNOSIS — Z8701 Personal history of pneumonia (recurrent): Secondary | ICD-10-CM

## 2020-06-22 DIAGNOSIS — R079 Chest pain, unspecified: Secondary | ICD-10-CM

## 2020-06-22 DIAGNOSIS — D571 Sickle-cell disease without crisis: Secondary | ICD-10-CM

## 2020-06-23 ENCOUNTER — Encounter
Admit: 2020-06-23 | Payer: PRIVATE HEALTH INSURANCE | Primary: Student in an Organized Health Care Education/Training Program

## 2020-06-23 LAB — FRUCTOSAMINE: FRUCTOSAMINE: 282 umol/L (ref 205–285)

## 2020-06-23 NOTE — Progress Notes
SOCIAL WORK NOTEPatient Name: Marguerite RiceMedical Record Number: [id number] Date of Birth: 10/23/1997Social Work Follow Up    Most Recent Value Document Type Progress Note Reason for Encounter Psycho-Social Concerns Intervention Psycho-social Intervention Psycho-social Intervention Clarifying and Identifying, Empathy, Validation Source of Information Patient Record Reviewed Yes Level of Care Ambulatory Identified Clinical/Disposition, Issues/Barriers: I met with Raequan to assess needs and offer support. Intervention(s)/Summary This entry is documentation for 06/22/2020. 30 minutes were spent face to face with [name] during clinic visit. He expressed feeling fine. I validated and normalized his feelings. I assessed his functioning and management of sickle cell which he reports concerns related to working outdoors as the weather begins to change and how that may impact him. We will provide documentation for his employer to support his request for job reassignment. 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 He is looking forward to visiting family in [place] over the holidays. He did not identify any psychosocial concerns. I encouraged him to call me as needed. Collaboration with Treatment Team/Community Providers/Family: Sickle cell team Outcome Resolved Handoff Required? No Next Steps/Plan (including hand-off): Tayvin agreed to contact the clinic for support as needed. Social work intervention complete. Signature: Alphonzo Dublin, LMSW Contact Information: Mobile [phone]

## 2020-06-24 NOTE — ED Provider Notes
HistoryChief Complaint Patient presents with ? Sickle Cell Pain  24 y.o. male with a PMH of sickle cell disease who presents with thoracic back and chest pain since 06/13/20. He came to the ED with pain on 12/11 which improved with dilaudid and toradol. He was sent home but reported that the pain has not improved and is getting worse. He has taken ibuprofen and oxycodone 10 mg with no relief. He has been prescribed long acting oxycodone at home which he does not take as he does not think it works. He reports that has his usual crises involves thoracic back pain.  Denies SOB, palpitations, abdominal pain, nausea, vomiting, fever.? Past Medical History: Diagnosis Date ? Aplastic crisis (HC Code) (HC CODE) 6/6/ 2005 transfusion ? Avascular necrosis of femur head, left (HC Code) (HC CODE)  ? Chronic pain   sickle cell ? Conductive hearing loss 09/22/14 P.E tubes placed ? Dactylitis one episode  April, 1998 ? GERD (gastroesophageal reflux disease)  ? 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 On hydroxyurea therapy started October 2013 ? Pneumococcal vaccination given 01/20/2006 and 09/28/2012 ? 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 Vasoocclusive sickle cell crisis (HC Code) (HC CODE) Adm 01/02/2012, ER 04/08/12, 4/1-10/03/12, 7/2-01/05/13, 12/02/13-12/03/13 , March 2016 - 2 admissions Past Surgical History: Procedure Laterality Date ? CHOLECYSTECTOMY, LAPAROSCOPIC  1/15 ? TYMPANOSTOMY TUBE PLACEMENT  09/22/2014  by Dr. Bernita Raisin Family History Problem Relation Age of Onset ? Sickle cell trait Mother  ? Thyroid disease Mother  ? Sickle cell trait Father  ? Sarcoidosis Father  ? Diabetes Paternal Grandfather  ? Hypertension Paternal Grandfather  ? Alcohol abuse Maternal Aunt  Social History Socioeconomic History ? Marital status: Single   Spouse name: Not on file ? Number of children: Not on file ? Years of education: Not on file ? Highest education level: Not on file Tobacco Use ? Smoking status: Never Smoker ? Smokeless tobacco: Never Used Substance and Sexual Activity ? Alcohol use: Yes   Comment: beer and liquor on occasion ? Drug use: Yes   Types: Marijuana   Comment: last used on Tuesday ? Sexual activity: Yes Social History Narrative  Lives in [place].    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  Will get a Masters, MBA in business  [facility] Other Social History E-cigarette/Vaping Substances E-cigarette/Vaping Devices Review of Systems Constitutional: Negative.  HENT: Negative.  Eyes: Negative.  Respiratory: Negative.  Cardiovascular: Positive for chest pain. Genitourinary: Negative.  Musculoskeletal: Positive for back pain. Neurological: Negative.  Hematological: Negative.  Psychiatric/Behavioral: Negative.   Physical ExamED Triage Vitals [06/15/20 1555]BP: 130/81Pulse: (!) 113Pulse from  O2 sat: n/aResp: 18Temp: (!) 100.6 ?F (38.1 ?C)Temp src: OralSpO2: 97 % BP 132/72  - Pulse 86  - Temp 98.6 ?F (37 ?C) (Oral)  - Resp 19  - Ht 6' 1 (1.854 m)  - Wt 64.4 kg  - SpO2 98%  - BMI 18.73 kg/m? Physical ExamHENT:    Head: Normocephalic and atraumatic. Eyes:    Pupils: Pupils are equal, round, and reactive to light. Cardiovascular:    Rate and Rhythm: Normal rate and regular rhythm.    Pulses: Normal pulses.    Heart sounds: No murmur heard. Pulmonary:    Effort: Pulmonary effort is normal.    Breath sounds: Normal breath sounds. Chest:  Chest wall: Tenderness present. Abdominal:    General: There is no distension.    Palpations: Abdomen is soft.    Tenderness: There is no abdominal tenderness. Musculoskeletal:       General: Tenderness present. No swelling or deformity.    Cervical back: Normal range of motion. Neurological:    General: No focal deficit present.    Mental Status: He is alert and oriented to person, place, and time. Psychiatric:       Mood and Affect: Mood normal.  ProceduresProceduresResident/APP MDM:24 yo with sickle cell disease who presents with worsening acute pain in his chest and upper back.DDx: Sickle cell crisis vs acute chest syndrome vs infection (low grade temp, leukocytosis)Plan:- dilaudid 1 mg IV x 3, toradol 15 mg per pain plan- Basic labs, Retics, CXR, UA, blood cx- NS 500 cc8:54 pmCXR with new bibasilar opacities. Labs notable for leukocytosis to 17.DDx: acute chest syndrome vs PNAPlan:- Start empiric Vanc/Zosyn/Doxycycline- Pain mx: Dilaudid PCA per pain plan- Admit to medicine10:47 pm Case discussed with Hematology on call fellow.- Recommend 1u RBC transfusion; repeat CBC post transfusion. Give additional unit if Hb<9g/dl. Do not transfuse for greater than 10 g/dl due to increased risk of hyperviscosity.- Sputum cx if possible, f/u blood cx, viral panel. - Pending admission to Medicine (EP 6-7 preferred).Earma Reading, MDED COURSEReviewed previous: labsPatient Reevaluation: Patient Vitals in the past 24 ZOX:[id number] Supervised: ResidentI saw and examined the patient. I agree with the findings and plan of care as documented in the resident's note. Of note  patient with known sickle cell disease who was recently seen for an acute pain episode and discharged home.  He returns today stating that his outpatient therapy is not working.  He has pain is his usual events with some pain in his chest as well as extremities.  He denies any fever.  He denies any cough or shortness of breath.EXAMGen: Alert and in mild distress due to painHead: NC / ATEyes: conj clear without any injectionNeck: supple, no JVD / masses / LANLungs: CTA bilaterallyCV: RRR, no murmursABD: soft, NT / ND, no masses / HSMBack:  NTTP. No CVATNeuro: non-focal, moving all 4 with normal strength / sensationSkin: no rash / petechiaeMedical Decision MakingThis pt has known sickle cell disease with pain episode.  We will evaluate basic laboratory testing and use his pain management plan.Please call me on mobile heartbeat with any questions: [phone] Della-Giustina, MDAttending Physician, Emergency MedicineED course:  Patient had fever on ED arrival.  Based on this we had blood cultures and treated presumptively with broad-spectrum antibiotics.Patient did not get better with his prescribed pain regimen and will admit to the hospital for further management.We discussed the case with Hematology on-call and they recommended transfusion with blood.  We have ordered this and admit him to the hospital.Chest x-ray shows infiltrate and with symptoms making be consistent with pneumonia versus acute chest syndrome.Patient has been stable in the ED.Critical care provided by attending: critical careCritical care time (minutes): 30.Critical care time was exclusive of separately billable procedures and teaching time.Critical care was necessary to treat or prevent imminent or life-threatening deterioration of the following conditions:  Sepsis and circulatory failure.Critical care was time spent personally by me on the following activities: ordering and review of laboratory studies, ordering and review of radiographic studies, pulse oximetry, re-evaluation of patient's condition, review of old charts, discussions with consultants, development of treatment plan with patient or surrogate, examination of patient, obtaining history from patient or surrogate and ordering and performing treatments and interventions.Patient  progress: stableClinical Impressions as of Jun 24 1650 Sickle cell disease with crisis [facility] Code) [facility] CODE) Fever, unspecified fever cause  ED DispositionAdmit Earma Reading, MDResident12/13/21 2309 Della-GiustinaOnalee Hua, MD12/22/21 1650 Pauline Aus, MD12/22/21 1651

## 2020-07-02 ENCOUNTER — Inpatient Hospital Stay
Admit: 2020-07-02 | Discharge: 2020-07-02 | Payer: BLUE CROSS/BLUE SHIELD | Primary: Student in an Organized Health Care Education/Training Program

## 2020-07-02 ENCOUNTER — Encounter
Admit: 2020-07-02 | Payer: PRIVATE HEALTH INSURANCE | Attending: Internal Medicine | Primary: Student in an Organized Health Care Education/Training Program

## 2020-07-02 DIAGNOSIS — Z20828 Contact with and (suspected) exposure to other viral communicable diseases: Secondary | ICD-10-CM

## 2020-07-05 ENCOUNTER — Encounter
Admit: 2020-07-05 | Payer: PRIVATE HEALTH INSURANCE | Primary: Student in an Organized Health Care Education/Training Program

## 2020-07-05 LAB — COVID-19 CLEARANCE OR FOR PLACEMENT ONLY: BKR SARS-COV-2 RNA (COVID-19) (YH): DETECTED — AB

## 2020-07-05 NOTE — Other
Patient mychart message sent to follow-up on positive result.

## 2020-07-06 ENCOUNTER — Encounter
Admit: 2020-07-06 | Payer: PRIVATE HEALTH INSURANCE | Attending: Hematology & Oncology | Primary: Student in an Organized Health Care Education/Training Program

## 2020-07-09 ENCOUNTER — Encounter
Admit: 2020-07-09 | Payer: PRIVATE HEALTH INSURANCE | Attending: Family | Primary: Student in an Organized Health Care Education/Training Program

## 2020-07-09 ENCOUNTER — Telehealth
Admit: 2020-07-09 | Payer: PRIVATE HEALTH INSURANCE | Attending: Family | Primary: Student in an Organized Health Care Education/Training Program

## 2020-07-09 DIAGNOSIS — D571 Sickle-cell disease without crisis: Secondary | ICD-10-CM

## 2020-07-09 MED ORDER — OXYCODONE IMMEDIATE RELEASE 15 MG TABLET
15 mg | ORAL_TABLET | 1 refills | Status: AC
Start: 2020-07-09 — End: 2020-08-06

## 2020-07-09 NOTE — Telephone Encounter
Pt called requesting med refills on both Oxycodone 15 MG + Hydrea 500 MG cap. Pt states he is currently out of Oxy, has 3 day supply left of Hydrea, confirmed pharmacy we have on file. Will route to nurse team to advise.

## 2020-07-09 NOTE — Telephone Encounter
Return call to Patient Phillip Bell. Informed him that he has 2 refills left on his Hydroxyurea. Then I wanted to clarify exactly which Oxy he needs refilled because his has the 15 mg Oxycodone IR and the 13.5 ER. He stated he just needs the 15 mg Oxycodone. He doesn't take the 13.5 ER because he said it upsets his stomach. I will inform APRN and ask her to refill the IR OXY. [facility] [place] on [address]. Patient verbalized understanding.

## 2020-07-13 ENCOUNTER — Encounter
Admit: 2020-07-13 | Payer: PRIVATE HEALTH INSURANCE | Primary: Student in an Organized Health Care Education/Training Program

## 2020-07-13 ENCOUNTER — Encounter
Admit: 2020-07-13 | Payer: PRIVATE HEALTH INSURANCE | Attending: Hematology & Oncology | Primary: Student in an Organized Health Care Education/Training Program

## 2020-07-13 ENCOUNTER — Ambulatory Visit
Admit: 2020-07-13 | Payer: PRIVATE HEALTH INSURANCE | Attending: Family | Primary: Student in an Organized Health Care Education/Training Program

## 2020-07-13 ENCOUNTER — Ambulatory Visit
Admit: 2020-07-13 | Payer: BLUE CROSS/BLUE SHIELD | Attending: Family | Primary: Student in an Organized Health Care Education/Training Program

## 2020-07-13 DIAGNOSIS — D571 Sickle-cell disease without crisis: Secondary | ICD-10-CM

## 2020-07-13 NOTE — Progress Notes
SOCIAL WORK NOTEPatient Name: Phillip RiceMedical Record Number: [id number] Date of Birth: [birth year]Social Work Follow Up    Most Recent Value Document Type Progress Note Reason for Encounter Psycho-Social Concerns Intervention Geophysicist/field seismologist Source of Information Patient, Medical Team Medical Team Comment Nadene Rubins, APRN, Short, clinic registration staff Record Reviewed Yes Level of Care Ambulatory Identified Clinical/Disposition, Issues/Barriers: My chart video Intervention(s)/Summary 10 minutes were spent on the phone with Phillip Bell. I called to inform him that per administration due to increase in Covid-19 cases we will need to change his appointment July 13, 2020 to a video visit. I clarified if he was familiar with using My Chart video and if he was agreeable. He does not require assistance with access. I conferred with Alana, clinic registration staff to change this appointment. Collaboration with Treatment Team/Community Providers/Family: Nadene Rubins, APRN, Percival Spanish, clinic registration staff Outcome Resolved Handoff Required? No Next Steps/Plan (including hand-off): [name] will keep video visit this afternoon with APRN. Social work intervention complete. Signature: Alphonzo Dublin LMSW Contact Information: Mobile [phone]

## 2020-07-14 ENCOUNTER — Encounter
Admit: 2020-07-14 | Payer: BLUE CROSS/BLUE SHIELD | Attending: Orthopaedic Trauma | Primary: Student in an Organized Health Care Education/Training Program

## 2020-07-14 ENCOUNTER — Telehealth
Admit: 2020-07-14 | Payer: PRIVATE HEALTH INSURANCE | Attending: Family | Primary: Student in an Organized Health Care Education/Training Program

## 2020-07-14 ENCOUNTER — Inpatient Hospital Stay
Admit: 2020-07-14 | Discharge: 2020-07-14 | Payer: BLUE CROSS/BLUE SHIELD | Primary: Student in an Organized Health Care Education/Training Program

## 2020-07-14 ENCOUNTER — Encounter
Admit: 2020-07-14 | Payer: PRIVATE HEALTH INSURANCE | Attending: Orthopaedic Trauma | Primary: Student in an Organized Health Care Education/Training Program

## 2020-07-14 DIAGNOSIS — D5700 Hb-SS disease with crisis, unspecified: Secondary | ICD-10-CM

## 2020-07-14 DIAGNOSIS — D7389 Other diseases of spleen: Secondary | ICD-10-CM

## 2020-07-14 DIAGNOSIS — IMO0002 Dactylitis: Secondary | ICD-10-CM

## 2020-07-14 DIAGNOSIS — G8929 Other chronic pain: Secondary | ICD-10-CM

## 2020-07-14 DIAGNOSIS — K219 Gastro-esophageal reflux disease without esophagitis: Secondary | ICD-10-CM

## 2020-07-14 DIAGNOSIS — S72362A Displaced segmental fracture of shaft of left femur, initial encounter for closed fracture: Secondary | ICD-10-CM

## 2020-07-14 DIAGNOSIS — D6189 Other specified aplastic anemias and other bone marrow failure syndromes: Secondary | ICD-10-CM

## 2020-07-14 DIAGNOSIS — Z7964 On hydroxyurea therapy: Secondary | ICD-10-CM

## 2020-07-14 DIAGNOSIS — Z23 Encounter for immunization: Secondary | ICD-10-CM

## 2020-07-14 DIAGNOSIS — M87052 Idiopathic aseptic necrosis of left femur: Secondary | ICD-10-CM

## 2020-07-14 DIAGNOSIS — D571 Sickle-cell disease without crisis: Secondary | ICD-10-CM

## 2020-07-14 DIAGNOSIS — H902 Conductive hearing loss, unspecified: Secondary | ICD-10-CM

## 2020-07-14 NOTE — Telephone Encounter
Scheduled 2 month follow up in person with labs and visit with Phillip Bell on Monday 09/14/2020.

## 2020-07-14 NOTE — Progress Notes
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  He has a sickle cell anemic patient.  He does have occasionally sickle cell crises as well.He recently was seen with repeat radiographs of his left femur which demonstrated a change in his orientation of his implants, failure of his interlocking bolts.  The patient states that he has never felt this good in terms of stability in his leg although he still has some groin pain and some mild thigh pain particularly with stand pivot type activities.Physical examination:This is a pleasant male appropriate with the examination conversant and not toxicExamination of his left hip demonstrates no overt signs of skin at risk, there is no obvious drainage from his surgical wounds.  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  We would need to wait an additional few months to see if his pain resolves and there radiographic changes suggestive of healing.Particularly based upon the patient's significant improvement in symptoms now that his fracture dynamics have changed and he likely has more compression across his fracture we will continue to observe the patient with non operative management.We will see the patient back in approximately 6 weeks with repeat radiographs of left femur.

## 2020-07-14 NOTE — Progress Notes
Review of Systems Constitutional: Negative.  HENT: Negative.  Eyes: Negative.  Respiratory: Negative.  Cardiovascular: Negative.  Gastrointestinal: Negative.  Genitourinary: Negative.  Musculoskeletal:      Follow up left femur. Pt denies pain Skin: Negative.  Neurological: Negative.  Endo/Heme/Allergies:      Sickle cell

## 2020-07-21 NOTE — Progress Notes
Adult Sickle Cell Program[facility] [phone]/20/2021[name] DOB [birth year] CCScheduled return for sickle cell disease.ProbPatient Active Problem List Diagnosis ? Eustachian tube disorder, bilateral ? Tonsillar and adenoid hypertrophy ? Sickle cell disease, type SS (HC Code) (HC CODE) ? 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 Hx acute chest syndrome (HC Code) ? Hx cholecystectomy ? Closed displaced segmental fracture of shaft of left femur, initial encounter (HC Code) (HC CODE) ? Sickle cell disease without crisis (HC Code) (HC CODE) ? Decreased range of motion (ROM) of left knee ? Decreased strength of lower extremity ? Altered gait ? Sickle cell disease with crisis (HC Code) (HC CODE) ? Pneumonia ? Acute chest syndrome [facility] CODE) MDIn MD in [place] where he is in school, q63m, sickle cell clinic. Advised to have MD in [facility]. Brief HistorySickle cell disease (HbSS)Multiple hospitalizations for acute sickle cell painAvascular necrosis of left femoral headHx acute chest syndromeHx blood transfusionOther PMHxSurgical Hx1/15	Cholecystectomy3/16	PE tubes2017	Wisdom teeth removedFMHxMedCurrent Outpatient Medications Medication Sig ? hydroxyurea (HYDREA) 500 mg capsule 3 tab daily for blood. ? ibuprofen (ADVIL,MOTRIN) 600 mg tablet Up to 4 tablets daily as needed for pain. ? Miscellaneous Medical Supply Heel lift Left shoeUse as directed. ? oxyCODONE (ROXICODONE) 15 mg Immediate Release tablet 1 - 3 tablets every 3 h as needed for pain. ? oxyCODONE myristate (XTAMPZA ER) 13.5 mg 12 hr extended release sprinkle capsule Take 1 capsule (13.5 mg total) by mouth every 12 (twelve) hours. Long-acting medicine to control pain. 340b. D57.1. For prior authorization Fax [phone]. ? cefuroxime (CEFTIN) 500 mg tablet Take 1 tablet (500 mg total) by mouth every 12 (twelve) hours. (Patient not taking: Reported on 06/22/2020) ? doxycycline hyclate (VIBRA-TABS) 100 mg tablet Take 1 tablet (100 mg total) by mouth every 12 (twelve) hours. (Patient not taking: Reported on 06/22/2020) ? naloxone (NARCAN) 4 mg/actuation spray Use 1 spray in 1 nostril for suspected opioid overdose. May repeat in 2 minutes in other nostril with new device if minimal or no response. ? senna (SENOKOT) 8.6 mg tablet Take 2 tablets (17.2 mg total) by mouth every 3 (three) hours as needed for constipation. (Patient not taking: Reported on 02/11/2020) No current facility-administered medications for this visit. AllergiesNo Known Allergies Interval Hx/ROSRecent discharge. Treated for community acquired pneumonia. Reports persistent pain to right chest. The pain is described as squeezing.Oral pain medications not effectiveChest x-ray obtained today.Also had xray of left femur. Per xray, possible screw loose- Message sent to Orthopedic via Dr. Su Hilt. PEBP 120/71  - Pulse 74  - Temp 98.9 ?F (37.2 ?C) (Oral)  - Resp 19  - Ht 6' 1 (1.854 m)  - Wt 66.7 kg  - SpO2 99%  - BMI 19.40 kg/m?  General: alert, oriented, conversant, nadEyes: anicteric sclera, corrective lensesCV: rrrResp: cta b/l, normal respiratory effortAbdomen:  Soft,non tender, bowel sounds presentExtremities: No erythema, no swelling to left kneePsych: normal affectInterval Lab/ImagingResults for Phillip Bell, Phillip Bell (MRN [id number]) as of 06/09/2020 13:59 Ref. Range 05/18/2020 13:47 Sodium Latest Ref Range: 136 - 144 mmol/L 139 Potassium Latest Ref Range: 3.3 - 5.3 mmol/L 3.9 Chloride Latest Ref Range: 98 - 107 mmol/L 105 CO2 Latest Ref Range: 20 - 30 mmol/L 24 Anion Gap Latest Ref Range: 7 - 17  10 BUN Latest Ref Range: 6 - 20 mg/dL 9 Creatinine Latest Ref Range: 0.40 - 1.30 mg/dL 3.08 BUN/Creatinine Ratio Latest Ref Range: 8.0 - 23.0  11.7 eGFR (NON African-American) Latest Ref Range: >60 mL/min/1.70m2 >60 eGFR (Afr Amer) Latest Ref Range: >60  mL/min/1.9m2 >60 Glucose Latest Ref Range: 70 - 100 mg/dL 132 (H) Calcium Latest Ref Range: 8.8 - 10.2 mg/dL 9.6 Total Bilirubin Latest Ref Range: <=1.2 mg/dL 2.3 (H) Bilirubin, Direct Latest Ref Range: <=0.3 mg/dL 0.3 Alkaline Phosphatase Latest Ref Range: 9 - 122 U/L 71 Alanine Aminotransferase (ALT) Latest Ref Range: 9 - 59 U/L 14 Aspartate Aminotransferase (AST) Latest Ref Range: 10 - 35 U/L 30 AST/ALT Ratio Latest Ref Range: See Comment  2.1 Total Protein Latest Ref Range: 6.6 - 8.7 g/dL 7.5 Albumin Latest Ref Range: 3.6 - 4.9 g/dL 4.9 Globulin Latest Ref Range: 2.3 - 3.5 g/dL 2.6 A/G Ratio Latest Ref Range: 1.0 - 2.2  1.9 WBC Latest Ref Range: 4.0 - 11.0 x1000/?L 9.5 RBC Latest Ref Range: 4.00 - 6.00 M/?L 2.21 (L) Hemoglobin Latest Ref Range: 13.2 - 17.1 g/dL 8.3 (L) Hematocrit Latest Ref Range: 38.50 - 50.00 % 23.60 (L) MCV Latest Ref Range: 80.0 - 100.0 fL 106.8 (H) MCH Latest Ref Range: 27.0 - 33.0 pg 37.6 (H) MCHC Latest Ref Range: 31.0 - 36.0 g/dL 44.0 RDW-CV Latest Ref Range: 11.0 - 15.0 % 15.6 (H) Platelets Latest Ref Range: 150 - 420 x1000/?L 464 (H) MPV Latest Ref Range: 8.0 - 12.0 fL 9.4 Neutrophils Latest Ref Range: 39.0 - 72.0 % 43.7 Lymphocytes Latest Ref Range: 17.0 - 50.0 % 37.9 Monocytes Latest Ref Range: 4.0 - 12.0 % 13.6 (H) Eosinophils Latest Ref Range: 0.0 - 5.0 % 3.5 Basophils Latest Ref Range: 0.0 - 1.4 % 0.8 Immature Granulocytes Latest Ref Range: 0.0 - 1.0 % 0.5 nRBC Latest Ref Range: 0.0 - 1.0 % 3.7 (H) ANC (Abs Neutrophil Count) Latest Ref Range: 2.00 - 7.60 x 1000/?L 4.16 Absolute Lymphocyte Count Latest Ref Range: 0.60 - 3.70 x 1000/?L 3.61 Monocytes (Absolute) Latest Ref Range: 0.00 - 1.00 x 1000/?L 1.30 (H) Eosinophil Absolute Count Latest Ref Range: 0.00 - 1.00 x 1000/?L 0.33 Basophils Absolute Latest Ref Range: 0.00 - 1.00 x 1000/?L 0.08 Immature Granulocytes (Abs) Latest Ref Range: 0.00 - 0.30 x 1000/?L 0.05 nRBC Absolute Latest Ref Range: 0.00 - 1.00 x 1000/?L 0.40 Retic [place] Pct Latest Ref Range: 0.6 - 2.7 % 11.0 (H) Absolute Reticulocyte Count Latest Ref Range: 0.023 - 0.140 10?6 cells/uL 0.240 (H) Immature Retic Fract Latest Ref Range: 3.0 - 15.9 % 36.1 (H) Reticulocyte Hgb Equivalent Latest Ref Range: 28.2 - 35.7 pg 36.5 (H) Imp/PlanCovid-19?	Discussed 4/20.?	01/16/2020: Vaccinated x 2. Recent PNA?	06/22/2020:  Recently hospitalized and treated for pneumonia. Continues with persistent chest pain. Plan obtain CTA to rule out pulmonary emboli.Sickle cell disease (Hbxx)?	02/18/2019: On hydroxyurea 500 mg 3 tab daily. Reluctant to increase dose. Sent names of 3 new drugs, glutamine, voxelotor, crizanlizumab. ?	03/20/2019: Read about 2 of 3 meds. No interest at this time. ?	01/16/2020?	Hydroxyurea: Taking 3 tab daily.?	Glutamine: not interested?	Voxelotor: no indication?	Crizanlizumab: not interested?	05/18/2020:  Continues hydroxyurea 3 tabs daily. Transfusion Hx?	09/11/2019: Transfusion [place] [facility] [place] [facility] in [place]. Social situation?	Lives: parents in [place]. 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 Interests: hanging out, applying for jobs?	01/16/2020: Continues to live with parents. 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 Also considering moving to [employer] (college there).  ?	03/18/2020:  Starting a new job in 1 week. ?	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 Discharged on apixaban, apparently stopped it  early. 03/18/2020:  In follow up with Ortho. 05/18/2020: Would like to return back to having physical therapy, he will communicate with Orthopedic.06/22/2020: Xray of left femur notable for possible screw loose.  Dr. Su Hilt sent message to Orthopedics.Intermittent pain?	Typical sickle cell pain?	Oxycodone 10 mg tab and oxycodone ERT 10 mg #60 uses irregularly but rationally for persistent pain crisis. Last dispensed 7/19 according to PMP. ?	02/18/2019: doing well on oxycodone IR 10 mg #60 and oxycodone ERT 10 mg #60 every month. ?	10/09/2019: Continue as above. No request for Rx's today.?	03/18/2020:  Continue as above. Will request prior authorization from PARS department.?	11/15/2021Marlowe Kays and oxycodone continueMedical marijuana?	Discussed 07/17/17. Certified and accessed thereafter. ?	10/09/2019: Re-certified 3/21.?	01/16/2020: Trying to be clean.Health MaintenanceFYI: 3/10/2021Urine tox screen: OK 07/17/17.PMP: OK 3/10/2021Naloxone: in possession 3/10/2021Urine prot/creat ratio: 0.09 1/19Iron status: 643 7/17IV access: OKCigarettes: noAlcohol: occassionalEye: Sees community MD ophth Dr Luanne Bras: reports q58mVaccine/AntibodiesInfluenza: 10/19: PPV23: 10/15, 3/14PCV13: 7/12Men-4: 7/14, 7/11Men-B: 7/19Tdap: 4/21Hep A: Ab pos 1/19Hep B: sAb neg 1/19.  Dose #1 09/07/17; Dose #2 04/16/18 #3 4/07/2021Hep C: Ab neg 1/19Varicella: Ab pos 8/18. HPV: 7/19, 7/14, 8/13HIV: neg 1/19GuidelineInfluenza	Annually				Varicella	Evidence of immunity:PPV23		2 doses 5 y apart; peds doses count			Hx vaccine x 2		3rd dose age 22 					[place] born before 56		8 wks from PCV13					Hx varicella			1 mo from Men-4/Men-B 				Hx varicella zosterPCV13		1 lifetime dose						Ab pos		1 y from PPV23								1 mo from Men-4/Men-B 		Zoster		60+Men-4		2 or 3 doses depending upon vaccine		2 mo apart		1 mo from PPV23/PCV13		HPV		Women thru age 26Men-B		2 doses 2 mo apart		1 mo from PPV23/PCV13				0, 1, 4 moTdap		Every 10 years				HIV		Ab screenHep A		2 doses 6 mo apartHep B		0, 1, 4 moHep C		Ab screen onceDispositionRTC in 4 Doretha Imus, APRN

## 2020-07-24 ENCOUNTER — Telehealth
Admit: 2020-07-24 | Payer: PRIVATE HEALTH INSURANCE | Attending: Student in an Organized Health Care Education/Training Program | Primary: Student in an Organized Health Care Education/Training Program

## 2020-07-24 NOTE — Telephone Encounter
[facility] Coronavirus RN Phone ScreenI have reviewed the reason for daily monitoring calls with the patient which include monitoring for worsening symptoms and providing education on self-isolation and symptoms to report. The patient does not agree to daily home monitoring calls.Date of COVID-19 test:  1/1/22Patient appears to have resolved symptoms.Counseled patient to Symptoms resolved discontinue call monitoringPatient verbalized understanding. Discussed plan with patient's provider: n/a

## 2020-07-30 ENCOUNTER — Inpatient Hospital Stay
Admit: 2020-07-30 | Discharge: 2020-07-30 | Payer: BLUE CROSS/BLUE SHIELD | Primary: Student in an Organized Health Care Education/Training Program

## 2020-07-30 DIAGNOSIS — M25662 Stiffness of left knee, not elsewhere classified: Secondary | ICD-10-CM

## 2020-07-30 DIAGNOSIS — R269 Unspecified abnormalities of gait and mobility: Secondary | ICD-10-CM

## 2020-08-03 DIAGNOSIS — R29898 Other symptoms and signs involving the musculoskeletal system: Secondary | ICD-10-CM

## 2020-08-03 DIAGNOSIS — S72362A Displaced segmental fracture of shaft of left femur, initial encounter for closed fracture: Secondary | ICD-10-CM

## 2020-08-03 NOTE — Progress Notes
[facility] [phone]/10/2022[name] Wigen DOB [birth year] CCScheduled return for sickle cell disease.ProbPatient Active Problem List Diagnosis ? Eustachian tube disorder, bilateral ? Tonsillar and adenoid hypertrophy ? Sickle cell disease, type SS (HC Code) (HC CODE) ? 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 Hx acute chest syndrome (HC Code) ? Hx cholecystectomy ? Closed displaced segmental fracture of shaft of left femur, initial encounter (HC Code) (HC CODE) ? Sickle cell disease without crisis (HC Code) (HC CODE) ? Decreased range of motion (ROM) of left knee ? Decreased strength of lower extremity ? Altered gait ? Sickle cell disease with crisis (HC Code) (HC CODE) ? Pneumonia ? Acute chest syndrome [facility] CODE) MDIn MD in [place] where he is in school, q63m, sickle cell clinic. Advised to have MD in [facility]. Brief HistorySickle cell disease (HbSS)Multiple hospitalizations for acute sickle cell painAvascular necrosis of left femoral headHx acute chest syndromeHx blood transfusionOther PMHxSurgical Hx1/15	Cholecystectomy3/16	PE tubes2017	Wisdom teeth removedFMHxMedCurrent Outpatient Medications Medication Sig ? cefuroxime (CEFTIN) 500 mg tablet Take 1 tablet (500 mg total) by mouth every 12 (twelve) hours. (Patient not taking: Reported on 06/22/2020) ? doxycycline hyclate (VIBRA-TABS) 100 mg tablet Take 1 tablet (100 mg total) by mouth every 12 (twelve) hours. (Patient not taking: Reported on 06/22/2020) ? hydroxyurea (HYDREA) 500 mg capsule 3 tab daily for blood. ? ibuprofen (ADVIL,MOTRIN) 600 mg tablet Up to 4 tablets daily as needed for pain. ? Miscellaneous Medical Supply Heel lift Left shoeUse as directed. ? naloxone (NARCAN) 4 mg/actuation spray Use 1 spray in 1 nostril for suspected opioid overdose. May repeat in 2 minutes in other nostril with new device if minimal or no response. ? oxyCODONE (ROXICODONE) 15 mg Immediate Release tablet 1 - 3 tablets every 3 h as needed for pain. ? oxyCODONE myristate (XTAMPZA ER) 13.5 mg 12 hr extended release sprinkle capsule Take 1 capsule (13.5 mg total) by mouth every 12 (twelve) hours. Long-acting medicine to control pain. 340b. D57.1. For prior authorization Fax [phone]. ? senna (SENOKOT) 8.6 mg tablet Take 2 tablets (17.2 mg total) by mouth every 3 (three) hours as needed for constipation. (Patient not taking: Reported on 02/11/2020) No current facility-administered medications for this visit. AllergiesNo Known Allergies Interval Hx/ROSTelemedicine visit with Imraan.  He was positive for covid  On return from [place] after Christmas. Reports his symptoms were mild.  Scheduled to see ortho tomorrow regarding his left hip. No fever,chills, SOB, chest pain, nausea, vomiting or constipation. PEThere were no vitals taken for this visit. Interval Lab/ImagingResults for XAVIAR, LUNN (MRN [id number]) as of 06/09/2020 13:59 Ref. Range 05/18/2020 13:47 Sodium Latest Ref Range: 136 - 144 mmol/L 139 Potassium Latest Ref Range: 3.3 - 5.3 mmol/L 3.9 Chloride Latest Ref Range: 98 - 107 mmol/L 105 CO2 Latest Ref Range: 20 - 30 mmol/L 24 Anion Gap Latest Ref Range: 7 - 17  10 BUN Latest Ref Range: 6 - 20 mg/dL 9 Creatinine Latest Ref Range: 0.40 - 1.30 mg/dL 8.65 BUN/Creatinine Ratio Latest Ref Range: 8.0 - 23.0  11.7 eGFR (NON African-American) Latest Ref Range: >60 mL/min/1.69m2 >60 eGFR (Afr Amer) Latest Ref Range: >60 mL/min/1.47m2 >60 Glucose Latest Ref Range: 70 - 100 mg/dL 784 (H) Calcium Latest Ref Range: 8.8 - 10.2 mg/dL 9.6 Total Bilirubin Latest Ref Range: <=1.2 mg/dL 2.3 (H) Bilirubin, Direct Latest Ref Range: <=0.3 mg/dL 0.3 Alkaline Phosphatase Latest Ref Range: 9 - 122 U/L 71 Alanine Aminotransferase (ALT) Latest Ref Range: 9 - 59 U/L 14 Aspartate Aminotransferase (AST) Latest  Ref Range: 10 - 35 U/L 30 AST/ALT Ratio Latest Ref Range: See Comment  2.1 Total Protein Latest Ref Range: 6.6 - 8.7 g/dL 7.5 Albumin Latest Ref Range: 3.6 - 4.9 g/dL 4.9 Globulin Latest Ref Range: 2.3 - 3.5 g/dL 2.6 A/G Ratio Latest Ref Range: 1.0 - 2.2  1.9 WBC Latest Ref Range: 4.0 - 11.0 x1000/?L 9.5 RBC Latest Ref Range: 4.00 - 6.00 M/?L 2.21 (L) Hemoglobin Latest Ref Range: 13.2 - 17.1 g/dL 8.3 (L) Hematocrit Latest Ref Range: 38.50 - 50.00 % 23.60 (L) MCV Latest Ref Range: 80.0 - 100.0 fL 106.8 (H) MCH Latest Ref Range: 27.0 - 33.0 pg 37.6 (H) MCHC Latest Ref Range: 31.0 - 36.0 g/dL 47.4 RDW-CV Latest Ref Range: 11.0 - 15.0 % 15.6 (H) Platelets Latest Ref Range: 150 - 420 x1000/?L 464 (H) MPV Latest Ref Range: 8.0 - 12.0 fL 9.4 Neutrophils Latest Ref Range: 39.0 - 72.0 % 43.7 Lymphocytes Latest Ref Range: 17.0 - 50.0 % 37.9 Monocytes Latest Ref Range: 4.0 - 12.0 % 13.6 (H) Eosinophils Latest Ref Range: 0.0 - 5.0 % 3.5 Basophils Latest Ref Range: 0.0 - 1.4 % 0.8 Immature Granulocytes Latest Ref Range: 0.0 - 1.0 % 0.5 nRBC Latest Ref Range: 0.0 - 1.0 % 3.7 (H) ANC (Abs Neutrophil Count) Latest Ref Range: 2.00 - 7.60 x 1000/?L 4.16 Absolute Lymphocyte Count Latest Ref Range: 0.60 - 3.70 x 1000/?L 3.61 Monocytes (Absolute) Latest Ref Range: 0.00 - 1.00 x 1000/?L 1.30 (H) Eosinophil Absolute Count Latest Ref Range: 0.00 - 1.00 x 1000/?L 0.33 Basophils Absolute Latest Ref Range: 0.00 - 1.00 x 1000/?L 0.08 Immature Granulocytes (Abs) Latest Ref Range: 0.00 - 0.30 x 1000/?L 0.05 nRBC Absolute Latest Ref Range: 0.00 - 1.00 x 1000/?L 0.40 Retic [place] Pct Latest Ref Range: 0.6 - 2.7 % 11.0 (H) Absolute Reticulocyte Count Latest Ref Range: 0.023 - 0.140 10?6 cells/uL 0.240 (H) Immature Retic Fract Latest Ref Range: 3.0 - 15.9 % 36.1 (H) Reticulocyte Hgb Equivalent Latest Ref Range: 28.2 - 35.7 pg 36.5 (H) Imp/PlanCovid-19?	Discussed 4/20.?	01/16/2020: Vaccinated x 2. ?	07/13/2020:  Eligible for 3rd dose.Sickle cell disease (Hbxx)?	02/18/2019: On hydroxyurea 500 mg 3 tab daily. Reluctant to increase dose. Sent names of 3 new drugs, glutamine, voxelotor, crizanlizumab. ?	03/20/2019: Read about 2 of 3 meds. No interest at this time. ?	01/16/2020?	Hydroxyurea: Taking 3 tab daily.?	Glutamine: not interested?	Voxelotor: no indication?	Crizanlizumab: not interested?	05/18/2020:  Continues hydroxyurea 3 tabs daily. Transfusion Hx?	09/11/2019: [facility] [place] [facility] [place] [facility] in [place]. Social situation?	Lives: parents in [place]. 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 Interests: hanging out, applying for jobs?	01/16/2020: Continues to live with parents. 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 Also considering moving to [employer] (college there).  ?	03/18/2020:  Starting a new job in 1 week. ?	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 Discharged on apixaban, apparently stopped it early. 03/18/2020:  In follow up with Ortho. 05/18/2020: Would like to return back to having physical therapy, he will communicate with Orthopedic.07/13/2020:  Has follow up with Ortho tomorrow. Intermittent pain?	Typical sickle cell pain?	Oxycodone 10 mg tab and oxycodone ERT 10 mg #60 uses irregularly but rationally for persistent pain crisis. Last dispensed 7/19 according to PMP. ?	02/18/2019: doing well on oxycodone IR 10 mg #60 and oxycodone ERT 10 mg #60 every month. ?	10/09/2019: Continue as above.  No request for Rx's today.?	03/18/2020:  Continue as above. Will request prior authorization from PARS department.?	11/15/2021Marlowe Kays and oxycodone continueMedical marijuana?	Discussed 07/17/17. Certified and accessed thereafter. ?	10/09/2019: Re-certified 3/21.?	01/16/2020: Trying to be clean.Health MaintenanceFYI: 3/10/2021Urine tox screen: OK 07/17/17.PMP: OK 3/10/2021Naloxone: in possession 3/10/2021Urine prot/creat ratio: 0.09 1/19Iron status: 643 7/17IV access: OKCigarettes: noAlcohol: occassionalEye: Sees community MD ophth Dr Luanne Bras: reports q60mVaccine/AntibodiesInfluenza: 10/19: Declined today would like to receive on 12/14/2021PPV23: 10/15, 3/14PCV13: 7/12Men-4: 7/14, 7/11Men-B: 7/19Tdap: 4/21Hep A: Ab pos 1/19Hep B: sAb neg 1/19.  Dose #1 09/07/17; Dose #2 04/16/18 #3 4/07/2021Hep C: Ab neg 1/19Varicella: Ab pos 8/18. HPV: 7/19, 7/14, 8/13HIV: neg 1/19GuidelineInfluenza	Annually				Varicella	Evidence of immunity:PPV23		2 doses 5 y apart; peds doses count			Hx vaccine x 2		3rd dose age 4 					[place] born before [birth year]		8 wks from PCV13					Hx varicella			1 mo from Men-4/Men-B 				Hx varicella zosterPCV13		1 lifetime dose						Ab pos		1 y from PPV23								1 mo from Men-4/Men-B 		Zoster		60+Men-4		2 or 3 doses depending upon vaccine		2 mo apart		1 mo from PPV23/PCV13		HPV		Women thru age 26Men-B		2 doses 2 mo apart		1 mo from PPV23/PCV13				0, 1, 4 moTdap		Every 10 years				HIV		Ab screenHep A		2 doses 6 mo apartHep B		0, 1, 4 moHep C		Ab screen onceDispositionVIDEO TELEHEALTH VISIT: This clinician is part of the telehealth program and is conducting this visit in a currently approved location. For this visit the clinician and patient were present via interactive audio & video telecommunications system that permits real-time communications, via the telemedicine platform.Patient's use of the telehealth platform followed consent and acknowledges agreement to permit telehealth for this visit. Patient is in [place]. The clinician is appropriately licensed in the above state to provide care for this visit. Other individuals present during the telehealth encounter and their role/relation: NoneIf billing based on time, please complete (Not required if billing based on MDM):                           Total time spent in medical video consultation: 18 minutes; Total time spent by the provider on the day of service, which includes time spent on chart review, medical video consultation, education, coordination of care/services and counseling Because this visit was completed over video, a hands-on physical exam was not performed.  Patient/parent or guardian understands and knows to call back if condition changes.RTC in 2 monthsJoanna Bradley, APRN

## 2020-08-03 NOTE — Other
REHABILITATION SERVICES AT [facility] [address] Number: [phone] Number: [phone] Therapy Plan of Care1/27/2022Patient Name:  Phillip RiceMR#:  [id number] Date of Birth:  03-04-1997Referring Provider:  Sheppard Plumber, PAReferring Provider NPI:  [id number] Therapist:  [name], PTProblem List         ICD-10-CM   PT - S/P ORIF (L) femur fx - July '21  Closed displaced segmental fracture of shaft of left femur, initial encounter (HC Code) (HC CODE) S72.362A  Decreased range of motion (ROM) of left knee M25.662  Decreased strength of lower extremity R29.898  Altered gait R26.9   INFUSION TREATMENT  Sickle cell disease, type SS (HC Code) (HC CODE) D57.1  Your signature indicates that you have reviewed and agree with the established Plan of Care dated 07/30/2020.  This document serves to support medical necessity for continued outpatient Physical Therapy services until 09/24/20 for in-person or telehealth services..Please co-sign this document electronically or return via the fax number listed on the document.Additional Physician Comments:___________________________________     __________________________________Physician Signature                                           Date___________________________________Physician Printed NamePhysical Therapy Orthopedic Progress Note1/27/2022Patient Name:  Phillip RiceMedical Record Number:  [id number] Date of Birth:  [birth year]Therapist:  [name], PTReferring Provider:  Sheppard Plumber, PAICD-10 Diagnosis(es):Problem List         ICD-10-CM   PT - S/P ORIF (L) femur fx - July '21  Closed displaced segmental fracture of shaft of left femur, initial encounter (HC Code) (HC CODE) S72.362A  Decreased range of motion (ROM) of left knee M25.662  Decreased strength of lower extremity R29.898  Altered gait R26.9   INFUSION TREATMENT  Sickle cell disease, type SS (HC Code) [facility] CODE) D57.1  General InformationTherapy Episode of Care  Date of Visit:  07/30/2020   Treatment Number:  1   Date the Treatment Plan was Initiated/Reviewed:  07/30/2020  Start of Care Date:  07/30/2020   Onset of Illness/Injury Date:  12/20/2019   Date of Surgery:  12/21/2019   Progress Report Due Date:  08/28/2020   MD Order Renewal Date:  08/27/2020 Precautions/Limitations   Precautions/Limitations:  No known precautions/limitations   Precautions/Limitations Comments:  Sickle cell disease with (L) hip and chronic pain pre-injuryInterpreter Ecologist Utilized?  NoCognition / Learning Assessment   Primary Learner Relationship:  Patient        Barriers to learning:  No barriers        Preferred language:  English        Preferred learning style:  Listening, Demonstration, Reading and Pictures/VideoI reviewed the Patient Care Agreement and Attendance Form with the Patient/Family.  The Patient/Family verbalized understanding.Medication Review:Current Outpatient Medications Medication Sig ? cefuroxime Take 1 tablet (500 mg total) by mouth every 12 (twelve) hours. (Patient not taking: Reported on 06/22/2020) ? doxycycline hyclate Take 1 tablet (100 mg total) by mouth every 12 (twelve) hours. (Patient not taking: Reported on 06/22/2020) ? hydroxyurea 3 tab daily for blood. ? ibuprofen Up to 4 tablets daily as needed for pain. ? Miscellaneous Medical Supply Heel lift Left shoeUse as directed. ? naloxone Use 1 spray in 1 nostril for suspected opioid overdose. May repeat in 2 minutes in other nostril with new device if minimal or no response. ? oxyCODONE 1 - 3 tablets every 3 h as needed  for pain. ? oxyCODONE myristate Take 1 capsule (13.5 mg total) by mouth every 12 (twelve) hours. Long-acting medicine to control pain. 340b. D57.1. For prior authorization Fax [phone]. ? senna Take 2 tablets (17.2 mg total) by mouth every 3 (three) hours as needed for constipation. (Patient not taking: Reported on 02/11/2020) SubjectiveSurgery in July -  Stopped using the cane a few weeks ago - really feeling it in the hip.    Pain level about 5/10 - all in the upper part of my leg in the hip region.  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  September to 1 month ago able to work.  Got too painful and cold to continue and no longer working in that position.   Looking for something differentPain Rating:  5 / 10 Now: 0 / 10 Best; 6-7 / 10   Comments: (L) Hip painPertinent History of Current Problem: Past Medical HistoryPast Medical History: Diagnosis Date ? Aplastic crisis (HC Code) (HC CODE) 6/6/ 2005 transfusion ? Avascular necrosis of femur head, left (HC Code) (HC CODE)  ? Chronic pain   sickle cell ? Conductive hearing loss 09/22/14 P.E tubes placed ? Dactylitis one episode  April, 1998 ? GERD (gastroesophageal reflux disease)  ? 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 On hydroxyurea therapy started October 2013 ? Pneumococcal vaccination given 01/20/2006 and 09/28/2012 ? 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 Vasoocclusive sickle cell crisis (HC Code) (HC CODE) Adm 01/02/2012, ER 04/08/12, 4/1-10/03/12, 7/2-01/05/13, 12/02/13-12/03/13 , March 2016 - 2 admissions Not taking any pain medication -  Take the medication for sickle cell anemia.  Past Surgical HistoryPast Surgical History: Procedure Laterality Date ? CHOLECYSTECTOMY, LAPAROSCOPIC  1/15 ? TYMPANOSTOMY TUBE PLACEMENT  09/22/2014  by Dr. Bernita Raisin AllergiesPatient has no known allergies.Social / Emotional Information: active young manPrior Level of Function: Use elastic band on my bed for exerciseUnrestricted but deals with chronic pain issues    Change in Status from prior level of function?  yesObjectivePosture: Sits with poorly maintained lumbar lordosisROM/MMT: ROM          Left          MMT ROM        Right         MMT Hip   Flexion   124 with pain                 4+    WNL                            5 Extension   NT beyond 0                  4    WNL                            5 Abduction   30 min pain                   4    WNL                            5 Adduction   WNL    WNL  5 Internal Rotation   35 min pain                   4- Painful    WNL                            5 External Rotation   52                                4- Painful    WNL Knee   Flexion   135 deg pain                 4+   135 deg                        5 Extension    0 deg           4+ avg soreness    0 deg                          5 Ankle/Foot   Dorsiflexion   WNL                            5   WNL                            5 Plantarflexion   WNL                            5    WNL                            5 Inversion   WNL                            5   WNL                            5 Eversion   WNL                            5   WNL                            5 Great Toe Extension   WNL                            5   WNL                            5 Palpation/Joint Mobility:Good patellar mobilitySkin/Sensation:Skin intact with surgical scars well healedFunctional Mobility/Balance:Bed Mobility/Transfers:  IndependentAmbulation/Gait Assessment:  Antalgic gait with listing to left - No AD used at clinic today.    Orthopedic Tools/Scales/Outcome MeasuresPatient Specific Functional Scale (PSFS)   Activity #1 Score:  8      Details:   Up & down 13 steps to his room   Activity #2 Score:  6  Details: Walk w/o crutches (changed to cane   Activity #3 Score:  9      Details:   Get in/out of car comfortably   TOTAL SCORE:  7.67   (PSFS Scoring:  0=Extreme difficulty completing task     10=Performs task easily)   Comments:  Previous 6Lower Extremity Functional Scale      Any of your usual work/household/school activities:  2      Your usual hobbies/recreation/sporting activities:  2      Getting into or out of the bath:  3      Walking between rooms:  3      Putting on your socks:   4      Squatting:  2      Lifting an object from the floor:  4      Performing light activities around your home:  3      Performing heavy activities around your home:  1      Getting into or out of your car:  4      Walking 2 blocks:  2      Walking a mile:  1      Go up or down 10 stairs:  3      Standing for 1 hour:  1      Sitting for 1 hour:  2      Running on even ground:  1      Running on uneven ground:  1      Making sharp turns while running fast:  1      Hopping:  1      Rolling over in bed:  3   Total Score (out of a possible 80):  44   76-78/80 is normal for population   Minimal detectable change = 9 points   Minimal clinically important difference = 9 points   Potential error +/- 5.3 points   Comments:  Prev 26Treatment Provided This VisitCPT Code Interventions Timed Minutes Untimed Minutes Total Minutes Therapeutic Exercise (97110) PN MeasurementsSit backsSLS with straight postureITB stretch Hamstring stretchGastroc stretch 40  40 Gait Training ([address])  -  - N/A     N/A       Total Treatment Time: 40 Problem ListDeviated gaitDecreased ROMDecreased strengthMod pain in hipFunctional deficits including disturbed sleep, difficulty with car and bed transfers, ambulation in community, etc per [facility] Starlin returns to therapy after 4 month hiatus with reports of pain to right hip during ADL's. He reports he Is no longer able to perform his job due to demands and increased pain.  His ROM is slightly improved to left hip and LEFS score is significantly improved.  He will benefit from continued PT to improve his gait, increase strength and reduce pain to optimize functional level.  Patient / Family / Caregiver EducationReviewed the assessmentDiscussed plan of care and rationalePatient/Family/Caregiver demonstrate agreement with the plan - Access Code: [email or web address]: [email or web address]: 01/27/2022Prepared by: Hakan Nudelman MARTUCCIExercisesSupine Quad Set - 1 x daily - 7 x weekly - 3 sets - 10 repsStraight Leg Raise with External Rotation - 1 x daily - 7 x weekly - 3 sets - 10 repsStanding ITB Stretch - 2 x daily - 7 x weekly - 1 sets - 3 reps - 20 holdStanding Hamstring Stretch on Chair - 2 x daily - 7 x weekly - 3 sets - 1 reps - 20 holdStanding Gastroc Stretch on Step - 2 x daily - 7 x weekly - 3 sets - 1 reps -  20 holdWall Squat - 1 x daily - 7 x weekly - 3 sets - 10 repsSidelying Hip Adduction - 1 x daily - 7 x weekly - 3 sets - 10 repsProne Hip Extension - 1 x daily - 7 x weekly - 3 sets - 10 repsStanding Single Leg Stance with Counter Support - 1 x daily - 7 x weekly - 3 sets - 10 repsSingle Leg Balance with Knee Flexion - 1 x daily - 7 x weekly - 3 sets - 10 repsRehab PotentialGoodPlanTherapeutic Exercise (97110)Manual Therapy (97140)Therapeutic Activity (97530)Self-Care/Home Management (97535)Gait Training (97116)Neuromuscular Re-Education (97112)Heat - Ice Pack Frequency:  2x per week live and/or virtual visitsDuration:  8 weeksPlan for Next VisitProgress strengthening;  Biodex to improve WB on leftRecommendationsAdvised pt to schedule at which ever site he will be able to get to consistentlyGoalsShort Term Goals  (4-6 weeks):  1. Reduce knee/leg pain to 0-4/10 without need for narcotic meds beyond his baseline Partially met2. Able to resume community ambulation without crutches demonstrating good quad control met3. Demonstrates full AROM (L)knee with good NM control of ant/post/med/lat muscle groups Met4. SLR without lag to improve push-off in gait cycle Met5. Able to get in/out of car and bed with ease Partially met Long Term Goals (12 weeks): 1. Able to squat and lift 50 lb with symmetrical movement without pain X50 Not met2. Able to perform 30 step-ups with full TKE w/o valgus deformity Not met3. Demonstrates good flexibility of LE's Met4. Independent with HEP - not met5. Demonstrates good gait pattern without compensations all surfaces Partially metJoseph Versia Mignogna, PT

## 2020-08-06 ENCOUNTER — Inpatient Hospital Stay
Admit: 2020-08-06 | Discharge: 2020-08-06 | Payer: BLUE CROSS/BLUE SHIELD | Primary: Student in an Organized Health Care Education/Training Program

## 2020-08-06 ENCOUNTER — Encounter
Admit: 2020-08-06 | Payer: PRIVATE HEALTH INSURANCE | Attending: Family | Primary: Student in an Organized Health Care Education/Training Program

## 2020-08-06 ENCOUNTER — Telehealth
Admit: 2020-08-06 | Payer: PRIVATE HEALTH INSURANCE | Attending: Hematology & Oncology | Primary: Student in an Organized Health Care Education/Training Program

## 2020-08-06 DIAGNOSIS — S72362A Displaced segmental fracture of shaft of left femur, initial encounter for closed fracture: Secondary | ICD-10-CM

## 2020-08-06 DIAGNOSIS — R269 Unspecified abnormalities of gait and mobility: Secondary | ICD-10-CM

## 2020-08-06 DIAGNOSIS — R29898 Other symptoms and signs involving the musculoskeletal system: Secondary | ICD-10-CM

## 2020-08-06 DIAGNOSIS — D571 Sickle-cell disease without crisis: Secondary | ICD-10-CM

## 2020-08-06 MED ORDER — OXYCODONE IMMEDIATE RELEASE 15 MG TABLET
15 mg | ORAL_TABLET | 1 refills | Status: AC
Start: 2020-08-06 — End: 2020-09-01

## 2020-08-06 MED ORDER — IBUPROFEN 600 MG TABLET
600 mg | ORAL_TABLET | 2 refills | Status: AC
Start: 2020-08-06 — End: 2020-12-07

## 2020-08-06 NOTE — Telephone Encounter
Pt of Dr. Su Hilt and Mardene Celeste Looking for a refill on his ibuprofen (ADVIL,MOTRIN) 600 mg tablet and oxyCODONE (ROXICODONE) 15 mg Immediate Release tabletCVS [place] on [address]

## 2020-08-06 NOTE — Other
REHABILITATION SERVICES AT [facility] Services At [facility]  [id number] Number: [phone] Number: [id number] Therapy Daily NotePatient Name:  Phillip RiceMedical Record Number:  [id number] Date of Birth:  11-Dec-1997Therapist:  [name], PTReferring Provider:  Sheppard Plumber, PAICD-10 Diagnosis(es):Problem List         ICD-10-CM   PT - S/P ORIF (L) femur fx - July '21  Closed displaced segmental fracture of shaft of left femur, initial encounter (HC Code) (HC CODE) S72.362A  Decreased range of motion (ROM) of left knee M25.662  Decreased strength of lower extremity R29.898  Altered gait R26.9   INFUSION TREATMENT  Sickle cell disease, type SS (HC Code) [facility] CODE) D57.1  General InformationTherapy Episode of Care  Date of Visit:  08/06/2020   Treatment Number:  2   Date the Treatment Plan was Initiated/Reviewed:  07/30/2020  Start of Care Date:  07/30/2020   Progress Report Due Date:  08/28/2020   MD Order Renewal Date:  08/28/2020 Medication Review:Current Outpatient Medications Medication Sig ? cefuroxime Take 1 tablet (500 mg total) by mouth every 12 (twelve) hours. (Patient not taking: Reported on 06/22/2020) ? doxycycline hyclate Take 1 tablet (100 mg total) by mouth every 12 (twelve) hours. (Patient not taking: Reported on 06/22/2020) ? hydroxyurea 3 tab daily for blood. ? ibuprofen Up to 4 tablets daily as needed for pain. ? Miscellaneous Medical Supply Heel lift Left shoeUse as directed. ? naloxone Use 1 spray in 1 nostril for suspected opioid overdose. May repeat in 2 minutes in other nostril with new device if minimal or no response. ? oxyCODONE 1 - 3 tablets every 3 h as needed for pain. ? oxyCODONE myristate Take 1 capsule (13.5 mg total) by mouth every 12 (twelve) hours. Long-acting medicine to control pain. 340b. D57.1. For prior authorization Fax [phone]. ? senna Take 2 tablets (17.2 mg total) by mouth every 3 (three) hours as needed for constipation. (Patient not taking: Reported on 02/11/2020) SubjectiveFeeling a little sore on the leftObjectiveTreatment Provided This VisitCPT Code Interventions Timed Minutes Untimed Minutes Total Minutes Therapeutic Exercise (97110) [place] lvl 4 8 minBiodexSit backs with TRXStep ups x10 6Step down with eccentric loading on left x 10 6  15  15  Neuromuscular Re-Education (03474) Biodex - Limits of stability training (R) low skillWeight shift 2 min narrow skill levelCatch game 60s 15  15 N/A     N/A       Total Treatment Time: 30 AssessmentGood tolerance to all activity. VC for eccentric loading on step down.   Difficulty with COG on Biodex - able to adjust to weight shift on left.  PlanFrequency:  1x per weekPlan for Next VisitContinue with Biodex,  Add lunges as toleratedJoseph Weldon Nouri, PT

## 2020-08-07 ENCOUNTER — Encounter
Admit: 2020-08-07 | Payer: PRIVATE HEALTH INSURANCE | Attending: Family | Primary: Student in an Organized Health Care Education/Training Program

## 2020-08-07 DIAGNOSIS — D571 Sickle-cell disease without crisis: Secondary | ICD-10-CM

## 2020-08-13 ENCOUNTER — Inpatient Hospital Stay
Admit: 2020-08-13 | Discharge: 2020-08-13 | Payer: BLUE CROSS/BLUE SHIELD | Primary: Student in an Organized Health Care Education/Training Program

## 2020-08-13 DIAGNOSIS — R29898 Other symptoms and signs involving the musculoskeletal system: Secondary | ICD-10-CM

## 2020-08-13 DIAGNOSIS — S72362A Displaced segmental fracture of shaft of left femur, initial encounter for closed fracture: Secondary | ICD-10-CM

## 2020-08-13 DIAGNOSIS — D571 Sickle-cell disease without crisis: Secondary | ICD-10-CM

## 2020-08-13 NOTE — Other
REHABILITATION SERVICES AT [facility] Services At [facility] [place] [address] Number: [phone] Number: [id number] Therapy Daily NotePatient Name:  Phillip RiceMedical Record Number:  [id number] Date of Birth:  1997/06/04Therapist:  Clovia Cuff, PTAReferring Provider:  Sheppard Plumber, PAICD-10 Diagnosis(es):Problem List         ICD-10-CM   PT - S/P ORIF (L) femur fx - July '21  Closed displaced segmental fracture of shaft of left femur, initial encounter (HC Code) (HC CODE) S72.362A  Decreased range of motion (ROM) of left knee M25.662  Decreased strength of lower extremity R29.898  Altered gait R26.9   INFUSION TREATMENT  Sickle cell disease, type SS (HC Code) [facility] CODE) D57.1  General InformationTherapy Episode of Care  Date of Visit:  08/13/2020   Treatment Number:  3/30   Date the Treatment Plan was Initiated/Reviewed:  07/30/2020  Start of Care Date:  07/30/2020   Progress Report Due Date:  08/28/2020   MD Order Renewal Date:  08/28/2020 Precautions/Limitations   Precautions/Limitations:  Standard precautionsMedication Review:Current Outpatient Medications Medication Sig ? cefuroxime Take 1 tablet (500 mg total) by mouth every 12 (twelve) hours. (Patient not taking: Reported on 06/22/2020) ? doxycycline hyclate Take 1 tablet (100 mg total) by mouth every 12 (twelve) hours. (Patient not taking: Reported on 06/22/2020) ? hydroxyurea 3 tab daily for blood. ? ibuprofen Up to 4 tablets daily as needed for pain. ? Miscellaneous Medical Supply Heel lift Left shoeUse as directed. ? naloxone Use 1 spray in 1 nostril for suspected opioid overdose. May repeat in 2 minutes in other nostril with new device if minimal or no response. ? oxyCODONE 1 - 3 tablets every 3 h as needed for pain. ? oxyCODONE myristate Take 1 capsule (13.5 mg total) by mouth every 12 (twelve) hours. Long-acting medicine to control pain. 340b. D57.1. For prior authorization Fax [phone]. ? senna Take 2 tablets (17.2 mg total) by mouth every 3 (three) hours as needed for constipation. (Patient not taking: Reported on 02/11/2020) SubjectivePatient feels the same as last visit, no changes.ObjectiveTreatment Provided This VisitCPT Code Interventions Timed Minutes Untimed Minutes Total Minutes Therapeutic Exercise (97110) [place] lvl 4 5 minTG, Modified L squat 3 x 10Supine Bridges 2 x 10Standing HS curls, RTB, 2 x 10 LStanding Hip abd with RTB 1 x 10 BSplit squat from floor 2 x 5 B 15  15 Neuromuscular Re-Education (03474)     N/A     N/A       Total Treatment Time: 30 AssessmentPatient has poor single leg standing tolerance, fatigues easily during his exercise program. Patient education on strengthening and doing exercises until he feels some soreness to work on Music therapist.PlanFrequency:  1x per weekPlan for Next VisitContinue with Biodex,  Continue lunges, single leg stability Clovia Cuff, PTA

## 2020-08-20 ENCOUNTER — Inpatient Hospital Stay
Admit: 2020-08-20 | Discharge: 2020-08-20 | Payer: BLUE CROSS/BLUE SHIELD | Primary: Student in an Organized Health Care Education/Training Program

## 2020-08-20 DIAGNOSIS — S72362A Displaced segmental fracture of shaft of left femur, initial encounter for closed fracture: Secondary | ICD-10-CM

## 2020-08-20 DIAGNOSIS — R269 Unspecified abnormalities of gait and mobility: Secondary | ICD-10-CM

## 2020-08-20 DIAGNOSIS — D571 Sickle-cell disease without crisis: Secondary | ICD-10-CM

## 2020-08-20 DIAGNOSIS — R29898 Other symptoms and signs involving the musculoskeletal system: Secondary | ICD-10-CM

## 2020-08-20 LAB — BASIC METABOLIC PANEL
BKR BLOOD UREA NITROGEN: 10 mg/dL (ref 6–20)
BKR BUN / CREAT RATIO: 13 % (ref 8.0–23.0)
BKR CALCIUM: 9.2 mg/dL (ref 8.8–10.2)
BKR CHLORIDE: 106 mmol/L (ref 98–107)
BKR CO2: 22 mmol/L — ABNORMAL HIGH (ref 20–30)
BKR CREATININE: 0.77 mg/dL (ref 0.40–1.30)
BKR EGFR (AFR AMER): >60 mL/min/{1.73_m2} (ref >60)
BKR EGFR (NON AFRICAN AMERICAN): >60 mL/min/{1.73_m2} (ref >60)
BKR GLUCOSE: 102 mg/dL — ABNORMAL HIGH (ref 70–100)
BKR POTASSIUM: 4 mmol/L (ref 3.3–5.3)
BKR SODIUM: 139 mmol/L (ref 136–144)

## 2020-08-20 LAB — HEPATIC FUNCTION PANEL
BKR A/G RATIO: 1.6 (ref 1.0–2.2)
BKR ALANINE AMINOTRANSFERASE (ALT): 17 U/L — ABNORMAL LOW (ref 9–59)
BKR ALBUMIN: 4.7 g/dL (ref 3.6–4.9)
BKR ALKALINE PHOSPHATASE: 86 U/L (ref 9–122)
BKR ANION GAP: 3 g/dL (ref 2.3–3.5)
BKR ASPARTATE AMINOTRANSFERASE (AST): 37 U/L — ABNORMAL HIGH (ref 10–35)
BKR AST/ALT RATIO: 2.2 mmol/L — ABNORMAL LOW (ref 3.3–5.3)
BKR BILIRUBIN DIRECT: 0.4 mg/dL — ABNORMAL HIGH (ref <=0.3)
BKR BILIRUBIN TOTAL: 2 mg/dL — ABNORMAL HIGH (ref <=1.2)
BKR GLOBULIN: 3 g/dL (ref 2.3–3.5)
BKR PROTEIN TOTAL: 7.7 g/dL (ref 6.6–8.7)

## 2020-08-20 LAB — CBC WITH AUTO DIFFERENTIAL
BKR WAM ABSOLUTE IMMATURE GRANULOCYTES.: 0.05 x 1000/??L (ref 0.00–0.30)
BKR WAM ABSOLUTE LYMPHOCYTE COUNT.: 2.67 x 1000/??L (ref 0.60–3.70)
BKR WAM ABSOLUTE NRBC (2 DEC): 0.4 x 1000/??L (ref 0.00–1.00)
BKR WAM ANALYZER ANC: 8.2 x 1000/??L — ABNORMAL HIGH (ref 2.00–7.60)
BKR WAM BASOPHIL ABSOLUTE COUNT.: 0.1 x 1000/??L (ref 0.00–1.00)
BKR WAM BASOPHILS: 0.8 % (ref 0.0–1.4)
BKR WAM EOSINOPHIL ABSOLUTE COUNT.: 0.52 x 1000/??L (ref 0.00–1.00)
BKR WAM EOSINOPHILS: 4 % (ref 0.0–5.0)
BKR WAM HEMATOCRIT (2 DEC): 22.8 % — ABNORMAL LOW (ref 38.50–50.00)
BKR WAM HEMOGLOBIN: 8.1 g/dL — ABNORMAL LOW (ref 13.2–17.1)
BKR WAM IMMATURE GRANULOCYTES: 0.4 % (ref 0.0–1.0)
BKR WAM LYMPHOCYTES: 20.8 % (ref 17.0–50.0)
BKR WAM MCH (PG): 34.3 pg — ABNORMAL HIGH (ref 27.0–33.0)
BKR WAM MCHC: 35.5 g/dL (ref 31.0–36.0)
BKR WAM MCV: 96.6 fL (ref 80.0–100.0)
BKR WAM MONOCYTE ABSOLUTE COUNT.: 1.31 x 1000/??L — ABNORMAL HIGH (ref 0.00–1.00)
BKR WAM MONOCYTES: 10.2 % (ref 4.0–12.0)
BKR WAM MPV: 8.6 fL (ref 8.0–12.0)
BKR WAM NEUTROPHILS: 63.8 % (ref 39.0–72.0)
BKR WAM NUCLEATED RED BLOOD CELLS: 3 % — ABNORMAL HIGH (ref 0.0–1.0)
BKR WAM PLATELETS: 681 x1000/??L — ABNORMAL HIGH (ref 150–420)
BKR WAM RDW-CV: 21.4 % — ABNORMAL HIGH (ref 11.0–15.0)
BKR WAM RED BLOOD CELL COUNT.: 2.36 M/??L — ABNORMAL LOW (ref 4.00–6.00)
BKR WAM WHITE BLOOD CELL COUNT: 12.9 x1000/??L — ABNORMAL HIGH (ref 4.0–11.0)

## 2020-08-20 LAB — RETICULOCYTES
BKR WAM IRF: 37.1 % — ABNORMAL HIGH (ref 3.0–15.9)
BKR WAM RETICULOCYTE - ABS (3 DEC): 0.321 10??6 cells/uL — ABNORMAL HIGH (ref 0.023–0.140)
BKR WAM RETICULOCYTE COUNT PCT (1 DEC): 13.6 % — ABNORMAL HIGH (ref 0.6–2.7)
BKR WAM RETICULOCYTE HGB EQUIVALENT: 36.5 pg — ABNORMAL HIGH (ref 28.2–35.7)

## 2020-08-20 LAB — C-REACTIVE PROTEIN     (CRP): BKR C-REACTIVE PROTEIN, HIGH SENSITIVITY: 4.5 mg/L — ABNORMAL HIGH

## 2020-08-21 ENCOUNTER — Encounter
Admit: 2020-08-21 | Payer: PRIVATE HEALTH INSURANCE | Primary: Student in an Organized Health Care Education/Training Program

## 2020-08-27 ENCOUNTER — Inpatient Hospital Stay
Admit: 2020-08-27 | Discharge: 2020-08-27 | Payer: BLUE CROSS/BLUE SHIELD | Primary: Student in an Organized Health Care Education/Training Program

## 2020-08-27 DIAGNOSIS — R269 Unspecified abnormalities of gait and mobility: Secondary | ICD-10-CM

## 2020-08-27 DIAGNOSIS — D571 Sickle-cell disease without crisis: Secondary | ICD-10-CM

## 2020-08-27 DIAGNOSIS — M25662 Stiffness of left knee, not elsewhere classified: Secondary | ICD-10-CM

## 2020-08-27 DIAGNOSIS — S72362A Displaced segmental fracture of shaft of left femur, initial encounter for closed fracture: Secondary | ICD-10-CM

## 2020-08-27 DIAGNOSIS — R29898 Other symptoms and signs involving the musculoskeletal system: Secondary | ICD-10-CM

## 2020-08-27 NOTE — Other
REHABILITATION SERVICES AT [facility] Services At [facility] [place] [address] Number: [phone] Number: [id number] Therapy Daily NotePatient Name:  Phillip RiceMedical Record Number:  [id number] Date of Birth:  07-04-1997Therapist:  Clovia Bell, PTAReferring Provider:  Sheppard Bell, PAICD-10 Diagnosis(es):Problem List         ICD-10-CM   PT - S/P ORIF (L) femur fx - July '21  Closed displaced segmental fracture of shaft of left femur, initial encounter (HC Code) (HC CODE) S72.362A  Decreased range of motion (ROM) of left knee M25.662  Decreased strength of lower extremity R29.898  Altered gait R26.9   INFUSION TREATMENT  Sickle cell disease, type SS (HC Code) [facility] CODE) D57.1  General InformationTherapy Episode of Care  Date of Visit:  08/13/2020   Treatment Number:  4/30   Date the Treatment Plan was Initiated/Reviewed:  07/30/2020  Start of Care Date:  07/30/2020   Progress Report Due Date:  08/28/2020   MD Order Renewal Date:  08/28/2020 Precautions/Limitations   Precautions/Limitations:  Standard precautionsMedication Review:Current Outpatient Medications Medication Sig ? cefuroxime Take 1 tablet (500 mg total) by mouth every 12 (twelve) hours. (Patient not taking: Reported on 06/22/2020) ? doxycycline hyclate Take 1 tablet (100 mg total) by mouth every 12 (twelve) hours. (Patient not taking: Reported on 06/22/2020) ? hydroxyurea 3 tab daily for blood. ? ibuprofen Up to 4 tablets daily as needed for pain. ? Miscellaneous Medical Supply Heel lift Left shoeUse as directed. ? naloxone Use 1 spray in 1 nostril for suspected opioid overdose. May repeat in 2 minutes in other nostril with new device if minimal or no response. ? oxyCODONE 1 - 3 tablets every 3 h as needed for pain. ? oxyCODONE myristate Take 1 capsule (13.5 mg total) by mouth every 12 (twelve) hours. Long-acting medicine to control pain. 340b. D57.1. For prior authorization Fax [phone]. ? senna Take 2 tablets (17.2 mg total) by mouth every 3 (three) hours as needed for constipation. (Patient not taking: Reported on 02/11/2020) Subjective18 min late for apptSore at the front of the thigh where the break is.  Not sure if I have to go for surgery to get rod and screw removed.  Appt with MD on March 8thObjectiveTreatment Provided This VisitCPT Code Interventions Timed Minutes Untimed Minutes Total Minutes Therapeutic Exercise (97110) TRX sit backs 2 x 10Side step with red tband 38ft x 2Step lunge 36ft x 2BIODEX Limits of stability training lvl1  Random training x 60s, Not today- [place] lvl 4 5 minSupine Bridges 2 x 10Standing HS curls, RTB, 2 x 10 LStanding Hip abd with RTB 1 x 10 BSplit squat from floor 2 x 5 B 15  15 Neuromuscular Re-Education (66063)     N/A     N/A       Total Treatment Time: 15 AssessmentLate arrival - limited activity today.  Continues to demonstrate reduced stability on left with lunges.   Reinforced education on strengthening and doing exercises until he feels some soreness to work on Music therapist.PlanFrequency:  1x per weekPlan for Next VisitProgress note -Continue with Biodex,  Continue lunges, single leg stability Phillip Bell, PT

## 2020-08-27 NOTE — Other
REHABILITATION SERVICES AT [facility] Services At [facility] [place] [address] Number: [phone] Number: [phone] Therapy Daily NotePatient Name:  Phillip RiceMedical Record Number:  [id number] Date of Birth:  06-Aug-1997Therapist:  Clovia Cuff, PTAReferring Provider:  Sheppard Plumber, PAICD-10 Diagnosis(es):Problem List         ICD-10-CM   PT - S/P ORIF (L) femur fx - July '21  Closed displaced segmental fracture of shaft of left femur, initial encounter (HC Code) (HC CODE) S72.362A  Decreased range of motion (ROM) of left knee M25.662  Decreased strength of lower extremity R29.898  Altered gait R26.9   INFUSION TREATMENT  Sickle cell disease, type SS (HC Code) [facility] CODE) D57.1  General InformationTherapy Episode of Care  Date of Visit:  08/13/2020   Treatment Number:  4/30   Date the Treatment Plan was Initiated/Reviewed:  07/30/2020  Start of Care Date:  07/30/2020   Progress Report Due Date:  08/28/2020   MD Order Renewal Date:  08/28/2020 Precautions/Limitations   Precautions/Limitations:  Standard precautionsMedication Review:Current Outpatient Medications Medication Sig ? cefuroxime Take 1 tablet (500 mg total) by mouth every 12 (twelve) hours. (Patient not taking: Reported on 06/22/2020) ? doxycycline hyclate Take 1 tablet (100 mg total) by mouth every 12 (twelve) hours. (Patient not taking: Reported on 06/22/2020) ? hydroxyurea 3 tab daily for blood. ? ibuprofen Up to 4 tablets daily as needed for pain. ? Miscellaneous Medical Supply Heel lift Left shoeUse as directed. ? naloxone Use 1 spray in 1 nostril for suspected opioid overdose. May repeat in 2 minutes in other nostril with new device if minimal or no response. ? oxyCODONE 1 - 3 tablets every 3 h as needed for pain. ? oxyCODONE myristate Take 1 capsule (13.5 mg total) by mouth every 12 (twelve) hours. Long-acting medicine to control pain. 340b. D57.1. For prior authorization Fax [phone]. ? senna Take 2 tablets (17.2 mg total) by mouth every 3 (three) hours as needed for constipation. (Patient not taking: Reported on 02/11/2020) SubjectiveFeeling tired, no changes.ObjectiveTreatment Provided This VisitCPT Code Interventions Timed Minutes Untimed Minutes Total Minutes Therapeutic Exercise (97110) [place] lvl 4 5 minBIODEX Limits of stability training lvl1 , 2 Maze conTG, Modified L squat 3 x 10TRX sit backs 2 x 10TRX Single leg squat x 10Supine Bridges 2 x 10Standing HS curls, RTB, 2 x 10 LStanding Hip abd with RTB 1 x 10 BSplit squat from floor 2 x 5 B 30  30 Neuromuscular Re-Education (63016)     N/A     N/A       Total Treatment Time: 30 AssessmentImproved stabilization on left.  Reinforced education on strengthening and doing exercises until he feels some soreness to work on Music therapist.PlanFrequency:  1x per weekPlan for Next VisitContinue with Biodex,  Continue lunges, single leg stability [name], PT

## 2020-08-31 DIAGNOSIS — M25662 Stiffness of left knee, not elsewhere classified: Secondary | ICD-10-CM

## 2020-08-31 DIAGNOSIS — D571 Sickle-cell disease without crisis: Secondary | ICD-10-CM

## 2020-08-31 DIAGNOSIS — R269 Unspecified abnormalities of gait and mobility: Secondary | ICD-10-CM

## 2020-09-01 ENCOUNTER — Telehealth
Admit: 2020-09-01 | Payer: PRIVATE HEALTH INSURANCE | Attending: Hematology & Oncology | Primary: Student in an Organized Health Care Education/Training Program

## 2020-09-01 ENCOUNTER — Encounter
Admit: 2020-09-01 | Payer: PRIVATE HEALTH INSURANCE | Attending: Family | Primary: Student in an Organized Health Care Education/Training Program

## 2020-09-01 DIAGNOSIS — D571 Sickle-cell disease without crisis: Secondary | ICD-10-CM

## 2020-09-01 MED ORDER — OXYCODONE ER 13.5 MG CAPSULE SPRINKLE EXTEND RELEASE 12HR(DON'T CRUSH)
13.5 mg | ORAL_CAPSULE | Freq: Two times a day (BID) | ORAL | 1 refills | Status: SS
Start: 2020-09-01 — End: 2020-09-24

## 2020-09-01 MED ORDER — OXYCODONE IMMEDIATE RELEASE 15 MG TABLET
15 mg | ORAL_TABLET | 1 refills | Status: SS
Start: 2020-09-01 — End: 2020-09-24

## 2020-09-01 NOTE — Telephone Encounter
Patient calling in for medication refill on hydroxyurea (HYDREA) 500 mg capsule, oxyCODONE (ROXICODONE) 15 mg Immediate Release tablet andoxyCODONE myristate (XTAMPZA ER) 13.5 mg 12 hr extended release sprinkle capsule. Pt uses [facility]. Pt would also like a letter from [place] his condition for disability. He is currently applying for disability

## 2020-09-03 ENCOUNTER — Inpatient Hospital Stay
Admit: 2020-09-03 | Discharge: 2020-09-03 | Payer: BLUE CROSS/BLUE SHIELD | Primary: Student in an Organized Health Care Education/Training Program

## 2020-09-03 DIAGNOSIS — M25662 Stiffness of left knee, not elsewhere classified: Secondary | ICD-10-CM

## 2020-09-03 DIAGNOSIS — S72362A Displaced segmental fracture of shaft of left femur, initial encounter for closed fracture: Secondary | ICD-10-CM

## 2020-09-03 DIAGNOSIS — R269 Unspecified abnormalities of gait and mobility: Secondary | ICD-10-CM

## 2020-09-03 DIAGNOSIS — R29898 Other symptoms and signs involving the musculoskeletal system: Secondary | ICD-10-CM

## 2020-09-08 ENCOUNTER — Inpatient Hospital Stay
Admit: 2020-09-08 | Discharge: 2020-09-08 | Payer: BLUE CROSS/BLUE SHIELD | Primary: Student in an Organized Health Care Education/Training Program

## 2020-09-08 ENCOUNTER — Encounter
Admit: 2020-09-08 | Payer: PRIVATE HEALTH INSURANCE | Attending: Orthopaedic Trauma | Primary: Student in an Organized Health Care Education/Training Program

## 2020-09-08 ENCOUNTER — Encounter
Admit: 2020-09-08 | Payer: BLUE CROSS/BLUE SHIELD | Attending: Orthopaedic Trauma | Primary: Student in an Organized Health Care Education/Training Program

## 2020-09-08 DIAGNOSIS — Z7964 On hydroxyurea therapy: Secondary | ICD-10-CM

## 2020-09-08 DIAGNOSIS — IMO0002 Dactylitis: Secondary | ICD-10-CM

## 2020-09-08 DIAGNOSIS — G8929 Other chronic pain: Secondary | ICD-10-CM

## 2020-09-08 DIAGNOSIS — K219 Gastro-esophageal reflux disease without esophagitis: Secondary | ICD-10-CM

## 2020-09-08 DIAGNOSIS — D5700 Hb-SS disease with crisis, unspecified: Secondary | ICD-10-CM

## 2020-09-08 DIAGNOSIS — Z23 Encounter for immunization: Secondary | ICD-10-CM

## 2020-09-08 DIAGNOSIS — H902 Conductive hearing loss, unspecified: Secondary | ICD-10-CM

## 2020-09-08 DIAGNOSIS — S72362A Displaced segmental fracture of shaft of left femur, initial encounter for closed fracture: Secondary | ICD-10-CM

## 2020-09-08 DIAGNOSIS — D571 Sickle-cell disease without crisis: Secondary | ICD-10-CM

## 2020-09-08 DIAGNOSIS — M87052 Idiopathic aseptic necrosis of left femur: Secondary | ICD-10-CM

## 2020-09-08 DIAGNOSIS — D7389 Other diseases of spleen: Secondary | ICD-10-CM

## 2020-09-08 DIAGNOSIS — D6189 Other specified aplastic anemias and other bone marrow failure syndromes: Secondary | ICD-10-CM

## 2020-09-08 NOTE — Progress Notes
Review of Systems Constitutional: Negative.  HENT: Negative.  Eyes: Negative.  Respiratory: Negative.  Cardiovascular: Negative.  Gastrointestinal: Negative.  Endocrine: Negative.  Genitourinary: Negative.  Musculoskeletal:      F/u LUE pain 3/10 with activity Skin: Negative.  Allergic/Immunologic: Negative.  Neurological: Negative.  Hematological: Negative.

## 2020-09-08 NOTE — Progress Notes
.   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  He has a sickle cell anemic patient.  He does have occasionally sickle cell crises as well.?He recently was seen with failure of his interlocking bolts and collapse of his fracture with significant resolution of his pain.  He states that his femur bone is still painful and he has pain particularly with any type of torsional activity to his left thigh   ?Physical examination:This is a pleasant male appropriate with the examination conversant and not toxic?Examination of his left hip demonstrates no overt signs of skin at risk, there is no obvious drainage from his surgical wounds.  He has maintenance of his overall alignment to his left lower extremity without any significant deformity compared to the contralateral side?He is able to extend and flex his knee extend and flex his hip.?He does have pain when he ambulates with his left leg.?Sensation is grossly preserved to his left footCV: regularLungs: clearAbd: soft?Radiographs were reviewed which demonstrate interlocking bolt failure fixation of his intramedullary nail construct.  His distal interlocking bolt has now failed as well.  There is a persistent fracture line consistent with that of an atrophic nonunionImpression:Atrophic nonunion left femurPlan repair left femur nonunion, osteo periosteal flap with plate fixation and compression.  Nail extractionWill book the patient accordingly?

## 2020-09-08 NOTE — Other
REHABILITATION SERVICES AT [facility] [place] [id number] Number: [phone] Number: [phone] Therapy Plan of Care3/3/2022Patient Name:  Phillip RiceMR#:  [id number] Date of Birth:  [birth year]Referring Provider:  Sheppard Plumber, PAReferring Provider NPI:  [id number] Therapist:  [name], PTProblem List         ICD-10-CM   PT - S/P ORIF (L) femur fx - July '21  Closed displaced segmental fracture of shaft of left femur, initial encounter (HC Code) (HC CODE) S72.362A  Decreased range of motion (ROM) of left knee M25.662  Decreased strength of lower extremity R29.898  Altered gait R26.9   INFUSION TREATMENT  Sickle cell disease, type SS (HC Code) (HC CODE) D57.1  Your signature indicates that you have reviewed and agree with the established Plan of Care dated 09/03/2020.  This document serves to support medical necessity for continued outpatient Physical Therapy services until 09/24/20 for in-person or telehealth services..Please co-sign this document electronically or return via the fax number listed on the document.Additional Physician Comments:___________________________________     __________________________________Physician Signature                                           Date___________________________________Physician Printed NamePhysical Therapy Orthopedic Progress Note3/3/2022Patient Name:  Phillip RiceMedical Record Number:  [id number] Date of Birth:  [birth year]Therapist:  [name], PTReferring Provider:  Sheppard Plumber, PAICD-10 Diagnosis(es):Problem List         ICD-10-CM   PT - S/P ORIF (L) femur fx - July '21  Closed displaced segmental fracture of shaft of left femur, initial encounter (HC Code) (HC CODE) S72.362A  Decreased range of motion (ROM) of left knee M25.662  Decreased strength of lower extremity R29.898  Altered gait R26.9   INFUSION TREATMENT  Sickle cell disease, type SS (HC Code) [facility] CODE) D57.1  General InformationTherapy Episode of Care  Date of Visit:  09/03/2020   Treatment Number:  5/30   Date the Treatment Plan was Initiated/Reviewed:  09/03/2020  Start of Care Date:  07/30/2020   Onset of Illness/Injury Date:  12/20/2019   Date of Surgery:  12/21/2019   Progress Report Due Date:  10/02/2020   MD Order Renewal Date:  10/02/2020 Precautions/Limitations   Precautions/Limitations:  No known precautions/limitations   Precautions/Limitations Comments:  Sickle cell disease with (L) hip and chronic pain pre-injuryNew Neurological Deficit   Did the patient have a new neurological deficit as evidenced by diminished muscle strength   of less than 3 at any time during the 90 days after spine surgery:  [employer] Utilized?  NoCognition / Learning Assessment   Primary Learner Relationship:  Patient        Barriers to learning:  No barriers        Preferred language:  English        Preferred learning style:  Listening, Demonstration, Reading and Pictures/VideoI reviewed the Patient Care Agreement and Attendance Form with the Patient/Family.  The Patient/Family verbalized understanding.Medication Review:Current Outpatient Medications Medication Sig ? cefuroxime Take 1 tablet (500 mg total) by mouth every 12 (twelve) hours. (Patient not taking: Reported on 06/22/2020) ? doxycycline hyclate Take 1 tablet (100 mg total) by mouth every 12 (twelve) hours. (Patient not taking: Reported on 06/22/2020) ? hydroxyurea 3 tab daily for blood. ? ibuprofen Up to 4 tablets daily as needed for pain. ? Miscellaneous Medical Supply Heel lift Left shoeUse as directed. ? naloxone Use  1 spray in 1 nostril for suspected opioid overdose. May repeat in 2 minutes in other nostril with new device if minimal or no response. ? oxyCODONE 1 - 3 tablets every 3 h as needed for pain. ? oxyCODONE myristate Take 1 capsule (13.5 mg total) by mouth every 12 (twelve) hours. Long-acting medicine to control pain. 340b. D57.1. For prior authorization Fax [phone]. ? senna Take 2 tablets (17.2 mg total) by mouth every 3 (three) hours as needed for constipation. (Patient not taking: Reported on 02/11/2020) SubjectiveFeeling OK -  Still feel weak on that side.  Pain is only at the front of the leg.  5/10 when it comes on.  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  September to 1 month ago able to work.  Got too painful and cold to continue and no longer working in that position.   Looking for something differentPain Rating:  5 / 10 Now: 0 / 10 Best; 6-7 / 10   Comments: (L) Hip painPast Medical HistoryPast Medical History: Diagnosis Date ? Aplastic crisis (HC Code) (HC CODE) 6/6/ 2005 transfusion ? Avascular necrosis of femur head, left (HC Code) (HC CODE)  ? Chronic pain   sickle cell ? Conductive hearing loss 09/22/14 P.E tubes placed ? Dactylitis one episode  April, 1998 ? GERD (gastroesophageal reflux disease)  ? 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 On hydroxyurea therapy started October 2013 ? Pneumococcal vaccination given 01/20/2006 and 09/28/2012 ? 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 Vasoocclusive sickle cell crisis (HC Code) (HC CODE) Adm 01/02/2012, ER 04/08/12, 4/1-10/03/12, 7/2-01/05/13, 12/02/13-12/03/13 , March 2016 - 2 admissions Not taking any pain medication -  Take the medication for sickle cell anemia.  Past Surgical HistoryPast Surgical History: Procedure Laterality Date ? CHOLECYSTECTOMY, LAPAROSCOPIC  1/15 ? TYMPANOSTOMY TUBE PLACEMENT  09/22/2014  by Dr. Bernita Raisin AllergiesPatient has no known allergies.Social / Emotional Information: active young manPrior Level of Function: Use elastic band on my bed for exerciseUnrestricted but deals with chronic pain issues    Change in Status from prior level of function?  yesObjectivePosture: Sits with poorly maintained lumbar lordosisROM/MMT: ROM          Left          MMT ROM        Right         MMT Hip   Flexion   125 pain at end range               4    WNL                            5 Extension   NT beyond 0                  3+    WNL                            5 Abduction   30 min pain                   3-    WNL  5 Adduction  N/T    WNL                            5 Internal Rotation   35 min pain                   4- Painful    WNL                            5 External Rotation   52                                4- Painful    WNL Knee   Flexion   135 deg pain                 4+   135 deg                        5 Extension    0 deg           4+ avg soreness    0 deg                          5 Ankle/Foot   Dorsiflexion   WNL                            5   WNL                            5 Plantarflexion   WNL                            5    WNL                            5 Inversion   WNL                            5   WNL                            5 Eversion   WNL                            5   WNL                            5 Great Toe Extension   WNL                            5   WNL                            5 Palpation/Joint Mobility:Good patellar mobilitySkin/Sensation:Skin intact with surgical scars well healedFunctional Mobility/Balance:Bed Mobility/Transfers:  IndependentAmbulation/Gait Assessment:  Antalgic gait with listing to left - No AD used at clinic today.    Orthopedic Tools/Scales/Outcome MeasuresPatient Specific Functional Scale (  PSFS)   Activity #1 Score:  8      Details:   Up & down 13 steps to his room   Activity #2 Score:  6      Details:   Walk w/o crutches (changed to cane   Activity #3 Score:  9      Details:   Get in/out of car comfortably   TOTAL SCORE:  7.67   (PSFS Scoring:  0=Extreme difficulty completing task     10=Performs task easily)   Comments:  Previous 6Lower Extremity Functional Scale      Any of your usual work/household/school activities:  2      Your usual hobbies/recreation/sporting activities:  2      Getting into or out of the bath:  3      Walking between rooms:  3      Putting on your socks:   4      Squatting:  2      Lifting an object from the floor:  4      Performing light activities around your home:  3      Performing heavy activities around your home:  1      Getting into or out of your car:  4      Walking 2 blocks:  2      Walking a mile:  1      Go up or down 10 stairs:  3      Standing for 1 hour:  1      Sitting for 1 hour:  2      Running on even ground:  1      Running on uneven ground:  1      Making sharp turns while running fast:  1      Hopping:  1      Rolling over in bed:  3   Total Score (out of a possible 80):  44   76-78/80 is normal for population   Minimal detectable change = 9 points   Minimal clinically important difference = 9 points   Potential error +/- 5.3 points   Comments:  Not retested this visit due to time constraintsTreatment Provided This VisitCPT Code Interventions Timed Minutes Untimed Minutes Total Minutes Therapeutic Exercise (97110) PN MeasurementsSit backs with TRXSLS with straight postureSLR 4 ways x 10ITB stretch Hamstring stretchGastroc stretch 30  30 Gait Training ([address])  -  - N/A     N/A       Total Treatment Time: 30 Problem ListWeakness to L hipDecreased ROMDecreased strengthMod pain in hipFunctional deficits including disturbed sleep, difficulty with car and bed transfers, ambulation in community, etc per [facility] continues to exhibit some left hip weakness and questionable compliance to HEP.  He has difficulty with SLR after 10 reps he can continue to display glute medius weakness 3-/5.  Stressed the importance of compliance with HEP in order to return to PLOF.  He will benefit from continued PT to improve his gait, increase strength and reduce pain to optimize functional level.  Patient / Family / Caregiver EducationReviewed the assessmentDiscussed plan of care and rationalePatient/Family/Caregiver demonstrate agreement with the plan - Access Code: [email or web address]: [email or web address]: 01/27/2022Prepared by: Nakoa Ganus MARTUCCIExercisesSupine Quad Set - 1 x daily - 7 x weekly - 3 sets - 10 repsStraight Leg Raise with External Rotation - 1 x daily - 7 x weekly - 3 sets - 10 repsStanding ITB Stretch - 2 x daily - 7 x weekly -  1 sets - 3 reps - 20 holdStanding Hamstring Stretch on Chair - 2 x daily - 7 x weekly - 3 sets - 1 reps - 20 holdStanding Gastroc Stretch on Step - 2 x daily - 7 x weekly - 3 sets - 1 reps - 20 holdWall Squat - 1 x daily - 7 x weekly - 3 sets - 10 repsSidelying Hip Adduction - 1 x daily - 7 x weekly - 3 sets - 10 repsProne Hip Extension - 1 x daily - 7 x weekly - 3 sets - 10 repsStanding Single Leg Stance with Counter Support - 1 x daily - 7 x weekly - 3 sets - 10 repsSingle Leg Balance with Knee Flexion - 1 x daily - 7 x weekly - 3 sets - 10 repsRehab PotentialGoodPlanTherapeutic Exercise (97110)Manual Therapy (97140)Therapeutic Activity (97530)Self-Care/Home Management (97535)Gait Training (97116)Neuromuscular Re-Education (97112)Heat - Ice Pack Frequency:  2x per week live and/or virtual visitsDuration:  4 weeksPlan for Next VisitProgress strengthening;  Biodex to improve WB on leftGoalsShort Term Goals  (4-6 weeks):   1. Reduce knee/leg pain to 0-4/10 without need for narcotic meds beyond his baseline Partially met2. Able to resume community ambulation without crutches demonstrating good quad control met3. Demonstrates full AROM (L)knee with good NM control of ant/post/med/lat muscle groups Met4. SLR without lag to improve push-off in gait cycle Met5. Able to get in/out of car and bed with ease Partially met Long Term Goals (12 weeks): 1. Able to squat and lift 50 lb with symmetrical movement without pain X50 Not met2. Able to perform 30 step-ups with full TKE w/o valgus deformity Not met3. Demonstrates good flexibility of LE's Met4. Independent with HEP - not met5. Demonstrates good gait pattern without compensations all surfaces Partially metJoseph Lyliana Dicenso, PT

## 2020-09-10 ENCOUNTER — Inpatient Hospital Stay
Admit: 2020-09-10 | Discharge: 2020-09-10 | Payer: BLUE CROSS/BLUE SHIELD | Primary: Student in an Organized Health Care Education/Training Program

## 2020-09-10 DIAGNOSIS — R269 Unspecified abnormalities of gait and mobility: Secondary | ICD-10-CM

## 2020-09-10 DIAGNOSIS — R29898 Other symptoms and signs involving the musculoskeletal system: Secondary | ICD-10-CM

## 2020-09-10 DIAGNOSIS — M25662 Stiffness of left knee, not elsewhere classified: Secondary | ICD-10-CM

## 2020-09-10 DIAGNOSIS — S72362A Displaced segmental fracture of shaft of left femur, initial encounter for closed fracture: Secondary | ICD-10-CM

## 2020-09-14 ENCOUNTER — Inpatient Hospital Stay
Admit: 2020-09-14 | Discharge: 2020-09-14 | Payer: BLUE CROSS/BLUE SHIELD | Primary: Student in an Organized Health Care Education/Training Program

## 2020-09-14 ENCOUNTER — Ambulatory Visit
Admit: 2020-09-14 | Payer: BLUE CROSS/BLUE SHIELD | Attending: Family | Primary: Student in an Organized Health Care Education/Training Program

## 2020-09-14 ENCOUNTER — Encounter
Admit: 2020-09-14 | Payer: PRIVATE HEALTH INSURANCE | Attending: Family | Primary: Student in an Organized Health Care Education/Training Program

## 2020-09-14 DIAGNOSIS — D571 Sickle-cell disease without crisis: Secondary | ICD-10-CM

## 2020-09-14 DIAGNOSIS — Z01818 Encounter for other preprocedural examination: Secondary | ICD-10-CM

## 2020-09-14 DIAGNOSIS — S8992XD Unspecified injury of left lower leg, subsequent encounter: Secondary | ICD-10-CM

## 2020-09-14 DIAGNOSIS — D5700 Hb-SS disease with crisis, unspecified: Secondary | ICD-10-CM

## 2020-09-14 LAB — HEPATIC FUNCTION PANEL
BKR A/G RATIO: 1.7 (ref 1.0–2.2)
BKR ALANINE AMINOTRANSFERASE (ALT): 18 U/L (ref 9–59)
BKR ALBUMIN: 4.5 g/dL (ref 3.6–4.9)
BKR ALKALINE PHOSPHATASE: 80 U/L (ref 9–122)
BKR ASPARTATE AMINOTRANSFERASE (AST): 32 U/L (ref 10–35)
BKR AST/ALT RATIO: 1.8 10??6 cells/uL — ABNORMAL HIGH (ref 0.023–0.140)
BKR BILIRUBIN DIRECT: 0.3 mg/dL (ref <=0.3)
BKR BILIRUBIN TOTAL: 3.5 mg/dL — ABNORMAL HIGH (ref <=1.2)
BKR GLOBULIN: 2.6 g/dL (ref 2.3–3.5)
BKR PROTEIN TOTAL: 7.1 g/dL — ABNORMAL HIGH (ref 6.6–8.7)

## 2020-09-14 LAB — CBC WITH AUTO DIFFERENTIAL
BKR CHLORIDE: 98.6 fL (ref 80.0–100.0)
BKR WAM ABSOLUTE IMMATURE GRANULOCYTES.: 0.04 x 1000/??L (ref 0.00–0.30)
BKR WAM ABSOLUTE LYMPHOCYTE COUNT.: 2.92 x 1000/??L (ref 0.60–3.70)
BKR WAM ABSOLUTE NRBC (2 DEC): 0.3 x 1000/??L (ref 0.00–1.00)
BKR WAM ANALYZER ANC: 2.44 x 1000/??L (ref 2.00–7.60)
BKR WAM BASOPHIL ABSOLUTE COUNT.: 0.09 x 1000/??L (ref 0.00–1.00)
BKR WAM BASOPHILS: 1.3 % (ref 0.0–1.4)
BKR WAM EOSINOPHIL ABSOLUTE COUNT.: 0.38 x 1000/??L (ref 0.00–1.00)
BKR WAM EOSINOPHILS: 5.3 % — ABNORMAL HIGH (ref 0.0–5.0)
BKR WAM HEMATOCRIT (2 DEC): 21.6 % — ABNORMAL LOW (ref 38.50–50.00)
BKR WAM HEMOGLOBIN: 7.8 g/dL — ABNORMAL LOW (ref 13.2–17.1)
BKR WAM IMMATURE GRANULOCYTES: 0.6 % (ref 0.0–1.0)
BKR WAM LYMPHOCYTES: 40.7 % (ref 17.0–50.0)
BKR WAM MCH (PG): 35.6 pg — ABNORMAL HIGH (ref 27.0–33.0)
BKR WAM MCHC: 36.1 g/dL — ABNORMAL HIGH (ref 31.0–36.0)
BKR WAM MCV: 98.6 fL (ref 80.0–100.0)
BKR WAM MONOCYTE ABSOLUTE COUNT.: 1.31 x 1000/??L — ABNORMAL HIGH (ref 0.00–1.00)
BKR WAM MONOCYTES: 18.2 % — ABNORMAL HIGH (ref 4.0–12.0)
BKR WAM MPV: 9.2 fL (ref 8.0–12.0)
BKR WAM NUCLEATED RED BLOOD CELLS: 3.5 % — ABNORMAL HIGH (ref 0.0–1.0)
BKR WAM PLATELETS: 342 x1000/??L (ref 150–420)
BKR WAM RDW-CV: 20.7 % — ABNORMAL HIGH (ref 11.0–15.0)
BKR WAM RED BLOOD CELL COUNT.: 2.19 M/??L — ABNORMAL LOW (ref 4.00–6.00)
BKR WAM WHITE BLOOD CELL COUNT: 7.2 x1000/??L (ref 4.0–11.0)

## 2020-09-14 LAB — BASIC METABOLIC PANEL
BKR ANION GAP: 11 (ref 7–17)
BKR BLOOD UREA NITROGEN: 10 mg/dL (ref 6–20)
BKR BUN / CREAT RATIO: 12.5 % (ref 8.0–23.0)
BKR CALCIUM: 9.1 mg/dL (ref 8.8–10.2)
BKR CO2: 24 mmol/L — ABNORMAL HIGH (ref 20–30)
BKR CREATININE: 0.8 mg/dL (ref 0.40–1.30)
BKR EGFR (AFR AMER): >60 mL/min/{1.73_m2} — ABNORMAL HIGH (ref >60)
BKR EGFR (NON AFRICAN AMERICAN): >60 mL/min/{1.73_m2} (ref >60)
BKR GLUCOSE: 110 mg/dL — ABNORMAL HIGH (ref 70–100)
BKR POTASSIUM: 3.9 mmol/L (ref 3.3–5.3)
BKR SODIUM: 141 mmol/L (ref 136–144)
BKR WAM NEUTROPHILS: 9.1 mg/dL — ABNORMAL LOW (ref 8.8–10.2)

## 2020-09-14 LAB — RETICULOCYTES
BKR WAM IRF: 29.3 % — ABNORMAL HIGH (ref 3.0–15.9)
BKR WAM RETICULOCYTE - ABS (3 DEC): 0.257 10??6 cells/uL — ABNORMAL HIGH (ref 0.023–0.140)
BKR WAM RETICULOCYTE COUNT PCT (1 DEC): 11.7 % — ABNORMAL HIGH (ref 0.6–2.7)
BKR WAM RETICULOCYTE HGB EQUIVALENT: 35.1 pg (ref 28.2–35.7)

## 2020-09-14 LAB — PROTIME AND INR
BKR INR: 1.1 g/dL — ABNORMAL HIGH (ref 0.92–1.08)
BKR PROTHROMBIN TIME: 11.6 s — ABNORMAL HIGH (ref 9.8–11.4)

## 2020-09-15 ENCOUNTER — Telehealth
Admit: 2020-09-15 | Payer: PRIVATE HEALTH INSURANCE | Primary: Student in an Organized Health Care Education/Training Program

## 2020-09-15 ENCOUNTER — Ambulatory Visit
Admit: 2020-09-15 | Payer: PRIVATE HEALTH INSURANCE | Primary: Student in an Organized Health Care Education/Training Program

## 2020-09-15 NOTE — Telephone Encounter
I spoke with [facility] and informed him he needs to get labs done to check blood counts. He is going for Covid testing in [place] on Fri and will get labs drawn as well. He verbalized understanding.

## 2020-09-15 NOTE — Telephone Encounter
Phillip Bell is going for surgery to femur on Mon. Per Nadene Rubins APRN needs 2uRBC's. I spoke with [facility] and they will kindly treat [name] after he gets out of work. He can be here by 4pm. Type and Screen is done. [name] notified and aware/confirmed time. He appreciated the accommodation.

## 2020-09-16 ENCOUNTER — Encounter
Admit: 2020-09-16 | Payer: PRIVATE HEALTH INSURANCE | Attending: Family | Primary: Student in an Organized Health Care Education/Training Program

## 2020-09-16 ENCOUNTER — Inpatient Hospital Stay
Admit: 2020-09-16 | Discharge: 2020-09-16 | Payer: BLUE CROSS/BLUE SHIELD | Primary: Student in an Organized Health Care Education/Training Program

## 2020-09-16 DIAGNOSIS — D5700 Hb-SS disease with crisis, unspecified: Secondary | ICD-10-CM

## 2020-09-16 DIAGNOSIS — D571 Sickle-cell disease without crisis: Secondary | ICD-10-CM

## 2020-09-17 ENCOUNTER — Inpatient Hospital Stay
Admit: 2020-09-17 | Discharge: 2020-09-17 | Payer: BLUE CROSS/BLUE SHIELD | Primary: Student in an Organized Health Care Education/Training Program

## 2020-09-17 DIAGNOSIS — D571 Sickle-cell disease without crisis: Secondary | ICD-10-CM

## 2020-09-17 MED ORDER — OXYCODONE IMMEDIATE RELEASE 5 MG TABLET
5 mg | Freq: Once | ORAL | Status: CP
Start: 2020-09-17 — End: ?
  Administered 2020-09-17: 21:00:00 5 mg via ORAL

## 2020-09-17 MED ORDER — SODIUM CHLORIDE 0.9 % (FLUSH) INJECTION SYRINGE
0.9 % | INTRAVENOUS | Status: DC | PRN
Start: 2020-09-17 — End: 2020-09-21

## 2020-09-17 MED ORDER — OXYCODONE IMMEDIATE RELEASE 5 MG TABLET
5 mg | Freq: Once | ORAL | Status: CP
Start: 2020-09-17 — End: ?
  Administered 2020-09-17: 23:00:00 5 mg via ORAL

## 2020-09-17 MED ORDER — SODIUM CHLORIDE 0.9 % (FLUSH) INJECTION SYRINGE
0.9 % | Freq: Three times a day (TID) | INTRAVENOUS | Status: DC
Start: 2020-09-17 — End: 2020-09-21

## 2020-09-17 NOTE — Progress Notes
Pt arrived to [facility] ambulatory w/ cane. Pt was referred to [facility] for 2U RBCs. Pt arrived A/Ox4, BP 124/72  - Pulse (!) 97  - Temp 98.7 ?F (37.1 ?C) (Oral)  - Resp 18  - SpO2 98% on room air. LPIV #20 placed. Type and screen previously drawn. Written consent obtained in [facility]. 15mg  PO oxy given per home regimen for hip/leg pain. 1st unit given, pt tolerated well, no s/s reaction, VSS throughout. 2nd unit started, initial vitals stable, pt tolerating well so far. Additional 5mg  oxy given. Report given to RN Kandis Mannan for ongoing care.

## 2020-09-18 ENCOUNTER — Inpatient Hospital Stay
Admit: 2020-09-18 | Discharge: 2020-09-18 | Payer: BLUE CROSS/BLUE SHIELD | Primary: Student in an Organized Health Care Education/Training Program

## 2020-09-18 ENCOUNTER — Telehealth
Admit: 2020-09-18 | Payer: PRIVATE HEALTH INSURANCE | Primary: Student in an Organized Health Care Education/Training Program

## 2020-09-18 ENCOUNTER — Encounter
Admit: 2020-09-18 | Payer: PRIVATE HEALTH INSURANCE | Attending: Family | Primary: Student in an Organized Health Care Education/Training Program

## 2020-09-18 ENCOUNTER — Ambulatory Visit
Admit: 2020-09-18 | Payer: PRIVATE HEALTH INSURANCE | Primary: Student in an Organized Health Care Education/Training Program

## 2020-09-18 ENCOUNTER — Inpatient Hospital Stay: Admit: 2020-09-18 | Payer: PRIVATE HEALTH INSURANCE | Attending: Anesthesiology

## 2020-09-18 ENCOUNTER — Encounter
Admit: 2020-09-18 | Payer: PRIVATE HEALTH INSURANCE | Attending: Orthopaedic Trauma | Primary: Student in an Organized Health Care Education/Training Program

## 2020-09-18 ENCOUNTER — Encounter
Admit: 2020-09-18 | Payer: PRIVATE HEALTH INSURANCE | Attending: Internal Medicine | Primary: Student in an Organized Health Care Education/Training Program

## 2020-09-18 DIAGNOSIS — D5700 Hb-SS disease with crisis, unspecified: Secondary | ICD-10-CM

## 2020-09-18 DIAGNOSIS — Z23 Encounter for immunization: Secondary | ICD-10-CM

## 2020-09-18 DIAGNOSIS — Z7964 On hydroxyurea therapy: Secondary | ICD-10-CM

## 2020-09-18 DIAGNOSIS — G8929 Other chronic pain: Secondary | ICD-10-CM

## 2020-09-18 DIAGNOSIS — IMO0002 Dactylitis: Secondary | ICD-10-CM

## 2020-09-18 DIAGNOSIS — Z01812 Encounter for preprocedural laboratory examination: Secondary | ICD-10-CM

## 2020-09-18 DIAGNOSIS — H902 Conductive hearing loss, unspecified: Secondary | ICD-10-CM

## 2020-09-18 DIAGNOSIS — D571 Sickle-cell disease without crisis: Secondary | ICD-10-CM

## 2020-09-18 DIAGNOSIS — D6189 Other specified aplastic anemias and other bone marrow failure syndromes: Secondary | ICD-10-CM

## 2020-09-18 DIAGNOSIS — S72362A Displaced segmental fracture of shaft of left femur, initial encounter for closed fracture: Secondary | ICD-10-CM

## 2020-09-18 DIAGNOSIS — K219 Gastro-esophageal reflux disease without esophagitis: Secondary | ICD-10-CM

## 2020-09-18 DIAGNOSIS — Z20822 Contact with and (suspected) exposure to covid-19: Secondary | ICD-10-CM

## 2020-09-18 DIAGNOSIS — M87052 Idiopathic aseptic necrosis of left femur: Secondary | ICD-10-CM

## 2020-09-18 DIAGNOSIS — D7389 Other diseases of spleen: Secondary | ICD-10-CM

## 2020-09-18 LAB — CBC WITH AUTO DIFFERENTIAL
BKR WAM ABSOLUTE IMMATURE GRANULOCYTES.: 0.08 x 1000/??L (ref 0.00–0.30)
BKR WAM ABSOLUTE LYMPHOCYTE COUNT.: 2.92 x 1000/??L (ref 0.60–3.70)
BKR WAM ABSOLUTE NRBC (2 DEC): 0.2 x 1000/??L (ref 0.00–1.00)
BKR WAM ANALYZER ANC: 5.86 x 1000/??L (ref 2.00–7.60)
BKR WAM BASOPHIL ABSOLUTE COUNT.: 0.06 x 1000/??L (ref 0.00–1.00)
BKR WAM BASOPHILS: 0.6 % (ref 0.0–1.4)
BKR WAM EOSINOPHIL ABSOLUTE COUNT.: 0.26 x 1000/??L (ref 0.00–1.00)
BKR WAM EOSINOPHILS: 2.4 % (ref 0.0–5.0)
BKR WAM HEMATOCRIT (2 DEC): 25.1 % — ABNORMAL LOW (ref 38.50–50.00)
BKR WAM HEMOGLOBIN: 8.9 g/dL — ABNORMAL LOW (ref 13.2–17.1)
BKR WAM IMMATURE GRANULOCYTES: 0.7 % (ref 0.0–1.0)
BKR WAM LYMPHOCYTES: 27.1 % (ref 17.0–50.0)
BKR WAM MCH (PG): 34 pg — ABNORMAL HIGH (ref 27.0–33.0)
BKR WAM MCHC: 35.5 g/dL (ref 31.0–36.0)
BKR WAM MCV: 95.8 fL (ref 80.0–100.0)
BKR WAM MONOCYTE ABSOLUTE COUNT.: 1.58 x 1000/??L — ABNORMAL HIGH (ref 0.00–1.00)
BKR WAM MONOCYTES: 14.7 % — ABNORMAL HIGH (ref 4.0–12.0)
BKR WAM MPV: 9.2 fL (ref 8.0–12.0)
BKR WAM NEUTROPHILS: 54.5 % (ref 39.0–72.0)
BKR WAM NUCLEATED RED BLOOD CELLS: 2.2 % — ABNORMAL HIGH (ref 0.0–1.0)
BKR WAM PLATELETS: 476 x1000/??L — ABNORMAL HIGH (ref 150–420)
BKR WAM RDW-CV: 21.7 % — ABNORMAL HIGH (ref 11.0–15.0)
BKR WAM RED BLOOD CELL COUNT.: 2.62 M/??L — ABNORMAL LOW (ref 4.00–6.00)
BKR WAM WHITE BLOOD CELL COUNT: 10.8 x1000/??L (ref 4.0–11.0)

## 2020-09-18 NOTE — Telephone Encounter
Leven contacted and informed his HCT - 25.10 after his 2units PRBC's yesterday and per Nadene Rubins APRN should be 28+ to have his surgery on Mon. He has reluctantly agreed to come in 3/19 for a unit of blood. Stressed the importance of coming.

## 2020-09-18 NOTE — Progress Notes
Re: Phillip RiceMRN: [id number] DOB: Jan 19, 1997Date of Service: 3/17/2022Referring [facility] Provider: Leota Sauers, MDChief Complaint: No chief complaint on file.HPI: The patient is a 25 y.o. 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 disease, type SS who is sent to the [facility] for Transfusion  by Referral clinic: [facility] clinic.ROS: 12-point review of systems conducted and is negative except for those noted.Patient is receiving transfusion for surgical intervention of his left femur on Monday.  Been requested that he receive 2 units of packed cells prior to surgery.Patient tolerated transfusion well discharged home in stable conditionPE:  General:  Alert and oriented x3Lungs:  Clear bilaterallyHeart:  S1-S2 regularAbdomen:  Soft nontenderExtremities:  Left leg pain unable to bear weight wellLaboratory results:Recent Labs Lab 03/14/221405 WBC 7.2 HGB 7.8* HCT 21.60* PLT 342  Recent Labs Lab 03/14/221405 NEUTROPHILS 33.9*  Recent Labs Lab 03/14/221405 NA 141 K 3.9 CL 106 CO2 24 BUN 10 CREATININE 0.80 GLU 110* ANIONGAP 11  Recent Labs Lab 03/14/221405 CALCIUM 9.1  Recent Labs Lab 03/14/221405 ALT 18 AST 32 ALKPHOS 80 BILITOT 3.5* BILIDIR 0.3 PROT 7.1 ALBUMIN 4.5  Recent Labs Lab 03/14/221405 LABPROT 11.6* INR 1.10*  Assessment/Plan:Anemia/preoperative transfusion2 units packed cellsDischarged home in stableOncology Extended Care Clinic Discharge DiagnosisSupportive Care: AnemiaOncology Extended Care Clinic DispositionDischarged HomeReceived sign out from [facility], APRN around 7:00PMHe tolerated blood transfusion well and was discharged home. After care blood transfusion care instructions provided.

## 2020-09-18 NOTE — Other
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 Sabra Heck, LAPAROSCOPIC		1/15		Review NP: Jacqulyn Liner

## 2020-09-18 NOTE — Patient Instructions
Blood Transfusion, Adult, Care After This sheet gives you information about how to care for yourself after your procedure. Your doctor may also give you more specific instructions. If you have problems or questions, contact your doctor. What can I expect after the procedure? After the procedure, it is common to have:  Bruising and soreness at the IV site.  A fever or chills on the day of the procedure. This may be your body's response to the new blood cells received.  A headache. Follow these instructions at home: Insertion site care  Follow instructions from your doctor about how to take care of your insertion site. This is where an IV tube was put into your vein. Make sure you: ? Wash your hands with soap and water before and after you change your bandage (dressing). If you cannot use soap and water, use hand sanitizer. ? Change your bandage as told by your doctor.  Check your insertion site every day for signs of infection. Check for: ? Redness, swelling, or pain. ? Bleeding from the site. ? Warmth. ? Pus or a bad smell.      General instructions  Take over-the-counter and prescription medicines only as told by your doctor.  Rest as told by your doctor.  Go back to your normal activities as told by your doctor.  Keep all follow-up visits as told by your doctor. This is important. Contact a doctor if:  You have itching or red, swollen areas of skin (hives).  You feel worried or nervous (anxious).  You feel weak after doing your normal activities.  You have redness, swelling, warmth, or pain around the insertion site.  You have blood coming from the insertion site, and the blood does not stop with pressure.  You have pus or a bad smell coming from the insertion site. Get help right away if:  You have signs of a serious reaction. This may be coming from an allergy or the body's defense system (immune system). Signs include: ? Trouble breathing or shortness of  breath. ? Swelling of the face or feeling warm (flushed). ? Fever or chills. ? Head, chest, or back pain. ? Dark pee (urine) or blood in the pee. ? Widespread rash. ? Fast heartbeat. ? Feeling dizzy or light-headed. You may receive your blood transfusion in an outpatient setting. If so, you will be told whom to contact to report any reactions. These symptoms may be an emergency. Do not wait to see if the symptoms will go away. Get medical help right away. Call your local emergency services (911 in the [place] Do not drive yourself to the hospital. Summary  Bruising and soreness at the IV site are common.  Check your insertion site every day for signs of infection.  Rest as told by your doctor. Go back to your normal activities as told by your doctor.  Get help right away if you have signs of a serious reaction. This information is not intended to replace advice given to you by your health care provider. Make sure you discuss any questions you have with your health care provider. Document Revised: 12/13/2018 Document Reviewed: 12/13/2018 Elsevier Patient Education  2021 [employer]vier Inc.  

## 2020-09-19 ENCOUNTER — Encounter
Admit: 2020-09-19 | Payer: PRIVATE HEALTH INSURANCE | Attending: Family | Primary: Student in an Organized Health Care Education/Training Program

## 2020-09-19 ENCOUNTER — Inpatient Hospital Stay
Admit: 2020-09-19 | Discharge: 2020-09-19 | Payer: BLUE CROSS/BLUE SHIELD | Primary: Student in an Organized Health Care Education/Training Program

## 2020-09-19 DIAGNOSIS — D571 Sickle-cell disease without crisis: Secondary | ICD-10-CM

## 2020-09-19 DIAGNOSIS — Z79899 Other long term (current) drug therapy: Secondary | ICD-10-CM

## 2020-09-19 LAB — COVID-19 CLEARANCE OR FOR PLACEMENT ONLY: BKR SARS-COV-2 RNA (COVID-19) (YH): NEGATIVE

## 2020-09-19 MED ORDER — OXYCODONE IMMEDIATE RELEASE 5 MG TABLET
5 mg | Freq: Once | ORAL | Status: CP
Start: 2020-09-19 — End: ?
  Administered 2020-09-19: 14:00:00 5 mg via ORAL

## 2020-09-19 NOTE — Progress Notes
Pt arrived for 1 unit of PRBC due to hct of 25.1. Pt scheduled for surgery Monday and needs hct above 28. PIV placed in LFA with good blood return. Pt consented for blood at chairside by provider. Pt given 1 unit of blood as ordered without issue. Pt denies nausea, vomiting, diarrhea or constipation. Pt given oxycodone 15mg  PO immediate release for left leg pain 6/10. Pt felt improved relief after medication took affect. PIV flushed and de-accessed. Gauze and coban applied. Pt left in stable condition in care of self with use of cain.

## 2020-09-21 ENCOUNTER — Inpatient Hospital Stay
Admit: 2020-09-21 | Discharge: 2020-09-21 | Payer: BLUE CROSS/BLUE SHIELD | Source: Home / Self Care | Admitting: Orthopaedic Trauma

## 2020-09-21 ENCOUNTER — Encounter
Admit: 2020-09-21 | Payer: PRIVATE HEALTH INSURANCE | Attending: Anesthesiology | Primary: Student in an Organized Health Care Education/Training Program

## 2020-09-21 ENCOUNTER — Inpatient Hospital Stay
Admit: 2020-09-21 | Discharge: 2020-09-24 | Payer: BLUE CROSS/BLUE SHIELD | Source: Home / Self Care | Admitting: Orthopaedic Trauma

## 2020-09-21 ENCOUNTER — Inpatient Hospital Stay: Admit: 2020-09-21 | Payer: PRIVATE HEALTH INSURANCE | Attending: Anesthesiology

## 2020-09-21 ENCOUNTER — Inpatient Hospital Stay: Admit: 2020-09-21 | Payer: PRIVATE HEALTH INSURANCE

## 2020-09-21 DIAGNOSIS — S72352K Displaced comminuted fracture of shaft of left femur, subsequent encounter for closed fracture with nonunion: Secondary | ICD-10-CM

## 2020-09-21 LAB — CBC WITH AUTO DIFFERENTIAL
BKR WAM ABSOLUTE IMMATURE GRANULOCYTES.: 0.05 x 1000/ÂµL (ref 0.00–0.30)
BKR WAM ABSOLUTE LYMPHOCYTE COUNT.: 0.8 x 1000/ÂµL (ref 0.60–3.70)
BKR WAM ABSOLUTE NRBC (2 DEC): 0 x 1000/ÂµL (ref 0.00–1.00)
BKR WAM ANALYZER ANC: 11.07 x 1000/ÂµL — ABNORMAL HIGH (ref 2.00–7.60)
BKR WAM BASOPHIL ABSOLUTE COUNT.: 0.02 x 1000/ÂµL (ref 0.00–1.00)
BKR WAM BASOPHILS: 0.1 % (ref 0.0–1.4)
BKR WAM EOSINOPHIL ABSOLUTE COUNT.: 0 x 1000/ÂµL (ref 0.00–1.00)
BKR WAM EOSINOPHILS: 0 % (ref 0.0–5.0)
BKR WAM HEMATOCRIT (2 DEC): 22.9 % — ABNORMAL LOW (ref 38.50–50.00)
BKR WAM HEMOGLOBIN: 7.6 g/dL — ABNORMAL LOW (ref 13.2–17.1)
BKR WAM IMMATURE GRANULOCYTES: 0.4 % (ref 0.0–1.0)
BKR WAM LYMPHOCYTES: 5.8 % — ABNORMAL LOW (ref 17.0–50.0)
BKR WAM MCH (PG): 31.4 pg (ref 27.0–33.0)
BKR WAM MCHC: 33.2 g/dL (ref 31.0–36.0)
BKR WAM MCV: 94.6 fL (ref 80.0–100.0)
BKR WAM MONOCYTE ABSOLUTE COUNT.: 1.78 x 1000/ÂµL — ABNORMAL HIGH (ref 0.00–1.00)
BKR WAM MONOCYTES: 13 % — ABNORMAL HIGH (ref 4.0–12.0)
BKR WAM MPV: 9.2 fL (ref 8.0–12.0)
BKR WAM NEUTROPHILS: 80.7 % — ABNORMAL HIGH (ref 39.0–72.0)
BKR WAM NUCLEATED RED BLOOD CELLS: 0.2 % (ref 0.0–1.0)
BKR WAM PLATELETS: 469 x1000/ÂµL — ABNORMAL HIGH (ref 150–420)
BKR WAM RDW-CV: 19 % — ABNORMAL HIGH (ref 11.0–15.0)
BKR WAM RED BLOOD CELL COUNT.: 2.42 M/ÂµL — ABNORMAL LOW (ref 4.00–6.00)
BKR WAM WHITE BLOOD CELL COUNT: 13.7 x1000/ÂµL — ABNORMAL HIGH (ref 4.0–11.0)

## 2020-09-21 LAB — BASIC METABOLIC PANEL
BKR ANION GAP: 8 (ref 7–17)
BKR BLOOD UREA NITROGEN: 16 mg/dL (ref 6–20)
BKR BUN / CREAT RATIO: 21.1 (ref 8.0–23.0)
BKR CALCIUM: 8.4 mg/dL — ABNORMAL LOW (ref 8.8–10.2)
BKR CHLORIDE: 105 mmol/L (ref 98–107)
BKR CO2: 24 mmol/L (ref 20–30)
BKR CREATININE: 0.76 mg/dL (ref 0.40–1.30)
BKR EGFR (AFR AMER): >60 mL/min/{1.73_m2} (ref >60)
BKR EGFR (NON AFRICAN AMERICAN): >60 mL/min/{1.73_m2} (ref >60)
BKR GLUCOSE: 131 mg/dL — ABNORMAL HIGH (ref 70–100)
BKR POTASSIUM: 4.8 mmol/L (ref 3.3–5.3)
BKR SODIUM: 137 mmol/L (ref 136–144)

## 2020-09-21 MED ORDER — FENTANYL (PF) 50 MCG/ML INJECTION SOLUTION
50 mcg/mL | INTRAVENOUS | Status: DC | PRN
Start: 2020-09-21 — End: 2020-09-21

## 2020-09-21 MED ORDER — DEXMEDETOMIDINE 100 MCG/ML INTRAVENOUS SOLUTION
100 mcg/mL | Status: CP
Start: 2020-09-21 — End: ?

## 2020-09-21 MED ORDER — MIDAZOLAM (PF) 1 MG/ML INJECTION SOLUTION
1 mg/mL | INTRAVENOUS | Status: DC | PRN
Start: 2020-09-21 — End: 2020-09-21
  Administered 2020-09-21: 11:00:00 1 mg/mL via INTRAVENOUS

## 2020-09-21 MED ORDER — DROPERIDOL 2.5 MG/ML INJECTION SOLUTION
2.5 mg/mL | INTRAVENOUS | Status: DC | PRN
Start: 2020-09-21 — End: 2020-09-21
  Administered 2020-09-21: 14:00:00 2.5 mg/mL via INTRAVENOUS

## 2020-09-21 MED ORDER — BISACODYL 5 MG TABLET,DELAYED RELEASE
5 mg | Freq: Every day | ORAL | Status: DC | PRN
Start: 2020-09-21 — End: 2020-09-24

## 2020-09-21 MED ORDER — ACETAMINOPHEN 1,000 MG/100 ML (10 MG/ML) INTRAVENOUS SOLUTION
10 mg/mL | Status: CP
Start: 2020-09-21 — End: ?

## 2020-09-21 MED ORDER — TRANEXAMIC ACID 1,000 MG/10 ML (100 MG/ML) INTRAVENOUS SOLUTION
1000 mg/10 mL (100 mg/mL) | Status: CP
Start: 2020-09-21 — End: ?

## 2020-09-21 MED ORDER — ONDANSETRON HCL (PF) 4 MG/2 ML INJECTION SOLUTION
4 mg/2 mL | Freq: Once | INTRAVENOUS | Status: DC | PRN
Start: 2020-09-21 — End: 2020-09-24

## 2020-09-21 MED ORDER — HYDROMORPHONE 0.5 MG/0.5 ML INJECTION SYRINGE
0.5 mg/ mL | Status: CP
Start: 2020-09-21 — End: ?

## 2020-09-21 MED ORDER — MIDAZOLAM 1 MG/ML INJECTION SOLUTION
1 mg/mL | Status: CP
Start: 2020-09-21 — End: ?

## 2020-09-21 MED ORDER — DROPERIDOL 2.5 MG/ML INJECTION SOLUTION
2.5 mg/mL | Status: CP
Start: 2020-09-21 — End: ?

## 2020-09-21 MED ORDER — CEFAZOLIN 1 GRAM SOLUTION FOR INJECTION
1 gram | INTRAVENOUS | Status: DC | PRN
Start: 2020-09-21 — End: 2020-09-21
  Administered 2020-09-21 (×2): 1 gram via INTRAVENOUS

## 2020-09-21 MED ORDER — KETAMINE 50 MG/ML (1 ML) INTRAVENOUS SYRINGE
50 mg/mL (1 mL) | Status: CP
Start: 2020-09-21 — End: ?

## 2020-09-21 MED ORDER — DEXAMETHASONE SODIUM PHOSPHATE (PF) 10 MG/ML INJECTION SOLUTION
10 mg/mL | PERINEURAL | Status: DC | PRN
Start: 2020-09-21 — End: 2020-09-21
  Administered 2020-09-21 (×2): 10 mg/mL via PERINEURAL

## 2020-09-21 MED ORDER — LIDOCAINE (PF) 20 MG/ML (2 %) INJECTION SOLUTION
20 mg/mL (2 %) | INTRAVENOUS | Status: DC | PRN
Start: 2020-09-21 — End: 2020-09-21
  Administered 2020-09-21: 12:00:00 20 mg/mL (2 %) via INTRAVENOUS

## 2020-09-21 MED ORDER — DIAZEPAM 2 MG TABLET
2 mg | Freq: Four times a day (QID) | ORAL | Status: DC | PRN
Start: 2020-09-21 — End: 2020-09-24

## 2020-09-21 MED ORDER — FENTANYL (PF) 50 MCG/ML INJECTION SOLUTION
50 mcg/mL | Status: CP
Start: 2020-09-21 — End: ?

## 2020-09-21 MED ORDER — SODIUM CHLORIDE 0.9 % INTRAVENOUS SOLUTION
INTRAVENOUS | Status: DC
Start: 2020-09-21 — End: 2020-09-21

## 2020-09-21 MED ORDER — FENTANYL (PF) 50 MCG/ML INJECTION SOLUTION
50 mcg/mL | INTRAVENOUS | Status: DC | PRN
Start: 2020-09-21 — End: 2020-09-21
  Administered 2020-09-21 (×7): 50 mcg/mL via INTRAVENOUS

## 2020-09-21 MED ORDER — DEXAMETHASONE SODIUM PHOSPHATE 4 MG/ML INJECTION SOLUTION
4 mg/mL | Freq: Once | INTRAVENOUS | Status: DC | PRN
Start: 2020-09-21 — End: 2020-09-21

## 2020-09-21 MED ORDER — ENOXAPARIN 40 MG/0.4 ML SUBCUTANEOUS SYRINGE
40 mg/0.4 mL | SUBCUTANEOUS | Status: DC
Start: 2020-09-21 — End: 2020-09-24
  Administered 2020-09-22 – 2020-09-24 (×3): 40 mg/0.4 mL via SUBCUTANEOUS

## 2020-09-21 MED ORDER — SODIUM CHLORIDE 0.9 % IRRIGATION SOLUTION
0.9 % irrigation | Status: CP | PRN
Start: 2020-09-21 — End: ?
  Administered 2020-09-21: 13:00:00 0.9 % irrigation

## 2020-09-21 MED ORDER — BUPIVACAINE (PF) 0.5 % (5 MG/ML) INJECTION SOLUTION
0.5 % (5 mg/mL) | PERINEURAL | Status: DC | PRN
Start: 2020-09-21 — End: 2020-09-21
  Administered 2020-09-21 (×2): 0.5 % (5 mg/mL) via PERINEURAL

## 2020-09-21 MED ORDER — ACETAMINOPHEN 325 MG TABLET
325 mg | Freq: Four times a day (QID) | ORAL | Status: DC
Start: 2020-09-21 — End: 2020-09-24
  Administered 2020-09-21 – 2020-09-24 (×11): 325 mg via ORAL

## 2020-09-21 MED ORDER — KETOROLAC 15 MG/ML INJECTION SOLUTION
15 mg/mL | Freq: Four times a day (QID) | INTRAVENOUS | Status: CP | PRN
Start: 2020-09-21 — End: ?
  Administered 2020-09-22 – 2020-09-23 (×4): 15 mL via INTRAVENOUS

## 2020-09-21 MED ORDER — GLYCOPYRROLATE 0.2 MG/ML INJECTION SOLUTION
0.2 mg/mL | INTRAVENOUS | Status: DC | PRN
Start: 2020-09-21 — End: 2020-09-21
  Administered 2020-09-21 (×3): 0.2 mg/mL via INTRAVENOUS

## 2020-09-21 MED ORDER — KETOROLAC 30 MG/ML (1 ML) INJECTION SOLUTION
30 mg/mL (1 mL) | Status: CP
Start: 2020-09-21 — End: ?

## 2020-09-21 MED ORDER — HYDROMORPHONE 0.5 MG/0.5 ML INJECTION SYRINGE
0.50.5 mg/ mL | INTRAVENOUS | Status: DC | PRN
Start: 2020-09-21 — End: 2020-09-21
  Administered 2020-09-21 (×7): 0.5 mL via INTRAVENOUS

## 2020-09-21 MED ORDER — ONDANSETRON HCL (PF) 4 MG/2 ML INJECTION SOLUTION
4 mg/2 mL | INTRAVENOUS | Status: DC | PRN
Start: 2020-09-21 — End: 2020-09-21
  Administered 2020-09-21: 18:00:00 4 mg/2 mL via INTRAVENOUS

## 2020-09-21 MED ORDER — SODIUM CHLORIDE 0.9 % (FLUSH) INJECTION SYRINGE
0.9 % | INTRAVENOUS | Status: DC | PRN
Start: 2020-09-21 — End: 2020-09-24

## 2020-09-21 MED ORDER — ONDANSETRON HCL (PF) 4 MG/2 ML INJECTION SOLUTION
4 mg/2 mL | Freq: Four times a day (QID) | INTRAVENOUS | Status: DC | PRN
Start: 2020-09-21 — End: 2020-09-24

## 2020-09-21 MED ORDER — SODIUM CHLORIDE 0.9 % INTRAVENOUS SOLUTION
Freq: Once | INTRAVENOUS | Status: DC
Start: 2020-09-21 — End: 2020-09-21

## 2020-09-21 MED ORDER — SENNOSIDES 8.6 MG-DOCUSATE SODIUM 50 MG TABLET
Freq: Two times a day (BID) | ORAL | Status: DC
Start: 2020-09-21 — End: 2020-09-24

## 2020-09-21 MED ORDER — ROCURONIUM 10 MG/ML INTRAVENOUS SOLUTION
10 mg/mL | INTRAVENOUS | Status: DC | PRN
Start: 2020-09-21 — End: 2020-09-21
  Administered 2020-09-21 (×5): 10 mg/mL via INTRAVENOUS

## 2020-09-21 MED ORDER — MEPERIDINE 25 MG/2.5 ML IN 0.9% SODIUM CHLORIDE
INTRAVENOUS | Status: DC | PRN
Start: 2020-09-21 — End: 2020-09-21

## 2020-09-21 MED ORDER — PROPOFOL 10 MG/ML INTRAVENOUS EMULSION
10 mg/mL | Status: CP
Start: 2020-09-21 — End: ?

## 2020-09-21 MED ORDER — CEFAZOLIN IV PUSH 1 GRAM VIAL & 0.9% SODIUM CHLORIDE (ADULT)
Freq: Once | INTRAVENOUS | Status: CP
Start: 2020-09-21 — End: ?
  Administered 2020-09-22: 01:00:00 10.000 mL via INTRAVENOUS

## 2020-09-21 MED ORDER — HYDROMORPHONE 2 MG/ML INJECTION SOLUTION
2 mg/mL | Status: CP
Start: 2020-09-21 — End: ?

## 2020-09-21 MED ORDER — KETAMINE 50 MG/ML INJECTION SOLUTION
50 mg/mL | INTRAVENOUS | Status: DC | PRN
Start: 2020-09-21 — End: 2020-09-21
  Administered 2020-09-21 (×2): 50 mg/mL via INTRAVENOUS

## 2020-09-21 MED ORDER — LIDOCAINE (PF) 20 MG/ML (2 %) INTRAVENOUS SOLUTION
20 mg/mL (2 %) | Status: CP
Start: 2020-09-21 — End: ?

## 2020-09-21 MED ORDER — ONDANSETRON 4 MG DISINTEGRATING TABLET
4 mg | Freq: Four times a day (QID) | ORAL | Status: DC | PRN
Start: 2020-09-21 — End: 2020-09-24

## 2020-09-21 MED ORDER — HYDROXYUREA 500 MG CAPSULE
500 mg | Freq: Every day | ORAL | Status: DC
Start: 2020-09-21 — End: 2020-09-24
  Administered 2020-09-22 – 2020-09-24 (×3): 500 mg via ORAL

## 2020-09-21 MED ORDER — ACETAMINOPHEN 325 MG TABLET
325 mg | ORAL | Status: DC | PRN
Start: 2020-09-21 — End: 2020-09-21

## 2020-09-21 MED ORDER — HYDROMORPHONE (DILAUDID) PCA 1 MG/ML (50 ML) YNH PYXIS
1 mg/ml | Status: CP
Start: 2020-09-21 — End: ?

## 2020-09-21 MED ORDER — NALOXONE 0.4 MG/ML INJECTION SOLUTION
0.4 mg/mL | INTRAVENOUS | Status: DC | PRN
Start: 2020-09-21 — End: 2020-09-24

## 2020-09-21 MED ORDER — POLYETHYLENE GLYCOL 3350 17 GRAM ORAL POWDER PACKET
17 gram | Freq: Every day | ORAL | Status: DC
Start: 2020-09-21 — End: 2020-09-24

## 2020-09-21 MED ORDER — BISACODYL 10 MG RECTAL SUPPOSITORY
10 mg | Freq: Every day | RECTAL | Status: DC | PRN
Start: 2020-09-21 — End: 2020-09-24

## 2020-09-21 MED ORDER — PROPOFOL 10 MG/ML INTRAVENOUS EMULSION
10 mg/mL | INTRAVENOUS | Status: DC | PRN
Start: 2020-09-21 — End: 2020-09-21
  Administered 2020-09-21: 12:00:00 10 mg/mL via INTRAVENOUS

## 2020-09-21 MED ORDER — NEOSTIGMINE METHYLSULFATE 1 MG/ML INTRAVENOUS SOLUTION
1 mg/mL | INTRAVENOUS | Status: DC | PRN
Start: 2020-09-21 — End: 2020-09-21
  Administered 2020-09-21 (×3): 1 mg/mL via INTRAVENOUS

## 2020-09-21 MED ORDER — EPINEPHRINE 1:100,000 (10 MCG/ML) INJECTION SOLUTION (OR USE ONLY)
Status: CP
Start: 2020-09-21 — End: ?

## 2020-09-21 MED ORDER — LACTATED RINGERS INTRAVENOUS SOLUTION
INTRAVENOUS | Status: DC | PRN
Start: 2020-09-21 — End: 2020-09-21
  Administered 2020-09-21: 12:00:00 via INTRAVENOUS

## 2020-09-21 MED ORDER — HYDROMORPHONE 2 MG/ML INJECTION SOLUTION
2 mg/mL | INTRAVENOUS | Status: DC | PRN
Start: 2020-09-21 — End: 2020-09-21
  Administered 2020-09-21 (×3): 2 mg/mL via INTRAVENOUS

## 2020-09-21 MED ORDER — NEOSTIGMINE METHYLSULFATE 1 MG/ML INTRAVENOUS SOLUTION
1 mg/mL | Status: CP
Start: 2020-09-21 — End: ?

## 2020-09-21 MED ORDER — HYDROMORPHONE (DILAUDID) PCA 1 MG/ML (50 ML) YNH PYXIS
1 mg/ml | INTRAVENOUS | Status: DC
Start: 2020-09-21 — End: 2020-09-23
  Administered 2020-09-21: 21:00:00 1 mL via INTRAVENOUS

## 2020-09-21 MED ORDER — METHYLPREDNISOLONE ACETATE 40 MG/ML SUSPENSION FOR INJECTION
40 mg/mL | Status: DC | PRN
Start: 2020-09-21 — End: 2020-09-21
  Administered 2020-09-21 (×2): 40 mg/mL

## 2020-09-21 MED ORDER — NALOXONE 0.4 MG/ML INJECTION SOLUTION
0.4 mg/mL | INTRAVENOUS | Status: DC | PRN
Start: 2020-09-21 — End: 2020-09-21

## 2020-09-21 MED ORDER — LACTATED RINGERS INTRAVENOUS SOLUTION
INTRAVENOUS | Status: DC
Start: 2020-09-21 — End: 2020-09-23
  Administered 2020-09-21 – 2020-09-23 (×5): 1000.000 mL/h via INTRAVENOUS

## 2020-09-21 MED ORDER — ONDANSETRON HCL (PF) 4 MG/2 ML INJECTION SOLUTION
4 mg/2 mL | INTRAVENOUS | Status: DC | PRN
Start: 2020-09-21 — End: 2020-09-21

## 2020-09-21 NOTE — Brief Op Note
 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 Item Serial No. Manufacturer Lot No. LRB No. Used Action NAIL EX FEM [id number] RTRGD AN - [id number] Implant NAIL EX FEM 10X460MM RTRGD AN  Ferol Luz AND JOHNSON [id number] Left 1 Explanted PUTTY PLIAFX DBM PUTTY 1CC - [id number] Implant PUTTY PLIAFX DBM PUTTY 1CC [id number] LIFENET [id number] Left 1 Implanted ALGRFT [facility] CELL MATRIX 1CC - [id number] Implant ALGRFT [facility] CELL MATRIX 1CC [id number] LIFENET [id number] Left 1 Implanted SCREW LOK 5X36MM F IM NL - [id number] Implant SCREW LOK [id number] F IM NL  J J [employer]  Left 1 Explanted SCREW LOK 5X36MM F IM NL - [id number] Implant SCREW LOK [id number] F IM NL  J J [employer] J[employer]ON  Left 1 Explanted SCREW LOK [id number] F IM NL - [id number] Implant SCREW LOK [id number] F IM NL  J J [employer] J[employer]  Left 1 Explanted PLATE 3.7SE 83TD NAR - [id number] Implant PLATE 1.7OH 60VP NAR  J J [employer] Left 1 Implanted PLATE 7.1GG 26RS BRD - [id number] Implant PLATE 0.9FG 18EX BRD  J J [employer] J[employer]  Left 1 Implanted SCREW CORT 4.5X34 SS ST FT LRG - [id number] Implant SCREW CORT 4.5X34 SS ST FT LRG  J J JOHNSON [employer] JOHNSON  Left 2 Implanted SCREW CORT 4.5X34 SS ST FT LRG - [id number] Implant SCREW CORT 4.5X34 SS ST FT LRG  J J [employer]NSON [employer] J[employer]ON  Left 1 Implanted and Explanted SCREW CORT 4.5X36 SS ST FT LRG - [id number] Implant SCREW CORT 4.5X36 SS ST FT LRG  J J [employer]  Left 1 Implanted SCREW CORT 4.5X36 SS ST FT LRG - [id number] Implant SCREW CORT 4.5X36 SS ST FT LRG  J J [employer]OHNSON [employer] J[employer]ON  Left 1 Implanted and Explanted SCREW CORT 4.5X38 SS ST FT LRG - [id number] Implant SCREW CORT 4.5X38 SS ST FT LRG  J J [employer]OHNSON [employer] J[employer]ON  Left 2 Implanted SCREW CORT 4.5X38 SS ST FT LRG - [id number] Implant SCREW CORT 4.5X38 SS ST FT LRG  J J [employer]  Left 1 Implanted and Explanted SCREW SELF TAP DHS X4.5MM - [id number] Implant SCREW SELF TAP DHS X4.5MM  J J [employer]ON [employer] J[employer]  Left 1 Implanted SCREW CORT 4.5X44 SS ST FT LRG - [id number] Implant SCREW CORT 4.5X44 SS ST FT LRG  J J J[employer]NSON [employer] J[employer]ON  Left 1 Implanted SCREW CORT 4.5X58MM SLF TAP - [id number] Implant SCREW CORT 4.5X58MM SLF TAP  J J JOHNSON AND JOHNSON N/A Left 1 Implanted  Specimens: ID Type Source Tests Collected by  Time 1 : removed hardware left femur Gross Only Hardware PATHOLOGY ([facility]) Luis Abed, MD 09/21/2020 12:49 PM  Intra-op Labs (16h ago, onward)   Start     Ordered  09/21/20 1252  Hardware or device cultures - Includes Gram Stain and Anaerobic Culture  [[id number]]  Once      Question Answer Comment Specimen Site: Not Applicable  Specimen Site Details (26 character limit): left femoral nail  Lab Specific Advisory Note: DO NOT PLACE ORDERS IN COMMENT FIELD  Release to patient Immediate    09/21/20 1252  09/21/20 1252  Fungal culture - Includes Fungal Stain  [[id number]]  Once      Question Answer Comment Specimen Site: Femur LT  Specimen Site Details (26 character limit): left femoral nail  Lab Specific Advisory Note: DO NOT PLACE ORDERS IN COMMENT FIELD  Release to patient Immediate    09/21/20 1252  09/21/20 0949  AFB culture - Includes AFB Stain - Do NOT PLACE AFB SAMPLES IN AN E-SWAB (Must go into sterile container only)  [[id number]]  Once      Comments: Swab specimens are NOT acceptable for AFB culture. All AFB cultures include smear. Question Answer Comment Specimen Site: Misc-specify in comments  Specimen Site Details (26 character limit): left femur bone  Lab Specific Advisory Note: DO NOT PLACE ORDERS IN COMMENT FIELD  Release to patient Immediate    09/21/20 0948  09/21/20 0949  Fungal culture - Includes Fungal Stain  [[id number]]  Once      Question Answer Comment Specimen Site: Misc - specify in comments  Specimen Site Details (26 character limit): left femur bone  Lab Specific Advisory Note: DO NOT PLACE ORDERS IN COMMENT FIELD  Release to patient Immediate    09/21/20 0948  09/21/20 0949  Tissue culture - Includes Gram Stain and Anaerobic Culture  [[id number]]  Once      Question Answer Comment Specimen Site: Not Specified  Specimen Site Details (26 character limit): left femur bone  Lab Specific Advisory Note: DO NOT PLACE ORDERS IN COMMENT FIELD  Release to patient Immediate    09/21/20 0948  09/21/20 3664  Tissue culture - Includes Gram Stain and Anaerobic Culture  [[id number]]  Once      Question Answer Comment Specimen Site: Not Specified  Specimen Site Details (26 character limit): nonunion left femur  Lab Specific Advisory Note: DO NOT PLACE ORDERS IN COMMENT FIELD  Release to patient Immediate    09/21/20 0940  09/21/20 0939  Fungal culture - Includes Fungal Stain  [[id number]]  Once      Question Answer Comment Specimen Site: Misc - specify in comments  Specimen Site Details (26 character limit): nonunion left femur  Lab Specific Advisory Note: DO NOT PLACE ORDERS IN COMMENT FIELD  Release to patient Immediate    09/21/20 0940  09/21/20 0939  AFB culture - Includes AFB Stain - Do NOT PLACE AFB SAMPLES IN AN E-SWAB (Must go into sterile container only)  [[id number]]  Once      Comments: Swab specimens are NOT acceptable for AFB culture. All AFB cultures include smear. Question Answer Comment Specimen Site: Misc-specify in comments  Specimen Site Details (26 character limit): nonunion left femur  Lab Specific Advisory Note: DO NOT PLACE ORDERS IN COMMENT FIELD  Release to patient Immediate    09/21/20 0940    Clinical Staging: n/aEBL: 400 mL       Post Operative Diagnosis: * No post-op diagnosis entered *25 yo M  s/p ROH left femur IM Nail. Revision ORIF plate fixation Left femoral shaft Nonunion Postop Plan: ?	Issues: hematology Reccs as follows:- Incentive spirometer every 2 hours -PCA hydromorphone 0.5 mg q 10 minutes with 1 hour lockout 3 mg. ? - Ketorolac 15 mg IV PRN -Bowel regimen to prevent narcotic induced constipation -Continue hydroxyurea 1500 mg daily ?	Activity: Mobilize with PT, weight bearing status TDWB LLE with crutches?	Consult placed to Pain team?	Consult placed to Hematology ?	Foley: SC in OR?	Dressings: Reinforce as needed until POD 2?	Antibiotics: Ancef x 24h?	Anticoagulation: LVX?	Xrays: intraop?	Pain control: PO.IV?	Discharge planning: home vs rehab per PT?	Drain: none?	Sutures/staples: sutures?	Follow-up: As scheduledFuture Appointments Date Time Provider Department Center 10/06/2020  9:00 AM Luis Abed, MD ORT [facility]  [facility] Sonia Stickels, MD3/21/20222:06 PM

## 2020-09-21 NOTE — Plan of Care
Plan of Care Overview/ Patient Status    Assumed care of pt at 1721. Pt AOx4. VSS on RA. Ace wrap and aquacel to LLE. +CMS to LLE. Pain controlled with Dilaudid PCA. Tolerating PO diet. DTV. Bedrest. Family updated and at bedside. Valuables @ bedside. Safety maintained. Plan for PT/OT. See flowsheet for more details.

## 2020-09-21 NOTE — Other
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 Revision ORIF plate fixation Left femoral shaft Nonunion Any Significant Events Intra-Op: noneAbnormal Assessment in PACU: 10/10 pain. Started on pcaLevel of Consciousness: awake, alert  and orientedLast Set of VS:  Vitals:  09/21/20 1537 09/21/20 1545 09/21/20 1600 09/21/20 1615 BP:  132/73 (!) 143/71 131/69 Pulse: 74 88 83 72 Resp: 14 14 14 14  Temp:     TempSrc:     SpO2: 99% 100% 100% 100% Weight:     Height:     Device (Oxygen Therapy): room air O2 Flow (L/min): 2Baseline Neuro/developmental Status: WDLLabs Collected: YesSpecial Needs of the Patient: noneAntibiotics: last dose given in OR: see MARIV Access: Periph IV-single(adult) 09/17/20 1626 over-the-needle catheter system 20 gauge Nurse (Active)   Periph IV-single(adult) 09/21/20 7829 over-the-needle catheter system 20 gauge Anesthesia Fellow (Active) SiteCare/Dressing Status/Securement dressing dry and intact 09/21/20 1500 Date Dressing Applied/Changed 09/21/20 09/21/20 0638 Next Date Dressing Change 09/28/2020 09/21/20 0638 Lumen 1 Patency/Care Patent;flushed w/o difficulty 09/21/20 1500 Site Signs asymptomatic with no redness, no swelling, no drainage 09/21/20 1500 Phlebitis 0-->no symptoms 09/21/20 1500 Infiltration/Extravasation Assessment 0-->no symptoms 09/21/20 1500 Patient Education Instructed to keep IV site dry;Instructed to call nurse if site is painful,red,swollen, burning;Instructed to call nurse if fluid leaking from site 09/21/20 1500   Periph IV-single(adult) 09/21/20 0759 cephalic (thumb side), right over-the-needle catheter system 16 gauge CRNA (Active) SiteCare/Dressing Status/Securement dressing dry and intact 09/21/20 1500 Lumen 1 Patency/Care Patent;flushed w/o difficulty 09/21/20 1500 Site Signs asymptomatic with no redness, no swelling, no drainage 09/21/20 1500 Phlebitis 0-->no symptoms 09/21/20 1500 Infiltration/Extravasation Assessment 0-->no symptoms 09/21/20 1500 Patient Education Instructed to keep IV site dry;Instructed to call nurse if site is painful,red,swollen, burning;Instructed to call nurse if fluid leaking from site 09/21/20 1500 IV Fluids: ? HYDROmorphone   ? lactated Ringers 75 mL/hr (09/21/20 1648) ? sodium chloride   Pain Assessment: Number Scale (0-10) 09/21/20 1445-Pain Rating (0-10): Rest: 9  Pain Assessment: Number Scale (0-10) 09/21/20 1445-Pharmaceutical Interventions: single medication modality Wound 09/21/20 1500 Left leg-Incision Closure : unable to assess  Body Position: supineHead of Bed (HOB) Positioning: HOB at 30 degrees   Time of Last Void or Time that Urinary Catheter was Removed Intra-Op: has not voided Additional Info: S/c at 1300 in OR.Contact RN (name and phone number): Berenice Primas SP PACU

## 2020-09-21 NOTE — Anesthesia Post-Procedure Evaluation
Anesthesia Post-op NotePatient: Phillip RiceProcedure(s):  Procedure(s) (LRB):Open reduction internal fixaton left femur nounion (Left) Patient location: PACULast Vitals:  I have noted the vital signs as listed in the nursing notes.Mental status recovered: patient participates in evaluation: Yes, sleepy but easily arousableVital signs reviewed: YesRespiratory function stable:YesAirway is patent: YesCardiovascular function and hydration status stable: YesPain control satisfactory: YesNausea and vomiting control satisfactory:YesNo complications documented.

## 2020-09-21 NOTE — Other
Post Anesthesia Transfer of Care NotePatient: Phillip RiceProcedure(s) Performed: Procedure(s) (LRB):Open reduction internal fixaton left femur nounion (Left) Patient location: PACU Last Vitals: Vitals Value Taken Time BP 157/98 09/21/20 1437 Temp 36.8 ?C 09/21/20 1437 Pulse 97 09/21/20 1437 Resp 16 09/21/20 1437 SpO2 100 % 09/21/20 1437 Level of consciousness: awake, alert  and orientedTransport Vital Signs:  Stable since the last set of recorded intra-operative vital signsIntra-operative Complications: noneIntra-operative Intake & Output and Antibiotics as per Anesthesia record and discussed with the RN.

## 2020-09-21 NOTE — Interval H&P Note
Subsequent to admission for surgery or invasive procedure, I have reassessed the patient by examination and review of relevant data pertaining to the planned procedure. I have verified the planned procedure and there are no relevant changes since the H&P.   Phillip Bell  LEFT femur nonunion repair today

## 2020-09-21 NOTE — Progress Notes
Pain medicine consulted for PCA utilization with CADD pump.  Based on the following clinical parameters, PCA is approved.1.	Patient on strict NPO-NO2.	Pain uncontrolled with administration of IV push/subcutaneous administration of opioids-YES3.	Patient is highly opioid tolerant- pain associated with primary diagnosis is expected to be severe NRS>8--YES4.	Preoperative patient has received a.	Regional anesthetic technique-yesb.	Acetaminophenc.	NSAIDSd.	Gabapentin/pregabaline.	Long acting opioid medication5.  Sickle cell disease patientPlease consult Adult sickle cell team for analgesic recommendations

## 2020-09-21 NOTE — Consults
[facility]Hemat[facility] eConsult NoteCONSULTED BY: Attending Provider: Luis Abed, MD [phone] OF SERVICE: 3/21/2022REASON FOR CONSULT: 25 yo M sickle cell s/p Left femur revision ORIF. Pain control concerns. PCA per outpat hematologist and Pain service. Pain team Recc call Dr. Lodema Hong who is unavailable and he recc placing consult to hematology for managementHistory of Present Illness:  HPI: 25 y.o. 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 He is currently s/p surgery today. Hematology was consulted for comanagement of sickle cell disease. Pt's VSS. HGB 7.6, which has been his baseline over the past few months. Receiving PCA pump for pain control.Per outpatient note:?Sickle cell disease (Hbxx)?	02/18/2019: On hydroxyurea 500 mg 3 tab daily. Reluctant to increase dose. Sent names of 3 new drugs, glutamine, voxelotor, crizanlizumab. ?	03/20/2019: Read about 2 of 3 meds. No interest at this time. ?	01/16/2020?	Hydroxyurea: Taking 3 tab daily.?	Glutamine: not interested?	Voxelotor: no indication?	Crizanlizumab: not interested?	05/18/2020:  Continues hydroxyurea 3 tabs daily. ?Transfusion Hx?	09/11/2019: [facility]fusion [facility] in [place]. ?Social situation?	Lives: parents in [place]. 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 Interests: hanging out, applying for jobs?	01/16/2020: Continues to live with parents. 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 Also considering moving to [employer] (college there).  ?	03/18/2020:  Starting a new job in 1 week. ?	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 Discharged on apixaban, apparently stopped it early. 03/18/2020:  In follow up with Ortho. 05/18/2020: Would like to return back to having physical therapy, he will communicate with Orthopedic.07/13/2020:  Has follow up with Ortho tomorrow. ?Intermittent pain?	Typical sickle cell pain?	Oxycodone 10 mg tab and oxycodone ERT 10 mg #60 uses irregularly but rationally for persistent pain crisis. Last dispensed 7/19 according to PMP. ?	02/18/2019: doing well on oxycodone IR 10 mg #60 and oxycodone ERT 10 mg #60 every month. ?	10/09/2019: Continue as above. No request for Rx's today.?	03/18/2020:  Continue as above. Will request prior authorization from PARS department.?	11/15/2021Marlowe Kays and oxycodone continue?Medical marijuana?	Discussed 07/17/17. Certified and accessed thereafter. ?	10/09/2019: Re-certified 3/21.?	01/16/2020: Trying to be clean.?Medical History: Past Medical History Past Surgical History Past Medical History: Diagnosis Date ? Aplastic crisis (HC Code) (HC CODE) 6/6/ 2005 transfusion ? Avascular necrosis of femur head, left (HC Code) (HC CODE)  ? Chronic pain   sickle cell ? Conductive hearing loss 09/22/14 P.E tubes placed ? Dactylitis one episode  April, 1998 ? GERD (gastroesophageal reflux disease)  ? 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 On hydroxyurea therapy started October 2013 ? Pneumococcal vaccination given 01/20/2006 and 09/28/2012 ? 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 CHOLECYSTECTOMY, LAPAROSCOPIC  1/15 ? TYMPANOSTOMY TUBE PLACEMENT  09/22/2014  by Dr. Bernita Raisin  Social History Family History Social History Tobacco Use ? Smoking status: Never Smoker ? Smokeless tobacco: Never Used Substance Use Topics ? Alcohol use: Yes   Comment: beer and liquor on occasion ? Drug use: Not  Currently   Types: Marijuana   Comment: last used on Tuesday Social History Substance and Sexual Activity Drug Use Not Currently ? Types: Marijuana  Comment: last used on Tuesday  Family History Problem Relation Age of Onset ? Sickle cell trait Mother  ? Thyroid disease Mother  ? Sickle cell trait Father  ? Sarcoidosis Father  ? Diabetes Paternal Grandfather  ? Hypertension Paternal Grandfather  ? Alcohol abuse Maternal Aunt   Outpatient Medications Prior to Admission Medication Medication Sig Dispense Refill ? hydroxyurea (HYDREA) 500 mg capsule 3 tab daily for blood. 90 capsule 2 ? ibuprofen (ADVIL,MOTRIN) 600 mg tablet Up to 4 tablets daily as needed for pain. 100 tablet 1 ? oxyCODONE (ROXICODONE) 15 mg Immediate Release tablet 1 - 3 tablets every 3 h as needed for pain. 60 tablet 0 ? oxyCODONE myristate (XTAMPZA ER) 13.5 mg 12 hr extended release sprinkle capsule Take 1 capsule (13.5 mg total) by mouth every 12 (twelve) hours. Long-acting medicine to control pain. 340b. D57.1. For prior authorization Fax [phone]. 28 capsule 0 ? cefuroxime (CEFTIN) 500 mg tablet Take 1 tablet (500 mg total) by mouth every 12 (twelve) hours. (Patient not taking: Reported on 06/22/2020) 5 tablet 0 ? doxycycline hyclate (VIBRA-TABS) 100 mg tablet Take 1 tablet (100 mg total) by mouth every 12 (twelve) hours. (Patient not taking: Reported on 06/22/2020) 4 tablet 0 ? Miscellaneous Medical Supply Heel lift Left shoeUse as directed. (Patient not taking: Reported on 09/08/2020) 1 each 0 ? naloxone (NARCAN) 4 mg/actuation spray Use 1 spray in 1 nostril for suspected opioid overdose. May repeat in 2 minutes in other nostril with new device if minimal or no response. 2 each 1 ? senna (SENOKOT) 8.6 mg tablet Take 2 tablets (17.2 mg total) by mouth every 3 (three) hours as needed for constipation. 30 tablet 0  Current Medications Scheduled Meds:Current Facility-Administered Medications Medication Dose Route Frequency Provider Last Rate Last Admin ? acetaminophen (TYLENOL) tablet 975 mg  975 mg Oral Q6H Adeclat, Shon Hale, MD     ? ceFAZolin (ANCEF) 1 g in sodium chloride 0.9 % 10 mL (100 mg/mL)  1 g IV Push Once Adeclat, Shon Hale, MD     ? Melene Muller ON 09/22/2020] enoxaparin (LOVENOX) syringe 40 mg  40 mg Subcutaneous Daily Adeclat, Shon Hale, MD     ? Melene Muller ON 09/22/2020] hydroxyurea (HYDREA) capsule 1,500 mg  1,500 mg Oral Daily Adeclat, Shon Hale, MD     ? Melene Muller ON 09/22/2020] polyethylene glycol (MIRALAX) packet 17 g  17 g Oral Daily Adeclat, Shon Hale, MD     ? senna-docusate (SENNA-PLUS) 8.6-50 mg per tablet 2 tablet  2 tablet Oral BID Adeclat, Shon Hale, MD     ? sodium chloride 0.9% infusion  75 mL/hr Intravenous Once Nadene Rubins, APRN     Continuous Infusions:? HYDROmorphone   ? lactated Ringers 75 mL/hr (09/21/20 1648) ? sodium chloride   PRN Meds:.bisacodyL, bisacodyL, diazePAM, ketorolac, naloxone, ondansetron, sodium chloride Allergies Allergies Allergen Reactions ? Nickel Rash  ROSAs above. Remainder of 14 point ROS reviewed and negative and non contributory. Vital signs and Exam: Vitals:Temp:  [97.5 ?F (36.4 ?C)-98.3 ?F (36.8 ?C)] 97.5 ?F (36.4 ?C)Pulse:  [67-97] 72Resp:  [13-17] 16BP: (113-157)/(57-98) 129/74SpO2:  [98 %-100 %] 99 % I/O's:Intake/Output Summary (Last 24 hours) at 09/21/2020 1742Last data filed at 09/21/2020 1419Gross per 24 hour Intake 2230 ml Output 1050 ml Net 1180 ml Physical Exam:Not performed for this econsult encounter.Objective Data: MetabolicRecent Labs Lab 03/21/220635 K  4.2  HematologicRecent Labs Lab 03/18/221421 03/21/220635 WBC 10.8  --  HGB 8.9*  --  HCT 25.10* 31.0* PLT 476*  --  NEUTROPHILS 54.5  --  ANCANC 5.86  --   HepaticNo results for input(s): BILITOT, BILIDIR, ALT, AST, ALKPHOS, GGT, PTT, INR, PROT, ALBUMIN in the last 168 hours. SpecialNo results for input(s): URICACID, LDH, FIBRINOGEN, DDIMER, FERRITIN, CRP in the last 168 hours.Invalid input(s): ESR Imaging: No results found.Pathology/Molecular Diagnostics:N/AAssessment and Plan: 25 y.o. 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 He is currently s/p surgery today. Hematology was consulted for comanagement of sickle cell disease. Pt's VSS. HGB 7.6, which has been his baseline over the past few months. Receiving PCA pump for pain control.Recommendations-no indication for pRBC transfusion for now given pt is not in sickle cell pain crisis-agree with PCA pump for pain management in the acute setting, wean as toleratedCase was discussed with Dr. Josephine Igo.Jinny Sanders, MD, PhDHematology/Medical oncology fellow, PGY5Post-op recommendations from Dr. Beckey Rutter spirometer every 2 hours -PCA hydromorphone 0.5 mg q 10 minutes with 1 hour lockout 3 mg. ? - Ketorolac 15 mg IV PRN -Bowel regimen to prevent narcotic induced constipation -Continue hydroxyurea 1500 mg daily

## 2020-09-21 NOTE — Utilization Review (ED)
UM Status: Commercial - IP

## 2020-09-21 NOTE — Other
Orthopedic Post-Op Check: Procedure: [facility] left femur IM Nail. Revision ORIF plate fixation Left femoral shaft Nonunion Surgeon: Dr. Artist Pais Anesthesia: GeneralSubjective: The patient is a 25 y.o. male.  He is feeling well and reports that pain is controlled with pain medication.  He denies chest pain or shortness of breath.  He also denies nausea, vomiting, or abdominal pain.Vitals:BP Readings from Last 1 Encounters: 09/21/20 (!) 143/71  Pulse Readings from Last 1 Encounters: 09/21/20 83  Resp Readings from Last 1 Encounters: 09/21/20 (!) 13  Temp Readings from Last 1 Encounters: 09/21/20 98.3 ?F (36.8 ?C) (Temporal)  SpO2 Readings from Last 1 Encounters: 09/21/20 100% Physical Exam:General: Alert and comfortable, NADLeft ZOX:WRUEAVWU is clean, dry and intact. Aquacel in place.Distal sensation is grossly intact to light touchCalves are soft and non-tenderPalpable 2+ DP/PT pulsesToes are warm and well-perfused, capillary refill <2 seconds Intact tibialis anterior, extensor hallucis longus, flexor hallucis longus, and gastrocnemius Labs:Lab Results Component Value Date  WBC 10.8 09/18/2020  HGB 8.9 (L) 09/18/2020  HCT 31.0 (L) 09/21/2020  MCV 95.8 09/18/2020  PLT 476 (H) 09/18/2020 Lab Results Component Value Date  CREATININE 0.80 09/14/2020  BUN 10 09/14/2020  NA 141 09/14/2020  K 4.2 09/21/2020  CL 106 09/14/2020  CO2 24 09/14/2020 Lab Results Component Value Date  INR 1.10 (H) 09/14/2020  INR 1.13 12/20/2019  INR 1.11 02/04/2019 Estimated Creatinine Clearance: 136 mL/min (by C-G formula based on SCr of 0.8 mg/dL).Diagnostics:XR Femur Left AP and LateralResult Date: 3/8/2022XR FEMUR LEFT AP AND LATERAL Clinical Indication: f/u Comparison: July 14, 2020. Findings: 2 AP and 2 lateral views. There is intramedullary rod and screw fixation for fracture of the mid femoral shaft. As on the prior examination, there is a fracture of the proximal interlocking screw. There is now also fracture of the distal most interlocking screw. The osseous fracture plane remains readily apparent. 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 Impression: Surgical fixation for fracture of the left femur with fracture of 2 of the interlocking screws. Reported And Signed By: Juliann Pulse, MD  [facility] Radiology and Biomedical Imaging NR [place] More Than 1 Hour ExamResult Date: 3/21/2022DISCLAIMER  This procedure captures images only.  There is no report.US Guidance for Anesthesia BlockResult Date: 3/21/2022DISCLAIMER  This procedure captures images only.  There is no report.Assesment: POD #0 s/p ROH left femur IM Nail. Revision ORIF plate fixation Left femoral shaft Nonunion Per imagine there is HW in place without implant shifting, loosening or breakage, no periprosthetic fractures observed Body mass index is 19.26 kg/m?Marland KitchenPlan: - IV Ancef  peri operative.- TDWB to LLE with PT with crutches- F/u Pain recs- F/u Hematology- VTE Prophylaxis: Lovenox 40 mg SQ QD, mobilization, SCD's- Continue home medications- Pain control (PO with IV breakthrough)- Diet as tolerated- Bowel regimenElectronically Signed by Fabio Pierce, PA, March 21, 2022Pager [phone]

## 2020-09-21 NOTE — Other
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

## 2020-09-21 NOTE — Anesthesia Procedure Notes
Room and Bed: [facility] PERIOP SP , Pool  Current Location: [facility] SPOR Lower Extremity Block:  Femoral and Lat fem cutaneousPain Diagnosis/Location: Left LegThis block procedure is being performed for post-operative pain management at the request of:      Requesting Physician: YooPre-procedure Checklistpatient identified; site marked; surgical consent reviewd; pre-op evaluation performed; Universal Protocol/timeout performed; IV checked; monitors and equipment checked; informed consent obtained and options, plan, risks & benefits discussed with patientUniversal ProtocolTiming: the time out is initiated after the patient is positioned and prior to the beginning of the procedure: YesName of patient and medical record number or DOB (if MRN is unavailable) stated and checked with ID band or        previously confirmed MEDICAL RECORD NUMBERYesProceduralist states or confirms the procedure to be performed: YesThe procedural consent is used to verify the procedure to be performed: YesSite of procedure(s) (with laterally or level) is topically marked per policy and visible after draping: YesRadiographic imaging is present or a laterality side/site band is present on patient and is accessible: N/AMedications addressed: YesBlood Addressed: YesImaging addressed: N/AImplants addressed: N/ASpecial equipment or other needs addressed: N/AProceduralist discusses particular challenges or special considerations: N/ABaseline Neurological Deficits:NonePre-op Anti-coagulation ZOX:[id number]'X pre-procedure mental status:  Sedate but cooperativePrep: chloraprepNeedle A:  FemoralLaterality: leftInjection technique: single-shotNeedle: Short-bevel and Insulated      Gauge: 20 G      Length: 4 inTechnique: Ultrasound guidedEvent(s): blood not aspirated, injection not painful, no injection resistance and no paresthesiaPerformed By:      - Anesthesiologist: Bellarae Lizer, with fellow      - Other anesthesia staff: MillerPatient's position for procedure:supineNeedle B: Lat fem cutaneousLaterality: leftInjection technique: single-shotNeedle: Short-bevel and Insulated      Gauge: 20 G      Length: 4 inTechnique: Ultrasound guidedEvent(s): no blood aspirated, no injection painful, no injection resistance, no paresthesia and no other eventPerformed by:      - Anesthesiologist: Gary Bultman, with fellow      - Other anesthesia staff: MillerNew laterality for needle/catheter 2: yesNew location for needle/catheter 2: yesNew patient's position for procedure:YesPatient's position for procedure:supineOutcome:  successful blockAttempts:  block completePatient tolerated block procedure well?:  yesAdditional Notes:Regional Anesthesiology/Block Service Consult:Procedure name: Procedure(s) (LRB):Open reduction internal fixaton left femur nounion (Left)HPI: 25 y.o. y male presenting for above procedureRegional team consulted for PNB for post op analgesia.BJY:NWGN Medical History:6/6/ 2005 transfusion: Aplastic crisis (HC Code) (HC CODE)No date: Avascular necrosis of femur head, left (HC Code) (HC CODE)No date: Chronic pain    Comment:  sickle cell3/21/16 P.E tubes placed: Conductive hearing lossone episode  April, 1998: DactylitisNo date: GERD (gastroesophageal reflux disease)08/06/2010 Hb Electrophoresis: Hb S = 88.2%, HbF= 3.9%, Hb A2= 3.7%: Hemoglobin S-S disease (HC Code) (HC CODE)    Comment:  FORM OF SICKLE CELL DISEASEstarted October 2013: On hydroxyurea therapy7/20/2007 and 09/28/2012: Pneumococcal vaccination given3/07/1997: Spleen sequestrationAdm 01/02/2012, ER 04/08/12, 4/1-10/03/12, 7/2-01/05/13, 12/02/13-12/03/13 , March 2016 - 2 admissions: Vasoocclusive sickle cell crisis (HC Code) (HC CODE)Prior to Admission Medications:(Not in a hospital admission)Allergies: -- Nickel -- RashPhysical exam:Gen: no acute distressCardiac: regular rate and rhythmPulmonary: no respiratory distress, breathing normallyAbdominal: non-distendedMusculoskeletal: Normal range of motion, pain with movement Neuro: intact sensation, no motor deficitsAirway ExamI evaluated the patient's airway prior to procedure. Mallampati II (hard and soft palate, upper portion of tonsils anduvula visible)Vital Signs: WNLPatient consented to documented procedure. Risks (including but not limited to bleeding, infection, damage to nearby structures, nerve injury, persistent weakness, inadequate analgesia and local anesthetic toxicity), benefits and alternatives of block discussed with  patient.  All questions answered to patient's satisfaction.Signed: Mollie Germany, MD For questions please call:  [facility] Block Service  - [phone]/21/20228:09 AMAttending Statement:Kazuma Prieur is a 25 y.o. male scheduled for L femur ORIF. The block service was consulted for placement of a regional nerve block to assist with postoperative pain control. Discussed risks, benefits, and alternatives of L femoral nerve and lateral femoral cutaneous nerve block for postoperative pain management. The patient understands and agrees to proceed with placement of the nerve block under ultrasound guidance. Informed consent obtained. I have seen and evaluated the patient with the regional anesthesiology service and agree with the note above. Yvone Neu, MD
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
## 2020-09-21 NOTE — Anesthesia Pre-Procedure Evaluation
This is a 25 y.o. male scheduled for Open reduction internal fixaton left femur nounion (Left ).Review of Systems/ Medical HistoryPatient summary, EKG/Cardiac Studies , Labs, pre-procedure vitals, height, weight and NPO status reviewed.No previous anesthesia concernsAnesthesia Evaluation: Estimated body mass index is 19.26 kg/m? as calculated from the following:  Height as of this encounter: 6' 2 (1.88 m).  Weight as of this encounter: 68 kg. 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  He has a sickle cell anemic patient.  He does have occasionally sickle cell crises as well.Atrophic nonunion left femurPlan repair left femur nonunion, osteo periosteal flap with plate fixation and compression.  Nail extractionCardiovascular: Patient has no history of hypertension.  No angina.  No dyspnea. -Exercise tolerance: >4 METS Respiratory:  He has no pneumonia (Hx acute chest syndrome ).Neuromuscular: NegativeSkeletal/Skin:  -Joint and Skeletal Disorders: bone fracture (Avascular necrosis of left femoral head )Gastrointestinal/Genitourinary: -Gastrointestinal Disorders:  Patient has GERD.Hematological/Lymphatic: -Anemia: Patient has anemia (Has recieved 3U PBCs in the paST WEEK).   He has sickle cell disease. Sickle cell crisis type: vascular.-Coagulopathy:  Patient has blood dyscrasia.Physical ExamCardiovascular:    Rhythm: regularPulmonary:  normal exam  Airway:  Mallampati: ITM distance: >3 FBNeck ROM: fullDental:  normal exam  Dentition: otherOther Findings: Blood pressure 113/67, pulse 78, temperature 36.8 ?C, temperature source Temporal, resp. rate 14, height 6' 2 (1.88 m), weight 68 kg, SpO2 100 %.Anesthesia PlanASA 3 The primary anesthesia plan is  general ETT. Keep well hydrated and warmPerioperative Code Status confirmed: It is my understanding that the patient is currently designated as 'Full Code' and will remain so throughout the perioperative period.Anesthesia informed consent obtained. Consent obtained from: patientUse of blood products: consented    Consent Comment: Anesthetic plan with associated risks, benefits, and alternatives discussed. Related questions answered and concerns addressed. Verbal consent for anesthesia obtained.The post operative pain plan is IV analgesics and perineural.Plan discussed with CRNA.Anesthesiologist's Pre Op NoteI personally evaluated and examined the patient prior to the intra-operative phase of care.

## 2020-09-21 NOTE — Other
-  CONSULT  REQUEST  DOCUMENTATION-CONNECT CENTER NOTE-Type of consult: [facility] Pain Management -New Consult: [id number] Phillip Bell /Location: [facility] PERIOP SP/Pool / *Brief Clinical Question: 25 yo M sx sickle cell anemia s/p Revision ORIF of left femur. Hematology gave reccs as outpatient to start on Hydromorphone PCA. Recc was for Pain consult/Callback Cell Phone: [phone]** / Please confirm receipt of this message by texting back ?OK?-2 Glena Norfolk Page sent to Pain Management [facility] [phone] at 2:49 PM.-Ronny Korff Hudd3/21/20222:49 [facility] [phone]

## 2020-09-21 NOTE — Other
-  CONSULT  REQUEST  DOCUMENTATION-CONNECT CENTER NOTE-Type of consult: [facility] Hematology -New Consult: [id number] [name] Mailloux /Location: [facility] PERIOP SP/Pool / Brief Clinical Question: 25 yo M sickle cell s/p Left femur revision ORIF. Pain control concerns. PCA per outpat hematologist and Pain service. Pain team Recc call Dr. Lodema Hong who is unavailable and he recc placing consult to hematology for management/Callback Cell Phone: [phone] / Please confirm receipt of this message by texting back ?OK?-1 - Mobile Heartbeat message sent to [place]ed, A at 3:22 PM. Received response at 15:27.-Nahomi Hegner Justice Deeds, PCT3/21/20223:22 [employer] [phone]

## 2020-09-22 ENCOUNTER — Encounter
Admit: 2020-09-22 | Payer: PRIVATE HEALTH INSURANCE | Attending: Anesthesiology | Primary: Student in an Organized Health Care Education/Training Program

## 2020-09-22 MED ORDER — FLU VACCINE QS 2021-22(6MOS UP)(PF) 60 MCG(15 MCGX4)/0.5 ML IM SYRINGE
60 mcg (15 mcg x 4)/0.5 mL | INTRAMUSCULAR | Status: CP
Start: 2020-09-22 — End: ?

## 2020-09-22 NOTE — Plan of Care
Plan of Care Overview/ Patient Status    Assumed care of pt at 0700. Pt AOx4. VSS on RA. Ace wrap and aquacel to LLE. +CMS to LLE. Pain controlled with Dilaudid PCA. Tolerating PO diet. DTV. Ambulating in hall with PT this shift.  Safety maintained. Plan for PT/OT. Pt continues to be non compliant with TDWB to LLE, ambulating independently in room despite education on importance of supervision. See flowsheet for more details.

## 2020-09-22 NOTE — Plan of Care
Case Management Screening    Most Recent Value Case Management Screening: Chart review completed. If YES to any question below then proceed to CM Eval/Plan Is there a change in their cognitive function No Is there a change in their base line physical function. Yes Has there been a readmission within the last 30 days No Were there services prior to admission ( Examples: Assisted Living, HD, Homecare, Extended Care Facility, Methadone, SNF, Outpatient Infusion Center) No Negative/Positive Screen Positive Screening: Complete CM Evaluation and Plan Case Manager Attestation: Choose which ONE is appropriate for you I have reviewed the medical record and completed the above screen. CM staff will follow patient's progress and discuss the plan of care with the Treatment Team. Yes  Case Management Evaluation    Most Recent Value Case Management Evaluation and Plan Arrived from prior to admission home/apartment/condo  Lives With Mother Services Prior to Admission none Patient Requires Care Coordination Intervention Due To change in physical function Documented Insurance Accurate Yes  [BCBS] Patient's PCP of record verified Yes Last Date Seen by PCP 0-3 months Living Environment  Lives With Mother Source of Clinical History Patient's clinical history has been reviewed and source of Information is: Medical record Case Manager Attestation: Choose which ONE is appropriate for you I have reviewed the medical record and completed the above evaluation with the following recommendations. Yes Discharge Planning Coordination Recommendations Discharge Planning Coordination Recommendations Needs not determined at this time  Problem: Adult Inpatient Plan of CareGoal: Readiness for Transition of CareOutcome: Interventions implemented as appropriate Plan of Care Overview/ Patient Status    POD#1 S/p ROH left femur IM Nail. Revision ORIF plate fixation Left femoral shaft Nonunion Lives: parents in [place], with stairs present a home. Owns crutches from Previous admission. No Hx of HC or STRChart reviewed. Treatment plan ongoing. Needs not determined at this time. CC will continue to follow pt's progress and discuss D/C plan with Multidisciplinary team Conley Canal RN, BSN. Care Manager 7-7Dept Care managementPhone [phone]

## 2020-09-22 NOTE — Other
PHARMACY-ASSISTED MEDICATION REPORTPharmacist review of the best possible medication history obtained by the pharmacy medication history technician has been performed.  I have updated the home medication list and identified the following information that may be relevant to this admission.NOTES/RECOMMENDATIONSNo recommendations at this time        Prior to Admission Medications Medication Name Sig Taking? Patient Reported   cefuroxime (CEFTIN) 500 mg tabletLast dose: Not Taking at Unknown timeLast Medication Note: >> Cena Benton   Tue Sep 22, 2020  1:40 PMPharmD (SS): FLAG FOR REMOVAL - therapy completedEntered by Cena Benton, PharmD Tue Sep 22, 2020 1340 Take 1 tablet (500 mg total) by mouth every 12 (twelve) hours.Patient not taking: Reported on 06/22/2020       doxycycline hyclate (VIBRA-TABS) 100 mg tabletLast dose: Not Taking at Unknown timeLast Medication Note: >> Cena Benton   Tue Sep 22, 2020  1:40 PMPharmD (SS): FLAG FOR REMOVAL - therapy completedEntered by Cena Benton, PharmD Tue Sep 22, 2020 1340 Take 1 tablet (100 mg total) by mouth every 12 (twelve) hours.Patient not taking: Reported on 06/22/2020       hydroxyurea (HYDREA) 500 mg capsuleLast dose: 09/20/2020 at Unknown time 3 tab daily for blood. Yes     ibuprofen (ADVIL,MOTRIN) 600 mg tabletLast dose: Past Week at Unknown timeLast Medication Note: >> Marissa Calamity Sep 09, 2020 10:15 AMMedHX Tech(Leticia Cleveland, LPN): patient reports will HOLD x1 week before surgery. Entered by Bernie Covey, LPN Wed Sep 09, 2020 1015 Up to 4 tablets daily as needed for pain. Yes     Discontinued: 09/22/2020  1:41 PMLast dose:  --        naloxone (NARCAN) 4 mg/actuation sprayLast dose:  -- Last Medication Note: >> Marissa Calamity Sep 09, 2020 10:15 AMMedHX Tech(Leticia Cleveland, LPN):  patient reports has on hand if needed.  Entered by Bernie Covey, LPN Wed Sep 09, 2020 1015 Use 1 spray in 1 nostril for suspected opioid overdose. May repeat in 2 minutes in other nostril with new device if minimal or no response.       oxyCODONE (ROXICODONE) 15 mg Immediate Release tabletLast dose: 09/20/2020 at Unknown time 1 - 3 tablets every 3 h as needed for pain. Yes     oxyCODONE myristate (XTAMPZA ER) 13.5 mg 12 hr extended release sprinkle capsuleLast dose: 09/20/2020 at Unknown time Take 1 capsule (13.5 mg total) by mouth every 12 (twelve) hours. Long-acting medicine to control pain. 340b. D57.1. For prior authorization Fax [phone]. Yes     senna (SENOKOT) 8.6 mg tabletLast dose: Not Taking at Unknown timeLast Medication Note: >> Melonie Florida Jun 15, 2020 11:53 PMMedHx Tech(Coretta Hines-Campbell, CPHT): FLAG FOR REMOVAL -  Confirmed not taking with the patientEntered by Riccardo Dubin, CPHT Mon Jun 15, 2020 2353 Take 2 tablets (17.2 mg total) by mouth every 3 (three) hours as needed for constipation.       Prior to admission medications last reviewed by Cena Benton, PharmD on Tue Sep 22, 2020 1341 Thank Janett Labella, PharmD3/22/20221:42 PMPhone: MHB
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
## 2020-09-22 NOTE — Progress Notes
Regional Anesthesiology/Block Service Progress NoteMitchel Bell is a 25 y.o. male who is POD 1 s/p ROH and revision ORIF L femur nonunion s/p femoral and lateral femoral cutaneous nerve blocks for post-operative pain management. The patient is doing well and pain is well controlled. Pain currently controlled with Dilaudid PCA pump and oral Tylenol. Pt states he has deep, burning pain along incision site and posterior aspect of L thigh. Denies numbness, paresthesia to L leg. Denies pain below L knee.   Patient can dorsiflex and plantar flex. The patient has not been up to ambulate post-op.The pain is being managed by primary service. Ongoing pain management will be by the primary service. Regional anesthesiology service will sign off at this time. Please call with questions.Thank you for involving our team in the care of this patient.Signed, Sharmon Revere DNP, APRN, FNP-BCRegional Anesthesia Block TeamCell [phone]

## 2020-09-22 NOTE — Progress Notes
Orthopaedic Progress NoteDOS: 09/21/20 - ROH and revision ORIF left femur nonunionSubjective:Comfortable with Dilaudid PCAPhysical Exam:Left ZOX:WRUEAVWUJ are clean, dry and intact. Aquacel in place.Distal sensation is grossly intact to light touchCalves are soft and non-tenderPalpable 2+ DP/PT pulsesToes are warm and well-perfused, capillary refill <2 seconds Intact tibialis anterior, extensor hallucis longus, flexor hallucis longus, and gastrocnemius Temp:  [97.5 ?F (36.4 ?C)-98.5 ?F (36.9 ?C)] 98.4 ?F (36.9 ?C)Pulse:  [64-97] 72Resp:  [14-18] 15BP: (115-157)/(57-98) 116/67SpO2:  [99 %-100 %] 100 %Lab Results Component Value Date  WBC 13.7 (H) 09/21/2020  HGB 7.6 (L) 09/21/2020  HCT 22.90 (L) 09/21/2020 Lab Results Component Value Date  CREATININE 0.76 09/21/2020 Current Facility-Administered Medications Medication ? acetaminophen (TYLENOL) tablet 975 mg ? bisacodyL (DULCOLAX) EC tablet 5 mg ? bisacodyL (DULCOLAX) suppository 10 mg ? diazePAM (VALIUM) tablet 2 mg ? enoxaparin (LOVENOX) syringe 40 mg ? HYDROmorphone PF (DILAUDID) 1 mg/mL in sodium chloride 0.9 % 50 mL PCA ? hydroxyurea (HYDREA) capsule 1,500 mg ? ketorolac (TORadol) injection 15 mg ? lactated Ringers infusion ? naloxone (NARCAN) injection 0.04 mg ? ondansetron (PF) (ZOFRAN) injection 4 mg ? ondansetron (PF) (ZOFRAN) injection 4 mg ? ondansetron (ZOFRAN-ODT) disintegrating tablet 4 mg ? polyethylene glycol (MIRALAX) packet 17 g ? senna-docusate (SENNA-PLUS) 8.6-50 mg per tablet 2 tablet ? sodium chloride 0.9 % flush 3 mL Assessment and Plan:Phong Seddon is a 25 y.o. M hx sickle cell anemia s/p ROH and revision ORIF left femur on 09/21/20-TDWB LLE w/ crutches-PT eval -Appreciate hematology reccs-Appreciate Pain service reccs-Regular diet-Dispo pending PT evalContact information:Ortho Trauma TeamWeekdays 24 hour: [phone]: Check amion for Orthopedic resident on callResident/Fellow Team: Rosana Hoes, Apryll Hinkle, Karie Georges, Alim RamjiFuture Appointments Date Time Provider Department Center 10/06/2020  9:00 AM Luis Abed, MD ORT [facility]

## 2020-09-22 NOTE — Plan of Care
Plan of Care Overview/ Patient Status    Patient appeared to have slept comfortably throughout the night with eyes closed. Able to make needs known. Pain management continues. Dressing at LLE surgical incision CDI. Denied respiratory distress. Call light within reach.
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
## 2020-09-23 ENCOUNTER — Encounter
Admit: 2020-09-23 | Payer: PRIVATE HEALTH INSURANCE | Attending: Internal Medicine | Primary: Student in an Organized Health Care Education/Training Program

## 2020-09-23 DIAGNOSIS — M87052 Idiopathic aseptic necrosis of left femur: Secondary | ICD-10-CM

## 2020-09-23 DIAGNOSIS — D6189 Other specified aplastic anemias and other bone marrow failure syndromes: Secondary | ICD-10-CM

## 2020-09-23 DIAGNOSIS — H902 Conductive hearing loss, unspecified: Secondary | ICD-10-CM

## 2020-09-23 DIAGNOSIS — IMO0002 Dactylitis: Secondary | ICD-10-CM

## 2020-09-23 DIAGNOSIS — D7389 Other diseases of spleen: Secondary | ICD-10-CM

## 2020-09-23 DIAGNOSIS — D5700 Hb-SS disease with crisis, unspecified: Secondary | ICD-10-CM

## 2020-09-23 DIAGNOSIS — G8929 Other chronic pain: Secondary | ICD-10-CM

## 2020-09-23 DIAGNOSIS — D571 Sickle-cell disease without crisis: Secondary | ICD-10-CM

## 2020-09-23 DIAGNOSIS — K219 Gastro-esophageal reflux disease without esophagitis: Secondary | ICD-10-CM

## 2020-09-23 DIAGNOSIS — Z7964 On hydroxyurea therapy: Secondary | ICD-10-CM

## 2020-09-23 DIAGNOSIS — Z23 Encounter for immunization: Secondary | ICD-10-CM

## 2020-09-23 LAB — CBC WITH AUTO DIFFERENTIAL
BKR WAM ABSOLUTE IMMATURE GRANULOCYTES.: 0.05 x 1000/ÂµL (ref 0.00–0.30)
BKR WAM ABSOLUTE LYMPHOCYTE COUNT.: 2.52 x 1000/ÂµL (ref 0.60–3.70)
BKR WAM ABSOLUTE NRBC (2 DEC): 0 x 1000/ÂµL (ref 0.00–1.00)
BKR WAM ANALYZER ANC: 6.02 x 1000/ÂµL (ref 2.00–7.60)
BKR WAM BASOPHIL ABSOLUTE COUNT.: 0.05 x 1000/ÂµL (ref 0.00–1.00)
BKR WAM BASOPHILS: 0.5 % (ref 0.0–1.4)
BKR WAM EOSINOPHIL ABSOLUTE COUNT.: 0.14 x 1000/ÂµL (ref 0.00–1.00)
BKR WAM EOSINOPHILS: 1.4 % (ref 0.0–5.0)
BKR WAM HEMATOCRIT (2 DEC): 16.8 % — ABNORMAL LOW (ref 38.50–50.00)
BKR WAM HEMOGLOBIN: 5.9 g/dL — CL (ref 13.2–17.1)
BKR WAM IMMATURE GRANULOCYTES: 0.5 % (ref 0.0–1.0)
BKR WAM LYMPHOCYTES: 24.7 % (ref 17.0–50.0)
BKR WAM MCH (PG): 33.1 pg — ABNORMAL HIGH (ref 27.0–33.0)
BKR WAM MCHC: 35.1 g/dL (ref 31.0–36.0)
BKR WAM MCV: 94.4 fL (ref 80.0–100.0)
BKR WAM MONOCYTE ABSOLUTE COUNT.: 1.44 x 1000/ÂµL — ABNORMAL HIGH (ref 0.00–1.00)
BKR WAM MONOCYTES: 14.1 % — ABNORMAL HIGH (ref 4.0–12.0)
BKR WAM MPV: 9.5 fL (ref 8.0–12.0)
BKR WAM NEUTROPHILS: 58.8 % (ref 39.0–72.0)
BKR WAM NUCLEATED RED BLOOD CELLS: 0.4 % (ref 0.0–1.0)
BKR WAM PLATELETS: 516 x1000/ÂµL — ABNORMAL HIGH (ref 150–420)
BKR WAM RDW-CV: 18.4 % — ABNORMAL HIGH (ref 11.0–15.0)
BKR WAM RED BLOOD CELL COUNT.: 1.78 M/ÂµL — ABNORMAL LOW (ref 4.00–6.00)
BKR WAM WHITE BLOOD CELL COUNT: 10.2 x1000/ÂµL (ref 4.0–11.0)

## 2020-09-23 LAB — BASIC METABOLIC PANEL
BKR ANION GAP: 7 (ref 7–17)
BKR BLOOD UREA NITROGEN: 11 mg/dL (ref 6–20)
BKR BUN / CREAT RATIO: 17.7 (ref 8.0–23.0)
BKR CALCIUM: 8.7 mg/dL — ABNORMAL LOW (ref 8.8–10.2)
BKR CHLORIDE: 102 mmol/L (ref 98–107)
BKR CO2: 28 mmol/L (ref 20–30)
BKR CREATININE: 0.62 mg/dL (ref 0.40–1.30)
BKR EGFR (AFR AMER): >60 mL/min/{1.73_m2} (ref >60)
BKR EGFR (NON AFRICAN AMERICAN): >60 mL/min/{1.73_m2} (ref >60)
BKR GLUCOSE: 119 mg/dL — ABNORMAL HIGH (ref 70–100)
BKR POTASSIUM: 4 mmol/L (ref 3.3–5.3)
BKR SODIUM: 137 mmol/L (ref 136–144)

## 2020-09-23 MED ORDER — OXYCODONE IMMEDIATE RELEASE 15 MG TABLET
15 mg | ORAL | Status: DC | PRN
Start: 2020-09-23 — End: 2020-09-24
  Administered 2020-09-24 (×4): 15 mg via ORAL

## 2020-09-23 MED ORDER — OXYCODONE IMMEDIATE RELEASE 15 MG TABLET
15 mg | ORAL | Status: DC | PRN
Start: 2020-09-23 — End: 2020-09-24
  Administered 2020-09-23 – 2020-09-24 (×5): 15 mg via ORAL

## 2020-09-23 NOTE — Plan of Care
Plan of Care Overview/ Patient Status    Assumed care of pt at 0700. AOx4. VSS on RA. Ace wrap and aquacel to LLE. +CMS to LLE. Pain controlled with IV Toradol, Oxycodone and scheduled Tylenol. PCA pump d/c'd this AM. Tolerating PO diet. Ambulating in hall independently this shift with crutches.  Safety maintained. Pt Hgb: 5.9 Hct: 16.8 PA Lorie Apley made aware. No further interventions at this time per Hematology and PA Lorie Apley. Safety maintained. Call bell within reach.

## 2020-09-23 NOTE — Plan of Care
Plan of Care Overview/ Patient Status    Patient appeared to slept comfortably throughout the night with eyes closed. Denied respiratory distress. Surgical dressing at left thigh / LLE CDI. Able to make needs known. Pain management therapeutic. PCA pain pump infusing as ordered. Ambulates with crutches. Call light within reach.

## 2020-09-23 NOTE — Progress Notes
See evaluation by Konrad Penta 3/19

## 2020-09-23 NOTE — Progress Notes
Orthopaedic Progress NoteDOS: 09/21/20 - ROH and revision ORIF left femur nonunionSubjective:Comfortable with Dilaudid PCAAmbulating with crutches. Working with PTPhysical Exam:Left ZOX:WRUEAVWUJ are clean, dry and intact. Aquacel in place.Distal sensation is grossly intact to light touchCalves are soft and non-tenderPalpable 2+ DP/PT pulsesToes are warm and well-perfused, capillary refill <2 seconds Intact tibialis anterior, extensor hallucis longus, flexor hallucis longus, and gastrocnemius Temp:  [98 ?F (36.7 ?C)-98.3 ?F (36.8 ?C)] 98.1 ?F (36.7 ?C)Pulse:  [78-103] 78Resp:  [16-20] 20BP: (118-149)/(66-93) 118/69SpO2:  [98 %-100 %] 99 %Lab Results Component Value Date  WBC 10.2 09/23/2020  HGB 5.9 (LL) 09/23/2020  HCT 16.80 (L) 09/23/2020 Lab Results Component Value Date  CREATININE 0.62 09/23/2020 Current Facility-Administered Medications Medication ? acetaminophen (TYLENOL) tablet 975 mg ? bisacodyL (DULCOLAX) EC tablet 5 mg ? bisacodyL (DULCOLAX) suppository 10 mg ? diazePAM (VALIUM) tablet 2 mg ? enoxaparin (LOVENOX) syringe 40 mg ? HYDROmorphone PF (DILAUDID) 1 mg/mL in sodium chloride 0.9 % 50 mL PCA ? hydroxyurea (HYDREA) capsule 1,500 mg ? influenza vaccine (PF) 2021-2022 injection 0.5 mL ? ketorolac (TORadol) injection 15 mg ? lactated Ringers infusion ? naloxone (NARCAN) injection 0.04 mg ? ondansetron (PF) (ZOFRAN) injection 4 mg ? ondansetron (PF) (ZOFRAN) injection 4 mg ? ondansetron (ZOFRAN-ODT) disintegrating tablet 4 mg ? polyethylene glycol (MIRALAX) packet 17 g ? senna-docusate (SENNA-PLUS) 8.6-50 mg per tablet 2 tablet ? sodium chloride 0.9 % flush 3 mL Assessment and Plan:Phillip Bell is a 25 y.o. M hx sickle cell anemia s/p ROH and revision ORIF left femur on 09/21/20-TDWB LLE w/ crutches-PT recc home-Appreciate hematology reccs-Appreciate Pain service reccs-Regular diet-Dispo: cleared for home per PT. Contact information:Ortho Trauma TeamWeekdays 24 hour: [phone]: Check amion for Orthopedic resident on callResident/Fellow Team: Rosana Hoes, Dhiren Azimi, Karie Georges, Alim RamjiFuture Appointments Date Time Provider Department Center 10/06/2020  9:00 AM Luis Abed, MD ORT [facility]

## 2020-09-24 DIAGNOSIS — K219 Gastro-esophageal reflux disease without esophagitis: Secondary | ICD-10-CM

## 2020-09-24 DIAGNOSIS — D571 Sickle-cell disease without crisis: Secondary | ICD-10-CM

## 2020-09-24 DIAGNOSIS — T84098A Other mechanical complication of other internal joint prosthesis, initial encounter: Secondary | ICD-10-CM

## 2020-09-24 DIAGNOSIS — S72352K Displaced comminuted fracture of shaft of left femur, subsequent encounter for closed fracture with nonunion: Secondary | ICD-10-CM

## 2020-09-24 DIAGNOSIS — D62 Acute posthemorrhagic anemia: Secondary | ICD-10-CM

## 2020-09-24 LAB — CBC WITH AUTO DIFFERENTIAL
BKR WAM ABSOLUTE IMMATURE GRANULOCYTES.: 0.07 x 1000/ÂµL (ref 0.00–0.30)
BKR WAM ABSOLUTE LYMPHOCYTE COUNT.: 3.13 x 1000/ÂµL (ref 0.60–3.70)
BKR WAM ABSOLUTE NRBC (2 DEC): 0.1 x 1000/ÂµL (ref 0.00–1.00)
BKR WAM ANALYZER ANC: 6.58 x 1000/ÂµL (ref 2.00–7.60)
BKR WAM BASOPHIL ABSOLUTE COUNT.: 0.05 x 1000/ÂµL (ref 0.00–1.00)
BKR WAM BASOPHILS: 0.4 % (ref 0.0–1.4)
BKR WAM EOSINOPHIL ABSOLUTE COUNT.: 0.2 x 1000/ÂµL (ref 0.00–1.00)
BKR WAM EOSINOPHILS: 1.7 % (ref 0.0–5.0)
BKR WAM HEMATOCRIT (2 DEC): 16.5 % — ABNORMAL LOW (ref 38.50–50.00)
BKR WAM HEMOGLOBIN: 5.8 g/dL — CL (ref 13.2–17.1)
BKR WAM IMMATURE GRANULOCYTES: 0.6 % (ref 0.0–1.0)
BKR WAM LYMPHOCYTES: 26.3 % (ref 17.0–50.0)
BKR WAM MCH (PG): 33.1 pg — ABNORMAL HIGH (ref 27.0–33.0)
BKR WAM MCHC: 35.2 g/dL (ref 31.0–36.0)
BKR WAM MCV: 94.3 fL (ref 80.0–100.0)
BKR WAM MONOCYTE ABSOLUTE COUNT.: 1.87 x 1000/ÂµL — ABNORMAL HIGH (ref 0.00–1.00)
BKR WAM MONOCYTES: 15.7 % — ABNORMAL HIGH (ref 4.0–12.0)
BKR WAM MPV: 9.2 fL (ref 8.0–12.0)
BKR WAM NEUTROPHILS: 55.3 % (ref 39.0–72.0)
BKR WAM NUCLEATED RED BLOOD CELLS: 1.2 % — ABNORMAL HIGH (ref 0.0–1.0)
BKR WAM PLATELETS: 659 x1000/ÂµL — ABNORMAL HIGH (ref 150–420)
BKR WAM RDW-CV: 18.3 % — ABNORMAL HIGH (ref 11.0–15.0)
BKR WAM RED BLOOD CELL COUNT.: 1.75 M/ÂµL — ABNORMAL LOW (ref 4.00–6.00)
BKR WAM WHITE BLOOD CELL COUNT: 11.9 x1000/ÂµL — ABNORMAL HIGH (ref 4.0–11.0)

## 2020-09-24 MED ORDER — BISACODYL 5 MG TABLET,DELAYED RELEASE
5 mg | ORAL_TABLET | Freq: Every day | ORAL | 1 refills | Status: AC | PRN
Start: 2020-09-24 — End: ?

## 2020-09-24 MED ORDER — ONDANSETRON 4 MG DISINTEGRATING TABLET
4 mg | ORAL_TABLET | Freq: Four times a day (QID) | ORAL | 1 refills | Status: AC | PRN
Start: 2020-09-24 — End: 2021-07-09

## 2020-09-24 MED ORDER — SENNOSIDES 8.6 MG-DOCUSATE SODIUM 50 MG TABLET
ORAL_TABLET | Freq: Two times a day (BID) | ORAL | 1 refills | Status: AC
Start: 2020-09-24 — End: ?

## 2020-09-24 MED ORDER — ACETAMINOPHEN 325 MG TABLET
325 mg | ORAL_TABLET | Freq: Four times a day (QID) | ORAL | 1 refills | Status: AC
Start: 2020-09-24 — End: ?

## 2020-09-24 MED ORDER — DIAZEPAM 2 MG TABLET
2 mg | ORAL_TABLET | Freq: Four times a day (QID) | ORAL | 1 refills | Status: AC | PRN
Start: 2020-09-24 — End: ?

## 2020-09-24 MED ORDER — NALOXONE 4 MG/ACTUATION NASAL SPRAY
4 mg/actuation | 1 refills | Status: SS
Start: 2020-09-24 — End: 2021-05-19

## 2020-09-24 MED ORDER — APIXABAN 2.5 MG TABLET
2.5 mg | ORAL_TABLET | Freq: Two times a day (BID) | ORAL | 1 refills | Status: AC
Start: 2020-09-24 — End: ?

## 2020-09-24 MED ORDER — POLYETHYLENE GLYCOL 3350 17 GRAM ORAL POWDER PACKET
17 gram | PACK | Freq: Every day | ORAL | 1 refills | Status: AC
Start: 2020-09-24 — End: ?

## 2020-09-24 MED ORDER — OXYCODONE IMMEDIATE RELEASE 15 MG TABLET
15 mg | ORAL_TABLET | 1 refills | Status: AC
Start: 2020-09-24 — End: 2020-10-08

## 2020-09-24 MED ORDER — BISACODYL 10 MG RECTAL SUPPOSITORY
10 mg | Freq: Every day | RECTAL | 1 refills | Status: AC | PRN
Start: 2020-09-24 — End: ?

## 2020-09-24 NOTE — Plan of Care
Plan of Care Overview/ Patient Status    1900-0700. Pt A&O x4. VS as documented on RA. +CMS. Aquacel to L hip, ACE to entire LLE. +DPP. Pain controlled with oxy 15mg  and 30mg , tylenol. Independent on crutches. VS in bathroom. Safety maintained. Plan for DC 3/24. See flowsheets for more details.

## 2020-09-24 NOTE — Discharge Summary
[facility] Discharge SummaryPatient Data:  Patient Name: Phillip Bell Admit date: 09/21/2020 Age: 25 y.o. 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	TDWB to LLE with crutches2.	Continue Eliquis 2.5 mg BID  for DVT PPX for 35 days post op3.	F/u post op appointment on 10-06-20 at 9 amRelevant Medications on Discharge:New Medications: Eliquis 2.5 mg BID x 35 days for DVT PPX.Pending Labs and Tests: Pending Lab Results   Order Current Status  Surgical case     [facility]) In process  AFB culture - Includes AFB Stain - Do NOT PLACE AFB SAMPLES IN AN E-SWAB (Must go into sterile container only) Preliminary result  AFB culture - Includes AFB Stain - Do NOT PLACE AFB SAMPLES IN AN E-SWAB (Must go into sterile container only) Preliminary result  Fungal culture - Includes Fungal Stain Preliminary result  Fungal culture - Includes Fungal Stain Preliminary result  Fungal culture - Includes Fungal Stain Preliminary result  Hardware or device cultures - Includes Gram Stain and Anaerobic Culture Preliminary result  Tissue culture - Includes Gram Stain and Anaerobic Culture Preliminary result  Tissue culture - Includes Gram Stain and Anaerobic Culture Preliminary result  Follow-up Information:[name], [facility] [place] [phone] 4/5/2022At 9 am , For wound re-check, for post op. visit Future Appointments Date Time Provider Department Center 10/06/2020  9:00 AM Luis Abed, MD ORT [facility] Hospital Course: Hospital Course: Hospital CoursePatient is a 25 y.o. 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 Artist Pais, [name], MDDate of Surgery: 3/21/2022Intra-Op Complications: NoneAcute events in the during the hospital stay:NoneConsults:  (Consulting physician, problem, recs, f/u info)Hematology who recommended to transfuse if Hcto<15, Pt cleared by then to be discharged on 03-24-22Bladder Function Concerns:	NoneDVT Prophylaxis:  (Medication, duration, lab monitoring)	Lovenox while in house and ELiquis 2.5 mg BID at d/c for 35 days post opHome Medication Changes:  (with explanation) NoneWeight Bearing Status: TDWB to LLE Inpatient Consultants and summary of recommendations:Hematology for Sickle cell disease managementPertinent Procedures or Surgeries: Surgical and Procedural Summary this Admission   Past Procedures (09/21/2020 to Today)   Date Procedures Providers Location  09/21/2020 Open reduction internal fixaton left femur nounion----[facility] OR    Pertinent lab findings and test results: Objective: Recent Labs Lab 03/21/221705 03/21/221705 03/23/220526 03/23/220526 03/24/220544 WBC 13.7*  --  10.2  --  11.9* HGB 7.6*   < > 5.9*   < > 5.8* HCT 22.90*   < > 16.80*   < > 16.50* PLT 469*   < > 516*   < > 659*  < > = values in this interval not displayed.  Recent Labs Lab 03/21/221705 03/23/220526 03/24/220544 NEUTROPHILS 80.7* 58.8 55.3  Recent Labs Lab 03/21/221705 03/21/221705 03/23/220526 NA 137  --  137 K 4.8   < > 4.0 CL 105   < > 102 CO2 24   < >  28 BUN 16   < > 11 CREATININE 0.76   < > 0.62 GLU 131*   < > 119* ANIONGAP 8   < > 7  < > = values in this interval not displayed.  Recent Labs Lab 03/21/221705 03/23/220526 CALCIUM 8.4* 8.7*  No results for input(s): ALT, AST, ALKPHOS, BILITOT, BILIDIR in the last 168 hours. No results for input(s): PTT, LABPROT, INR in the last 168 hours. Culture Information:No results for input(s): LABBLOO, LABURIN, LOWERRESPIRA in the last 168 hours.Imaging: Imaging results last 24h: No results found.Diet:  Regular dietMobility: Highest Level of mobility - ACTUAL: Mobility Level 5, Stand for 1 minute, AM PAC 16-17Physical Therapy Disposition Recommendation: HomeAdditional Physical Therapy Disposition Recommendations: Home with family supportPhysical Exam Discharge vitals: Temp:  [98 ?F (36.7 ?C)-99.6 ?F (37.6 ?C)] 98.3 ?F (36.8 ?C)Pulse:  [86-97] 97Resp:  [16-20] 18BP: (113-140)/(73-80) 136/73SpO2:  [96 %-100 %] 100 %Device (Oxygen Therapy): room air Pertinent Findings of Physical Exam: UnremarkableCognitive Status at Discharge: BaselineDischarge Physical Exam:Physical ExamPerformed the day of dischargeLeft LKG:[id number] are clean, dry and intact. Aquacel in place.Distal sensation is grossly intact to light touchCalves are soft and non-tenderPalpable 2+ DP/PT pulsesToes are warm and well-perfused, capillary refill <2 seconds Intact tibialis anterior, extensor hallucis longus, flexor hallucis longus, and gastrocnemius Allergies Allergies Allergen Reactions ? Nickel Rash  PMH PSH Past Medical History: Diagnosis Date ? Aplastic crisis (HC Code) (HC CODE) 6/6/ 2005 transfusion ? Avascular necrosis of femur head, left (HC Code) (HC CODE)  ? Chronic pain   sickle cell ? Conductive hearing loss 09/22/14 P.E tubes placed ? Dactylitis one episode  April, 1998 ? GERD (gastroesophageal reflux disease)  ? 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 On hydroxyurea therapy started October 2013 ? Pneumococcal vaccination given 01/20/2006 and 09/28/2012 ? 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 CHOLECYSTECTOMY, LAPAROSCOPIC  1/15 ? TYMPANOSTOMY TUBE PLACEMENT  09/22/2014  by Dr. Bernita Raisin  Social History Family History Social History Tobacco Use ? Smoking status: Never Smoker ? Smokeless tobacco: Never Used Substance Use Topics ? Alcohol use: Yes   Comment: beer and liquor on occasion  Family History Problem Relation Age of Onset ? Sickle cell trait Mother  ? Thyroid disease Mother  ? Sickle cell trait Father  ? Sarcoidosis Father  ? Diabetes Paternal Grandfather  ? Hypertension Paternal Grandfather  ? Alcohol abuse Maternal Aunt   Discharge Medications: Discharge: Current Discharge Medication List  START taking these medications  Details acetaminophen (TYLENOL) 325 mg tablet Take 3 tablets (975 mg total) by mouth every 6 (six) hours for 10 days.Qty: 120 tablet, Refills: 0Start date: 09/24/2020, End date: 10/04/2020  apixaban (ELIQUIS) 2.5 mg tablet Take 1 tablet (2.5 mg total) by mouth 2 (two) times daily.Qty: 68 tablet, Refills: 0Start date: 09/24/2020, End date: 10/28/2020  bisacodyL (DULCOLAX) 10 mg suppository Place 1 suppository (10 mg total) rectally daily as needed for constipation for up to 10 days.Qty: 10 suppository, Refills: 0Start date: 09/24/2020, End date: 10/04/2020  bisacodyL (DULCOLAX) 5 mg EC tablet Take 1 tablet (5 mg total) by mouth daily as needed for constipation (Constipation) for up to 10 days.Qty: 10 tablet, Refills: 0Start date: 09/24/2020, End date: 10/04/2020  diazePAM (VALIUM) 2 mg tablet Take 1 tablet (2 mg  total) by mouth every 6 (six) hours as needed for up to 10 days.Qty: 30 tablet, Refills: 0Start date: 09/24/2020, End date: 10/04/2020  ondansetron (ZOFRAN-ODT) 4 mg disintegrating tablet Take 1 tablet (4 mg total) by mouth every 6 (six) hours as needed for up to 7 doses.Qty: 7 tablet, Refills: 0Start date: 09/24/2020  polyethylene glycol (MIRALAX) 17 gram packet Take 1 packet (17 g total) by mouth daily for 10 days. Mix in 8 ounces of water, juice, soda, coffee or tea prior to taking.Qty: 10 packet, Refills: 0Start date: 09/24/2020, End date: 10/04/2020  senna-docusate (SENNA-PLUS) 8.6-50 mg tablet Take 2 tablets by mouth 2 (two) times daily for 10 days.Qty: 40 tablet, Refills: 0Start date: 09/24/2020, End date: 10/04/2020   CONTINUE these medications which have CHANGED  Details naloxone (NARCAN) 4 mg/actuation nasal spray Use 1 spray in 1 nostril for suspected opioid overdose. May repeat in 2 minutes in other nostril with new device if minimal or no response.Qty: 2 each, Refills: 0Start date: 09/24/2020  oxyCODONE (ROXICODONE) 15 mg Immediate Release tablet Take 1 to 2 tablets by mouth every 4-6 hours as needed for moderate or severe pain respectively. Max 8 tabs per day.Qty: 80 tablet, Refills: 0Start date: 09/24/2020   CONTINUE these medications which have NOT CHANGED  Details hydroxyurea (HYDREA) 500 mg capsule 3 tab daily for blood.Qty: 90 capsule, Refills: 2  ibuprofen (ADVIL,MOTRIN) 600 mg tablet Up to 4 tablets daily as needed for pain.Qty: 100 tablet, Refills: 1  cefuroxime (CEFTIN) 500 mg tablet Take 1 tablet (500 mg total) by mouth every 12 (twelve) hours.Qty: 5 tablet, Refills: 0  doxycycline hyclate (VIBRA-TABS) 100 mg tablet Take 1 tablet (100 mg total) by mouth every 12 (twelve) hours.Qty: 4 tablet, Refills: 0  senna (SENOKOT) 8.6 mg tablet Take 2 tablets (17.2 mg total) by mouth every 3 (three) hours as needed for constipation.Qty: 30 tablet, Refills: 0   STOP taking these medications   oxyCODONE myristate (XTAMPZA ER) 13.5 mg 12 hr extended release sprinkle capsule    There was at total of approximately 30 minutes spent on the discharge of this patientElectronically Signed:Electronically Signed by Fabio Pierce, PA, September 24, 2020

## 2020-09-24 NOTE — Discharge Instructions
Discharge Instructions Your Surgeon was: Dr. Artist Pais, [name], MDYour Procedure was: [facility] and revision ORIF left femur on 09-21-20 .Date of Surgery: 3/21/2022Follow-Up Appointment: 10-06-20 at 9 am General Instructions: -Please call if there are any questions or concerns. -Call if you develop a fever, worsening pain, redness, numbness, weakness, coolness, bleeding, or swelling to surgical site or extremity. -Do not hesitate to go to the Emergency Department if you are concerned.Diet: You are able to resume your normal diet.Bowel: - Please take your prescribed stool softener while you are taking narcotics and remember to keep yourself well hydrated.-If you have had no bowel movement by your third day after surgery, please add daily MiraLax to your stool softener regimen.  -If no bowel movement by your fifth day after surgery, please contact your surgeon. Activity: -You are to remain touch down weight-bearing  to the Left lower extremity with crutches. -Ambulate with assistive devices as needed.  -Continue to follow all precautions as instructed by physical therapy.  -We recommend that you are out of bed for all meals and participate in daily physical therapy with ambulation program.   Wound Care: Keep original  dressing  in place until post-operative follow up. The dressing and staples/sutures will most likely be removed by your Surgeon?s office at the time of your initial follow-up appointment. You may shower however please keep dressing dry at all times, do not submerge incision in a bathtub, pool, or hot tub. Do not put any cream, lotion or ointment on incision(s).Patient Care: -Apply ice to the operative site three times per day and/or as needed for added pain relief.-Continue elevation of the operative extremity as directed when in bed or chair to prevent swelling and reduce pain.Blood Clot Prevention: Please continue Eliquis 2.5 mg twice every day for a total of 35 days post-operatively. Pain Management: -You are prescribed Tylenol to take four times a day for ten days. Do not exceed 3,000mg  per day of Tylenol.  -Take pain medication as prescribed by your Surgeon.	 NARCOTIC SAFETY INSTRUCTIONS You are being prescribed narcotic pain medication as part of your discharge regimen. Please exercise caution when using these medications and only use as directed by your physician. Overdose of narcotics can be fatal. Signs of narcotic overdose include:- Decreased consciousness- Pinpoint pupils- Slowed breathing ratePlease instruct your friends and family to be aware of these warning signs. If you believe you or someone you know has taken too many pills, please contact a physician immediately or call 911. Narcotic pain medication can also cause constipation. Please take a stool softener while you are taking narcotics and remember to keep yourself well hydrated.  NARCOTIC DISPOSAL INSTRUCTIONSHaving excess narcotic pain medication at home can be a safety issue for you and those around you. We encourage you to dispose of any additional narcotic medications after your surgical pain subsides, which typically occurs 3-5 days after your operation. Narcotic pain medicine should never be saved for future use. To properly dispose of excess narcotics at home you can simply flush the medication down the toilet. Alternatively, a more environmentally friendly option is to drop off your additional narcotic medication at a participating local police department. In [facility], you may drop off your medications at the [facility] Department located at [address] [place], [place] [address]. For the address of the participating police station nearest you, please visit: [email or web address] Future Appointments: Future Appointments Date Time Provider Department Center 10/06/2020  9:00 AM Luis Abed, MD ORT [facility] Continue  Eliquis 2.5 BID x 35 days post Op TDWB to LLE with crutches F/u post op appointment on 10-06-20 at 9 am

## 2020-09-24 NOTE — Progress Notes
Orthopaedic Progress NoteDOS: 09/21/20 - ROH and revision ORIF left femur nonunionSubjective:Transition off PCA yesterday. Pain controlled with PO medications.Ambulating with crutches independently. Working with PTHgb 5.8/16.5 -> transfusion threshold 15% Hct per hematologyPhysical Exam:Left ZOX:WRUEAVWUJ are clean, dry and intact. Aquacel in place.Distal sensation is grossly intact to light touchCalves are soft and non-tenderPalpable 2+ DP/PT pulsesToes are warm and well-perfused, capillary refill <2 seconds Intact tibialis anterior, extensor hallucis longus, flexor hallucis longus, and gastrocnemius Temp:  [97.4 ?F (36.3 ?C)-99.6 ?F (37.6 ?C)] 98.2 ?F (36.8 ?C)Pulse:  [78-96] 86Resp:  [16-20] 19BP: (113-140)/(73-80) 140/75SpO2:  [96 %-100 %] 100 %Lab Results Component Value Date  WBC 11.9 (H) 09/24/2020  HGB 5.8 (LL) 09/24/2020  HCT 16.50 (L) 09/24/2020 Lab Results Component Value Date  CREATININE 0.62 09/23/2020 Current Facility-Administered Medications Medication ? acetaminophen (TYLENOL) tablet 975 mg ? bisacodyL (DULCOLAX) EC tablet 5 mg ? bisacodyL (DULCOLAX) suppository 10 mg ? diazePAM (VALIUM) tablet 2 mg ? enoxaparin (LOVENOX) syringe 40 mg ? hydroxyurea (HYDREA) capsule 1,500 mg ? naloxone (NARCAN) injection 0.04 mg ? ondansetron (PF) (ZOFRAN) injection 4 mg ? ondansetron (PF) (ZOFRAN) injection 4 mg ? ondansetron (ZOFRAN-ODT) disintegrating tablet 4 mg ? oxyCODONE (ROXICODONE) Immediate Release tablet 15 mg ? oxyCODONE (ROXICODONE) Immediate Release tablet 30 mg ? polyethylene glycol (MIRALAX) packet 17 g ? senna-docusate (SENNA-PLUS) 8.6-50 mg per tablet 2 tablet ? sodium chloride 0.9 % flush 3 mL Assessment and Plan:Phillip Bell is a 25 y.o. M hx sickle cell anemia s/p ROH and revision ORIF left femur on 09/21/20-Hgb 5.8/16.5 -> transfusion threshold 15% Hct per hematology-TDWB LLE w/ crutches-PT recc home-Appreciate hematology reccs-Appreciate Pain service reccs-Regular diet-Dispo: cleared for home per PTContact information:Ortho Trauma TeamWeekdays 24 hour: [phone]: Check amion for Orthopedic resident on callResident/Fellow Team: Rosana Hoes, Lui Bellis, Karie Georges, Alim RamjiFuture Appointments Date Time Provider Department Center 10/06/2020  9:00 AM Luis Abed, MD ORT [facility]

## 2020-09-24 NOTE — Plan of Care
Plan of Care Overview/ Patient Status    Phillip Bell was discharged via Private Car accompanied by Parent.  Verbalized understanding of discharge instructionsand recommended follow up care as per the after visit summary.  Written discharge instructions provided. Denies any further questions. Vital signs    Vitals:  09/23/20 2025 09/24/20 0001 09/24/20 0452 09/24/20 0743 BP: 126/80 130/76 (!) 140/75 136/73 Pulse: 90 87 86 (!) 97 Resp: 20 20 19 18  Temp: 98.7 ?F (37.1 ?C) 99.6 ?F (37.6 ?C) 98.2 ?F (36.8 ?C) 98.3 ?F (36.8 ?C) TempSrc: Oral Oral Oral Oral SpO2: 100% 100% 100% 100% Weight:     Height:     Patient confirmed all belongings returned. Belongings charted in last 7 days: Valuable(s) : Cane;Clothing;Vision;Wallet;Keys;Laptop;Cell Phone;Money (09/21/2020  5:45 PM)

## 2020-09-24 NOTE — Plan of Care
Plan of Care Overview/ Patient Status    Problem: Adult Inpatient Plan of CareGoal: Readiness for Transition of CareOutcome: Outcome(s) achieved Case Management Plan    Most Recent Value Discharge Planning Patient/Patient Representative goals/treatment preferences for discharge are:  home with family support Patient/Patient Representative was presented with a list of facilities, agencies and/or dme providers and Referral(s) placed for: None Mode of Transportation  Private car  (add comment for special considerations) Patient accompanied by Mother CM D/C Readiness PASRR completed and approved N/A Authorization number obtained, if required N/A Is there a 3 day INPATIENT Qualifying stay for Medicare Patients? N/A Medicare IM- signed, dated, timed and scanned, if required Yes DME Authorized/Delivered Yes Post acute care services secured W10 complete N/A Pri Completed and Accepted  No Is the destination address correct on the W10 N/A Finalized Plan Expected Discharge Date 09/24/20 Discharge Disposition Home or Self Care   Pt medically cleared for discharge by provider. Pt discharging home independently. Pt ambulating independently. Family transport anticipated at discharge.  Lovell Sheehan, RN,MSN [facility] Manager, Care ManagementMHB:[phone]

## 2020-09-26 LAB — TISSUE CULTURE     (BH GH LMW YH)
BKR GRAM STAIN (ABNORMAL): NONE SEEN
BKR GRAM STAIN (ABNORMAL): NONE SEEN
BKR TISSUE CULTURE: NO GROWTH
BKR TISSUE CULTURE: NO GROWTH

## 2020-09-29 ENCOUNTER — Telehealth
Admit: 2020-09-29 | Payer: PRIVATE HEALTH INSURANCE | Attending: Orthopaedic Trauma | Primary: Student in an Organized Health Care Education/Training Program

## 2020-09-29 NOTE — Telephone Encounter
Patient would like to reschedule his postop appt to the following week. Please advise.

## 2020-10-01 LAB — HARDWARE OR DEVICE CULTURES     (BH GH LMW YH)
BKR GRAM STAIN (ROUTINE): NONE SEEN
BKR HARDWARE OR DEVICE CULTURE: NO GROWTH

## 2020-10-06 ENCOUNTER — Encounter
Admit: 2020-10-06 | Payer: BLUE CROSS/BLUE SHIELD | Attending: Orthopaedic Trauma | Primary: Student in an Organized Health Care Education/Training Program

## 2020-10-08 ENCOUNTER — Encounter
Admit: 2020-10-08 | Payer: PRIVATE HEALTH INSURANCE | Attending: Family | Primary: Student in an Organized Health Care Education/Training Program

## 2020-10-08 ENCOUNTER — Telehealth
Admit: 2020-10-08 | Payer: PRIVATE HEALTH INSURANCE | Attending: Hematology & Oncology | Primary: Student in an Organized Health Care Education/Training Program

## 2020-10-08 MED ORDER — OXYCODONE IMMEDIATE RELEASE 15 MG TABLET
15 mg | ORAL_TABLET | 1 refills | Status: AC
Start: 2020-10-08 — End: 2020-10-27

## 2020-10-08 MED ORDER — OXYCODONE ER 13.5 MG CAPSULE SPRINKLE EXTEND RELEASE 12HR(DON'T CRUSH)
13.5 mg | ORAL_CAPSULE | Freq: Two times a day (BID) | ORAL | 1 refills | Status: AC
Start: 2020-10-08 — End: 2020-11-20

## 2020-10-08 MED ORDER — HYDROXYUREA 500 MG CAPSULE
500 mg | ORAL_CAPSULE | 3 refills | Status: AC
Start: 2020-10-08 — End: 2020-12-22

## 2020-10-08 NOTE — Telephone Encounter
Pt of Dr. Su Hilt Looking for a refill on his oxyCODONE (ROXICODONE) 15 mg Immediate Release tablet and Xtampza 13.5mg  ER and hydroxyurea (HYDREA) 500 mg capsuleCV[facility] [address] [place]

## 2020-10-08 NOTE — Telephone Encounter
I spoke with [facility] and informed him medications have been called in. He appreciated the call.

## 2020-10-12 ENCOUNTER — Ambulatory Visit
Admit: 2020-10-12 | Payer: PRIVATE HEALTH INSURANCE | Attending: Family | Primary: Student in an Organized Health Care Education/Training Program

## 2020-10-13 ENCOUNTER — Encounter
Admit: 2020-10-13 | Payer: BLUE CROSS/BLUE SHIELD | Attending: Orthopaedic Trauma | Primary: Student in an Organized Health Care Education/Training Program

## 2020-10-13 ENCOUNTER — Inpatient Hospital Stay
Admit: 2020-10-13 | Discharge: 2020-10-13 | Payer: BLUE CROSS/BLUE SHIELD | Primary: Student in an Organized Health Care Education/Training Program

## 2020-10-13 ENCOUNTER — Encounter
Admit: 2020-10-13 | Payer: PRIVATE HEALTH INSURANCE | Attending: Orthopaedic Trauma | Primary: Student in an Organized Health Care Education/Training Program

## 2020-10-13 DIAGNOSIS — D571 Sickle-cell disease without crisis: Secondary | ICD-10-CM

## 2020-10-13 DIAGNOSIS — IMO0002 Dactylitis: Secondary | ICD-10-CM

## 2020-10-13 DIAGNOSIS — S72362A Displaced segmental fracture of shaft of left femur, initial encounter for closed fracture: Secondary | ICD-10-CM

## 2020-10-13 DIAGNOSIS — D6189 Other specified aplastic anemias and other bone marrow failure syndromes: Secondary | ICD-10-CM

## 2020-10-13 DIAGNOSIS — Z23 Encounter for immunization: Secondary | ICD-10-CM

## 2020-10-13 DIAGNOSIS — K219 Gastro-esophageal reflux disease without esophagitis: Secondary | ICD-10-CM

## 2020-10-13 DIAGNOSIS — D7389 Other diseases of spleen: Secondary | ICD-10-CM

## 2020-10-13 DIAGNOSIS — D5700 Hb-SS disease with crisis, unspecified: Secondary | ICD-10-CM

## 2020-10-13 DIAGNOSIS — Z7964 On hydroxyurea therapy: Secondary | ICD-10-CM

## 2020-10-13 DIAGNOSIS — M87052 Idiopathic aseptic necrosis of left femur: Secondary | ICD-10-CM

## 2020-10-13 DIAGNOSIS — H902 Conductive hearing loss, unspecified: Secondary | ICD-10-CM

## 2020-10-13 DIAGNOSIS — G8929 Other chronic pain: Secondary | ICD-10-CM

## 2020-10-13 NOTE — Progress Notes
Status post osteo periosteal flap elevation for left femur nonunion repair and plating.Surgery performed on 03/21/2022Patient returns for 1st postoperative visit.  He is doing relatively well.  He states his pain is improved since surgery.  No constitutional symptoms.  No obvious deformity to left lower extremity.  Patient's incision is well coapted without any obvious signs of drainage peri-incisional erythema or fluctuance.There is no obvious angular deformity to his left thighIs able to extend and flex his knee extend and flex his anklePlan:Weightbearing as tolerated activity and strengthening as tolerated left lower extremity.

## 2020-10-13 NOTE — Progress Notes
Adult Sickle Cell ProgramYN[facility] [phone]/14/2022[name] DOB 1997 CCScheduled return for sickle cell disease.ProbPatient Active Problem List Diagnosis ? Eustachian tube disorder, bilateral ? Tonsillar and adenoid hypertrophy ? Sickle cell disease, type SS (HC Code) (HC CODE) ? 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 Hx acute chest syndrome (HC Code) ? Hx cholecystectomy ? Closed displaced segmental fracture of shaft of left femur, initial encounter (HC Code) (HC CODE) ? Sickle cell disease without crisis (HC Code) (HC CODE) ? Decreased range of motion (ROM) of left knee ? Decreased strength of lower extremity ? Altered gait ? Sickle cell disease with crisis (HC Code) (HC CODE) ? Pneumonia ? Acute chest syndrome [facility] CODE) MDIn MD in [place] where he is in school, q22m, sickle cell clinic. Advised to have MD in [facility]. Brief HistorySickle cell disease (HbSS)Multiple hospitalizations for acute sickle cell painAvascular necrosis of left femoral headHx acute chest syndromeHx blood transfusionOther PMHxSurgical Hx1/15	Cholecystectomy3/16	PE tubes2017	Wisdom teeth removedFMHxMedCurrent Outpatient Medications Medication Sig ? hydroxyurea (HYDREA) 500 mg capsule 3 tab daily for blood. ? ibuprofen (ADVIL,MOTRIN) 600 mg tablet Up to 4 tablets daily as needed for pain. ? oxyCODONE (ROXICODONE) 15 mg Immediate Release tablet 1 - 3 tablets every 3 h as needed for pain. ? oxyCODONE myristate (XTAMPZA ER) 13.5 mg 12 hr extended release sprinkle capsule Take 1 capsule (13.5 mg total) by mouth every 12 (twelve) hours. Long-acting medicine to control pain. 340b. D57.1. For prior authorization Fax [phone]. ? senna (SENOKOT) 8.6 mg tablet Take 2 tablets (17.2 mg total) by mouth every 3 (three) hours as needed for constipation. ? cefuroxime (CEFTIN) 500 mg tablet Take 1 tablet (500 mg total) by mouth every 12 (twelve) hours. (Patient not taking: Reported on 06/22/2020) ? doxycycline hyclate (VIBRA-TABS) 100 mg tablet Take 1 tablet (100 mg total) by mouth every 12 (twelve) hours. (Patient not taking: Reported on 06/22/2020) ? Miscellaneous Medical Supply Heel lift Left shoeUse as directed. (Patient not taking: Reported on 09/08/2020) ? naloxone (NARCAN) 4 mg/actuation spray Use 1 spray in 1 nostril for suspected opioid overdose. May repeat in 2 minutes in other nostril with new device if minimal or no response. No current facility-administered medications for this visit. AllergiesNo Known Allergies Interval Hx/ROSMitchel presents for pre-op evaluation.  He is scheduled for left femur surgery on 09/21/2020.  Reports he is feeling well. Discussed transfusion goal Hct 28-30 prior to his surgery. If transfusion needed, will contact [facility] for availability.Denies fever, chills, SOB, chest pain. PEBP (P) 116/64  - Pulse (P) 61  - Temp (P) 98.1 ?F (36.7 ?C)  - Resp (P) 19  - Wt (P) 67.2 kg  - SpO2 (P) 98%  - BMI (P) 19.55 kg/m?  General: alert, conversant, nadEyes: anicteric sclera, corrective lensesCV:  rrrResp:  cta b/lAbdomen: soft, non-tender, bowel sounds presentPsych: normal affectInterval Lab/ImagingResults for Phillip Bell (MRN [id number]) as of 10/13/2020 15:24 Ref. Range 08/20/2020 15:32 Sodium Latest Ref Range: 136 - 144 mmol/L 139 Potassium Latest Ref Range: 3.3 - 5.3 mmol/L 4.0 Chloride Latest Ref Range: 98 - 107 mmol/L 106 CO2 Latest Ref Range: 20 - 30 mmol/L 22 Anion Gap Latest Ref Range: 7 - 17  11 BUN Latest Ref Range: 6 - 20 mg/dL 10 Creatinine Latest Ref Range: 0.40 - 1.30 mg/dL 8.29 BUN/Creatinine Ratio Latest Ref Range: 8.0 - 23.0  13.0 eGFR (NON African-American) Latest Ref Range: >60 mL/min/1.25m2 >60 eGFR (Afr Amer) Latest Ref Range: >60 mL/min/1.53m2 >60 Glucose Latest Ref Range: 70 - 100 mg/dL 562 (  H) Calcium Latest Ref Range: 8.8 - 10.2 mg/dL 9.2 Total Bilirubin Latest Ref Range: <=1.2 mg/dL 2.0 (H) Bilirubin, Direct Latest Ref Range: <=0.3 mg/dL 0.4 (H) Alkaline Phosphatase Latest Ref Range: 9 - 122 U/L 86 Alanine Aminotransferase (ALT) Latest Ref Range: 9 - 59 U/L 17 Aspartate Aminotransferase (AST) Latest Ref Range: 10 - 35 U/L 37 (H) AST/ALT Ratio Latest Ref Range: See Comment  2.2 Total Protein Latest Ref Range: 6.6 - 8.7 g/dL 7.7 Albumin Latest Ref Range: 3.6 - 4.9 g/dL 4.7 Globulin Latest Ref Range: 2.3 - 3.5 g/dL 3.0 A/G Ratio Latest Ref Range: 1.0 - 2.2  1.6 CRP, High Sensitivity Latest Ref Range: See comment mg/L 4.5 (H) WBC Latest Ref Range: 4.0 - 11.0 x1000/?L 12.9 (H) RBC Latest Ref Range: 4.00 - 6.00 M/?L 2.36 (L) Hemoglobin Latest Ref Range: 13.2 - 17.1 g/dL 8.1 (L) Hematocrit Latest Ref Range: 38.50 - 50.00 % 22.80 (L) MCV Latest Ref Range: 80.0 - 100.0 fL 96.6 MCH Latest Ref Range: 27.0 - 33.0 pg 34.3 (H) MCHC Latest Ref Range: 31.0 - 36.0 g/dL 16.1 RDW-CV Latest Ref Range: 11.0 - 15.0 % 21.4 (H) Platelets Latest Ref Range: 150 - 420 x1000/?L 681 (H) MPV Latest Ref Range: 8.0 - 12.0 fL 8.6 Neutrophils Latest Ref Range: 39.0 - 72.0 % 63.8 Lymphocytes Latest Ref Range: 17.0 - 50.0 % 20.8 Monocytes Latest Ref Range: 4.0 - 12.0 % 10.2 Eosinophils Latest Ref Range: 0.0 - 5.0 % 4.0 Basophils Latest Ref Range: 0.0 - 1.4 % 0.8 Immature Granulocytes Latest Ref Range: 0.0 - 1.0 % 0.4 nRBC Latest Ref Range: 0.0 - 1.0 % 3.0 (H) ANC (Abs Neutrophil Count) Latest Ref Range: 2.00 - 7.60 x 1000/?L 8.20 (H) Absolute Lymphocyte Count Latest Ref Range: 0.60 - 3.70 x 1000/?L 2.67 Monocytes (Absolute) Latest Ref Range: 0.00 - 1.00 x 1000/?L 1.31 (H) Eosinophil Absolute Count Latest Ref Range: 0.00 - 1.00 x 1000/?L 0.52 Basophils Absolute Latest Ref Range: 0.00 - 1.00 x 1000/?L 0.10 Immature Granulocytes (Abs) Latest Ref Range: 0.00 - 0.30 x 1000/?L 0.05 nRBC Absolute Latest Ref Range: 0.00 - 1.00 x 1000/?L 0.40 Retic [place] Pct Latest Ref Range: 0.6 - 2.7 % 13.6 (H) Absolute Reticulocyte Count Latest Ref Range: 0.023 - 0.140 10?6 cells/uL 0.321 (H) Immature Retic Fract Latest Ref Range: 3.0 - 15.9 % 37.1 (H) Reticulocyte Hgb Equivalent Latest Ref Range: 28.2 - 35.7 pg 36.5 (H) Imp/PlanCovid-19?	Discussed 4/20.?	01/16/2020: Vaccinated x 2. ?	07/13/2020:  Eligible for 3rd dose.Sickle cell disease (Hbxx)?	02/18/2019: On hydroxyurea 500 mg 3 tab daily. Reluctant to increase dose. Sent names of 3 new drugs, glutamine, voxelotor, crizanlizumab. ?	03/20/2019: Read about 2 of 3 meds. No interest at this time. ?	01/16/2020?	Hydroxyurea: Taking 3 tab daily.?	Glutamine: not interested?	Voxelotor: no indication?	Crizanlizumab: not interested?	05/18/2020:  Continues hydroxyurea 3 tabs daily. Transfusion Hx?	09/11/2019: Transfusion [place] [facility] [place] [facility] in [place]. Social situation?	Lives: parents in [place]. 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 Interests: hanging out, applying for jobs?	01/16/2020: Continues to live with parents. 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 Also considering moving to [employer] (college there).  ?	03/18/2020:  Starting a new job in 1 week. ?	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 Discharged on apixaban, apparently stopped it early. 03/18/2020:  In follow up with Ortho. 05/18/2020: Would like to return back  to having physical therapy, he will communicate with Orthopedic.07/13/2020:  Has follow up with Ortho tomorrow. 09/14/2020:  Scheduled for surgery on 09/21/2020. Lab work obtained today. Discussed hct goal 28-30 prior to surgery. Intermittent pain?	Typical sickle cell pain?	Oxycodone 10 mg tab and oxycodone ERT 10 mg #60 uses irregularly but rationally for persistent pain crisis. Last dispensed 7/19 according to PMP. ?	02/18/2019: doing well on oxycodone IR 10 mg #60 and oxycodone ERT 10 mg #60 every month. ?	10/09/2019: Continue as above. No request for Rx's today.?	03/18/2020:  Continue as above. Will request prior authorization from PARS department.?	11/15/2021Marlowe Kays and oxycodone continue?	09/14/2020:  Will communicate Ortho regarding a pain management plan.Medical marijuana?	Discussed 07/17/17. Certified and accessed thereafter. ?	10/09/2019: Re-certified 3/21.?	01/16/2020: Trying to be clean.Health MaintenanceFYI: 3/10/2021Urine tox screen: OK 07/17/17.PMP: OK 3/10/2021Naloxone: in possession 3/10/2021Urine prot/creat ratio: 0.09 1/19Iron status: 643 7/17IV access: OKCigarettes: noAlcohol: occassionalEye: Sees community MD ophth Dr Luanne Bras: reports q102mVaccine/AntibodiesInfluenza: 10/2019PPV23: 10/15, 3/14PCV13: 7/12Men-4: 7/14, 7/11Men-B: 7/19Tdap: 4/21Hep A: Ab pos 1/19Hep B: sAb neg 1/19.  Dose #1 09/07/17; Dose #2 04/16/18 #3 4/07/2021Hep C: Ab neg 1/19Varicella: Ab pos 8/18. HPV: 7/19, 7/14, 8/13HIV: neg 1/19GuidelineInfluenza	Annually				Varicella	Evidence of immunity:PPV23		2 doses 5 y apart; peds doses count			Hx vaccine x 2		3rd dose age 34 					[place] born before [birth year]		8 wks from PCV13					Hx varicella			1 mo from Men-4/Men-B 				Hx varicella zosterPCV13		1 lifetime dose						Ab pos		1 y from PPV23								1 mo from Men-4/Men-B 		Zoster		60+Men-4		2 or 3 doses depending upon vaccine		2 mo apart		1 mo from PPV23/PCV13		HPV		Women thru age 26Men-B		2 doses 2 mo apart		1 mo from PPV23/PCV13				0, 1, 4 moTdap		Every 10 years				HIV		Ab screenHep A		2 doses 6 mo apartHep B		0, 1, 4 moHep C		Ab screen onceDispositionRTC in  4 Doretha Imus, APRN

## 2020-10-13 NOTE — Progress Notes
Review of Systems Constitutional: Negative.  HENT: Negative.  Eyes: Negative.  Respiratory: Negative.  Cardiovascular: Negative.  Gastrointestinal: Negative.  Endocrine: Negative.  Genitourinary: Negative.  Musculoskeletal:      PO Lt femur. Pain 5/10 Skin: Negative.  Allergic/Immunologic: Negative.  Neurological: Negative.  Hematological: Bruises/bleeds easily.

## 2020-10-16 ENCOUNTER — Inpatient Hospital Stay
Admit: 2020-10-16 | Discharge: 2020-10-16 | Payer: BLUE CROSS/BLUE SHIELD | Primary: Student in an Organized Health Care Education/Training Program

## 2020-10-16 ENCOUNTER — Encounter
Admit: 2020-10-16 | Payer: PRIVATE HEALTH INSURANCE | Attending: Student in an Organized Health Care Education/Training Program | Primary: Student in an Organized Health Care Education/Training Program

## 2020-10-16 DIAGNOSIS — Z006 Encounter for examination for normal comparison and control in clinical research program: Secondary | ICD-10-CM

## 2020-10-16 DIAGNOSIS — D571 Sickle-cell disease without crisis: Secondary | ICD-10-CM

## 2020-10-16 LAB — CBC WITH AUTO DIFFERENTIAL
BKR AST/ALT RATIO: 10.2 x1000/??L (ref 4.0–11.0)
BKR OSMOLALITY CALCULATION: 23.2 % — ABNORMAL LOW (ref 38.50–50.00)
BKR WAM ABSOLUTE IMMATURE GRANULOCYTES.: 0.04 x 1000/??L (ref 0.00–0.30)
BKR WAM ABSOLUTE LYMPHOCYTE COUNT.: 2.52 x 1000/??L (ref 0.60–3.70)
BKR WAM ABSOLUTE NRBC (2 DEC): 0.3 x 1000/??L (ref 0.00–1.00)
BKR WAM ANALYZER ANC: 6.37 x 1000/??L (ref 2.00–7.60)
BKR WAM BASOPHIL ABSOLUTE COUNT.: 0.09 x 1000/??L (ref 0.00–1.00)
BKR WAM BASOPHILS: 0.9 % (ref 0.0–1.4)
BKR WAM EOSINOPHIL ABSOLUTE COUNT.: 0.2 x 1000/??L (ref 0.00–1.00)
BKR WAM EOSINOPHILS: 2 % (ref 0.0–5.0)
BKR WAM HEMATOCRIT (2 DEC): 23.2 % — ABNORMAL LOW (ref 38.50–50.00)
BKR WAM HEMOGLOBIN: 8.3 g/dL — ABNORMAL LOW (ref 13.2–17.1)
BKR WAM IMMATURE GRANULOCYTES: 0.4 % (ref 0.0–1.0)
BKR WAM LYMPHOCYTES: 24.6 % (ref 17.0–50.0)
BKR WAM MCH (PG): 35.9 pg — ABNORMAL HIGH (ref 27.0–33.0)
BKR WAM MCHC: 35.8 g/dL (ref 31.0–36.0)
BKR WAM MCV: 100.4 fL — ABNORMAL HIGH (ref 80.0–100.0)
BKR WAM MONOCYTE ABSOLUTE COUNT.: 1.01 x 1000/??L — ABNORMAL HIGH (ref 0.00–1.00)
BKR WAM MONOCYTES: 9.9 % (ref 4.0–12.0)
BKR WAM MPV: 8.4 fL (ref 8.0–12.0)
BKR WAM NEUTROPHILS: 62.2 % (ref 39.0–72.0)
BKR WAM NUCLEATED RED BLOOD CELLS: 3.1 % — ABNORMAL HIGH (ref 0.0–1.0)
BKR WAM PLATELETS: 434 x1000/??L — ABNORMAL HIGH (ref 150–420)
BKR WAM RDW-CV: 18.9 % — ABNORMAL HIGH (ref 11.0–15.0)
BKR WAM RED BLOOD CELL COUNT.: 2.31 M/??L — ABNORMAL LOW (ref 4.00–6.00)
BKR WAM WHITE BLOOD CELL COUNT: 10.2 x1000/??L (ref 4.0–11.0)

## 2020-10-16 LAB — C-REACTIVE PROTEIN     (CRP): BKR C-REACTIVE PROTEIN, HIGH SENSITIVITY: 3.3 mg/L — ABNORMAL HIGH

## 2020-10-21 LAB — FUNGAL CULTURE
BKR FUNGAL CULTURE: NO GROWTH
BKR FUNGAL CULTURE: NO GROWTH
BKR FUNGAL CULTURE: NO GROWTH

## 2020-10-27 ENCOUNTER — Encounter
Admit: 2020-10-27 | Payer: PRIVATE HEALTH INSURANCE | Primary: Student in an Organized Health Care Education/Training Program

## 2020-10-27 ENCOUNTER — Telehealth
Admit: 2020-10-27 | Payer: PRIVATE HEALTH INSURANCE | Attending: Family | Primary: Student in an Organized Health Care Education/Training Program

## 2020-10-27 ENCOUNTER — Encounter
Admit: 2020-10-27 | Payer: PRIVATE HEALTH INSURANCE | Attending: Family | Primary: Student in an Organized Health Care Education/Training Program

## 2020-10-27 MED ORDER — OXYCODONE IMMEDIATE RELEASE 15 MG TABLET
15 mg | ORAL_TABLET | 1 refills | Status: AC
Start: 2020-10-27 — End: 2020-11-20

## 2020-10-27 NOTE — Telephone Encounter
Patient called requesting refill: oxycodone 15 mg

## 2020-11-02 LAB — AFB CULTURE     (BH GH LMW YH)
BKR AFB CULTURE: NO GROWTH
BKR AFB CULTURE: NO GROWTH
BKR AFB STAIN: NONE SEEN
BKR AFB STAIN: NONE SEEN

## 2020-11-10 ENCOUNTER — Encounter
Admit: 2020-11-10 | Payer: BLUE CROSS/BLUE SHIELD | Attending: Orthopaedic Trauma | Primary: Student in an Organized Health Care Education/Training Program

## 2020-11-10 ENCOUNTER — Inpatient Hospital Stay
Admit: 2020-11-10 | Discharge: 2020-11-10 | Payer: BLUE CROSS/BLUE SHIELD | Primary: Student in an Organized Health Care Education/Training Program

## 2020-11-10 DIAGNOSIS — S72362A Displaced segmental fracture of shaft of left femur, initial encounter for closed fracture: Secondary | ICD-10-CM

## 2020-11-10 NOTE — Progress Notes
Review of Systems Constitutional: Negative.  HENT: Negative.  Eyes: Negative.  Respiratory: Negative.  Cardiovascular: Negative.  Gastrointestinal: Negative.  Genitourinary: Negative.  Musculoskeletal:      F/u left leg, soreness  Skin: Negative.  Neurological: Negative.  Endo/Heme/Allergies: Negative.  Psychiatric/Behavioral: Negative.

## 2020-11-10 NOTE — Progress Notes
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  He presents to for evaluation in clinicHe states that his pain is improving with respect to his lower extremity.  He is able to ambulate without the use of assistive devices from time to time.  Denies constitutional symptoms no obvious deformity to his left lower extremityPhysical examination:Surgical incision is well coapted without any obvious signs of infection peri-incisional erythema or drainage.Patient is able to extend and flex his knee.He is able to obtain and maintain full knee extension and is able to flex greater than 90?No obvious coronal plane deformity appreciable sagittal plane deformity appreciable and left thighNo obvious pain with palpation over left femur with no obvious crepitationRadiographs were obtained and independently interpreted of left femur which demonstrate plate fixation in place.  There is no obvious signs of loosening of the fracture screw implants.Plan:Return to clinic in 8 weeks with repeat clinical radiographic evaluationActivity as tolerated weight-bearing and strengthening as tolerated.

## 2020-11-20 ENCOUNTER — Encounter
Admit: 2020-11-20 | Payer: PRIVATE HEALTH INSURANCE | Attending: Pediatric Hematology-Oncology | Primary: Student in an Organized Health Care Education/Training Program

## 2020-11-20 ENCOUNTER — Telehealth
Admit: 2020-11-20 | Payer: PRIVATE HEALTH INSURANCE | Attending: Hematology & Oncology | Primary: Student in an Organized Health Care Education/Training Program

## 2020-11-20 DIAGNOSIS — D571 Sickle-cell disease without crisis: Secondary | ICD-10-CM

## 2020-11-20 MED ORDER — OXYCODONE ER 13.5 MG CAPSULE SPRINKLE EXTEND RELEASE 12HR(DON'T CRUSH)
13.5 mg | ORAL_CAPSULE | Freq: Two times a day (BID) | ORAL | 1 refills | Status: AC
Start: 2020-11-20 — End: 2020-12-22

## 2020-11-20 MED ORDER — OXYCODONE IMMEDIATE RELEASE 15 MG TABLET
15 mg | ORAL_TABLET | 1 refills | Status: AC
Start: 2020-11-20 — End: 2020-12-22

## 2020-11-20 NOTE — Telephone Encounter
Pt of Dr. Su Hilt and Mardene Celeste C. Looking for a refill on his hydroxyurea (HYDREA) 500 mg capsuleoxyCODONE (ROXICODONE) 15 mg Immediate Release tabletoxyCODONE myristate (XTAMPZA ER) 13.5 mg 12 hr extended release sprinkle capsuleCVS Pharmacy [address] [place]

## 2020-11-20 NOTE — Telephone Encounter
I spoke with [facility] and informed him his medications have been refilled. I asked if he was taking more than he usually does. He explained for a while after his surgery he was occasionally taking max dose per day, but has since cut back. Confirmed appt with Nadene Rubins APRN. He appreciated the call.

## 2020-12-07 ENCOUNTER — Ambulatory Visit
Admit: 2020-12-07 | Payer: BLUE CROSS/BLUE SHIELD | Attending: Pediatric Hematology-Oncology | Primary: Student in an Organized Health Care Education/Training Program

## 2020-12-07 DIAGNOSIS — D571 Sickle-cell disease without crisis: Secondary | ICD-10-CM

## 2020-12-07 MED ORDER — IBUPROFEN 600 MG TABLET
600 mg | ORAL_TABLET | 2 refills | Status: AC
Start: 2020-12-07 — End: 2021-03-03

## 2020-12-14 NOTE — Progress Notes
[facility]ult [facility]ick[facility] [phone]/14/2022[name] DOB 1997/08/[id number] CCScheduled return for sickle cell disease (via telemedicine)ProbPatient Active Problem List Diagnosis ? Eustachian tube disorder, bilateral ? Tonsillar and adenoid hypertrophy ? Sickle cell disease, type SS (HC Code) (HC CODE) (HC Code) ? 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 Hx acute chest syndrome (HC Code) ? Hx cholecystectomy ? Closed displaced segmental fracture of shaft of left femur, initial encounter (HC Code) (HC CODE) (HC Code) ? Sickle cell disease without crisis (HC Code) (HC CODE) (HC Code) ? Decreased range of motion (ROM) of left knee ? Decreased strength of lower extremity ? Altered gait ? Sickle cell disease with crisis (HC Code) (HC CODE) (HC Code) ? Pneumonia ? Acute chest syndrome (HC Code) ? 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 Left leg injury MDNo established PMP Brief HistorySickle cell disease (HbSS)Multiple hospitalizations for acute sickle cell painAvascular necrosis of left femoral headHx acute chest syndromeHx blood transfusionOther PMHxSurgical Hx1/15	Cholecystectomy3/16	PE tubes2017	Wisdom teeth removedFMHxMedCurrent Outpatient Medications Medication Sig ? cefuroxime (CEFTIN) 500 mg tablet Take 1 tablet (500 mg total) by mouth every 12 (twelve) hours. (Patient not taking: Reported on 06/22/2020) ? doxycycline hyclate (VIBRA-TABS) 100 mg tablet Take 1 tablet (100 mg total) by mouth every 12 (twelve) hours. (Patient not taking: Reported on 06/22/2020) ? hydroxyurea (HYDREA) 500 mg capsule 3 tab daily for blood. ? ibuprofen (ADVIL,MOTRIN) 600 mg tablet Up to 4 tablets daily as needed for pain. ? naloxone (NARCAN) 4 mg/actuation nasal spray Use 1 spray in 1 nostril for suspected opioid overdose. May repeat in 2 minutes in other nostril with new device if minimal or no response. ? ondansetron (ZOFRAN-ODT) 4 mg disintegrating tablet Take 1 tablet (4 mg total) by mouth every 6 (six) hours as needed for up to 7 doses. (Patient not taking: Reported on 10/13/2020) ? oxyCODONE (ROXICODONE) 15 mg Immediate Release tablet Take 1 to 2 tablets by mouth every 4-6 hours as needed for moderate or severe pain respectively. Max 8 tabs per day. ? oxyCODONE myristate (XTAMPZA ER) 13.5 mg 12 hr extended release sprinkle capsule Take 1 capsule (13.5 mg total) by mouth every 12 (twelve) hours. Long-acting medicine to control pain. 340b. D57.1. For prior authorization Fax [phone]. ? senna (SENOKOT) 8.6 mg tablet Take 2 tablets (17.2 mg total) by mouth every 3 (three) hours as needed for constipation. (Patient not taking: Reported on 10/13/2020) AllergiesAllergies Allergen Reactions ? Nickel Rash  Interval Hx/ROSMitchel is a 25 y.o. male with HbSS presenting for routine follow up. Since his last evaluation he underwent a ROH left femur IM nail, revision ORIF plate fixation, left femoral shaft nonunion on 09/21/20. Since this time he notes minimal issue with pain. He does report soreness in the AM. Takes Xtampza (oxycodone ER) at night and oxy 15 mg 1-2x per day before work. 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 He notes that the job is non strenuous. He is on disease modifying therapy with [facility]. He denies acute concerns. No missed doses  PENo acute distressNo rash to limited examNo facial swelling, asymmetryNormal respiratory effortNormal postureAlert, appropriately responsive to questionsAffect normalInterval Lab/ImagingResults for Phillip Bell (MRN [id number]) as of 10/13/2020 15:24 Ref. Range 08/20/2020 15:32 Sodium Latest Ref Range: 136 - 144 mmol/L 139 Potassium Latest Ref Range: 3.3 - 5.3 mmol/L 4.0 Chloride Latest Ref Range: 98 - 107 mmol/L 106 CO2 Latest Ref Range: 20 - 30 mmol/L 22 Anion Gap Latest Ref Range: 7 -  17  11 BUN Latest Ref Range: 6 - 20 mg/dL 10 Creatinine Latest Ref Range: 0.40 - 1.30 mg/dL 6.04 BUN/Creatinine Ratio Latest Ref Range: 8.0 - 23.0  13.0 eGFR (NON African-American) Latest Ref Range: >60 mL/min/1.93m2 >60 eGFR (Afr Amer) Latest Ref Range: >60 mL/min/1.33m2 >60 Glucose Latest Ref Range: 70 - 100 mg/dL 540 (H) Calcium Latest Ref Range: 8.8 - 10.2 mg/dL 9.2 Total Bilirubin Latest Ref Range: <=1.2 mg/dL 2.0 (H) Bilirubin, Direct Latest Ref Range: <=0.3 mg/dL 0.4 (H) Alkaline Phosphatase Latest Ref Range: 9 - 122 U/L 86 Alanine Aminotransferase (ALT) Latest Ref Range: 9 - 59 U/L 17 Aspartate Aminotransferase (AST) Latest Ref Range: 10 - 35 U/L 37 (H) AST/ALT Ratio Latest Ref Range: See Comment  2.2 Total Protein Latest Ref Range: 6.6 - 8.7 g/dL 7.7 Albumin Latest Ref Range: 3.6 - 4.9 g/dL 4.7 Globulin Latest Ref Range: 2.3 - 3.5 g/dL 3.0 A/G Ratio Latest Ref Range: 1.0 - 2.2  1.6 CRP, High Sensitivity Latest Ref Range: See comment mg/L 4.5 (H) WBC Latest Ref Range: 4.0 - 11.0 x1000/?L 12.9 (H) RBC Latest Ref Range: 4.00 - 6.00 M/?L 2.36 (L) Hemoglobin Latest Ref Range: 13.2 - 17.1 g/dL 8.1 (L) Hematocrit Latest Ref Range: 38.50 - 50.00 % 22.80 (L) MCV Latest Ref Range: 80.0 - 100.0 fL 96.6 MCH Latest Ref Range: 27.0 - 33.0 pg 34.3 (H) MCHC Latest Ref Range: 31.0 - 36.0 g/dL 98.1 RDW-CV Latest Ref Range: 11.0 - 15.0 % 21.4 (H) Platelets Latest Ref Range: 150 - 420 x1000/?L 681 (H) MPV Latest Ref Range: 8.0 - 12.0 fL 8.6 Neutrophils Latest Ref Range: 39.0 - 72.0 % 63.8 Lymphocytes Latest Ref Range: 17.0 - 50.0 % 20.8 Monocytes Latest Ref Range: 4.0 - 12.0 % 10.2 Eosinophils Latest Ref Range: 0.0 - 5.0 % 4.0 Basophils Latest Ref Range: 0.0 - 1.4 % 0.8 Immature Granulocytes Latest Ref Range: 0.0 - 1.0 % 0.4 nRBC Latest Ref Range: 0.0 - 1.0 % 3.0 (H) ANC (Abs Neutrophil Count) Latest Ref Range: 2.00 - 7.60 x 1000/?L 8.20 (H) Absolute Lymphocyte Count Latest Ref Range: 0.60 - 3.70 x 1000/?L 2.67 Monocytes (Absolute) Latest Ref Range: 0.00 - 1.00 x 1000/?L 1.31 (H) Eosinophil Absolute Count Latest Ref Range: 0.00 - 1.00 x 1000/?L 0.52 Basophils Absolute Latest Ref Range: 0.00 - 1.00 x 1000/?L 0.10 Immature Granulocytes (Abs) Latest Ref Range: 0.00 - 0.30 x 1000/?L 0.05 nRBC Absolute Latest Ref Range: 0.00 - 1.00 x 1000/?L 0.40 Retic [place] Pct Latest Ref Range: 0.6 - 2.7 % 13.6 (H) Absolute Reticulocyte Count Latest Ref Range: 0.023 - 0.140 10?6 cells/uL 0.321 (H) Immature Retic Fract Latest Ref Range: 3.0 - 15.9 % 37.1 (H) Reticulocyte Hgb Equivalent Latest Ref Range: 28.2 - 35.7 pg 36.5 (H) Imp/PlanCovid-19?	Discussed 4/20.?	01/16/2020: Vaccinated x 2. ?	07/13/2020:  Eligible for 3rd dose.Sickle cell disease (Hbxx)?	02/18/2019: On hydroxyurea 500 mg 3 tab daily. Reluctant to increase dose. Sent names of 3 new drugs, glutamine, voxelotor, crizanlizumab. ?	03/20/2019: Read about 2 of 3 meds. No interest at this time. ?	01/16/2020?	Hydroxyurea: Taking 3 tab daily.?	Glutamine: not interested?	Voxelotor: no indication?	Crizanlizumab: not interested?	12/07/2020:  Continues hydroxyurea 3 tabs daily. Transfusion Hx?	09/11/2019: Transfusion [place] [facility] [place] [facility] in [place]. Social situation?	Lives: parents in [place]. 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 Interests: hanging out, applying for jobs?	01/16/2020: Continues to live with parents. 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 Landscape architecture internship in [facility] could start when patient  able to walk. Also considering moving to [employer] (college there).  ?	03/18/2020:  Starting a new job in 1 week. ?	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 Discharged on apixaban, apparently stopped it early. 03/18/2020:  In follow up with Ortho. 05/18/2020: Would like to return back to having physical therapy, he will communicate with Orthopedic.07/13/2020:  Has follow up with Ortho tomorrow. 09/14/2020:  Scheduled for surgery on 09/21/2020. Lab work obtained today. Discussed hct goal 28-30 prior to surgery. 09/21/2020:  Revision or ORIF plate; no post operative concerns Intermittent pain?	Typical sickle cell pain?	Oxycodone 10 mg tab and oxycodone ERT 10 mg #60 uses irregularly but rationally for persistent pain crisis. Last dispensed 7/19 according to PMP. ?	02/18/2019: doing well on oxycodone IR 10 mg #60 and oxycodone ERT 10 mg #60 every month. ?	10/09/2019: Continue as above. No request for Rx's today.?	03/18/2020:  Continue as above. Will request prior authorization from PARS department.?	11/15/2021Marlowe Kays and oxycodone continue?	09/14/2020:  Will communicate Ortho regarding a pain management plan.?	12/07/2020: No post operative concerns. Pain well controlled with long acting oxycodone and intermittent use of IR oxy; no acute concerns. Requested refill of ibuprofenMedical marijuana?	Discussed 07/17/17. Certified and accessed thereafter. ?	10/09/2019: Re-certified 3/21.?	01/16/2020: Trying to be clean.Health MaintenanceFYI: 3/10/2021Urine tox screen: OK 07/17/17.PMP: OK 3/10/2021Naloxone: in possession 3/10/2021Urine prot/creat ratio: 0.09 1/19Iron status: 643 7/17IV access: OKCigarettes: noAlcohol: occassionalEye: Sees community MD ophth Dr Luanne Bras: reports q27mVaccine/AntibodiesInfluenza: 10/2019PPV23: 10/15, 3/14PCV13: 7/12Men-4: 7/14, 7/11Men-B: 7/19Tdap: 4/21Hep A: Ab pos 1/19Hep B: sAb neg 1/19.  Dose #1 09/07/17; Dose #2 04/16/18 #3 4/07/2021Hep C: Ab neg 1/19Varicella: Ab pos 8/18. HPV: 7/19, 7/14, 8/13HIV: neg 1/19GuidelineInfluenza	Annually				Varicella	Evidence of immunity:PPV23		2 doses 5 y apart; peds doses count			Hx vaccine x 2		3rd dose age 73 					[place] born before [birth year]		8 wks from PCV13					Hx varicella			1 mo from Men-4/Men-B 				Hx varicella zosterPCV13		1 lifetime dose						Ab pos		1 y from PPV23								1 mo from Men-4/Men-B 		Zoster		60+Men-4		2 or 3 doses depending upon vaccine		2 mo apart		1 mo from PPV23/PCV13		HPV		Women thru age 26Men-B		2 doses 2 mo apart		1 mo from PPV23/PCV13				0, 1, 4 moTdap		Every 10 years				HIV		Ab screenHep A		2 doses 6 mo apartHep B		0, 1, 4 moHep C		Ab screen onceDispositionRTC in 6 weeksVIDEO TELEHEALTH VISIT: This clinician is part of the telehealth program and is conducting this visit in a currently approved location. For this visit the clinician and patient were present via interactive audio & video telecommunications system that permits real-time communications, via the [employer].Patient's use of the telehealth platform followed consent and acknowledges agreement to permit telehealth for this visit. State patient is located in: [place] clinician is appropriately licensed in the above state to provide care for this visit. Other individuals present during the telehealth encounter and their role/relation: noneIf billing based on time, please complete (Not required if billing based on MDM):                           Total time spent in medical video consultation: 25; Total time spent by the provider on the day of service, which includes time spent on chart review, medical video consultation, education, coordination of care/services and counseling 45 mins Because this visit was completed over video, a hands-on physical exam was not performed.  Patient/parent or guardian understands and knows to call back if condition changes.Verdis Prime, MD

## 2020-12-22 ENCOUNTER — Telehealth
Admit: 2020-12-22 | Payer: PRIVATE HEALTH INSURANCE | Attending: Family | Primary: Student in an Organized Health Care Education/Training Program

## 2020-12-22 ENCOUNTER — Encounter
Admit: 2020-12-22 | Payer: BLUE CROSS/BLUE SHIELD | Attending: Orthopaedic Trauma | Primary: Student in an Organized Health Care Education/Training Program

## 2020-12-22 ENCOUNTER — Encounter
Admit: 2020-12-22 | Payer: PRIVATE HEALTH INSURANCE | Attending: Family | Primary: Student in an Organized Health Care Education/Training Program

## 2020-12-22 ENCOUNTER — Inpatient Hospital Stay
Admit: 2020-12-22 | Discharge: 2020-12-22 | Payer: BLUE CROSS/BLUE SHIELD | Primary: Student in an Organized Health Care Education/Training Program

## 2020-12-22 ENCOUNTER — Encounter
Admit: 2020-12-22 | Payer: PRIVATE HEALTH INSURANCE | Attending: Orthopaedic Trauma | Primary: Student in an Organized Health Care Education/Training Program

## 2020-12-22 DIAGNOSIS — D571 Sickle-cell disease without crisis: Secondary | ICD-10-CM

## 2020-12-22 DIAGNOSIS — G8929 Other chronic pain: Secondary | ICD-10-CM

## 2020-12-22 DIAGNOSIS — Z7964 On hydroxyurea therapy: Secondary | ICD-10-CM

## 2020-12-22 DIAGNOSIS — Z23 Encounter for immunization: Secondary | ICD-10-CM

## 2020-12-22 DIAGNOSIS — H902 Conductive hearing loss, unspecified: Secondary | ICD-10-CM

## 2020-12-22 DIAGNOSIS — D7389 Other diseases of spleen: Secondary | ICD-10-CM

## 2020-12-22 DIAGNOSIS — S72362A Displaced segmental fracture of shaft of left femur, initial encounter for closed fracture: Secondary | ICD-10-CM

## 2020-12-22 DIAGNOSIS — D5700 Hb-SS disease with crisis, unspecified: Secondary | ICD-10-CM

## 2020-12-22 DIAGNOSIS — K219 Gastro-esophageal reflux disease without esophagitis: Secondary | ICD-10-CM

## 2020-12-22 DIAGNOSIS — IMO0002 Dactylitis: Secondary | ICD-10-CM

## 2020-12-22 DIAGNOSIS — D6189 Other specified aplastic anemias and other bone marrow failure syndromes: Secondary | ICD-10-CM

## 2020-12-22 DIAGNOSIS — M87052 Idiopathic aseptic necrosis of left femur: Secondary | ICD-10-CM

## 2020-12-22 MED ORDER — HYDROXYUREA 500 MG CAPSULE
500 mg | ORAL_CAPSULE | 3 refills | Status: AC
Start: 2020-12-22 — End: 2021-03-31

## 2020-12-22 MED ORDER — OXYCODONE ER 13.5 MG CAPSULE SPRINKLE EXTEND RELEASE 12HR(DON'T CRUSH)
13.5 mg | ORAL_CAPSULE | Freq: Two times a day (BID) | ORAL | 1 refills | Status: AC
Start: 2020-12-22 — End: 2021-01-25

## 2020-12-22 MED ORDER — OXYCODONE IMMEDIATE RELEASE 15 MG TABLET
15 mg | ORAL_TABLET | 1 refills | Status: AC
Start: 2020-12-22 — End: 2021-05-19

## 2020-12-22 NOTE — Telephone Encounter
I spoke with [facility] and informed him his medications have been refilled. He appreciated the call.

## 2020-12-22 NOTE — Progress Notes
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  (91) days?He states that his pain is improving with respect to his lower extremity.  He is able to ambulate without the use of assistive devices more consistently and takes significantly less pain medication as a few weeks prior.The patient states that his knee pain that he had before his nonunion repair is now gone.  He states that his leg feels lighter.  He occasionally uses 1 crutch just for security but otherwise does not use any assistive devices.   Denies constitutional symptoms no obvious deformity to his left lower extremity?Physical examination:Surgical incision is well coapted without any obvious signs of infection peri-incisional erythema or drainage.?Patient is able to extend and flex his knee.?He is able to obtain and maintain full knee extension and is able to flex greater than 90??No obvious coronal plane deformity appreciable sagittal plane deformity appreciable and left thigh?No obvious pain with palpation over left femur with no obvious crepitation?Radiographs were obtained and independently interpreted of left femur which demonstrate plate fixation in place.  There is no obvious signs of loosening of the fracture screw implants.?Plan:Thirteen weeks status post open treatment with nonunion repair, nonunion repair appears to be heading in the right direction with formation of bone and decreased symptoms.We will have the patient continue to work on strengthening weight-bearing as tolerated and resistive exercises.  Provided he continues to continue to feel well I would like to see him back in 3 months time with repeat radiographs of his left femur at the time of his next visit

## 2020-12-22 NOTE — Telephone Encounter
Patient called requesting hydroxyurea 500mg , oxycodone 15mg , xtampza 13.5 mg [facility] [address] [facility] [place]

## 2020-12-22 NOTE — Progress Notes
Review of Systems Constitutional: Negative.  HENT: Negative.  Eyes: Negative.  Respiratory: Negative.  Cardiovascular: Negative.  Gastrointestinal: Negative.  Endocrine: Negative.  Genitourinary: Negative.  Musculoskeletal:      Open reduction internal fixaton left femur nounion 09/21/2020 P/O Skin: Negative.  Allergic/Immunologic: Negative.  Neurological: Negative.  Hematological: Negative.  Psychiatric/Behavioral: Negative.

## 2021-01-22 ENCOUNTER — Telehealth
Admit: 2021-01-22 | Payer: PRIVATE HEALTH INSURANCE | Attending: Family | Primary: Student in an Organized Health Care Education/Training Program

## 2021-01-22 NOTE — Telephone Encounter
Pt of [employer] Calling in for medication refill on hydroxyurea (HYDREA) 500 mg capsule, oxyCODONE myristate (XTAMPZA ER) 13.5 mg 12 hr extended release and oxyCODONE (ROXICODONE) 15 mg. Pt uses [facility] in [place] on [address]

## 2021-01-22 NOTE — Telephone Encounter
Return call to Patient. Informed him that I will request the refills for ER oxycodone (Xtampza 13.5mg ) and the Oxycodone 15 mg to [facility] [address]. However, the Hydroxyurea has active 2 more active refills. Patient did realize that and verbalized understanding.

## 2021-01-25 ENCOUNTER — Encounter
Admit: 2021-01-25 | Payer: PRIVATE HEALTH INSURANCE | Attending: Family | Primary: Student in an Organized Health Care Education/Training Program

## 2021-01-25 ENCOUNTER — Ambulatory Visit
Admit: 2021-01-25 | Payer: BLUE CROSS/BLUE SHIELD | Attending: Pediatric Hematology-Oncology | Primary: Student in an Organized Health Care Education/Training Program

## 2021-01-25 ENCOUNTER — Encounter
Admit: 2021-01-25 | Payer: PRIVATE HEALTH INSURANCE | Primary: Student in an Organized Health Care Education/Training Program

## 2021-01-25 DIAGNOSIS — D571 Sickle-cell disease without crisis: Secondary | ICD-10-CM

## 2021-01-25 MED ORDER — OXYCODONE IMMEDIATE RELEASE 15 MG TABLET
15 mg | ORAL_TABLET | 1 refills | Status: AC
Start: 2021-01-25 — End: 2021-02-22

## 2021-01-25 MED ORDER — OXYCODONE ER 13.5 MG CAPSULE SPRINKLE EXTEND RELEASE 12HR(DON'T CRUSH)
13.5 mg | ORAL_CAPSULE | Freq: Two times a day (BID) | ORAL | 1 refills | Status: AC
Start: 2021-01-25 — End: 2021-02-22

## 2021-02-18 ENCOUNTER — Encounter
Admit: 2021-02-18 | Payer: PRIVATE HEALTH INSURANCE | Attending: Pediatric Hematology-Oncology | Primary: Student in an Organized Health Care Education/Training Program

## 2021-02-18 ENCOUNTER — Encounter
Admit: 2021-02-18 | Payer: PRIVATE HEALTH INSURANCE | Primary: Student in an Organized Health Care Education/Training Program

## 2021-02-18 DIAGNOSIS — D571 Sickle-cell disease without crisis: Secondary | ICD-10-CM

## 2021-02-22 ENCOUNTER — Encounter
Admit: 2021-02-22 | Payer: PRIVATE HEALTH INSURANCE | Attending: Family | Primary: Student in an Organized Health Care Education/Training Program

## 2021-02-22 ENCOUNTER — Telehealth
Admit: 2021-02-22 | Payer: PRIVATE HEALTH INSURANCE | Attending: Pediatric Hematology-Oncology | Primary: Student in an Organized Health Care Education/Training Program

## 2021-02-22 DIAGNOSIS — D571 Sickle-cell disease without crisis: Secondary | ICD-10-CM

## 2021-02-22 MED ORDER — OXYCODONE IMMEDIATE RELEASE 15 MG TABLET
15 mg | ORAL_TABLET | 1 refills | Status: AC
Start: 2021-02-22 — End: 2021-03-31

## 2021-02-22 MED ORDER — OXYCODONE ER 13.5 MG CAPSULE SPRINKLE EXTEND RELEASE 12HR(DON'T CRUSH)
13.5 mg | ORAL_CAPSULE | Freq: Two times a day (BID) | ORAL | 1 refills | Status: AC
Start: 2021-02-22 — End: 2021-03-31

## 2021-02-22 NOTE — Progress Notes
RC to [place], out of pain meds entirely. Sees he has refills on other medications. Confirmed pharmacy,Informed message will be sent for refills to be sent.Appreciated the [facility]

## 2021-02-22 NOTE — Telephone Encounter
Patient calling in for a medication refill on Oxycodone 15 mg and Xtampza 13.5 mg. Pt uses [facility] [place].

## 2021-03-03 MED ORDER — IBUPROFEN 600 MG TABLET
600 mg | ORAL_TABLET | 2 refills | Status: AC
Start: 2021-03-03 — End: 2021-05-28

## 2021-03-23 ENCOUNTER — Encounter
Admit: 2021-03-23 | Payer: BLUE CROSS/BLUE SHIELD | Attending: Orthopaedic Trauma | Primary: Student in an Organized Health Care Education/Training Program

## 2021-03-23 ENCOUNTER — Inpatient Hospital Stay
Admit: 2021-03-23 | Discharge: 2021-03-23 | Payer: BLUE CROSS/BLUE SHIELD | Primary: Student in an Organized Health Care Education/Training Program

## 2021-03-23 DIAGNOSIS — S72362A Displaced segmental fracture of shaft of left femur, initial encounter for closed fracture: Secondary | ICD-10-CM

## 2021-03-23 NOTE — Progress Notes
Review of Systems Constitutional: Negative.  HENT: Negative.  Eyes: Negative.  Respiratory: Negative.  Cardiovascular: Negative.  Gastrointestinal: Negative.  Genitourinary: Negative.  Musculoskeletal:      F/u left leg little sore had a fall on Saturday  Skin: Negative.  Neurological: Negative.  Psychiatric/Behavioral: Negative.

## 2021-03-23 NOTE — Progress Notes
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  ?He states his pain is significantly improved with respect to his left lower extremity.  He occasionally walks with a caneRecently 1 to a restaurant and was surprised himself by being able to walk without any assistive devices and spend the entire evening without using his crutch.?The patient states that his knee pain that he had before his nonunion repair is now gone.  He states that his leg feels lighter and more stable.?  ?Denies constitutional symptoms no obvious deformity to his left lower extremity?Physical examination:Surgical incision is well coapted without any obvious signs of infection peri-incisional erythema or drainage.?Patient is able to extend and flex his knee.?He is able to obtain and maintain full knee extension and is able to flex greater than 90??No obvious coronal plane deformity appreciable sagittal plane deformity appreciable and left thigh?No obvious pain with palpation over left femur with no obvious crepitation?Radiographs?were obtained and independently interpreted of left femur which demonstrate plate fixation in place.  There appears to be mature bone bridging the fracture site anteriorly however I can still see a persistent medial fracture line.  Plate and screws however do not demonstrate any obvious signs of loosening healing or window wipering.?Plan:Six months status post open treatment with nonunion repair, nonunion repair appears to be heading in the right direction with formation of bone and decreased symptoms.?We will have the patient continue to work on strengthening weight-bearing as tolerated and resistive exercises. He can follow up with [place] on an as-needed basis

## 2021-03-31 ENCOUNTER — Encounter
Admit: 2021-03-31 | Payer: PRIVATE HEALTH INSURANCE | Attending: Family | Primary: Student in an Organized Health Care Education/Training Program

## 2021-03-31 ENCOUNTER — Telehealth
Admit: 2021-03-31 | Payer: PRIVATE HEALTH INSURANCE | Attending: Family | Primary: Student in an Organized Health Care Education/Training Program

## 2021-03-31 DIAGNOSIS — D571 Sickle-cell disease without crisis: Secondary | ICD-10-CM

## 2021-03-31 MED ORDER — OXYCODONE IMMEDIATE RELEASE 15 MG TABLET
15 mg | ORAL_TABLET | 1 refills | Status: AC
Start: 2021-03-31 — End: 2021-04-26

## 2021-03-31 MED ORDER — OXYCODONE ER 13.5 MG CAPSULE SPRINKLE EXTEND RELEASE 12HR(DON'T CRUSH)
13.5 mg | ORAL_CAPSULE | Freq: Two times a day (BID) | ORAL | 1 refills | Status: AC
Start: 2021-03-31 — End: 2021-04-26

## 2021-03-31 MED ORDER — HYDROXYUREA 500 MG CAPSULE
500 mg | ORAL_CAPSULE | 3 refills | Status: AC
Start: 2021-03-31 — End: 2021-06-29

## 2021-03-31 NOTE — Telephone Encounter
Patient called in requesting a refill on the following: Hydroxyurea 500 mg Oxycodone myristate Xtampza 13.5 mg 12 hr extendedOxycodone 15 mg Immediate Release tabletPlease call into [facility] [address]  [place], [place]

## 2021-03-31 NOTE — Telephone Encounter
Noted. Sent to provider for refill

## 2021-04-26 ENCOUNTER — Telehealth
Admit: 2021-04-26 | Payer: PRIVATE HEALTH INSURANCE | Attending: Family | Primary: Student in an Organized Health Care Education/Training Program

## 2021-04-26 ENCOUNTER — Encounter
Admit: 2021-04-26 | Payer: PRIVATE HEALTH INSURANCE | Attending: Family | Primary: Student in an Organized Health Care Education/Training Program

## 2021-04-26 DIAGNOSIS — D571 Sickle-cell disease without crisis: Secondary | ICD-10-CM

## 2021-04-26 MED ORDER — OXYCODONE IMMEDIATE RELEASE 15 MG TABLET
15 mg | ORAL_TABLET | 1 refills | Status: AC
Start: 2021-04-26 — End: 2021-05-12

## 2021-04-26 MED ORDER — OXYCODONE ER 13.5 MG CAPSULE SPRINKLE EXTEND RELEASE 12HR(DON'T CRUSH)
13.5 mg | ORAL_CAPSULE | Freq: Two times a day (BID) | ORAL | 1 refills | Status: AC
Start: 2021-04-26 — End: 2021-05-12

## 2021-04-26 NOTE — Telephone Encounter
Sent to provider to fill. Last fill 03/31/21

## 2021-04-26 NOTE — Telephone Encounter
Patient called requesting medication refill:oxyCODONE (ROXICODONE) 15 mg Immediate Release tabletoxyCODONE myristate (XTAMPZA ER) 13.5 mg 12 hr extended release sprinkle capsuleSend [facility].

## 2021-05-12 ENCOUNTER — Encounter
Admit: 2021-05-12 | Payer: PRIVATE HEALTH INSURANCE | Primary: Student in an Organized Health Care Education/Training Program

## 2021-05-12 ENCOUNTER — Ambulatory Visit
Admit: 2021-05-12 | Payer: BLUE CROSS/BLUE SHIELD | Attending: Family | Primary: Student in an Organized Health Care Education/Training Program

## 2021-05-12 ENCOUNTER — Encounter
Admit: 2021-05-12 | Payer: PRIVATE HEALTH INSURANCE | Attending: Family | Primary: Student in an Organized Health Care Education/Training Program

## 2021-05-12 ENCOUNTER — Inpatient Hospital Stay
Admit: 2021-05-12 | Discharge: 2021-05-12 | Payer: BLUE CROSS/BLUE SHIELD | Primary: Student in an Organized Health Care Education/Training Program

## 2021-05-12 ENCOUNTER — Encounter
Admit: 2021-05-12 | Payer: PRIVATE HEALTH INSURANCE | Attending: Hematology | Primary: Student in an Organized Health Care Education/Training Program

## 2021-05-12 DIAGNOSIS — D571 Sickle-cell disease without crisis: Secondary | ICD-10-CM

## 2021-05-12 DIAGNOSIS — J029 Acute pharyngitis, unspecified: Secondary | ICD-10-CM

## 2021-05-12 DIAGNOSIS — J351 Hypertrophy of tonsils: Secondary | ICD-10-CM

## 2021-05-12 DIAGNOSIS — Z20822 Contact with and (suspected) exposure to covid-19: Secondary | ICD-10-CM

## 2021-05-12 LAB — RETICULOCYTES
BKR ALANINE AMINOTRANSFERASE (ALT): 34 pg (ref 28.2–35.7)
BKR ASPARTATE AMINOTRANSFERASE (AST): 10.2 % — ABNORMAL HIGH (ref 0.6–2.7)
BKR AST/ALT RATIO: 0.222 10??6 cells/uL — ABNORMAL HIGH (ref 0.023–0.140)
BKR WAM IRF: 17.1 % — ABNORMAL HIGH (ref 3.0–15.9)
BKR WAM RETICULOCYTE - ABS (3 DEC): 0.222 10??6 cells/uL — ABNORMAL HIGH (ref 0.023–0.140)
BKR WAM RETICULOCYTE COUNT PCT (1 DEC): 10.2 % — ABNORMAL HIGH (ref 0.6–2.7)
BKR WAM RETICULOCYTE HGB EQUIVALENT: 34 pg (ref 28.2–35.7)

## 2021-05-12 LAB — HETEROPHILE ANTIBODY (MONOSPOT)     (BH GH L LMW YH): BKR HETEROPHILE ANTIBODIES: NEGATIVE

## 2021-05-12 LAB — HIV-1/HIV-2 ANTIBODY/ANTIGEN SCREEN W/REFLEX     (BH GH LMW YH): BKR HIV 1 AND 2 ANTIBODY/HIV-1 ANTIGEN SCREEN: NEGATIVE g/dL (ref 6.6–8.7)

## 2021-05-12 LAB — BASIC METABOLIC PANEL
BKR ANION GAP: 12 g/dL (ref 7–17)
BKR BLOOD UREA NITROGEN: 11 mg/dL (ref 6–20)
BKR BUN / CREAT RATIO: 15.5 (ref 8.0–23.0)
BKR CALCIUM: 9.2 mg/dL (ref 8.8–10.2)
BKR CHLORIDE: 100 mmol/L — ABNORMAL HIGH (ref 98–107)
BKR CO2: 25 mmol/L — ABNORMAL HIGH (ref 20–30)
BKR CREATININE: 0.71 mg/dL (ref 0.40–1.30)
BKR EGFR, CREATININE (CKD-EPI 2021): >60 mL/min/{1.73_m2} (ref >=60)
BKR GLUCOSE: 99 mg/dL (ref 70–100)
BKR PARAINFLUENZA VIRUS 1: >60 mL/min/1.73m2 — ABNORMAL HIGH (ref >=60–12.0)
BKR POTASSIUM: 4.5 mmol/L — ABNORMAL LOW (ref 3.3–5.3)
BKR SODIUM: 137 mmol/L (ref 136–144)

## 2021-05-12 LAB — CBC WITH AUTO DIFFERENTIAL
BKR RESPIRATORY SYNCYTIAL VIRUS: 0.3 % (ref 0.0–1.0)
BKR WAM ABSOLUTE IMMATURE GRANULOCYTES.: 0.1 x 1000/??L (ref 0.00–0.30)
BKR WAM ABSOLUTE LYMPHOCYTE COUNT.: 2.99 x 1000/??L (ref 0.60–3.70)
BKR WAM ABSOLUTE NRBC (2 DEC): 0.06 x 1000/??L — ABNORMAL HIGH (ref 0.00–1.00)
BKR WAM ANALYZER ANC: 11.73 x 1000/??L — ABNORMAL HIGH (ref 2.00–7.60)
BKR WAM BASOPHIL ABSOLUTE COUNT.: 0.07 x 1000/??L (ref 0.00–1.00)
BKR WAM BASOPHILS: 0.4 % (ref 0.0–1.4)
BKR WAM EOSINOPHIL ABSOLUTE COUNT.: 0.5 x 1000/??L (ref 0.00–1.00)
BKR WAM EOSINOPHILS: 2.8 % (ref 0.0–5.0)
BKR WAM HEMATOCRIT (2 DEC): 22.1 % — ABNORMAL LOW (ref 38.50–50.00)
BKR WAM HEMOGLOBIN: 7.8 g/dL — ABNORMAL LOW (ref 13.2–17.1)
BKR WAM IMMATURE GRANULOCYTES: 0.6 % (ref 0.0–1.0)
BKR WAM LYMPHOCYTES: 16.9 % — ABNORMAL LOW (ref 17.0–50.0)
BKR WAM MCH (PG): 35.8 pg — ABNORMAL HIGH (ref 27.0–33.0)
BKR WAM MCHC: 35.3 g/dL (ref 31.0–36.0)
BKR WAM MCV: 101.4 fL — ABNORMAL HIGH (ref 80.0–100.0)
BKR WAM MONOCYTE ABSOLUTE COUNT.: 2.27 x 1000/??L — ABNORMAL HIGH (ref 0.00–1.00)
BKR WAM MONOCYTES: 12.9 % — ABNORMAL HIGH (ref 4.0–12.0)
BKR WAM MPV: 8.9 fL (ref 8.0–12.0)
BKR WAM NEUTROPHILS: 66.4 % (ref 39.0–72.0)
BKR WAM NUCLEATED RED BLOOD CELLS: 0.3 % (ref 0.0–1.0)
BKR WAM PLATELETS: 373 x1000/??L (ref 150–420)
BKR WAM RDW-CV: 15.8 % — ABNORMAL HIGH (ref 11.0–15.0)
BKR WAM RED BLOOD CELL COUNT.: 2.18 M/??L — ABNORMAL LOW (ref 4.00–6.00)
BKR WAM WHITE BLOOD CELL COUNT: 17.7 x1000/??L — ABNORMAL HIGH (ref 4.0–11.0)

## 2021-05-12 LAB — HEPATIC FUNCTION PANEL
BKR A/G RATIO: 1.8 (ref 1.0–2.2)
BKR ALBUMIN: 5 g/dL — ABNORMAL HIGH (ref 3.6–4.9)
BKR ALKALINE PHOSPHATASE: 85 U/L (ref 9–122)
BKR BILIRUBIN DIRECT: 0.4 mg/dL — ABNORMAL HIGH (ref <=0.3)
BKR BILIRUBIN TOTAL: 2.4 mg/dL — ABNORMAL HIGH (ref <=1.2)
BKR GLOBULIN: 2.8 g/dL (ref 2.3–3.5)
BKR PROTEIN TOTAL: 7.8 g/dL (ref 6.6–8.7)

## 2021-05-12 LAB — GROUP A STREPTOCOCCUS BY PCR     (BH GH LMW YH): BKR GROUP A STREP PCR: DETECTED — AB

## 2021-05-12 LAB — PROCALCITONIN     (BH GH LMW Q YH): BKR PROCALCITONIN: 0.16 ng/mL

## 2021-05-12 LAB — FERRITIN: BKR FERRITIN: 1402 ng/mL — ABNORMAL HIGH (ref 30–400)

## 2021-05-12 MED ORDER — OXYCODONE ER 13.5 MG CAPSULE SPRINKLE EXTEND RELEASE 12HR(DON'T CRUSH)
13.5 mg | ORAL_CAPSULE | Freq: Two times a day (BID) | ORAL | 1 refills | Status: SS
Start: 2021-05-12 — End: 2021-05-19

## 2021-05-12 MED ORDER — OXYCODONE IMMEDIATE RELEASE 15 MG TABLET
15 mg | ORAL_TABLET | 1 refills | Status: SS
Start: 2021-05-12 — End: 2021-05-19

## 2021-05-12 MED ORDER — LIDOCAINE MAALOX DIPHEN 1:1:1 MAGIC MOUTHWASH (COMPOUND)
Freq: Four times a day (QID) | ORAL | 1 refills | Status: AC | PRN
Start: 2021-05-12 — End: 2021-07-09

## 2021-05-12 MED ORDER — PENICILLIN V POTASSIUM 250 MG/5 ML ORAL SOLUTION
2505 mg/5 mL | Freq: Two times a day (BID) | ORAL | 1 refills | Status: AC
Start: 2021-05-12 — End: 2021-05-19

## 2021-05-12 NOTE — Progress Notes
SOCIAL WORK NOTEPatient Name: Rayane RiceMedical Record Number: [id number] Date of Birth: Dec 11, 1997Social Work Follow Up  [employer] Most Recent Value Admission Information  Document Type Progress Note Reason for Current Social Work Involvement Psycho-Social Concerns, Resources Mandated Report Required no Visitor Restriction in Place No Intervention Psycho-social Intervention Psycho-social Intervention Clarifying and Identifying Source of Information Patient, Medical Team Medical Team Comment Outpatient Sickle Cell Team Record Reviewed Yes Level of Care Ambulatory Identified Clinical/Disposition, Issues/Barriers: Introductions and psychosocial support Intervention(s)/Summary 20 minutes spent face-to-face with Mr. Errico. I introduced myself as the outpatient sickle cell Child psychotherapist. I explained my role and shared some social work services that are available to him as a patient. I accompanied Nadene Rubins, APRN for this visit. When I asked Mr. Smaldone if he had any psychosocial needs, he reports he needs insurance. He states he will be removed from his mother?s insurance due to turning 25 this month. I asked Mr. 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 I informed Mr. Driggers that he can speak to human resources at his current place of employment to discuss coverage options. If he is unable to get coverage through his employer due to working part time, I suggested Mr. Helmes apply for state insurance. I provided him with the website ([email or web address]). He reports he will apply this month. I provided Mr. Czajka with my contact information and encouraged him to reach out with any other psychosocial concerns. I will remain available as needed. Collaboration with Treatment Team/Community Providers/Family: Nadene Rubins, APRN Outcome Resolved Handoff Required? No Next Steps/Plan (including hand-off): Social work intervention completed at this time. Please consult social work as needed. Provision of Documentation Comment: Not applicable Signature: Coral Spikes, LMSW Contact Information: [phone]

## 2021-05-13 LAB — NEISSERIA GONORRHEA, NAAT     (BH GH L LMW YH): BKR NEISSERIA GONORRHOEAE, DNA PROBE: NEGATIVE

## 2021-05-13 LAB — RESPIRATORY VIRUS PCR PANEL  (YH VERIGENE)(LAB ORDER ONLY)
BKR ADENOVIRUS: NEGATIVE mg/dL — ABNORMAL HIGH (ref 70–100)
BKR HUMAN METAPNEUMOVIRUS (HMPV): NEGATIVE
BKR INFLUENZA A: NEGATIVE % (ref 39.0–72.0)
BKR INFLUENZA B: NEGATIVE
BKR PARAINFLUENZA VIRUS 2: NEGATIVE % (ref 0.0–5.0)
BKR PARAINFLUENZA VIRUS 3: NEGATIVE
BKR PARAINFLUENZA VIRUS 4: NEGATIVE % (ref 0.0–1.0)
BKR RHINOVIRUS: NEGATIVE fL (ref 8.0–12.0)

## 2021-05-13 LAB — SARS COV-2 (COVID-19) RNA- REFERENCE LAB (BH GH LMW Q YH): BKR SARS-COV-2 RNA (COVID-19) (YH): NEGATIVE

## 2021-05-13 LAB — CHLAMYDIA TRACHOMATIS, NAAT     (BH GH LMW YH): BKR CHLAMYDIA, DNA PROBE: NEGATIVE

## 2021-05-17 ENCOUNTER — Emergency Department
Admit: 2021-05-17 | Payer: BLUE CROSS/BLUE SHIELD | Primary: Student in an Organized Health Care Education/Training Program

## 2021-05-17 ENCOUNTER — Inpatient Hospital Stay
Admit: 2021-05-17 | Discharge: 2021-05-19 | Payer: BLUE CROSS/BLUE SHIELD | Source: Home / Self Care | Attending: Internal Medicine

## 2021-05-17 DIAGNOSIS — D5700 Hb-SS disease with crisis, unspecified: Secondary | ICD-10-CM

## 2021-05-17 LAB — CBC WITH AUTO DIFFERENTIAL
BKR WAM ABSOLUTE NRBC (2 DEC): 0.99 x 1000/ÂµL (ref 0.00–1.00)
BKR WAM HEMATOCRIT (2 DEC): 21.2 % — ABNORMAL LOW (ref 38.50–50.00)
BKR WAM HEMOGLOBIN: 7.4 g/dL — ABNORMAL LOW (ref 13.2–17.1)
BKR WAM MCH (PG): 35.9 pg — ABNORMAL HIGH (ref 27.0–33.0)
BKR WAM MCHC: 34.9 g/dL (ref 31.0–36.0)
BKR WAM MCV: 102.9 fL — ABNORMAL HIGH (ref 80.0–100.0)
BKR WAM MPV: 9.4 fL (ref 8.0–12.0)
BKR WAM NUCLEATED RED BLOOD CELLS: 5.6 % — ABNORMAL HIGH (ref 0.0–1.0)
BKR WAM PLATELETS: 639 x1000/??L — ABNORMAL HIGH (ref 150–420)
BKR WAM RDW-CV: 19.2 % — ABNORMAL HIGH (ref 11.0–15.0)
BKR WAM RED BLOOD CELL COUNT.: 2.06 M/ÂµL — ABNORMAL LOW (ref 4.00–6.00)
BKR WAM WHITE BLOOD CELL COUNT: 17.6 x1000/ÂµL — ABNORMAL HIGH (ref 4.0–11.0)

## 2021-05-17 LAB — RETICULOCYTES
BKR WAM IRF: 45.7 % — ABNORMAL HIGH (ref 3.0–15.9)
BKR WAM RETICULOCYTE - ABS (3 DEC): 0.254 10Ë6 cells/uL — ABNORMAL HIGH (ref 0.023–0.140)
BKR WAM RETICULOCYTE COUNT PCT (1 DEC): 12.3 % — ABNORMAL HIGH (ref 0.6–2.7)
BKR WAM RETICULOCYTE HGB EQUIVALENT: 35.9 pg — ABNORMAL HIGH (ref 28.2–35.7)

## 2021-05-17 LAB — COMPREHENSIVE METABOLIC PANEL
BKR A/G RATIO: 1.4 (ref 1.0–2.2)
BKR ALANINE AMINOTRANSFERASE (ALT): 22 U/L (ref 9–59)
BKR ALBUMIN: 4.5 g/dL (ref 3.6–4.9)
BKR ALKALINE PHOSPHATASE: 85 U/L (ref 9–122)
BKR ANION GAP: 13 (ref 7–17)
BKR ASPARTATE AMINOTRANSFERASE (AST): 39 U/L — ABNORMAL HIGH (ref 10–35)
BKR AST/ALT RATIO: 1.8
BKR BILIRUBIN TOTAL: 1.2 mg/dL (ref <=1.2)
BKR BLOOD UREA NITROGEN: 10 mg/dL (ref 6–20)
BKR BUN / CREAT RATIO: 14.1 (ref 8.0–23.0)
BKR CALCIUM: 9.5 mg/dL (ref 8.8–10.2)
BKR CHLORIDE: 103 mmol/L (ref 98–107)
BKR CO2: 23 mmol/L (ref 20–30)
BKR CREATININE: 0.71 mg/dL (ref 0.40–1.30)
BKR EGFR, CREATININE (CKD-EPI 2021): >60 mL/min/{1.73_m2} (ref >=60)
BKR GLOBULIN: 3.3 g/dL (ref 2.3–3.5)
BKR GLUCOSE: 111 mg/dL — ABNORMAL HIGH (ref 70–100)
BKR POTASSIUM: 4.8 mmol/L (ref 3.3–5.3)
BKR PROTEIN TOTAL: 7.8 g/dL (ref 6.6–8.7)
BKR SODIUM: 139 mmol/L (ref 136–144)

## 2021-05-17 LAB — MANUAL DIFFERENTIAL
BKR WAM BASOPHIL - ABS (DIFF) 2 DEC: 0 x 1000/ÂµL (ref 0.00–1.00)
BKR WAM BASOPHILS (DIFF): 0 % (ref 0.0–1.4)
BKR WAM EOSINOPHILS (DIFF) 2 DEC: 0.16 x 1000/ÂµL (ref 0.00–1.00)
BKR WAM EOSINOPHILS (DIFF): 0.9 % (ref 0.0–5.0)
BKR WAM LYMPHOCYTE - ABS (DIFF) 2 DEC: 3.19 x 1000/ÂµL (ref 0.60–3.70)
BKR WAM LYMPHOCYTES (DIFF): 18.1 % (ref 17.0–50.0)
BKR WAM MONOCYTE - ABS (DIFF) 2 DEC: 2.43 x 1000/ÂµL — ABNORMAL HIGH (ref 0.00–1.00)
BKR WAM MONOCYTES (DIFF): 13.8 % — ABNORMAL HIGH (ref 4.0–12.0)
BKR WAM NEUTROPHILS (DIFF): 67.2 % (ref 39.0–72.0)
BKR WAM NEUTROPHILS - ABS (DIFF) 2 DEC: 11.83 x 1000/ÂµL — ABNORMAL HIGH (ref 2.00–7.60)
BKR WAM NUCLEATED RED BLOOD CELLS (DIFF) 1 DEC: 11.2 /100 — ABNORMAL HIGH (ref 0.0–1.0)

## 2021-05-17 LAB — TROPONIN T HIGH SENSITIVITY, 0 HOUR BASELINE WITH REFLEX (BH GH LMW YH): BKR TROPONIN T HS 0 HOUR BASELINE: <6 ng/L

## 2021-05-17 MED ORDER — HYDROMORPHONE 2 MG/ML INJECTION SOLUTION
2 mg/mL | Freq: Once | INTRAVENOUS | Status: CP
Start: 2021-05-17 — End: ?
  Administered 2021-05-17: 21:00:00 2 mL via INTRAVENOUS

## 2021-05-17 MED ORDER — ONDANSETRON HCL (PF) 4 MG/2 ML INJECTION SOLUTION
42 mg/2 mL | Freq: Four times a day (QID) | INTRAVENOUS | Status: DC | PRN
Start: 2021-05-17 — End: 2021-05-20

## 2021-05-17 MED ORDER — HYDROMORPHONE (DILAUDID) PCA 1 MG/ML (50 ML) YNH PYXIS
1 mg/ml | INTRAVENOUS | Status: DC
Start: 2021-05-17 — End: 2021-05-19
  Administered 2021-05-18 – 2021-05-19 (×2): 1 mL via INTRAVENOUS

## 2021-05-17 MED ORDER — KETOROLAC 30 MG/ML (1 ML) INJECTION SOLUTION
30 mg/mL (1 mL) | INTRAVENOUS | Status: DC | PRN
Start: 2021-05-17 — End: 2021-05-18
  Administered 2021-05-18 (×2): 30 mL via INTRAVENOUS

## 2021-05-17 MED ORDER — ENOXAPARIN 40 MG/0.4 ML SUBCUTANEOUS SYRINGE
400.4 mg/0.4 mL | SUBCUTANEOUS | Status: DC
Start: 2021-05-17 — End: 2021-05-18

## 2021-05-17 MED ORDER — ENOXAPARIN 30 MG/0.3 ML SUBCUTANEOUS SYRINGE
300.3 mg/0.3 mL | SUBCUTANEOUS | Status: DC
Start: 2021-05-17 — End: 2021-05-20
  Administered 2021-05-18: 14:00:00 30 mg/0.3 mL via SUBCUTANEOUS

## 2021-05-17 MED ORDER — HYDROMORPHONE 2 MG/ML INJECTION SOLUTION
2 mg/mL | Freq: Once | INTRAVENOUS | Status: CP
Start: 2021-05-17 — End: ?
  Administered 2021-05-17: 22:00:00 2 mL via INTRAVENOUS

## 2021-05-17 MED ORDER — MELATONIN 3 MG TABLET
3 mg | Freq: Every evening | ORAL | Status: DC | PRN
Start: 2021-05-17 — End: 2021-05-20

## 2021-05-17 MED ORDER — ONDANSETRON HCL (PF) 4 MG/2 ML INJECTION SOLUTION
4 mg/2 mL | Freq: Once | INTRAVENOUS | Status: CP
Start: 2021-05-17 — End: ?
  Administered 2021-05-17: 23:00:00 4 mL via INTRAVENOUS

## 2021-05-17 MED ORDER — HYDROMORPHONE 2 MG/ML INJECTION SOLUTION
2 mg/mL | Freq: Once | INTRAVENOUS | Status: CP
Start: 2021-05-17 — End: ?
  Administered 2021-05-18: 01:00:00 2 mL via INTRAVENOUS

## 2021-05-17 MED ORDER — SODIUM CHLORIDE 0.9 % (FLUSH) INJECTION SYRINGE
0.9 % | Freq: Three times a day (TID) | INTRAVENOUS | Status: DC
Start: 2021-05-17 — End: 2021-05-20
  Administered 2021-05-18 – 2021-05-19 (×4): 0.9 mL via INTRAVENOUS

## 2021-05-17 MED ORDER — HYDROXYUREA 500 MG CAPSULE
500 mg | Freq: Every day | ORAL | Status: DC
Start: 2021-05-17 — End: 2021-05-20
  Administered 2021-05-18: 14:00:00 500 mg via ORAL

## 2021-05-17 MED ORDER — KETOROLAC 30 MG/ML (1 ML) INJECTION SOLUTION
30 mg/mL (1 mL) | Freq: Once | INTRAVENOUS | Status: CP
Start: 2021-05-17 — End: ?
  Administered 2021-05-17: 21:00:00 30 mL via INTRAVENOUS

## 2021-05-17 MED ORDER — SODIUM CHLORIDE 0.9 % (FLUSH) INJECTION SYRINGE
0.9 % | INTRAVENOUS | Status: DC | PRN
Start: 2021-05-17 — End: 2021-05-20

## 2021-05-17 MED ORDER — DEXTROSE 5 % AND 0.45 % SODIUM CHLORIDE INTRAVENOUS SOLUTION
INTRAVENOUS | Status: DC
Start: 2021-05-17 — End: 2021-05-19
  Administered 2021-05-18 – 2021-05-19 (×4): 1000.000 mL/h via INTRAVENOUS

## 2021-05-17 MED ORDER — POLYETHYLENE GLYCOL 3350 17 GRAM ORAL POWDER PACKET
17 gram | Freq: Two times a day (BID) | ORAL | Status: DC
Start: 2021-05-17 — End: 2021-05-20

## 2021-05-17 MED ORDER — METOCLOPRAMIDE 5 MG/ML INJECTION SOLUTION
5 mg/mL | Freq: Four times a day (QID) | INTRAVENOUS | Status: DC | PRN
Start: 2021-05-17 — End: 2021-05-20

## 2021-05-17 MED ORDER — ACETAMINOPHEN 325 MG TABLET
325 mg | Freq: Four times a day (QID) | ORAL | Status: DC | PRN
Start: 2021-05-17 — End: 2021-05-19

## 2021-05-17 MED ORDER — SENNOSIDES 8.6 MG TABLET
8.6 mg | Freq: Two times a day (BID) | ORAL | Status: DC
Start: 2021-05-17 — End: 2021-05-20
  Administered 2021-05-19: 14:00:00 8.6 mg via ORAL

## 2021-05-17 MED ORDER — DIPHENHYDRAMINE 25 MG CAPSULE
25 mg | ORAL | Status: DC | PRN
Start: 2021-05-17 — End: 2021-05-20

## 2021-05-17 MED ORDER — LIDOCAINE 5 % ADHESIVE PATCH
5 % | TRANSDERMAL | Status: DC
Start: 2021-05-17 — End: 2021-05-20

## 2021-05-17 NOTE — Utilization Review (ED)
UM Status: UR/MD agree on IP status Dr. Francine Graven to medicine for sickle cell crisis.  Placed on a dilaudid pca pump.

## 2021-05-18 ENCOUNTER — Telehealth
Admit: 2021-05-18 | Payer: PRIVATE HEALTH INSURANCE | Attending: Family | Primary: Student in an Organized Health Care Education/Training Program

## 2021-05-18 ENCOUNTER — Telehealth
Admit: 2021-05-18 | Payer: PRIVATE HEALTH INSURANCE | Attending: Medical Oncology | Primary: Student in an Organized Health Care Education/Training Program

## 2021-05-18 LAB — BASIC METABOLIC PANEL
BKR ANION GAP: 11 (ref 7–17)
BKR BLOOD UREA NITROGEN: 12 mg/dL (ref 6–20)
BKR BUN / CREAT RATIO: 17.9 (ref 8.0–23.0)
BKR CALCIUM: 9.5 mg/dL (ref 8.8–10.2)
BKR CHLORIDE: 102 mmol/L (ref 98–107)
BKR CO2: 24 mmol/L (ref 20–30)
BKR CREATININE: 0.67 mg/dL (ref 0.40–1.30)
BKR EGFR, CREATININE (CKD-EPI 2021): >60 mL/min/{1.73_m2} (ref >=60)
BKR GLUCOSE: 128 mg/dL — ABNORMAL HIGH (ref 70–100)
BKR POTASSIUM: 4.5 mmol/L (ref 3.3–5.3)
BKR SODIUM: 137 mmol/L (ref 136–144)

## 2021-05-18 LAB — SARS COV-2 (COVID-19) RNA: BKR SARS-COV-2 RNA (COVID-19) (YH): NEGATIVE

## 2021-05-18 LAB — CBC WITH AUTO DIFFERENTIAL
BKR WAM ABSOLUTE NRBC (2 DEC): 1.79 x 1000/ÂµL — ABNORMAL HIGH (ref 0.00–1.00)
BKR WAM HEMATOCRIT (2 DEC): 18.5 % — ABNORMAL LOW (ref 38.50–50.00)
BKR WAM HEMOGLOBIN: 6.6 g/dL — ABNORMAL LOW (ref 13.2–17.1)
BKR WAM MCH (PG): 36.7 pg — ABNORMAL HIGH (ref 27.0–33.0)
BKR WAM MCHC: 35.7 g/dL (ref 31.0–36.0)
BKR WAM MCV: 102.8 fL — ABNORMAL HIGH (ref 80.0–100.0)
BKR WAM MPV: 9.2 fL (ref 8.0–12.0)
BKR WAM NUCLEATED RED BLOOD CELLS: 6.4 % — ABNORMAL HIGH (ref 0.0–1.0)
BKR WAM PLATELETS: 444 x1000/ÂµL — ABNORMAL HIGH (ref 150–420)
BKR WAM RDW-CV: 19.3 % — ABNORMAL HIGH (ref 11.0–15.0)
BKR WAM RED BLOOD CELL COUNT.: 1.8 M/ÂµL — ABNORMAL LOW (ref 4.00–6.00)
BKR WAM WHITE BLOOD CELL COUNT: 28 x1000/ÂµL — ABNORMAL HIGH (ref 4.0–11.0)

## 2021-05-18 LAB — MANUAL DIFFERENTIAL
BKR WAM BANDS (DIFF) (1 DEC): 2 % (ref 0.0–10.0)
BKR WAM BASOPHIL - ABS (DIFF) 2 DEC: 0 x 1000/ÂµL (ref 0.00–1.00)
BKR WAM BASOPHILS (DIFF): 0 % (ref 0.0–1.4)
BKR WAM EOSINOPHILS (DIFF) 2 DEC: 0 x 1000/ÂµL (ref 0.00–1.00)
BKR WAM EOSINOPHILS (DIFF): 0 % (ref 0.0–5.0)
BKR WAM LYMPHOCYTE - ABS (DIFF) 2 DEC: 2.24 x 1000/ÂµL (ref 0.60–3.70)
BKR WAM LYMPHOCYTES (DIFF): 8 % — ABNORMAL LOW (ref 17.0–50.0)
BKR WAM METAMYELOCYTES (DIFF) 1 DEC: 1 % (ref 0.0–1.0)
BKR WAM MONOCYTE - ABS (DIFF) 2 DEC: 1.96 x 1000/ÂµL — ABNORMAL HIGH (ref 0.00–1.00)
BKR WAM MONOCYTES (DIFF): 7 % (ref 4.0–12.0)
BKR WAM MYELOCYTES (DIFF) 1 DEC: 3 % — ABNORMAL HIGH (ref 0.0–0.0)
BKR WAM NEUTROPHILS (DIFF): 79 % — ABNORMAL HIGH (ref 39.0–72.0)
BKR WAM NEUTROPHILS - ABS (DIFF) 2 DEC: 22.68 x 1000/ÂµL — ABNORMAL HIGH (ref 2.00–7.60)

## 2021-05-18 LAB — URINALYSIS WITH CULTURE REFLEX      (BH LMW YH)
BKR BILIRUBIN, UA: NEGATIVE
BKR GLUCOSE, UA: NEGATIVE
BKR KETONES, UA: NEGATIVE
BKR NITRITE, UA: NEGATIVE
BKR PH, UA: 5.5 (ref 5.5–7.5)
BKR SPECIFIC GRAVITY, UA: 1.016 (ref 1.005–1.030)
BKR UROBILINOGEN, UA (MG/DL): <2 mg/dL (ref <=2.0)

## 2021-05-18 LAB — INFLUENZA TYPE A/B RNA     (BH GH LMW Q YH)
BKR INFLUENZA A: NEGATIVE
BKR INFLUENZA B: NEGATIVE

## 2021-05-18 LAB — TROPONIN T HIGH SENSITIVITY, 1 HOUR WITH REFLEX (BH GH LMW YH)
BKR TROPONIN T HS 1 HOUR DELTA FROM 0 HOUR: 0 ng/L
BKR TROPONIN T HS 1 HOUR: <6 ng/L

## 2021-05-18 LAB — PROCALCITONIN     (BH GH LMW Q YH): BKR PROCALCITONIN: 0.08 ng/mL

## 2021-05-18 LAB — UA REFLEX CULTURE

## 2021-05-18 LAB — URINE MICROSCOPIC     (BH GH LMW YH)
BKR RBC/HPF INSTRUMENT: 4 /HPF — ABNORMAL HIGH (ref 0–2)
BKR URINE SQUAMOUS EPITHELIAL CELLS, UA (NUMERIC): 1 /HPF (ref 0–5)
BKR WBC/HPF INSTRUMENT: 23 /HPF — ABNORMAL HIGH (ref 0–5)

## 2021-05-18 LAB — PHOSPHORUS     (BH GH L LMW YH): BKR PHOSPHORUS: 3.8 mg/dL (ref 2.2–4.5)

## 2021-05-18 LAB — MAGNESIUM: BKR MAGNESIUM: 1.8 mg/dL (ref 1.7–2.4)

## 2021-05-18 MED ORDER — OXYCODONE IMMEDIATE RELEASE 15 MG TABLET
15 mg | ORAL | Status: DC | PRN
Start: 2021-05-18 — End: 2021-05-19

## 2021-05-18 MED ORDER — OXYCODONE IMMEDIATE RELEASE 15 MG TABLET
15 mg | ORAL | Status: DC | PRN
Start: 2021-05-18 — End: 2021-05-18
  Administered 2021-05-18: 19:00:00 15 mg via ORAL

## 2021-05-18 MED ORDER — KETOROLAC 30 MG/ML (1 ML) INJECTION SOLUTION
30 mg/mL (1 mL) | Freq: Four times a day (QID) | INTRAVENOUS | Status: DC
Start: 2021-05-18 — End: 2021-05-20
  Administered 2021-05-18 – 2021-05-19 (×4): 30 mL via INTRAVENOUS

## 2021-05-18 MED ORDER — PENICILLIN V POTASSIUM 500 MG TABLET
500 mg | Freq: Four times a day (QID) | ORAL | Status: DC
Start: 2021-05-18 — End: 2021-05-20
  Administered 2021-05-18 – 2021-05-19 (×5): 500 mg via ORAL

## 2021-05-18 MED ORDER — PENICILLIN V POTASSIUM 250 MG/5 ML ORAL SOLUTION
250 mg/5 mL | Freq: Two times a day (BID) | ORAL | Status: DC
Start: 2021-05-18 — End: 2021-05-18

## 2021-05-18 MED ORDER — OXYCODONE ER 15 MG TABLET,CRUSH RESISTANT,EXTENDED RELEASE 12 HR
15 mg | Freq: Two times a day (BID) | ORAL | Status: DC
Start: 2021-05-18 — End: 2021-05-20
  Administered 2021-05-19 (×2): 15 mg via ORAL

## 2021-05-18 MED ORDER — OXYCODONE IMMEDIATE RELEASE 30 MG TABLET
30 mg | ORAL | Status: DC | PRN
Start: 2021-05-18 — End: 2021-05-19
  Administered 2021-05-18 – 2021-05-19 (×4): 30 mg via ORAL

## 2021-05-18 MED ORDER — PENICILLIN V POTASSIUM 250 MG/5 ML ORAL SOLUTION
250 mg/5 mL | Freq: Four times a day (QID) | ORAL | Status: DC
Start: 2021-05-18 — End: 2021-05-18

## 2021-05-18 MED ORDER — ZZ IMS TEMPLATE
ORAL | Status: DC | PRN
Start: 2021-05-18 — End: 2021-05-19
  Administered 2021-05-19 (×2): 5 mg via ORAL

## 2021-05-18 NOTE — ED Notes
4:33 PM 25 yo M presents to [facility] c/o sickle cell pain that started this AM. Pt reports pain mostly in his back radiating down his legs. Pt stating he took his ibuprofen and oxycodone as prescribed with no relief. Pt appears uncomfortable. Pt restless in stretcher. Pt stating he usually does not have to come to hospital for [facility], last time was awhile ago. Chief Complaint Patient presents with ? Sickle Cell Pain   hx sickle cell. having a SCC. pt c/o back and leg pain that started this morning. pt did take his home oxycodone and ibuprofen with no relief.  4:23 PM PIV placed, labs sent. Meds given per MAR.4:40 PM pt to CXR. Plan to obtain rest of blood work and meds once pt returns.7:02 PM Pt resting in stretcher. Pt stating I do not want my third dose of dilaudid and I do not want to start the PCA pump yet because my stomach hurts despite zofran given. Crackers provided. Pending POC,

## 2021-05-18 NOTE — Other
-  CONSULT  REQUEST  DOC[facility] NOTE-Type of consult: [facility] Hematology -New Consult: [name] Phillip Bell /Location: B03 HALL/B03 HALL / Brief Clinical Question: Sickle crisis/Callback Cell Phone: [phone] / Please confirm receipt of this message by texting back ?OK?-1 - Mobile Heartbeat message sent to [name] at 11:19 AM. Received response at 1119.-[name]11/15/202211:19 [facility] [phone]

## 2021-05-18 NOTE — Telephone Encounter
Will route to providers for input

## 2021-05-18 NOTE — Telephone Encounter
Patient called stating that he has been hospitilez since yesterday for sickle cell and he is not receiving adequate treatment.He stated that hem provider came to see him earlier but hasn't heard anything else.He is requesting to speak to [facility] or Dr. Vela Prose.

## 2021-05-18 NOTE — ED Notes
7:10 AM - Flint Melter, RN Report received, care assumed. To ED sickle cell crisis; dilaudid PCA. Pain consult this AM. A/Ox4, ambulatory. Past Medical History: Diagnosis Date ? Aplastic crisis (HC Code) (HC CODE) (HC Code) 6/6/ 2005 transfusion ? Avascular necrosis of femur head, left (HC Code) (HC CODE) (HC Code)  ? Chronic pain   sickle cell ? Conductive hearing loss 09/22/14 P.E tubes placed ? Dactylitis one episode  April, 1998 ? GERD (gastroesophageal reflux disease)  ? 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 On hydroxyurea therapy started October 2013 ? Pneumococcal vaccination given 01/20/2006 and 09/28/2012 ? 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 Vasoocclusive sickle cell crisis (HC Code) (HC CODE) (HC Code) Adm 01/02/2012, ER 04/08/12, 4/1-10/03/12, 7/2-01/05/13, 12/02/13-12/03/13 , March 2016 - 2 admissions 10:00 AMAmbulatory to bathroom independently; returned to stretcher safely. 12:30 PMPT restting comfortably on stretcher, NAD. Pending admit bed. 3:47 PMMedicated per MAR. Tolerating PO diet well, now resting comfortably.Floor Handoff Telemetry: 	[]  Yes		[x]  NoCode Status:   [x]  Full		[]  DNR		[]  DNI		Other (specify):Safety Precautions: []  None	[]  Sitter   []  Restraints	[]  Suicidal	[x]  Fall Risk	Other (specify):Mentation/Orientation:	 A&O (Self, person, place, time) x   4       	 Disoriented to:                    	 Deficits: []  Hearing impaired	[]  Blind  []  Nonverbal	 []  Mental retardationOxygenation Upon Admission: [x]  RA	[]  NC	[]  Venti  []  Simple Mask []  Other	Baseline O2 Status? [x]  Yes	[]  NoAmbulation: [x]  Independent	[]  Cane   []  Walker	[]  Wheelchair	[]  Bedbound		[]  Hemiplegic	[]  Paraplegic	[]  QuadraplegicEliminiation: [x]  Independent	[]  Commode	[]  Bedpan/Urinal  []  Straight Cath []  Foley cath			[]  Urostomy	[]  Colostomy	Other (specify):Diarrhea/Loose stool : []  1x within 24h  []  2x within 24h  []  3x within 24h  []  None 	C.Diff Order: 	[]  Ordered- needs to be collected             []  Collected-sent to lab             []  Resulted - Negative C.Diff             []  Resulted - Positive C.Diff[]  Not Ordered   [x]  N/ASkin Alteration: []  Pressure Injury []  Wound [x]  None []  Skin not assessedDiet: [x]  Regular/No order placed	[]  NPO		Other (specify):IV Access: [x]  PIV   []  PICC    []  Port    [] Central line    []  A-line    Other (specify)IVF/GTT Running Upon ED Departure? []  No	    [x]  Yes (specify): Dilaudid PCA & D51/2NS @ 100Outstanding Meds/Treatments/Tests: Patient Belongings: With PT Are the belongings documented?          [x]  No	    []  YesIs someone taking belongings home?   [x]  No     []  Yes  Who? (specify)                                   ED RN and Contact number/MHB #:        Flint Melter, RN [phone]

## 2021-05-18 NOTE — H&P
[facility], [place]	 [facility]	Internal Medicine Hospitalist Attending History & Physical Admission Note Hospitalist Attending: Elesa Hacker, MD - pager [phone], cell phone/iphone5: [phone] (6pm-7am shift), Mobile Heartbeat: [phone] seen and examined by me on:11/14/20229:08 PMHistory provided by: the patientHistory limited by: no limitationsPCP: Delphia Grates (Inactive)Subjective: Chief Complaint: back/leg pains x 1 day (c/w usual sickle cell pain crisis)HPI: 25 y/o male w h/o sickle cell disease (Hgb SS) w h/o acute chest syndrome, dactylitis, and avascular necrosis of left femoral head who now p/w severe pain in back/legs x 1 day - c/w usual sickle cell pain crisis - without relief of home oxycodone/nsaids. Pt w associated subjective fevers/chills, and atypical chest pains.  No sob/doe, no abd pain, No other c/o.In ED pt afebrile and and admitted for sickle cell pain crisis mgmt. FYI plan:    25 y/o male w h/o sickle cell disease (HbSS) and occasional pain crisis.      Transfuse for Hct < 15 or symptoms such as increase in shortness of breath or easy fatigue (pain is not a symptom for which we transfuse).      For pain in the ED recommend ketorolac 15 mg IV x 1 and hydromorphone 1 mg IV/SC q30 min x 3 and then evaluate for discharge or admission. If plan for admission, recommend PCA hydromorphone 0.5 q10 min 1 h lock out 3 mg. Continue ketorolac 15 mg IV x total 20 doses. Offer lidocaine patches, hot/cold packs, APAP. Pruritis, nausea uncommon in this patient. ED course:  Dilaudid 1mg  iv x 1, Toradol 15mg  iv x 1, zofran 4mg  iv x History: PMH PSH Past Medical History: Diagnosis Date ? Aplastic crisis (HC Code) (HC CODE) (HC Code) 6/6/ 2005 transfusion ? Avascular necrosis of femur head, left (HC Code) (HC CODE) (HC Code)  ? Chronic pain   sickle cell ? Conductive hearing loss 09/22/14 P.E tubes placed ? Dactylitis one episode  April, 1998 ? GERD (gastroesophageal reflux disease)  ? 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 On hydroxyurea therapy started October 2013 ? Pneumococcal vaccination given 01/20/2006 and 09/28/2012 ? 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 Vasoocclusive sickle cell crisis (HC Code) (HC CODE) (HC Code) Adm 01/02/2012, ER 04/08/12, 4/1-10/03/12, 7/2-01/05/13, 12/02/13-12/03/13 , March 2016 - 2 admissions  Past Surgical History: Procedure Laterality Date ? CHOLECYSTECTOMY, LAPAROSCOPIC  1/15 ? TYMPANOSTOMY TUBE PLACEMENT  09/22/2014  by Dr. Bernita Raisin  Social History Family History Social History Socioeconomic History ? Marital status: Single   Spouse name: Not on file ? Number of children: Not on file ? Years of education: Not on file ? Highest education level: Not on file Occupational History ? Occupation: Soil scientist Tobacco Use ? Smoking status: Never Smoker ? Smokeless tobacco: Never Used Substance and Sexual Activity ? Alcohol use: Yes   Comment: beer and liquor on occasion ? Drug use: Not Currently   Types: Marijuana   Comment: last used on Tuesday ? Sexual activity: Yes Other Topics Concern ? Not on file Social History Narrative  Lives in [place].    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  Will get a Masters, Set designer in business  [employer] Social Determinants of Conservation officer, historic buildings Strain: Not on [employer] Insecurity: Not on file Transportation Needs: Not on file Physical Activity: Not  on file Stress: Not on file Social Connections: Not on file Intimate Partner Violence: Not on file Housing Stability: Not on file  Family History Problem Relation Age of Onset ? Sickle cell trait Mother  ? Thyroid disease Mother  ? Sickle cell trait Father  ? Sarcoidosis Father  ? Diabetes Paternal Grandfather  ? Hypertension Paternal Grandfather  ? Alcohol abuse Maternal Aunt   Prior to Admission Medications (Not in a hospital admission)Reviewed Prior to Admission MedicationsMedication Details Provider Last Reconciliation Status hydroxyurea (HYDREA) 500 mg capsule 3 tab daily for blood. Forestine Na, MD Reordered Ordered as: ?hydroxyurea (HYDREA) capsule 1,500 mg - 1,500 mg, Oral, Daily, First dose on Tue 05/18/21 at 0900 Chemotherapy: Swallow whole, do not crush or open. CYTOTOXIC AGENT OP SIG:3 tab daily for blood. ?  ibuprofen (ADVIL,MOTRIN) 600 mg tablet Up to 4 tablets daily as needed for pain. Forestine Na, MD Not Ordered lidocaine maalox diphenhydramine (MAGIC MOUTHWASH) 1:1:1 Swish and spit 5 mLs 4 (four) times daily as needed. Forestine Na, MD Not Ordered naloxone [facility]) 4 mg/actuation nasal spray Use 1 spray in 1 nostril for suspected opioid overdose. May repeat in 2 minutes in other nostril with new device if minimal or no response. Forestine Na, MD Not Ordered  Patient not taking: No sig reported   ondansetron (ZOFRAN-ODT) 4 mg disintegrating tablet Take 1 tablet (4 mg total) by mouth every 6 (six) hours as needed for up to 7 doses. Forestine Na, MD Not Ordered  Patient not taking: No sig reported   oxyCODONE (ROXICODONE) 15 mg Immediate Release tablet Take 1 to 2 tablets by mouth every 4-6 hours as needed for moderate or severe pain respectively. Max 8 tabs per day. Forestine Na, MD Not Ordered oxyCODONE (ROXICODONE) 15 mg Immediate Release tablet 1 - 3 tablets every 3 h as needed for pain. Forestine Na, MD Not Ordered oxyCODONE myristate [facility]) 13.5 mg 12 hr extended release sprinkle capsule Take 1 capsule (13.5 mg total) by mouth every 12 (twelve) hours. Long-acting medicine to control pain. 340b. D57.1. For prior authorization Fax [phone]. Forestine Na, MD Not Ordered penicillin VK (VEETID) 250 mg/5 mL suspension Take 10 mLs (500 mg total) by mouth 2 (two) times daily for 10 days. Forestine Na, MD Not Ordered senna (SENOKOT) 8.6 mg tablet Take 2 tablets (17.2 mg total) by mouth every 3 (three) hours as needed for constipation. Forestine Na, MD Not Ordered  Patient not taking: No sig reported    Outpatient medication list obtained from patient and medication reconciliation was performed by me and Pharm TechAllergies Allergies Allergen Reactions ? Nickel Rash   Review of Systems: 10 Point Review of Systems: Constitutional: Positive for subjective for fever and chills. Negative for diaphoresis, activity change, appetite change, fatigue and unexpected weight change. HENT: Negative for congestion and rhinorrhea. Respiratory: Negative for apnea, cough, choking, chest tightness, shortness of breath and wheezing. Cardiovascular: Positive for chest pain, Negative palpitations and leg swelling. Gastrointestinal: Negative for nausea and vomiting. Negative for abdominal pain, diarrhea, constipation and abdominal distention. Genitourinary: Negative for dysuria and flank pain. Negative for urgency, frequency, hematuria and difficulty urinating. Musculoskeletal: positive for low for back pain. Negative for myalgias, joint swelling, arthralgias, Positive for back/leg pains and gait problem due to pain. Negative neck pain. Skin: Negative for pallor and rash. Neurological: Negative for dizziness, tremors, seizures, syncope, weakness, light-headedness, numbness and headaches. Psychiatric/Behavioral: Negative for hallucinations, confusion, self-injury and agitation. The patient  is not nervous/anxious. ROS is otherwise NEGATIVEObjective: Vitals: reviewed by me: Current Vital Signs:BP 139/72  - Pulse 60  - Temp 98 ?F (36.7 ?C) (Oral)  - Resp 15  - SpO2 99% 24 hour range: Last 24 hours: Temp:  [97.8 ?F (36.6 ?C)-98 ?F (36.7 ?C)] 98 ?F (36.7 ?C)Pulse:  [60-63] 60Resp:  [15-20] 15BP: (139-143)/(70-72) 139/72SpO2:  [99 %-100 %] 99 %Physical Exam: Constitutional: vitals as above, in moderate discomfort due to pain A&O x 3Eyes, ENMT: nc/at, perrl, nares and o/p clear w mmmHeme/lymph/neck: supple, no lymph, no  Jvd, no thyromegalyCardiovascular: rrr, no mPulmonary/Respiratory: CTA BGastrointestinal: +bs, soft/nt/nd, no r/g, no peritoneal signs, no hsmVascular: wwp, good pulses, no edemaIntegument: no rashesNeurological: a&o x 3, no gross motor deficitsPertinent Labs/Imaging/Diagnostics: Labs: in past 24 hours reviewed by BJ:YNWG 24 hours: Recent Results (from the past 24 hour(s)) CBC auto differential  Collection Time: 05/17/21  4:25 PM Result Value Ref Range  WBC 17.6 (H) 4.0 - 11.0 x1000/?L  RBC 2.06 (L) 4.00 - 6.00 M/?L  Hemoglobin 7.4 (L) 13.2 - 17.1 g/dL  Hematocrit 95.62 (L) 13.08 - 50.00 %  MCV 102.9 (H) 80.0 - 100.0 fL  MCH 35.9 (H) 27.0 - 33.0 pg  MCHC 34.9 31.0 - 36.0 g/dL  RDW-CV 65.7 (H) 84.6 - 15.0 %  Platelets 639 (H) 150 - 420 x1000/?L  MPV 9.4 8.0 - 12.0 fL  nRBC 5.6 (H) 0.0 - 1.0 %  Absolute nRBC 0.99 0.00 - 1.00 x 1000/?L Type and screen  Collection Time: 05/17/21  4:25 PM Result Value Ref Range  ABO Grouping O   Rh Type POS   Antibody Screen NEG  Comprehensive metabolic panel  Collection Time: 05/17/21  4:25 PM Result Value Ref Range  Sodium 139 136 - 144 mmol/L  Potassium 4.8 3.3 - 5.3 mmol/L  Chloride 103 98 - 107 mmol/L  CO2 23 20 - 30 mmol/L  Anion Gap 13 7 - 17  Glucose 111 (H) 70 - 100 mg/dL  BUN 10 6 - 20 mg/dL  Creatinine 9.62 9.52 - 1.30 mg/dL  Calcium 9.5 8.8 - 84.1 mg/dL  BUN/Creatinine Ratio 32.4 8.0 - 23.0  Total Protein 7.8 6.6 - 8.7 g/dL  Albumin 4.5 3.6 - 4.9 g/dL  Total Bilirubin 1.2 <=4.0 mg/dL  Alkaline Phosphatase 85 9 - 122 U/L  Alanine Aminotransferase (ALT) 22 9 - 59 U/L  Aspartate Aminotransferase (AST) 39 (H) 10 - 35 U/L  Globulin 3.3 2.3 - 3.5 g/dL  A/G Ratio 1.4 1.0 - 2.2  AST/ALT Ratio 1.8 See Comment  eGFR (Creatinine) >60 >=60 mL/min/1.66m2 Reticulocytes  Collection Time: 05/17/21  4:25 PM Result Value Ref Range  Immature Reticulocyte Fraction 45.7 (H) 3.0 - 15.9 %  Reticulocyte Hgb Equivalent 35.9 (H) 28.2 - 35.7 pg  Percent Reticulocyte 12.3 (H) 0.6 - 2.7 %  Absolute Reticulocyte 0.254 (H) 0.023 - 0.140 10?6 cells/uL Manual Differential  Collection Time: 05/17/21  4:25 PM Result Value Ref Range  Neutrophils 67.2 39.0 - 72.0 %  Lymphocytes 18.1 17.0 - 50.0 %  Monocytes 13.8 (H) 4.0 - 12.0 %  Eosinophils 0.9 0.0 - 5.0 %  Basophil 0.0 0.0 - 1.4 %  Nucleated Red Blood Cells 11.2 (H) 0.0 - 1.0 /100  Neutrophils Absolute 11.83 (H) 2.00 - 7.60 x 1000/?L  Lymphocyte Absolute 3.19 0.60 - 3.70 x 1000/?L  Monocyte Absolute Count 2.43 (H) 0.00 - 1.00 x 1000/?L  Eosinophil Absolute Count 0.16 0.00 - 1.00 x 1000/?L  Basophil Absolute Count 0.00 0.00 -  1.00 x 1000/?L  RBC Morphology Present   Polychromasia Present (A) None  Target Cells 2+ (A) None  Sickle Cells Present (A) None  Howell-Jolly Bodies Present (A) None  Pappenheimer Bodies Present (A) None Troponin T High Sensitivity, Emergency; 0 hour baseline AND 1 hour with reflex (3 hour)  Collection Time: 05/17/21  5:15 PM Result Value Ref Range  High Sensitivity Troponin T <6 See Comment ng/L EKG  Collection Time: 05/17/21  5:25 PM Result Value Ref Range  Heart Rate 56 bpm  QRS Duration 96 ms  Q-T Interval 421 ms  QTC Calculation(Bezet) 408 ms  P Axis 58 deg  R Axis 41 deg  T Axis 29 deg  P-R Interval 158 msec  ECG - SEVERITY Normal ECG severity Troponin T High Sensitivity, 1 Hour With Reflex (BH GH LMW YH)  Collection Time: 05/17/21  6:24 PM Result Value Ref Range  High Sensitivity Troponin T <6 See Comment ng/L  1 hour Delta from 0 Hour, HS-Troponin T 0 ng/L Diagnostics:  Imaging read and reviewed by me showing: CXR (portable) Final Result   No acute cardiothoracic abnormality.  Reported And Signed By: Dulce Sellar, MD    [facility] Radiology and Biomedical Imaging   ECG/Tele Events: ekg read and reviewed by me showing: NSR in 70s, nl axis, nl intervals, no acute ischemic changes, no pathological Q waves, no significant change from prior ekgAssessment: 25 y/o male w h/o sickle cell disease (Hgb SS) w h/o acute chest syndrome, dactylitis, and avascular necrosis of left femoral head who is now a/w sickle cell pain crisis (back/leg pains x 1 day)FYI plan:    25 y/o male w h/o sickle cell disease (HbSS) and occasional pain crisis.      Transfuse for Hct < 15 or symptoms such as increase in shortness of breath or easy fatigue (pain is not a symptom for which we transfuse).      For pain in the ED recommend ketorolac 15 mg IV x 1 and hydromorphone 1 mg IV/SC q30 min x 3 and then evaluate for discharge or admission. If plan for admission, recommend PCA hydromorphone 0.5 q10 min 1 h lock out 3 mg. Continue ketorolac 15 mg IV x total 20 doses. Offer lidocaine patches, hot/cold packs, APAP. Pruritis, nausea uncommon in this patient. ED course:  Dilaudid 1mg  iv x 1, Toradol 15mg  iv x 1, zofran 4mg  iv x 1Plan: 1. Sickle cell pain crisis: unclear trigger. afebrile-Dilaudid PCA per FYI plan: PCA hydromorphone 0.5 q10 min 1 h lock out 3 mg. With toradol 15mg  iv q6h prn moderate pain. lidocaine patches, hot/cold packs, APAP prn-cont hydroxyurea 1500mg  daily-miralax/senna bid-transfuse for Hgb < 15-pt states he is on Pen V 500mg  bid until 11/19 so will continueFEN: regular diet, IVFPPx: Lovenox scCode: [facility] Advance Care Planning Patient has a living will: yesPatient has healthcare representative/proxy: yesMother Lady Gary ricePlease enter code [id number] if patient answers Yes Notifications: PCP: Delphia Grates (Inactive)  Primary Care Provider was notified of this admission. No Plan discussed with patient. Dierdre Forth, MD - Internal Medicine Hospitalist Attending Physician - pager [phone], personal cell phone/iPhone13 [phone], Mobile Heartbeat Cell [phone](Shift 6pm - 7am overnight), during off-shift hours please call the Hospitalist Coverage Pager for current active coverage at [phone] seen and examined by me on:11/14/20229:08 PM

## 2021-05-19 ENCOUNTER — Telehealth
Admit: 2021-05-19 | Payer: PRIVATE HEALTH INSURANCE | Attending: Pediatric Hematology-Oncology | Primary: Student in an Organized Health Care Education/Training Program

## 2021-05-19 ENCOUNTER — Telehealth
Admit: 2021-05-19 | Payer: PRIVATE HEALTH INSURANCE | Attending: Family | Primary: Student in an Organized Health Care Education/Training Program

## 2021-05-19 DIAGNOSIS — D571 Sickle-cell disease without crisis: Secondary | ICD-10-CM

## 2021-05-19 DIAGNOSIS — Z20822 Contact with and (suspected) exposure to covid-19: Secondary | ICD-10-CM

## 2021-05-19 DIAGNOSIS — J0300 Acute streptococcal tonsillitis, unspecified: Secondary | ICD-10-CM

## 2021-05-19 DIAGNOSIS — R8281 Pyuria: Secondary | ICD-10-CM

## 2021-05-19 LAB — BASIC METABOLIC PANEL
BKR ANION GAP: 8 (ref 7–17)
BKR BLOOD UREA NITROGEN: 9 mg/dL (ref 6–20)
BKR BUN / CREAT RATIO: 13 (ref 8.0–23.0)
BKR CALCIUM: 8.9 mg/dL (ref 8.8–10.2)
BKR CHLORIDE: 102 mmol/L (ref 98–107)
BKR CO2: 24 mmol/L (ref 20–30)
BKR CREATININE: 0.69 mg/dL (ref 0.40–1.30)
BKR EGFR, CREATININE (CKD-EPI 2021): >60 mL/min/{1.73_m2} (ref >=60)
BKR GLUCOSE: 108 mg/dL — ABNORMAL HIGH (ref 70–100)
BKR POTASSIUM: 4.3 mmol/L (ref 3.3–5.3)
BKR SODIUM: 134 mmol/L — ABNORMAL LOW (ref 136–144)

## 2021-05-19 LAB — CBC WITHOUT DIFFERENTIAL
BKR WAM ANALYZER ANC: 12.56 x 1000/ÂµL — ABNORMAL HIGH (ref 2.00–7.60)
BKR WAM HEMATOCRIT (2 DEC): 18.7 % — ABNORMAL LOW (ref 38.50–50.00)
BKR WAM HEMOGLOBIN: 6.5 g/dL — ABNORMAL LOW (ref 13.2–17.1)
BKR WAM MCH (PG): 35.9 pg — ABNORMAL HIGH (ref 27.0–33.0)
BKR WAM MCHC: 34.8 g/dL (ref 31.0–36.0)
BKR WAM MCV: 103.3 fL — ABNORMAL HIGH (ref 80.0–100.0)
BKR WAM MPV: 9.5 fL (ref 8.0–12.0)
BKR WAM PLATELETS: 433 x1000/ÂµL — ABNORMAL HIGH (ref 150–420)
BKR WAM RDW-CV: 18.5 % — ABNORMAL HIGH (ref 11.0–15.0)
BKR WAM RED BLOOD CELL COUNT.: 1.81 M/ÂµL — ABNORMAL LOW (ref 4.00–6.00)
BKR WAM WHITE BLOOD CELL COUNT: 16.9 x1000/ÂµL — ABNORMAL HIGH (ref 4.0–11.0)

## 2021-05-19 LAB — MAGNESIUM: BKR MAGNESIUM: 1.8 mg/dL (ref 1.7–2.4)

## 2021-05-19 LAB — URINE CULTURE: BKR URINE CULTURE, ROUTINE: NO GROWTH

## 2021-05-19 MED ORDER — NALOXONE 4 MG/ACTUATION NASAL SPRAY
4 mg/actuation | 1 refills | Status: AC
Start: 2021-05-19 — End: ?

## 2021-05-19 MED ORDER — PENICILLIN V POTASSIUM 500 MG TABLET
500 mg | ORAL_TABLET | Freq: Two times a day (BID) | ORAL | 1 refills | Status: AC
Start: 2021-05-19 — End: ?

## 2021-05-19 MED ORDER — ACETAMINOPHEN 325 MG TABLET
325 mg | Freq: Three times a day (TID) | ORAL | Status: DC
Start: 2021-05-19 — End: 2021-05-20
  Administered 2021-05-19: 21:00:00 325 mg via ORAL

## 2021-05-19 MED ORDER — OXYCODONE ER 13.5 MG CAPSULE SPRINKLE EXTEND RELEASE 12HR(DON'T CRUSH)
13.5 mg | ORAL_CAPSULE | Freq: Two times a day (BID) | ORAL | 1 refills | Status: AC
Start: 2021-05-19 — End: 2021-05-19

## 2021-05-19 MED ORDER — OXYCODONE IMMEDIATE RELEASE 15 MG TABLET
15 mg | ORAL_TABLET | 1 refills | Status: AC
Start: 2021-05-19 — End: 2021-05-19

## 2021-05-19 MED ORDER — OXYCODONE IMMEDIATE RELEASE 15 MG TABLET
15 mg | ORAL_TABLET | 1 refills | Status: AC
Start: 2021-05-19 — End: 2021-05-28

## 2021-05-19 MED ORDER — OXYCODONE ER 13.5 MG CAPSULE SPRINKLE EXTEND RELEASE 12HR(DON'T CRUSH)
13.5 mg | ORAL_CAPSULE | Freq: Two times a day (BID) | ORAL | 1 refills | Status: AC
Start: 2021-05-19 — End: 2021-05-28

## 2021-05-19 MED ORDER — OXYCODONE IMMEDIATE RELEASE 30 MG TABLET
30 mg | ORAL | Status: DC
Start: 2021-05-19 — End: 2021-05-20
  Administered 2021-05-19 (×2): 30 mg via ORAL

## 2021-05-19 MED ORDER — ZZ IMS TEMPLATE
ORAL | Status: DC
Start: 2021-05-19 — End: 2021-05-20

## 2021-05-19 MED ORDER — OXYCODONE IMMEDIATE RELEASE 15 MG TABLET
15 mg | ORAL | Status: DC
Start: 2021-05-19 — End: 2021-05-20

## 2021-05-19 NOTE — Telephone Encounter
Call returned to Phillip Bell. He was discharged to day from hospital and reports having no outpatient pain medications. Reports rx that I sent dated for 05/21/2021 cannot be released per pharmacy.  Telephone call to pharmacist.  Rx oxycodone and Xtampza sent dated 05/19/2021, okay per pharmacy to refill. Nadene Rubins, APRN

## 2021-05-19 NOTE — Plan of Care
Plan of Care Overview/ Patient Status    Problem: Adult Inpatient Plan of CareGoal: Plan of Care ReviewCase Management Screening  Flowsheet Row Most Recent Value Case Management Screening: Chart review completed. If YES to any question below then proceed to CM Eval/Plan  Is there a change in their cognitive function No Do you anticipate a change in this patient's physicial function that will effect discharge needs? No Has there been a readmission within the last 30 days No Were there services prior to admission ( Examples: Assisted Living, HD, Homecare, Extended Care Facility, Methadone, SNF, Outpatient Infusion Center) No Negative/Positive Screen Negative Screening: Case Management department will follow patient's progress and discuss plan of care with treatment team. Case Manager Attestation  I have reviewed the medical record and completed the above screen. CM staff will follow patient's progress and discuss the plan of care with the Treatment Team. Yes   Outcome: Interventions implemented as appropriate 25 y/o male w h/o sickle cell disease (Hgb SS) w h/o acute chest syndrome, dactylitis, and avascular necrosis of left femoral head who is now a/w sickle cell pain crisis (back/leg pains x 1 day)CM met with patient at bedside to complete screening. Patient reports coming in from home. He reports having a cane for DME, and no home care services. CM will follow patient's progress with team, and will arrange post hospital care as needed. [facility] TartMSW Care [phone]

## 2021-05-19 NOTE — Plan of Care
1615-1900Admission Note Nursing Phillip Bell is a 25 y.o. male admitted with a chief complaint of SCC pain. Patient arrived from  ED.Patient is AAOx4, afebrile VSS on RA. Pain to lower back, upper thighs, and RUE. Utilizing CADD pump dilaudid 1-1 w/ IVF running at 100cc/hr. Also receiving q3hr oxy and scheduled Toradol for pain management. PIV x2 #20. H/H 6.6/18.5, transfuse <hct 15. OOB SBA w/ cane recent L femoral surg. Skin intact. Resting in bed. See flowsheets for further info.   Vitals:  05/18/21 1127 05/18/21 1351 05/18/21 1611 05/18/21 1647 BP: 130/77 123/69 128/79 117/67 Pulse: 83 85 85 89 Resp: 19 18 18 18  Temp: 99.4 ?F (37.4 ?C)  99.4 ?F (37.4 ?C) 98.7 ?F (37.1 ?C) TempSrc: Oral  Oral  SpO2: 99% 98% 98% 100% Weight:   62.7 kg  Height:   6' 2 (1.88 m)  Oxygen therapy Oxygen TherapySpO2: 100 %Device (Oxygen Therapy): room airI have reviewed the patient's current medication orders..I have reviewed patient valuables Belongings charted in last 7 days: Valuable(s) : Cell Phone; Civil Service fast streamer; Gilmer Mor; Wallet; Jewelry; Other (Comments); Vision (airpods, apple Horticulturist, commercial) (05/18/2021  4:11 PM) See flowsheets, patient education and plan of care for additional information.

## 2021-05-19 NOTE — Other
PHARMACY-ASSISTED MEDICATION REPORTPharmacist review of the best possible medication history obtained by the pharmacy medication history technician has been performed.  I have updated the home medication list and identified the following information that may be relevant to this admission.NOTES/RECOMMENDATIONS None at this time.       Prior to Admission Medications Medication Name Sig Taking? Patient Reported   hydroxyurea (HYDREA) 500 mg capsuleLast dose:  --  3 tab daily for blood. Yes     ibuprofen (ADVIL,MOTRIN) 600 mg tabletLast dose:  --  Up to 4 tablets daily as needed for pain. Yes     lidocaine maalox diphenhydramine (MAGIC MOUTHWASH) 1:1:1Last dose:  --  Swish and spit 5 mLs 4 (four) times daily as needed. Yes     naloxone (NARCAN) 4 mg/actuation nasal sprayLast dose:  --  Use 1 spray in 1 nostril for suspected opioid overdose. May repeat in 2 minutes in other nostril with new device if minimal or no response. Yes     ondansetron (ZOFRAN-ODT) 4 mg disintegrating tabletLast dose:  --  Take 1 tablet (4 mg total) by mouth every 6 (six) hours as needed for up to 7 doses. Yes     oxyCODONE (ROXICODONE) 15 mg Immediate Release tabletLast dose:  -- Last Medication Note: >> HINES-CAMPBELL, CORETTA   Tue May 18, 2021  4:32 AMMedHx Tech(Coretta Hines-Campbell, CPHT): FLAG FOR REMOVAL -  Duplicate prescriptions (same dose/sig), please remove this entry using the Delete/Clean up and utilize a new entryEntered by Hines-Campbell, Coretta, CPHT Tue May 18, 2021 3474 Take 1 to 2 tablets by mouth every 4-6 hours as needed for moderate or severe pain respectively. Max 8 tabs per day.       oxyCODONE (ROXICODONE) 15 mg Immediate Release tabletLast dose:  --  1 - 3 tablets every 3 h as needed for pain. Yes     oxyCODONE myristate (XTAMPZA ER) 13.5 mg 12 hr extended release sprinkle capsuleLast dose:  --  Take 1 capsule (13.5 mg total) by mouth every 12 (twelve) hours. Long-acting medicine to control pain. 340b. D57.1. For prior authorization Fax [phone]. Yes     penicillin VK (VEETID) 250 mg/5 mL suspensionLast dose: 05/17/2021 at Unknown time Take 10 mLs (500 mg total) by mouth 2 (two) times daily for 10 days. Yes     senna (SENOKOT) 8.6 mg tabletLast dose: Not Taking at Unknown timeLast Medication Note: >> HINES-CAMPBELL, CORETTA   Tue May 18, 2021  4:31 AMMedHx Tech(Coretta Hines-Campbell, CPHT): FLAG FOR REMOVAL -  Confirmed not taking with the patientEntered by Hines-Campbell, Coretta, CPHT Tue May 18, 2021 0431 Take 2 tablets (17.2 mg total) by mouth every 3 (three) hours as needed for constipation.Patient not taking: Reported on 05/18/2021       Prior to admission medications last reviewed by Hines-Campbell, Coretta, CPHT on Tue May 18, 2021 4332 Thank you,Mileena Rothenberger, PharmD11/15/202211:12 PMPhone: MHB

## 2021-05-19 NOTE — Discharge Summary
[facility] Discharge SummaryPatient Data:  Patient Name: Phillip Bell Admit date: 05/17/2021 Age: 25 y.o. Discharge date: 05/19/2021 DOB: 05/04/1996	 Discharge Attending Physician: Carmelia Roller, MD  MRN: [id number]	 Discharged Condition: good PCP: Nadene Rubins, APRN Disposition: Home  Principal Diagnosis: Sickle cell pain crisisSecondary Diagnoses:  Group A Strep tonsillitis Issues to be Addressed Post Discharge: Issues to be Addressed Post Discharge:1.	Follow up in sickle cell clinic2.	Follow up results of STI testing Relevant Medications on Discharge:Changes in Medications: changed Penicillin to tablet form (instead of liquid) to complete remainder of course per patient preferencePending Labs and Tests: Pending Lab Results   Order Current Status  C. trachomatis / N. gonorrhoeae, NAAT Collected (05/19/21 0912)  Chlamydia trachomatis, NAAT     ([facility] LMW [facility]) Collected (05/19/21 0912)  Neisseria gonorrhoeae, NAAT     ([facility] GH L LMW [facility]) Collected (05/19/21 0912)  Follow-up Information:Calhoun, Darrel Reach, MD[address] [id number] upSickle cell clinic follow up as scheduled Future Appointments Date Time Provider Department Center 06/22/2021  1:30 PM Luis Abed, MD ORT [facility] CAD 08/12/2021 10:00 AM Nadene Rubins, APRN SMIL [facility]MAT [facility] Course: Hospital Course: Phillip Bell is a 25 y.o. male with PMH significant for sickle cell disease (HbSS) with prior history of acute chest syndrome and AVN of left femoral head. He presented on 05/17/21 with acute sickle cell pain crisis in setting of recent Strep A tonsillitis and was admitted for acute sickle cell pain crisis without evidence of acute chest syndrome. Hematology was consulted. The patient's pain was managed with Dilaudid PCA pump, home long acting Oxycontin, home PO oxycodone tier 15/30/45 mg, scheduled toradol, scheduled Tylenol, and lidocaine patch. On the day of discharge, the patient's pain was well-managed with oral medications alone, and he was not using his PCA pump. His pain medications were refilled upon discharge.Regarding his recent diagnosis of Group A strep tonsillitis (positive swab 05/12/21), he was continued on Penicillin V with improvement in symptoms. On the day of discharge, he denied sore throat. He requested that we send Rx for Pencillin V tablets as he only has liquid form at home (which he required when tonsils were more swollen - now swallowing tablets here w/o issue). This Rx was sent per patient request, and he will complete the course on 05/22/21.On admission, the patient was noted to have pyuria on UA, but urine culture was w/o growth. Given history of [place] in chart, GC/[place] testing was sent and results were pending at time of discharge. This should be followed up with PCP. The patient denied any dysuria, hesitancy, urgency. Leukocytosis most likely attributable to Strep infection and pain crisis and was down-trending on the day of discharge.Inpatient Consultants and summary of recommendations:Hematology: Recommended continue hydrea 1500 daily;  outpatient follow up in [facility] clinic Pertinent Procedures or Surgeries: N/APertinent lab findings and test results: Objective: Recent Labs Lab 11/14/221625 11/15/220500 11/16/220550 WBC 17.6* 28.0* 16.9* HGB 7.4* 6.6* 6.5* HCT 21.20* 18.50* 18.70* PLT 639* 444* 433*  No results for input(s): NEUTROPHILS, LABLYMP, LABEOS, BANDSP in the last 168 hours. Recent Labs Lab 11/14/221625 11/15/220500 11/16/220550 NA 139 137 134* K 4.8 4.5 4.3 CL 103 102 102 CO2 23 24 24  BUN 10 12 9  CREATININE 0.71 0.67 0.69 GLU 111* 128* 108* ANIONGAP 13 11 8   Recent Labs Lab 11/14/221625 11/15/220500 11/15/220500 11/16/220550 CALCIUM 9.5 9.5  --  8.9 MG  --  1.8   < > 1.8 PHOS  --  3.8  --   --   < > =  values in this interval not displayed.  Recent Labs Lab 11/14/221625 ALT 22 AST 39* ALKPHOS 85 BILITOT 1.2  No results for input(s): PTT, LABPROT, INR in the last 168 hours. Culture Information:Recent Labs Lab 11/15/221054 LABURIN No Growth Imaging: Imaging results last 1 week:  CXR (portable)Result Date: 05/17/2021 No acute cardiothoracic abnormality. Reported And Signed By: Dulce Sellar, MD  [facility] Diet:  Regular dietMobility:    Physical Exam Discharge vitals: Temp:  [98.5 ?F (36.9 ?C)-99.4 ?F (37.4 ?C)] 99 ?F (37.2 ?C)Pulse:  [75-95] 85Resp:  [16-18] 16BP: (117-136)/(64-81) 120/73SpO2:  [96 %-100 %] 98 %Device (Oxygen Therapy): room air Pertinent Findings of Physical Exam: UnremarkableCognitive Status at Discharge: Alert and Oriented x 3Discharge Physical Exam:Physical ExamVitals and nursing note reviewed. Constitutional:     General: He is not in acute distress.   Appearance: Normal appearance. He is not toxic-appearing. HENT:    Mouth/Throat:    Pharynx: Oropharynx is clear. Posterior oropharyngeal erythema (mild) present. No oropharyngeal exudate.    Tonsils: No tonsillar exudate.    Comments: + bilateral tonsils enlarged L>R. Airway is patent Cardiovascular:    Rate and Rhythm: Normal rate and regular rhythm.    Heart sounds: Normal heart sounds. Pulmonary:    Effort: Pulmonary effort is normal.    Breath sounds: Normal breath sounds. Abdominal:    General: Bowel sounds are normal. There is no distension.    Palpations: Abdomen is soft.    Tenderness: There is no abdominal tenderness. Musculoskeletal:       General: Tenderness (L elbow) present.    Right lower leg: No edema.    Left lower leg: No edema. Skin:   General: Skin is warm and dry. Neurological:    Mental Status: He is alert and oriented to person, place, and time. Psychiatric:       Mood and Affect: Mood normal. Allergies Allergies Allergen Reactions ? Nickel Rash  PMH PSH Past Medical History: Diagnosis Date ? Aplastic crisis (HC Code) (HC CODE) (HC Code) 6/6/ 2005 transfusion ? Avascular necrosis of femur head, left (HC Code) (HC CODE) (HC Code)  ? Chronic pain   sickle cell ? Conductive hearing loss 09/22/14 P.E tubes placed ? Dactylitis one episode  April, 1998 ? GERD (gastroesophageal reflux disease)  ? 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 On hydroxyurea therapy started October 2013 ? Pneumococcal vaccination given 01/20/2006 and 09/28/2012 ? 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 Vasoocclusive sickle cell crisis (HC Code) (HC CODE) (HC Code) Adm 01/02/2012, ER 04/08/12, 4/1-10/03/12, 7/2-01/05/13, 12/02/13-12/03/13 , March 2016 - 2 admissions  Past Surgical History: Procedure Laterality Date ? CHOLECYSTECTOMY, LAPAROSCOPIC  1/15 ? TYMPANOSTOMY TUBE PLACEMENT  09/22/2014  by Dr. Bernita Raisin  Social History Family History Social History Tobacco Use ? Smoking status: Never Smoker ? Smokeless tobacco: Never Used Substance Use Topics ? Alcohol use: Yes   Comment: beer and liquor on occasion  Family History Problem Relation Age of Onset ? Sickle cell trait Mother  ? Thyroid disease Mother  ? Sickle cell trait Father  ? Sarcoidosis Father  ? Diabetes Paternal Grandfather  ? Hypertension Paternal Grandfather  ? Alcohol abuse Maternal Aunt   Discharge Medications: Discharge: Current Discharge Medication List  START taking these medications  Details penicillin v potassium (VEETID) 500 mg tablet Take 1 tablet (500 mg total) by mouth 2 (two) times daily for 3 days.Qty:  6 tablet, Refills: 0Start date: 05/19/2021, End date: 05/22/2021   CONTINUE these medications which have CHANGED  Details naloxone (NARCAN) 4 mg/actuation nasal spray Use 1 spray in 1 nostril for suspected opioid overdose. May repeat in 2 minutes in other nostril with new device if minimal or no response.Qty: 2 each, Refills: 0Start date: 05/19/2021  oxyCODONE (ROXICODONE) 15 mg Immediate Release tablet 1 - 3 tablets every 3 h as needed for pain.Qty: 60 tablet, Refills: 0Start date: 05/21/2021  Associated Diagnoses: Hb-SS disease without crisis (HC Code)  oxyCODONE myristate (XTAMPZA ER) 13.5 mg 12 hr extended release sprinkle capsule Take 1 capsule (13.5 mg total) by mouth every 12 (twelve) hours. Long-acting medicine to control pain. 340b. D57.1. For prior authorization Fax [phone].Qty: 28 capsule, Refills: 0Start date: 05/21/2021  Associated Diagnoses: Hemoglobin SS disease without crisis (HC Code)   CONTINUE these medications which have NOT CHANGED  Details hydroxyurea (HYDREA) 500 mg capsule 3 tab daily for blood.Qty: 90 capsule, Refills: 2  ibuprofen (ADVIL,MOTRIN) 600 mg tablet Up to 4 tablets daily as needed for pain.Qty: 100 tablet, Refills: 1  lidocaine maalox diphenhydramine (MAGIC MOUTHWASH) 1:1:1 Swish and spit 5 mLs 4 (four) times daily as needed.Qty: 300 mL, Refills: 0  ondansetron (ZOFRAN-ODT) 4 mg disintegrating tablet Take 1 tablet (4 mg total) by mouth every 6 (six) hours as needed for up to 7 doses.Qty: 7 tablet, Refills: 0  senna (SENOKOT) 8.6 mg tablet Take 2 tablets (17.2 mg total) by mouth every 3 (three) hours as needed for constipation.Qty: 30 tablet, Refills: 0   STOP taking these medications   penicillin VK (VEETID) 250 mg/5 mL suspension    There was at total of approximately 35 minutes spent on the discharge of this patientElectronically Signed:Lindsie Simar Telford Nab, APRN 05/19/2021 2:58 PMBest Contact Information: Available via MHB until 1800

## 2021-05-19 NOTE — Plan of Care
Problem: Adult Inpatient Plan of CareGoal: Readiness for Transition of CareOutcome: Outcome(s) achieved Plan of Care Overview/ Patient Status    Case Management Plan  Flowsheet Row Most Recent Value Discharge Planning  Patient/Patient Representative goals/treatment preferences for discharge are:  home with no needs Patient/Patient Representative was presented with a list of facilities, agencies and/or dme providers and Referral(s) placed for: None Mode of Transportation  Private car  (add comment for special considerations) Patient accompanied by friend CM D/C Readiness  PASRR completed and approved N/A Authorization number obtained, if required N/A Is there a 3 day INPATIENT Qualifying stay for Medicare Patients? N/A Medicare IM- signed, dated, timed and scanned, if required N/A DME Authorized/Delivered N/A No needs identified/ follow up with PCP/MD N/A Post acute care services secured W10 complete N/A Pri Completed and Accepted  N/A Is the destination address correct on the W10 N/A Finalized Plan  Expected Discharge Date 05/19/21 Discharge Disposition Home or Self Care  Patient is medically ready for discharge. Patient is discharging home with no needs. Patient to be transported home by a friend. Patient in agreement with discharge plan. [facility] TartMSW Care [phone]

## 2021-05-19 NOTE — Telephone Encounter
Patient spoke with Queen Slough APRN

## 2021-05-19 NOTE — Progress Notes
[facility]	 [facility]	Medicine Progress NoteHospitalist ServiceAttending Provider: Garvin Fila, MD [phone]:  B03 HALL/B03 HALLHospital Day #1 Subjective: CC: Sickle cell pain crisis  Interim History: Patient seen and examined 05/18/21 AM. Reports pain to lower back and right arm. Asking if he can resume his oral pain medications for additional pain control. Reports sore throat has improved since starting antibiotics for strep throat. Denies cough, myalgias, dysuria. No acute events overnight.Review of Allergies/Meds/Hx: I have reviewed the patient's: allergies, current scheduled medications, current infusions, current prn medications and past medical historyObjective: Vitals:Temp:  [97.8 ?F (36.6 ?C)-99.4 ?F (37.4 ?C)] 99.4 ?F (37.4 ?C)Pulse:  [60-104] 85Resp:  [15-20] 18BP: (123-143)/(69-84) 123/69SpO2:  [98 %-100 %] 98 %Device (Oxygen Therapy): room air I/O's:I have reviewed the patient's current I&O's as documented in the EMR.Physical Exam:General: Awake, NADHEENT: MMM, anicteric; oropharynx without exudate, swelling or erythemaCardiac: S1 S2 RRRLungs: CTA bilaterally, room airAbdomen: Soft, non-tender, normoactive bowel soundsExtremities: No LE edema; Lower back tender to palpationNeuro: Alert and oriented Labs:BUN/Cr/GLU/ALT/AST/AMYLASE/LIPASE:  12/0.67/128/--/--/--/-- (11/15 0500)Na/K/Cl/CO2:  137/4.5/102/24 (11/15 0500)WBC/Hgb/Hct/Plts:  28.0/6.6/18.50/444 (11/15 0500) Culture Data:05/17/21 SARS CoV-2 RNA: Negative11/14/22 Influenza A/B: Negative11/14/22 Urine culture: In processDiagnostics:05/17/21 NWG:[id number]: ?No acute cardiothoracic abnormality. Assessment: Assessment: 25 y.o. male PMH significant for sickle cell disease (Hgb SS) with prior hx/o acute chest syndrome and AVN of left femoral head. Presented on 05/17/21 with acute sickle cell pain crisis in setting of recent Strep tonsillitis. Admitted for acute sickle cell pain crisis without evidence of acute chest. Plan: #Sickle cell pain crisis-Low suspicion for acute chest 2/2 CXR without infiltrates, SpO2 stable on room air-Appreciate Hematology consult and recommendations-Likely 2/2 Strep A tonsillitis-Patient reports breakthrough pain despite PCA. Discussed with Heme fellow, will start outpatient oral tier in addition to PCA-Transfuse for Hct <15-Heat/cold packs PRN-HOLD Hydroxyurea-Start Oxycontin 15 mg PO Q12H (in place of Xtampza ER)-Start ketorolac 15 mg IVP Q6H-Cont Dilaudid PCA (0.5 mg Q72min, 1 hour lock out: 3 mg)-Cont Lidoderm patch to lower back daily-Cont Miralax 17 g PO BID-Cont Senna 1 tablet PO BID#Strep A tonsillitis-119/22 Group A Strep, throat culture: Positive-Pain improved since starting antibiotics-No exudate on exam-Cont Penicillin V 500 mg PO Q6H (end date 05/22/21)#Leukocytosis#Pyuria-Denies dysuria, hesitancy, urgency-May also be stress response from acute sickle crisis and recent tonsillitis-UA positive (4 RBC, 23 WBC, trace blood, 3+ leukocytes)-Urine culture: In process-Afebrile-Since asymptomatic, will hold off on initiating antibiotics however if pt febrile or symptomatic, consider CTX while urine culture pending Code Status Full ACLS DVT Prophylaxis Lovenox ADMIT MED REC Pending Pharmacist review PCP Delphia Grates (Inactive) [phone] Emergency contact Extended Emergency Contact InformationPrimary Emergency Contact: Coppin,KatrinaHome Phone: [phone] Disposition  TBD pending clinical course Signed:Marissa Sanca, APRNPlease contact via Mobile HeartbeatAfter 18:00, please contact covering provider or provider assigned to the dynamic role of [facility] Hospitalist Coverage if patient is located at [place], or [facility] Hospitalist Coverage if patient is located at [facility]. 2:36 PM Attending Attestation:Chart reviewed and patient examined independently by me today.  I agree with the above note's history/exam/assessment/plan with the following exceptions/additions.  The patient was seen on rounds and reported ongoing back pain.  He denied recent fever/chills, shortness of breath, cough, sinus pressure, headache, dysuria, diarrhea.  Said that his sore throat has been improving since starting antibiotics for strep pharyngitis.Exam:Gen - NADHEENT/neck - OP clear, MMM, no JVDCV - RRR no m/r/gPulm - CTABGI - NABS, soft, NT/ND, no guarding/reboundMSK/Extr - no c/c/eDerm - no rashesNeuro - A&Ox4Recent data reviewed and notable for:  Procalcitonin within normal limits, hemoglobin 6.6/hematocrit 18.5, WBC  28A/P:-Sickle cell pain crisis - agree with the plan for pain management as detailed above, possible trigger was recent strep infection-Recent diagnosis of strep A tonsillitis - will continue penicillin V as detailed above-Leukocytosis - UA suggestive of possible UTI, awaiting urine culture result.  Patient seems to be improving symptomatically from his Strep A infection, so will not perform additional imaging such as [place] sinuses at this time.  Will recheck WBC in AM.  [place], MDHospitalistPlease contact via MHB with any questions.

## 2021-05-19 NOTE — Plan of Care
[facility]	Spiritual Care NotePurpose: Spiritual careObservation: People present/Information Obtained From: Patient Emotional Mood: In Good Spirits Subjective: Patient shared that he is hoping to get home today.  Discussed feeling good other than a few small pains.  Shared about history of illness.Spiritual Assessment:  Information Obtained From: Patient Mood: In Good Spirits  Religious Affiliation: Christian Spiritual Interventions:Spiritual Intervention Index**Date of Spiritual Visit: 11/16/22Visit Type: Initial VisitLanguage or special accommodation rendered?: NoIntervention Type: Spiritual VisitResponding Chaplain: Unit ChaplainSpiritual/Religious Support Provided: Companionship, Introduction to [employer], Life ReviewFollow-Up Visit Needed: NoOutcome: OUTCOMES: Expressed Spiritual/Religious Resources or Distress: Expressed gratitude   OUTCOMES: Progressed Spiritually: Knowledgeable about services of the Dept. of Spiritual Care, Established chaplain relationshipPlan: Spiritual Care remain available as needed.  If any of the following needs arise, please contact spiritual care: N: New diagnosisE: Emotional / spiritual distressE: Existential distressD: Decision making / goals of care meetingsS: Support for staff and patients' loved ones C: Compromised copingA: Anxiety / stress / grief / loneliness R: Religious / cultural / ritual needsE: End-of-life care / death / dying   Total Consult Time: 15 minutesSigned: Rev.Jerald Kief M.Div.	[facility], [place] [address]P:[phone]: [phone] 11/16/20223:56 PM

## 2021-05-19 NOTE — Plan of Care
Plan of Care Overview/ Patient Status    1900-0700Pt AOx4, VSS on RAIndependent OOB to bathroom, LBM 11/15Penicillin given as scheduledC/o 8/10 lower back and leg pain, Oxycodone oral tier given and pt utilizing Dilaudid PCA pump. Heat packs provided. No acute changes overnight Safety maintained, call bell within reach, meds given per [facility]. WCTM

## 2021-05-19 NOTE — Discharge Instructions
Physician Discharge InstructionsIt was a pleasure caring for [facility] during [facility]. Take your medication exactly as directed. Do not skip doses. Please keep a copy of your current medications with you at ALL times.  Bring this with you to all MD appointments and to the Hospital/Emergency Room.  Please go to all follow-up appointments made for you and please schedule any suggested appointments at your earliest convenience. If you are unable to make an appointment during the suggested time frame, please call the Hospitalist Service and we will help you arrange it.Call your doctor or return to the ED if you develop fever, chills, chest pain, shortness of breath or if you have any concerns. If you have questions about your hospitalization, please call your primary care provider or the [facility] Hospitalist service at: [phone].

## 2021-05-19 NOTE — Telephone Encounter
Patient called in and stated he just left the hospital and his medications are on hold until the 19th oxcycode 15 mg tablets and  xtampca tablets  patient stated he needs them now or he will have to go back to the hospital

## 2021-05-19 NOTE — Plan of Care
Phillip Bell was discharged via ambulatory accompanied by Alone.  Verbalized understanding of discharge instructionsand recommended follow up care as per the after visit summary.  Written discharge instructions provided. Denies any further questions. PIV removed. Home via private car.Vital signs    Vitals:  05/19/21 0006 05/19/21 0544 05/19/21 0728 05/19/21 1130 BP: 136/65 123/70 120/64 120/73 Pulse: (!) 95 (!) 91 85 85 Resp: 16 16 16 16  Temp: 99.2 ?F (37.3 ?C) 98.8 ?F (37.1 ?C) 98.8 ?F (37.1 ?C) 99 ?F (37.2 ?C) TempSrc: Oral Oral Oral  SpO2: 98% 98% 96% 98% Weight:     Height:     Patient confirmed all belongings returned. Belongings charted in last 7 days: Valuable(s) : Psychologist, sport and exercise; Civil Service fast streamer; Gilmer Mor; Wallet; Jewelry; Other (Comments); Vision (airpods, apple Horticulturist, commercial) (05/18/2021  4:11 PM)

## 2021-05-20 LAB — CHLAMYDIA TRACHOMATIS, NAAT (LAB ORDER ONLY) (BH GH L LMW YH): BKR CHLAMYDIA, DNA PROBE: NEGATIVE

## 2021-05-20 LAB — NEISSERIA GONORRHEA, NAAT (LAB ORDER ONLY)   (BH GH L LMW YH): BKR NEISSERIA GONORRHOEAE, DNA PROBE: NEGATIVE

## 2021-05-20 NOTE — Consults
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 Reports 1 day of worsening pain in lower back and bl legs. States this is typical sites of pain for her. Tried home meds without relief and subsequently presented to ED. Endorses associated chills, no documented fevers. Has mild chest pain, painful when pressing on sternum. No SOB, or cough. Denies abdominal pain, n/v. Has been taking hydrea 1500mg  daily. Recently given abx for tonsillitis.ROS otherwise negative Past Medical History:Past Medical History: Diagnosis Date ? Aplastic crisis (HC Code) (HC CODE) (HC Code) 6/6/ 2005 transfusion ? Avascular necrosis of femur head, left (HC Code) (HC CODE) (HC Code)  ? Chronic pain   sickle cell ? Conductive hearing loss 09/22/14 P.E tubes placed ? Dactylitis one episode  April, 1998 ? GERD (gastroesophageal reflux disease)  ? 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 On hydroxyurea therapy started October 2013 ? Pneumococcal vaccination given 01/20/2006 and 09/28/2012 ? 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 Vasoocclusive sickle cell crisis (HC Code) (HC CODE) (HC Code) Adm 01/02/2012, ER 04/08/12, 4/1-10/03/12, 7/2-01/05/13, 12/02/13-12/03/13 , March 2016 - 2 admissions Past Surgical History:Past Surgical History: Procedure Laterality Date ? CHOLECYSTECTOMY, LAPAROSCOPIC  1/15 ? TYMPANOSTOMY TUBE PLACEMENT  09/22/2014  by Dr. Bernita Raisin Family History:Family History Problem Relation Age of Onset ? Sickle cell trait Mother  ? Thyroid disease Mother  ? Sickle cell trait Father  ? Sarcoidosis Father  ? Diabetes Paternal Grandfather  ? Hypertension Paternal Grandfather  ? Alcohol abuse Maternal Aunt  Social History:Social History Socioeconomic History ? Marital status: Single   Spouse name: Not on file ? Number of children: Not on file ? Years of education: Not on file ? Highest education level: Not on file Occupational History ? Occupation: Soil scientist Tobacco Use ? Smoking status: Never Smoker ? Smokeless tobacco: Never Used Substance and Sexual Activity ? Alcohol use: Yes   Comment: beer and liquor on occasion ? Drug use: Not Currently   Types: Marijuana   Comment: last used on Tuesday ? Sexual activity: Yes Other Topics Concern ? Not on file Social History Narrative  Lives in [place].    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  Will get a Masters, Set designer in business  [employer] Social Determinants of Conservation officer, historic buildings Strain: Not on [employer] Insecurity: Not on file Transportation Needs: Not on file Physical Activity: Not on file Stress: Not on file Social Connections: Not on file Intimate Partner Violence: Not on file Housing Stability: Not on file Medications:Current Facility-Administered Medications Medication ? acetaminophen (TYLENOL) tablet 650 mg ? D5 1/2 NS infusion ? diphenhydrAMINE (BENADRYL) capsule 25 mg ? enoxaparin (LOVENOX) injection 30 mg ? HYDROmorphone PF (DILAUDID) 1 mg/mL in sodium chloride 0.9 % 50 mL PCA ? hydroxyurea (HYDREA) capsule 1,500 mg ? ketorolac (TORadol) injection 15 mg ? lidocaine (LIDODERM) 5 % 2 patch ? melatonin tablet 6 mg ? metoclopramide (REGLAN) injection 10 mg ? ondansetron (PF) (ZOFRAN) injection 4 mg ? penicillin v potassium (VEETID) tablet 500 mg ? polyethylene glycol (MIRALAX) packet 17 g ? senna (SENOKOT) tablet 8.6 mg ? sodium chloride 0.9 % flush 3 mL ? sodium chloride 0.9 % flush 3  mL Current Outpatient Medications Medication Sig ? hydroxyurea (HYDREA) 500 mg capsule 3 tab daily for blood. ? ibuprofen (ADVIL,MOTRIN) 600 mg tablet Up to 4 tablets daily as needed for pain. ? lidocaine maalox diphenhydramine (MAGIC MOUTHWASH) 1:1:1 Swish and spit 5 mLs 4 (four) times daily as needed. ? naloxone (NARCAN) 4 mg/actuation nasal spray Use 1 spray in 1 nostril for suspected opioid overdose. May repeat in 2 minutes in other nostril with new device if minimal or no response. ? ondansetron (ZOFRAN-ODT) 4 mg disintegrating tablet Take 1 tablet (4 mg total) by mouth every 6 (six) hours as needed for up to 7 doses. ? [START ON 05/21/2021] oxyCODONE (ROXICODONE) 15 mg Immediate Release tablet 1 - 3 tablets every 3 h as needed for pain. ? [START ON 05/21/2021] oxyCODONE myristate (XTAMPZA ER) 13.5 mg 12 hr extended release sprinkle capsule Take 1 capsule (13.5 mg total) by mouth every 12 (twelve) hours. Long-acting medicine to control pain. 340b. D57.1. For prior authorization Fax [phone]. ? penicillin VK (VEETID) 250 mg/5 mL suspension Take 10 mLs (500 mg total) by mouth 2 (two) times daily for 10 days. ? oxyCODONE (ROXICODONE) 15 mg Immediate Release tablet Take 1 to 2 tablets by mouth every 4-6 hours as needed for moderate or severe pain respectively. Max 8 tabs per day. ? senna (SENOKOT) 8.6 mg tablet Take 2 tablets (17.2 mg total) by mouth every 3 (three) hours as needed for constipation. (Patient not taking: Reported on 05/18/2021) Physical Exam: Vitals: BP 138/84  - Pulse (!) 104  - Temp 98.9 ?F (37.2 ?C) (Oral)  - Resp 19  - SpO2 98% GEN: NADHEENT: MMMLYMPH: no cervical LADCV: RRR, pain with palpation of sternumPULM: CTAGI: soft, NT, NDEXT: WWP, no edema NEURO: no gross FNDData: Recent Labs Lab 11/09/221449 11/14/221625 11/15/220500 WBC 17.7* 17.6* 28.0* HGB 7.8* 7.4* 6.6* HCT 22.10* 21.20* 18.50* PLT 373 639* 444*  Recent Labs Lab 11/09/221449 NEUTROPHILS 66.4  Recent Labs Lab 11/09/221449 11/14/221625 11/15/220500 NA 137 139 137 K 4.5 4.8 4.5 CL 100 103 102 CO2 25 23 24  BUN 11 10 12  CREATININE 0.71 0.71 0.67 GLU 99 111* 128* ANIONGAP 12 13 11   Recent Labs Lab 11/09/221449 11/14/221625 11/15/220500 CALCIUM 9.2 9.5 9.5 MG  --   --  1.8 PHOS  --   --  3.8  Recent Labs Lab 11/09/221449 11/14/221625 ALT 28 22 AST 40* 39* ALKPHOS 85 85 BILITOT 2.4* 1.2 BILIDIR 0.4*  --   No results for input(s): PTT, LABPROT, INR in the last 168 hours. No results for input(s): PHART, PCO2ART, PO2ART, HCO3ART, O2SATART, LITERFLOW in the last 168 hours.    Imaging: Reviewed Pathology: ReviewedAssessment and Recommendations: Phillip Bell is a 25 y.o. 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 No e/o ACS (no infiltrate or hypoxia) or stroke.- continue dilaudid PCA with hypotonic fluids per pain plan - can continue hydrea 1500 daily given robust retic [place] - will need outpatient fu in SCD clinic Thank you for involving [place] in the care of this patient. Please do not hesitate to contact us with any questions. Seen and discussed with attending, Dr. Malen Gauze. Attending addendum to follow. [name], MDClinical Fellow, PGY-5Division of Medical Oncology/Hematology

## 2021-05-22 ENCOUNTER — Encounter
Admit: 2021-05-22 | Payer: PRIVATE HEALTH INSURANCE | Attending: Family | Primary: Student in an Organized Health Care Education/Training Program

## 2021-05-22 MED ORDER — LIDOCAINE 5 % ADHESIVE PATCH
5 % | MEDICATED_PATCH | TRANSDERMAL | 3 refills | Status: AC
Start: 2021-05-22 — End: ?

## 2021-05-28 ENCOUNTER — Encounter
Admit: 2021-05-28 | Payer: PRIVATE HEALTH INSURANCE | Attending: Adult Health | Primary: Student in an Organized Health Care Education/Training Program

## 2021-05-28 ENCOUNTER — Encounter
Admit: 2021-05-28 | Payer: PRIVATE HEALTH INSURANCE | Attending: Family | Primary: Student in an Organized Health Care Education/Training Program

## 2021-05-28 DIAGNOSIS — D571 Sickle-cell disease without crisis: Secondary | ICD-10-CM

## 2021-05-28 MED ORDER — IBUPROFEN 600 MG TABLET
600 mg | ORAL_TABLET | 2 refills | Status: AC
Start: 2021-05-28 — End: 2021-06-29

## 2021-05-28 MED ORDER — OXYCODONE IMMEDIATE RELEASE 15 MG TABLET
15 mg | ORAL_TABLET | 1 refills | Status: AC
Start: 2021-05-28 — End: 2021-06-10

## 2021-05-28 MED ORDER — OXYCODONE ER 13.5 MG CAPSULE SPRINKLE EXTEND RELEASE 12HR(DON'T CRUSH)
13.5 mg | ORAL_CAPSULE | Freq: Two times a day (BID) | ORAL | 1 refills | Status: AC
Start: 2021-05-28 — End: 2021-06-29

## 2021-05-28 NOTE — Telephone Encounter
RN called pt to let him no refills were called in. Pt verbalized understanding.

## 2021-05-28 NOTE — Telephone Encounter
Pt of Phillip Bell would like refills on his oxyCODONE 15mg , Xtampza 13.5mg  and Ibuprofen 600mg .to [facility] in [place] [phone].

## 2021-06-02 NOTE — Progress Notes
Adult Sickle [facility] [phone]/09/2022[name] DOB 1997 CCScheduled return for sickle cell disease.ProbPatient Active Problem List Diagnosis ? Eustachian tube disorder, bilateral ? Tonsillar and adenoid hypertrophy ? Sickle cell disease, type SS (HC Code) (HC CODE) (HC Code) ? 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 Hx acute chest syndrome (HC Code) ? Hx cholecystectomy ? Closed displaced segmental fracture of shaft of left femur, initial encounter (HC Code) ? Sickle cell disease without crisis (HC Code) (HC CODE) (HC Code) ? Decreased range of motion (ROM) of left knee ? Decreased strength of lower extremity ? Altered gait ? Sickle cell disease with crisis (HC Code) (HC CODE) (HC Code) ? Pneumonia ? Acute chest syndrome (HC Code) ? 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 Left leg injury MDIn MD in [place] where he is in school, q71m, sickle cell clinic. Advised to have MD in [facility]. Brief HistorySickle cell disease (HbSS)Multiple hospitalizations for acute sickle cell painAvascular necrosis of left femoral headHx acute chest syndromeHx blood transfusionOther PMHxSurgical Hx1/15	Cholecystectomy3/16	PE tubes2017	Wisdom teeth removedFMHxMedCurrent Outpatient Medications Medication Sig ? hydroxyurea (HYDREA) 500 mg capsule 3 tab daily for blood. ? ibuprofen (ADVIL,MOTRIN) 600 mg tablet Up to 4 tablets daily as needed for pain. ? oxyCODONE (ROXICODONE) 15 mg Immediate Release tablet Take 1 to 2 tablets by mouth every 4-6 hours as needed for moderate or severe pain respectively. Max 8 tabs per day. ? oxyCODONE myristate (XTAMPZA ER) 13.5 mg 12 hr extended release sprinkle capsule Take 1 capsule (13.5 mg total) by mouth every 12 (twelve) hours. Long-acting medicine to control pain. 340b. D57.1. For prior authorization Fax [phone]. ? cefuroxime (CEFTIN) 500 mg tablet Take 1 tablet (500 mg total) by mouth every 12 (twelve) hours. (Patient not taking: No sig reported) ? doxycycline hyclate (VIBRA-TABS) 100 mg tablet Take 1 tablet (100 mg total) by mouth every 12 (twelve) hours. (Patient not taking: No sig reported) ? naloxone (NARCAN) 4 mg/actuation nasal spray Use 1 spray in 1 nostril for suspected opioid overdose. May repeat in 2 minutes in other nostril with new device if minimal or no response. (Patient not taking: No sig reported) ? ondansetron (ZOFRAN-ODT) 4 mg disintegrating tablet Take 1 tablet (4 mg total) by mouth every 6 (six) hours as needed for up to 7 doses. (Patient not taking: No sig reported) ? oxyCODONE (ROXICODONE) 15 mg Immediate Release tablet 1 - 3 tablets every 3 h as needed for pain. ? senna (SENOKOT) 8.6 mg tablet Take 2 tablets (17.2 mg total) by mouth every 3 (three) hours as needed for constipation. (Patient not taking: No sig reported) No current facility-administered medications for this visit. AllergiesAllergies Allergen Reactions ? Nickel Rash  Interval Hx/ROSMitchel presents for his scheduled follow up.  He endorses sore throat, body aches x 2 days.  Denies fever, chills, SOB, recent sick contact.  He took a covid test at home which was negative. Denies nausea, vomiting. Has been having more pain in his joints.  He is using his home oral pain medication. PEBP (P) 133/78  - Pulse (P) 89  - Temp (P) 99 ?F (37.2 ?C) (Temporal)  - Resp (P) 18  - Wt (P) 62.7 kg  - SpO2 (P) 98%  - BMI (P) 17.75 kg/m?  General: alert, conversant, nadHEENT:ac/at, swollen tonsils, palpable anterior cervical lymph nodesCV:  rrrResp:  cta b/lAbdomen: soft, non-tender, bowel sounds presentPsych: normal affectInterval Lab/Imaging Latest Reference Range & Units 05/12/21 14:49 Sodium 136 - 144 mmol/L 137  Potassium 3.3 - 5.3 mmol/L 4.5 Chloride 98 - 107 mmol/L 100 CO2 20 - 30 mmol/L 25 Anion Gap 7 - 17  12 BUN 6 - 20 mg/dL 11 Creatinine 1.61 - 0.96 mg/dL 0.45 BUN/Creatinine Ratio 8.0 - 23.0  15.5 eGFR (Creatinine) >=60 mL/min/1.32m2 >60 Glucose 70 - 100 mg/dL 99 Calcium 8.8 - 40.9 mg/dL 9.2 Total Bilirubin <=8.1 mg/dL 2.4 (H) Bilirubin, Direct <=0.3 mg/dL 0.4 (H) Alkaline Phosphatase 9 - 122 U/L 85 Alanine Aminotransferase (ALT) 9 - 59 U/L 28 Aspartate Aminotransferase (AST) 10 - 35 U/L 40 (H) AST/ALT Ratio See Comment  1.4 Total Protein 6.6 - 8.7 g/dL 7.8 Albumin 3.6 - 4.9 g/dL 5.0 (H) Globulin 2.3 - 3.5 g/dL 2.8 A/G Ratio 1.0 - 2.2  1.8 Ferritin 30 - 400 ng/mL 1,402 (H) PROCALCITONIN     (BH GH LMW Q YH)  Rpt WBC 4.0 - 11.0 x1000/?L 17.7 (H) RBC 4.00 - 6.00 M/?L 2.18 (L) Hemoglobin 13.2 - 17.1 g/dL 7.8 (L) Hematocrit 19.14 - 50.00 % 22.10 (L) MCV 80.0 - 100.0 fL 101.4 (H) MCH 27.0 - 33.0 pg 35.8 (H) MCHC 31.0 - 36.0 g/dL 78.2 RDW-CV 95.6 - 21.3 % 15.8 (H) Platelets 150 - 420 x1000/?L 373 MPV 8.0 - 12.0 fL 8.9 Neutrophils 39.0 - 72.0 % 66.4 Lymphocytes 17.0 - 50.0 % 16.9 (L) Monocytes 4.0 - 12.0 % 12.9 (H) Eosinophils 0.0 - 5.0 % 2.8 Basophils 0.0 - 1.4 % 0.4 Immature Granulocytes 0.0 - 1.0 % 0.6 nRBC 0.0 - 1.0 % 0.3 ANC (Abs Neutrophil Count) 2.00 - 7.60 x 1000/?L 11.73 (H) Absolute Lymphocyte Count 0.60 - 3.70 x 1000/?L 2.99 Monocytes (Absolute) 0.00 - 1.00 x 1000/?L 2.27 (H) Eosinophil Absolute Count 0.00 - 1.00 x 1000/?L 0.50 Basophils Absolute 0.00 - 1.00 x 1000/?L 0.07 Immature Granulocytes (Abs) 0.00 - 0.30 x 1000/?L 0.10 nRBC Absolute 0.00 - 1.00 x 1000/?L 0.06 (H): Data is abnormally high(L): Data is abnormally lowRpt: View report in Results Review for more informationImp/PlanCovid-19?	Discussed 4/20.?	01/16/2020: Vaccinated x 2. ?	07/13/2020:  Eligible for 3rd dose.Sore throat11/03/2021:  Rapid strep, covid-19, dfa sent.  Rapid strep positive.  Rx  Magic mouth wash, rx penicillin V 250 mg suspension Sickle cell disease (Hbxx)?	02/18/2019: On hydroxyurea 500 mg 3 tab daily. Reluctant to increase dose. Sent names of 3 new drugs, glutamine, voxelotor, crizanlizumab. ?	03/20/2019: Read about 2 of 3 meds. No interest at this time. ?	01/16/2020?	Hydroxyurea: Taking 3 tab daily.?	Glutamine: not interested?	Voxelotor: no indication?	Crizanlizumab: not interested?	05/18/2020:  Continues hydroxyurea 3 tabs daily. Transfusion Hx?	09/11/2019: [facility]fusion [facility] in [place]. Social situation?	Lives: parents in [place]. 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 Interests: hanging out, applying for jobs?	01/16/2020: Continues to live with parents. 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 Also considering moving to [employer] (college there).  ?	03/18/2020:  Starting a new job in 1 week. ?	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 Discharged on apixaban, apparently stopped it early. 03/18/2020:  In follow up with Ortho. 05/18/2020: Would like to return back to having physical therapy, he will communicate with Orthopedic.07/13/2020:  Has follow up with Ortho tomorrow. 09/14/2020:  Scheduled for surgery on 09/21/2020. Lab work obtained today. Discussed hct goal 28-30 prior to surgery. Intermittent pain?	Typical sickle cell pain?	Oxycodone 10 mg tab and oxycodone ERT 10 mg #60 uses irregularly  but rationally for persistent pain crisis. Last dispensed 7/19 according to PMP. ?	02/18/2019: doing well on oxycodone IR 10 mg #60 and oxycodone ERT 10 mg #60 every month. ?	10/09/2019: Continue as above. No request for Rx's today.?	03/18/2020:  Continue as above. Will request prior authorization from PARS department.?	11/15/2021Marlowe Kays and oxycodone continue?	09/14/2020:  Will communicate Ortho regarding a pain management plan.Medical marijuana?	Discussed 07/17/17. Certified and accessed thereafter. ?	10/09/2019: Re-certified 3/21.?	01/16/2020: Trying to be clean.Health MaintenanceFYI: 3/10/2021Urine tox screen: OK 07/17/17.PMP: OK 3/10/2021Naloxone: in possession 3/10/2021Urine prot/creat ratio: 0.09 1/19Iron status: 643 7/17IV access: OKCigarettes: noAlcohol: occassionalEye: Sees community MD ophth Dr Luanne Bras: reports q64mVaccine/AntibodiesInfluenza: 10/2019PPV23: 10/15, 3/14PCV13: 7/12Men-4: 7/14, 7/11Men-B: 7/19Tdap: 4/21Hep A: Ab pos 1/19Hep B: sAb neg 1/19.  Dose #1 09/07/17; Dose #2 04/16/18 #3 4/07/2021Hep C: Ab neg 1/19Varicella: Ab pos 8/18. HPV: 7/19, 7/14, 8/13HIV: neg 1/19GuidelineInfluenza	Annually				Varicella	Evidence of immunity:PPV23		2 doses 5 y apart; peds doses count			Hx vaccine x 2		3rd dose age 76 					[place] born before 3		8 wks from PCV13					Hx varicella			1 mo from Men-4/Men-B 				Hx varicella zosterPCV13		1 lifetime dose						Ab pos		1 y from PPV23								1 mo from Men-4/Men-B 		Zoster		60+Men-4		2 or 3 doses depending upon vaccine		2 mo apart		1 mo from PPV23/PCV13		HPV		Women thru age 26Men-B		2 doses 2 mo apart		1 mo from PPV23/PCV13				0, 1, 4 moTdap		Every 10 years				HIV		Ab screenHep A		2 doses 6 mo apartHep B		0, 1, 4 moHep C		Ab screen onceDispositionRTC in  3 monthsJoanna Cameron, APRN

## 2021-06-09 NOTE — ED Provider Notes
HistoryChief Complaint Patient presents with ? Sickle Cell Pain   hx sickle cell. having a SCC. pt c/o back and leg pain that started this morning. pt did take his home oxycodone and ibuprofen with no relief.   25 year old male with history of sick cell disease (hx of acute chest syndrome, hx dactylitis, hx avascular necrosis of left femoral head) presenting with sickle cell pain in his low back and legs unrelieved by home oxycodone and ibuprofen. Patient also endorses recent fevers and chills as well as chest pain, unlike pain when had acute chest syndrome, and not associated with shortness of breath or syncope.Patient denies recent cough, sore throat, shortness of breath, abdominal pain, nausea, vomiting, diarrhea, dysuria, blood in urine, leg swelling, difficulty walking.The history is provided by the patient. The history is limited by the condition of the patient. No language interpreter was used. OtherThis is a new problem. The current episode started less than 1 hour ago. The problem occurs constantly. The problem has not changed since onset.Associated symptoms include chest pain. Pertinent negatives include no abdominal pain, no headaches and no shortness of breath. Nothing aggravates the symptoms. Nothing relieves the symptoms. He has tried nothing for the symptoms. The treatment provided no relief.  Past Medical History: Diagnosis Date ? Aplastic crisis (HC Code) (HC CODE) (HC Code) 6/6/ 2005 transfusion ? Avascular necrosis of femur head, left (HC Code) (HC CODE) (HC Code)  ? Chronic pain   sickle cell ? Conductive hearing loss 09/22/14 P.E tubes placed ? Dactylitis one episode  April, 1998 ? GERD (gastroesophageal reflux disease)  ? 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 On hydroxyurea therapy started October 2013 ? Pneumococcal vaccination given 01/20/2006 and 09/28/2012 ? 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 Vasoocclusive sickle cell crisis (HC Code) (HC CODE) (HC Code) Adm 01/02/2012, ER 04/08/12, 4/1-10/03/12, 7/2-01/05/13, 12/02/13-12/03/13 , March 2016 - 2 admissions Past Surgical History: Procedure Laterality Date ? CHOLECYSTECTOMY, LAPAROSCOPIC  1/15 ? TYMPANOSTOMY TUBE PLACEMENT  09/22/2014  by Dr. Bernita Raisin Family History Problem Relation Age of Onset ? Sickle cell trait Mother  ? Thyroid disease Mother  ? Sickle cell trait Father  ? Sarcoidosis Father  ? Diabetes Paternal Grandfather  ? Hypertension Paternal Grandfather  ? Alcohol abuse Maternal Aunt  Social History Socioeconomic History ? Marital status: Single Tobacco Use ? Smoking status: Never ? Smokeless tobacco: Never Substance and Sexual Activity ? Alcohol use: Yes   Comment: beer and liquor on occasion ? Drug use: Not Currently   Types: Marijuana   Comment: last used on Tuesday ? Sexual activity: Yes Social History Narrative  Lives in [place].    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  Will get a Masters, MBA in business  [facility] Other Social History E-cigarette/Vaping Substances E-cigarette/Vaping Devices Review of Systems Constitutional: Positive for chills. Negative for activity change and fever. HENT: Negative for sore throat and trouble swallowing.  Eyes: Negative for photophobia and visual disturbance. Respiratory: Positive for chest tightness. Negative for cough, choking, shortness of breath and wheezing.  Cardiovascular: Positive for chest pain. Negative for palpitations and leg swelling. Gastrointestinal: Negative for abdominal pain, blood in stool, constipation, diarrhea, nausea and vomiting. Genitourinary: Negative for decreased urine volume, difficulty urinating, dysuria, flank pain, penile pain, penile swelling and urgency. Musculoskeletal: Positive  for back pain. Negative for arthralgias, gait problem, joint swelling, neck pain and neck stiffness. Skin: Negative for rash and wound. Neurological: Negative for dizziness, tremors, seizures, syncope, facial asymmetry, speech difficulty, weakness, light-headedness, numbness and headaches.  Physical ExamED Triage Vitals [05/17/21 1547]BP: (!) 143/70Pulse: 63Pulse from  O2 sat: n/aResp: 20Temp: 97.8 ?F (36.6 ?C)Temp src: OralSpO2: 100 % BP 120/73  - Pulse 85  - Temp 99 ?F (37.2 ?C)  - Resp 16  - Ht 6' 2 (1.88 m)  - Wt 62.7 kg  - SpO2 98%  - BMI 17.76 kg/m? Physical ExamVitals and nursing note reviewed. Constitutional:     General: He is in acute distress.    Appearance: He is not ill-appearing, toxic-appearing or diaphoretic. HENT:    Head: Normocephalic and atraumatic.    Right Ear: External ear normal.    Left Ear: External ear normal.    Nose: No congestion or rhinorrhea.    Mouth/Throat:    Mouth: Mucous membranes are moist. Eyes:    General: No scleral icterus.   Extraocular Movements: Extraocular movements intact.    Conjunctiva/sclera: Conjunctivae normal.    Pupils: Pupils are equal, round, and reactive to light. Cardiovascular:    Rate and Rhythm: Normal rate and regular rhythm.    Pulses: Normal pulses.    Heart sounds: No murmur heard.Pulmonary:    Effort: Pulmonary effort is normal. No respiratory distress.    Breath sounds: No stridor. No wheezing. Abdominal:    General: Abdomen is flat. There is no distension.    Palpations: Abdomen is soft.    Tenderness: There is no abdominal tenderness. There is no right CVA tenderness, left CVA tenderness, guarding or rebound. Musculoskeletal:       General: No deformity.    Right lower leg: No edema.    Left lower leg: No edema. Skin:   General: Skin is warm and dry.    Capillary Refill: Capillary refill takes less than 2 seconds.    Coloration: Skin is not pale.    Findings: No erythema or rash. Neurological:    General: No focal deficit present.    Mental Status: He is alert and oriented to person, place, and time. Mental status is at baseline. Psychiatric:       Behavior: Behavior normal.       Thought Content: Thought content normal.  ProceduresProceduresResident/APP MDM:Ddx: sickle cell pain crisis, acs, pe, nephrolithiasisACS is less likely at this time given patient is not endorsing chest pain worsened by activity, ecg does not show any ischemic changes compared to prior, and troponin level is low. Acute chest syndrome is less likely at this time given patient is afebrile, does not have cough or tachypnea, no infiltrates on CXR. Pulmonary embolism is less likely at this time given patient does not have dyspnea, lower extremity swelling, tachycardia, decreased breath sounds, changes on ecg tracing and Well's PE score is 0.Patient's pain only moderately controlled in the emergency department. Patient will be admitted for pain control and further workup and management. Plan of care discussed with patient.ED COURSEPatient Reevaluation: Attending Supervised: ResidentI saw and examined the patient. I agree with the findings and plan of care as documented in the resident's note. Of note, patient w sickle cell disease c/b acute chest and avascular necrosis who presents with one day of CP, back pain, b/l leg pain, and subjective fever. Says CP does not feel like prior acute chest. Denies SOB, cough.- sickle cell labs inc retic count, trops, lfts, CXR, ECG- pain  control per planAfter initial ED pain control plan, patient reports ongoing diffuse pain, greatest in lower back. Does not feel pain is under control enough to manage at home.Admitted for further pain control for sickle cell crisis.Ernestine Mcmurray FoxClinical Impressions as of 06/08/21 1947 Sickle cell pain crisis (HC Code) (HC CODE) (HC Code) Back pain, unspecified back location, unspecified back pain laterality, unspecified chronicity Chest pain, unspecified type  ED DispositionAdmit Lorne Skeens, MD11/14/22 1818 Awilda Metro, MDResident11/14/22 1836 Lorne Skeens, MD12/06/22 [phone]

## 2021-06-10 ENCOUNTER — Telehealth
Admit: 2021-06-10 | Payer: PRIVATE HEALTH INSURANCE | Attending: Family | Primary: Student in an Organized Health Care Education/Training Program

## 2021-06-10 ENCOUNTER — Encounter
Admit: 2021-06-10 | Payer: PRIVATE HEALTH INSURANCE | Attending: Family | Primary: Student in an Organized Health Care Education/Training Program

## 2021-06-10 DIAGNOSIS — D571 Sickle-cell disease without crisis: Secondary | ICD-10-CM

## 2021-06-10 MED ORDER — OXYCODONE IMMEDIATE RELEASE 15 MG TABLET
15 mg | ORAL_TABLET | 1 refills | Status: AC
Start: 2021-06-10 — End: 2021-06-29

## 2021-06-10 NOTE — Telephone Encounter
Sent to provider for review. Last refills were 05/28/21

## 2021-06-10 NOTE — Telephone Encounter
Patient requesting refill on Oxycodone 15 mg be [facility] to [facility] [address] [facility] in [place]. States this may be an early refill as he has been having excessive pain since sickle cell crisis and ER visit on 11/14. Requesting call back to discuss medication options.

## 2021-06-22 ENCOUNTER — Encounter
Admit: 2021-06-22 | Payer: PRIVATE HEALTH INSURANCE | Attending: Orthopaedic Trauma | Primary: Student in an Organized Health Care Education/Training Program

## 2021-06-29 ENCOUNTER — Encounter
Admit: 2021-06-29 | Payer: PRIVATE HEALTH INSURANCE | Attending: Family | Primary: Student in an Organized Health Care Education/Training Program

## 2021-06-29 ENCOUNTER — Telehealth
Admit: 2021-06-29 | Payer: PRIVATE HEALTH INSURANCE | Attending: Family | Primary: Student in an Organized Health Care Education/Training Program

## 2021-06-29 DIAGNOSIS — D571 Sickle-cell disease without crisis: Secondary | ICD-10-CM

## 2021-06-29 MED ORDER — HYDROXYUREA 500 MG CAPSULE
500 mg | ORAL_CAPSULE | 3 refills | Status: AC
Start: 2021-06-29 — End: 2021-07-28

## 2021-06-29 MED ORDER — OXYCODONE ER 13.5 MG CAPSULE SPRINKLE EXTEND RELEASE 12HR(DON'T CRUSH)
13.5 mg | ORAL_CAPSULE | Freq: Two times a day (BID) | ORAL | 1 refills | Status: AC
Start: 2021-06-29 — End: 2021-09-14

## 2021-06-29 MED ORDER — IBUPROFEN 600 MG TABLET
600 mg | ORAL_TABLET | 2 refills | Status: AC
Start: 2021-06-29 — End: 2021-08-12

## 2021-06-29 MED ORDER — OXYCODONE IMMEDIATE RELEASE 15 MG TABLET
15 mg | ORAL_TABLET | 1 refills | Status: DC
Start: 2021-06-29 — End: 2021-07-10

## 2021-06-29 NOTE — Telephone Encounter
Patient requesting refills on Hydroxyurea 500 mg, Oxycodone 15 mg, Xtampza 13.5 m, and Ibuprofen 600 mg be [facility] to [facility] on [address] in [place]

## 2021-07-09 ENCOUNTER — Emergency Department
Admit: 2021-07-09 | Payer: BLUE CROSS/BLUE SHIELD | Primary: Student in an Organized Health Care Education/Training Program

## 2021-07-09 ENCOUNTER — Inpatient Hospital Stay
Admit: 2021-07-09 | Discharge: 2021-07-10 | Payer: BLUE CROSS/BLUE SHIELD | Attending: Internal Medicine | Primary: Student in an Organized Health Care Education/Training Program

## 2021-07-09 ENCOUNTER — Encounter
Admit: 2021-07-09 | Payer: PRIVATE HEALTH INSURANCE | Attending: Student in an Organized Health Care Education/Training Program | Primary: Student in an Organized Health Care Education/Training Program

## 2021-07-09 ENCOUNTER — Inpatient Hospital Stay: Admit: 2021-07-09 | Payer: BLUE CROSS/BLUE SHIELD

## 2021-07-09 DIAGNOSIS — Z23 Encounter for immunization: Secondary | ICD-10-CM

## 2021-07-09 DIAGNOSIS — D571 Sickle-cell disease without crisis: Secondary | ICD-10-CM

## 2021-07-09 DIAGNOSIS — D5700 Hb-SS disease with crisis, unspecified: Secondary | ICD-10-CM

## 2021-07-09 DIAGNOSIS — H902 Conductive hearing loss, unspecified: Secondary | ICD-10-CM

## 2021-07-09 DIAGNOSIS — M87052 Idiopathic aseptic necrosis of left femur: Secondary | ICD-10-CM

## 2021-07-09 DIAGNOSIS — Z7964 On hydroxyurea therapy: Secondary | ICD-10-CM

## 2021-07-09 DIAGNOSIS — D7389 Other diseases of spleen: Secondary | ICD-10-CM

## 2021-07-09 DIAGNOSIS — G8929 Other chronic pain: Secondary | ICD-10-CM

## 2021-07-09 DIAGNOSIS — D6189 Other specified aplastic anemias and other bone marrow failure syndromes: Secondary | ICD-10-CM

## 2021-07-09 DIAGNOSIS — IMO0002 Dactylitis: Secondary | ICD-10-CM

## 2021-07-09 DIAGNOSIS — K219 Gastro-esophageal reflux disease without esophagitis: Secondary | ICD-10-CM

## 2021-07-09 LAB — CBC WITH AUTO DIFFERENTIAL
BKR WAM ABSOLUTE NRBC (2 DEC): 0.98 x 1000/ÂµL (ref 0.00–1.00)
BKR WAM HEMATOCRIT (2 DEC): 20.9 % — ABNORMAL LOW (ref 38.50–50.00)
BKR WAM HEMOGLOBIN: 7.4 g/dL — ABNORMAL LOW (ref 13.2–17.1)
BKR WAM MCH (PG): 34.7 pg — ABNORMAL HIGH (ref 27.0–33.0)
BKR WAM MCHC: 35.4 g/dL (ref 31.0–36.0)
BKR WAM MCV: 98.1 fL (ref 80.0–100.0)
BKR WAM MPV: 8.9 fL (ref 8.0–12.0)
BKR WAM NUCLEATED RED BLOOD CELLS: 6.5 % — ABNORMAL HIGH (ref 0.0–1.0)
BKR WAM PLATELETS: 576 x1000/ÂµL — ABNORMAL HIGH (ref 150–420)
BKR WAM RDW-CV: 20.5 % — ABNORMAL HIGH (ref 11.0–15.0)
BKR WAM RED BLOOD CELL COUNT.: 2.13 M/ÂµL — ABNORMAL LOW (ref 4.00–6.00)
BKR WAM WHITE BLOOD CELL COUNT: 15.2 x1000/ÂµL — ABNORMAL HIGH (ref 4.0–11.0)

## 2021-07-09 LAB — BASIC METABOLIC PANEL
BKR ANION GAP: 13 (ref 7–17)
BKR BLOOD UREA NITROGEN: 12 mg/dL (ref 6–20)
BKR BUN / CREAT RATIO: 15.6 (ref 8.0–23.0)
BKR CALCIUM: 9.5 mg/dL (ref 8.8–10.2)
BKR CHLORIDE: 98 mmol/L (ref 98–107)
BKR CO2: 23 mmol/L (ref 20–30)
BKR CREATININE: 0.77 mg/dL (ref 0.40–1.30)
BKR EGFR, CREATININE (CKD-EPI 2021): >60 mL/min/{1.73_m2} (ref >=60)
BKR GLUCOSE: 122 mg/dL — ABNORMAL HIGH (ref 70–100)
BKR POTASSIUM: 4.4 mmol/L (ref 3.3–5.3)
BKR SODIUM: 134 mmol/L — ABNORMAL LOW (ref 136–144)

## 2021-07-09 LAB — HEPATIC FUNCTION PANEL
BKR A/G RATIO: 1.3 (ref 1.0–2.2)
BKR ALANINE AMINOTRANSFERASE (ALT): 155 U/L — ABNORMAL HIGH (ref 9–59)
BKR ALBUMIN: 4.4 g/dL (ref 3.6–4.9)
BKR ALKALINE PHOSPHATASE: 199 U/L — ABNORMAL HIGH (ref 9–122)
BKR ASPARTATE AMINOTRANSFERASE (AST): 157 U/L — ABNORMAL HIGH (ref 10–35)
BKR AST/ALT RATIO: 1
BKR BILIRUBIN DIRECT: 1.4 mg/dL — ABNORMAL HIGH (ref <=0.3)
BKR BILIRUBIN TOTAL: 3.5 mg/dL — ABNORMAL HIGH (ref <=1.2)
BKR GLOBULIN: 3.4 g/dL (ref 2.3–3.5)
BKR PROTEIN TOTAL: 7.8 g/dL (ref 6.6–8.7)

## 2021-07-09 LAB — URINALYSIS WITH CULTURE REFLEX      (BH LMW YH)
BKR BILIRUBIN, UA: NEGATIVE
BKR BLOOD, UA: NEGATIVE
BKR GLUCOSE, UA: NEGATIVE
BKR KETONES, UA: NEGATIVE
BKR LEUKOCYTE ESTERASE, UA: NEGATIVE
BKR NITRITE, UA: NEGATIVE
BKR PH, UA: 6 (ref 5.5–7.5)
BKR PROTEIN, UA: NEGATIVE
BKR SPECIFIC GRAVITY, UA: 1.012 (ref 1.005–1.030)
BKR UROBILINOGEN, UA (MG/DL): <2 mg/dL (ref <=2.0)

## 2021-07-09 LAB — MANUAL DIFFERENTIAL
BKR WAM ATYPICAL LYMPHOCYTES (DIFF) 1 DEC: 6 % — ABNORMAL HIGH (ref 0.0–1.0)
BKR WAM BASOPHIL - ABS (DIFF) 2 DEC: 0.15 x 1000/ÂµL (ref 0.00–1.00)
BKR WAM BASOPHILS (DIFF): 1 % (ref 0.0–1.4)
BKR WAM EOSINOPHILS (DIFF) 2 DEC: 0 x 1000/ÂµL (ref 0.00–1.00)
BKR WAM EOSINOPHILS (DIFF): 0 % (ref 0.0–5.0)
BKR WAM LYMPHOCYTE - ABS (DIFF) 2 DEC: 5.17 x 1000/ÂµL — ABNORMAL HIGH (ref 0.60–3.70)
BKR WAM LYMPHOCYTES (DIFF): 28 % (ref 17.0–50.0)
BKR WAM MONOCYTE - ABS (DIFF) 2 DEC: 0.76 x 1000/ÂµL (ref 0.00–1.00)
BKR WAM MONOCYTES (DIFF): 5 % (ref 4.0–12.0)
BKR WAM MYELOCYTES (DIFF) 1 DEC: 1 % — ABNORMAL HIGH (ref 0.0–0.0)
BKR WAM NEUTROPHILS (DIFF): 59 % (ref 39.0–72.0)
BKR WAM NEUTROPHILS - ABS (DIFF) 2 DEC: 8.97 x 1000/ÂµL — ABNORMAL HIGH (ref 2.00–7.60)
BKR WAM NUCLEATED RED BLOOD CELLS (DIFF) 1 DEC: 10 /100 — ABNORMAL HIGH (ref 0.0–1.0)

## 2021-07-09 LAB — UA REFLEX CULTURE

## 2021-07-09 LAB — RETICULOCYTES
BKR WAM IRF: 27.1 % — ABNORMAL HIGH (ref 3.0–15.9)
BKR WAM RETICULOCYTE - ABS (3 DEC): 0.165 10Ë6 cells/uL — ABNORMAL HIGH (ref 0.023–0.140)
BKR WAM RETICULOCYTE COUNT PCT (1 DEC): 7.7 % — ABNORMAL HIGH (ref 0.6–2.7)
BKR WAM RETICULOCYTE HGB EQUIVALENT: 36.7 pg — ABNORMAL HIGH (ref 28.2–35.7)

## 2021-07-09 LAB — SARS COV-2 (COVID-19) RNA: BKR SARS-COV-2 RNA (COVID-19) (YH): NEGATIVE

## 2021-07-09 LAB — ZZZMRSA BY PCR- VANCO RX (PHARMACY USE ONLY) (YH): BKR MRSA COLONIZATION STATUS PCR: NOT DETECTED

## 2021-07-09 MED ORDER — LIDOCAINE 5 % ADHESIVE PATCH
5 % | TRANSDERMAL | Status: DC
Start: 2021-07-09 — End: 2021-07-09

## 2021-07-09 MED ORDER — ACETAMINOPHEN 325 MG TABLET
325 mg | Freq: Three times a day (TID) | ORAL | Status: DC | PRN
Start: 2021-07-09 — End: 2021-07-11

## 2021-07-09 MED ORDER — HYDROXYUREA 500 MG CAPSULE
500 mg | Freq: Every day | ORAL | Status: DC
Start: 2021-07-09 — End: 2021-07-11
  Administered 2021-07-10: 500 mg via ORAL

## 2021-07-09 MED ORDER — SODIUM CHLORIDE 0.9 % BOLUS (NEW BAG)
0.9 % | Freq: Once | INTRAVENOUS | Status: CP
Start: 2021-07-09 — End: ?
  Administered 2021-07-09: 19:00:00 0.9 mL/h via INTRAVENOUS

## 2021-07-09 MED ORDER — ONDANSETRON HCL (PF) 4 MG/2 ML INJECTION SOLUTION
4 mg/2 mL | Freq: Four times a day (QID) | INTRAVENOUS | Status: DC | PRN
Start: 2021-07-09 — End: 2021-07-11

## 2021-07-09 MED ORDER — PIPERACILLIN-TAZOBACTAM (ZOSYN) 4.5GM MBP
Freq: Four times a day (QID) | INTRAVENOUS | Status: DC
Start: 2021-07-09 — End: 2021-07-09

## 2021-07-09 MED ORDER — SODIUM CHLORIDE 0.9 % INTRAVENOUS SOLUTION
INTRAVENOUS | Status: DC
Start: 2021-07-09 — End: 2021-07-11

## 2021-07-09 MED ORDER — ENOXAPARIN 40 MG/0.4 ML SUBCUTANEOUS SYRINGE
40 mg/0.4 mL | SUBCUTANEOUS | Status: DC
Start: 2021-07-09 — End: 2021-07-10

## 2021-07-09 MED ORDER — NALOXONE 0.4 MG/ML INJECTION SOLUTION
0.4 mg/mL | INTRAVENOUS | Status: DC | PRN
Start: 2021-07-09 — End: 2021-07-11

## 2021-07-09 MED ORDER — KETOROLAC 30 MG/ML (1 ML) INJECTION SOLUTION
301 mg/mL (1 mL) | INTRAVENOUS | Status: DC | PRN
Start: 2021-07-09 — End: 2021-07-09

## 2021-07-09 MED ORDER — OXYCODONE ER 15 MG TABLET,CRUSH RESISTANT,EXTENDED RELEASE 12 HR
15 mg | Freq: Two times a day (BID) | ORAL | Status: DC
Start: 2021-07-09 — End: 2021-07-11
  Administered 2021-07-10 (×2): 15 mg via ORAL

## 2021-07-09 MED ORDER — MELATONIN 3 MG TABLET
3 mg | Freq: Every evening | ORAL | Status: DC | PRN
Start: 2021-07-09 — End: 2021-07-11

## 2021-07-09 MED ORDER — VANCOMYCIN 1 G IN 250 ML IVPB (VIALMATE)
Freq: Two times a day (BID) | INTRAVENOUS | Status: DC
Start: 2021-07-09 — End: 2021-07-09
  Administered 2021-07-09: 22:00:00 250.000 mL/h via INTRAVENOUS

## 2021-07-09 MED ORDER — HYDROMORPHONE (DILAUDID) PCA 1 MG/ML (50 ML) YNH PYXIS
1 mg/ml | INTRAVENOUS | Status: DC
Start: 2021-07-09 — End: 2021-07-11
  Administered 2021-07-10: 03:00:00 1 mL via INTRAVENOUS

## 2021-07-09 MED ORDER — SODIUM CHLORIDE 0.9 % (FLUSH) INJECTION SYRINGE
0.9 % | INTRAVENOUS | Status: DC | PRN
Start: 2021-07-09 — End: 2021-07-11

## 2021-07-09 MED ORDER — ONDANSETRON 4 MG DISINTEGRATING TABLET
4 mg | Freq: Four times a day (QID) | ORAL | Status: DC | PRN
Start: 2021-07-09 — End: 2021-07-11

## 2021-07-09 MED ORDER — LIDOCAINE 5 % ADHESIVE PATCH
5 % | TRANSDERMAL | Status: DC
Start: 2021-07-09 — End: 2021-07-11

## 2021-07-09 MED ORDER — PIPERACILLIN-TAZOBACTAM (ZOSYN) 4.5GM MBP - ED FIRST DOSE
Freq: Once | INTRAVENOUS | Status: CP
Start: 2021-07-09 — End: ?
  Administered 2021-07-09: 20:00:00 100.000 mL/h via INTRAVENOUS

## 2021-07-09 MED ORDER — HYDROMORPHONE 2 MG/ML INJECTION SOLUTION
2 mg/mL | INTRAVENOUS | Status: CP
Start: 2021-07-09 — End: ?
  Administered 2021-07-09 (×3): 2 mL via INTRAVENOUS

## 2021-07-09 MED ORDER — KETOROLAC 30 MG/ML (1 ML) INJECTION SOLUTION
30 mg/mL (1 mL) | Freq: Four times a day (QID) | INTRAVENOUS | Status: DC | PRN
Start: 2021-07-09 — End: 2021-07-11
  Administered 2021-07-10 (×2): 30 mL via INTRAVENOUS

## 2021-07-09 MED ORDER — VANCOMYCIN 750 MG IN 250 ML IVPB (VIALMATE)
Freq: Three times a day (TID) | INTRAVENOUS | Status: DC
Start: 2021-07-09 — End: 2021-07-10

## 2021-07-09 MED ORDER — SODIUM CHLORIDE 0.9 % (FLUSH) INJECTION SYRINGE
0.9 % | Freq: Three times a day (TID) | INTRAVENOUS | Status: DC
Start: 2021-07-09 — End: 2021-07-11

## 2021-07-09 MED ORDER — KETOROLAC 30 MG/ML (1 ML) INJECTION SOLUTION
30 mg/mL (1 mL) | Freq: Once | INTRAVENOUS | Status: CP
Start: 2021-07-09 — End: ?
  Administered 2021-07-09: 18:00:00 30 mL via INTRAVENOUS

## 2021-07-09 MED ORDER — IBUPROFEN 600 MG TABLET
600 mg | Freq: Four times a day (QID) | ORAL | Status: DC
Start: 2021-07-09 — End: 2021-07-11

## 2021-07-09 MED ORDER — ACETAMINOPHEN 325 MG TABLET
325 mg | Freq: Once | ORAL | Status: CP
Start: 2021-07-09 — End: ?
  Administered 2021-07-09: 19:00:00 325 mg via ORAL

## 2021-07-09 MED ORDER — PIPERACILLIN-TAZOBACTAM (ZOSYN) 4.5GM MBP
Freq: Four times a day (QID) | INTRAVENOUS | Status: DC
Start: 2021-07-09 — End: 2021-07-11
  Administered 2021-07-10 (×3): 100.000 mL/h via INTRAVENOUS

## 2021-07-09 MED ORDER — KETOROLAC 30 MG/ML (1 ML) INJECTION SOLUTION
30 mg/mL (1 mL) | Freq: Once | INTRAVENOUS | Status: CP
Start: 2021-07-09 — End: ?
  Administered 2021-07-09: 22:00:00 30 mL via INTRAVENOUS

## 2021-07-09 MED ORDER — SODIUM CHLORIDE 0.45 % INTRAVENOUS SOLUTION
0.45 % | INTRAVENOUS | Status: AC
Start: 2021-07-09 — End: ?
  Administered 2021-07-09: 0.45 mL/h via INTRAVENOUS

## 2021-07-09 MED ORDER — OXYCODONE IMMEDIATE RELEASE 5 MG TABLET
5 mg | Freq: Once | ORAL | Status: CP
Start: 2021-07-09 — End: ?
  Administered 2021-07-09: 22:00:00 5 mg via ORAL

## 2021-07-09 NOTE — Utilization Review (ED)
UM Status: Commercial - IP, sickle cell pain crisis, IV analgesia via PCA, fever, IV Abx.

## 2021-07-09 NOTE — ED Notes
5:11 PM Report received from Emily, [place]. Pt here for sickle cell crisis, found to be febrile. Working up infectious source, IV vanc infusing.  Waiting for PCA pump for pain control.  Pt comfortable on stretcher, RR 16, even and unlabored.  BP 113/64  - Pulse (!) 128  - Temp (!) 100.9 ?F (38.3 ?C) (Oral)  - Resp 16  - SpO2 95% 6:20 PM Pt to [place] with transport.

## 2021-07-09 NOTE — ED Notes
12:57 PM Pt arrives to ED for evaluation of sickle cell pain. Pt reports pain starting 2 days ago, to lower back, legs, and ribs. Pt reports he is having a sickle cell crisis. Pt denies triggers to crisis. Denies fevers/ chills. Speaking in full complete sentences. AxOx4. PIV established, labs collected and sent.  2:28 PM Pt with temp of 101.7, MD Khandjian made aware, pt medicated per MAR. 2:45 PM Pt to imaging with transport staff. 3:23 PM Pt medicated per [facility], informed of admission.3:34 PM PCA pump ordered, confirmation number- [id number]. 5:16 PM Report given to RN Alan Ripper, pt transferred.

## 2021-07-09 NOTE — Other
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 Otherwise the patient will be seen normal business hours, on the next non-weekend, non-holiday day.Trudee Grip IV, PCT1/6/20234:59 [employer] [phone]

## 2021-07-09 NOTE — Other
[facility] HospitalHospitalist Medicine Progress NoteAttending: Jari Pigg, MDLocation:  B04/B04Length of stay: [place] Concern: Sickle Cell CrisisExam time: 07/09/2021, 3:47 PMSubjectiveMitchel Bell is a 26 y.o. man with HbSS c/b acute chest and L femoral head AV who presented with acute pain.Patient reports that he was in his usual state of health since yesterday. He noted new mid-thoracic back pain and left leg pain. This feels similar to prior vaso-occlusive episodes that he has had in the past. He began taking his as-needed oxycodone 15, but the pain worsened significantly. This morning, he ran out of medications and presented to the Emergency Department for evaluation and pain management. He notes that his pain is the same as when he came in. He was given torodol for his pain in the ED because his PCA pump, which is part of his pain plan, has not yet arrived. All other systems were reviewed and are negative.Medical historyHe has a past medical history of Aplastic crisis (HC Code) (HC CODE) (HC Code) (6/6/ 2005 transfusion), Avascular necrosis of femur head, left (HC Code) (HC CODE) (HC Code), Chronic pain, Conductive hearing loss (09/22/14 P.E tubes placed), Dactylitis (one episode  April, 1998), GERD (gastroesophageal reflux disease), Hemoglobin S-S disease (HC Code) (HC CODE) (HC Code) (08/06/2010 Hb Electrophoresis: Hb S = 88.2%, HbF= 3.9%, Hb A2= 3.7%), On hydroxyurea therapy (started October 2013), Pneumococcal vaccination given (01/20/2006 and 09/28/2012), 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), and Vasoocclusive sickle cell crisis (HC Code) (HC CODE) (HC Code) (Adm 01/02/2012, ER 04/08/12, 4/1-10/03/12, 7/2-01/05/13, 12/02/13-12/03/13 , March 2016 - 2 admissions). Surgical historyHe has a past surgical history that includes Cholecystectomy, laparoscopic; Tympanostomy tube placement; Open reduction internal fixaton left femur nounion (Left); SURGICAL STABILIZATION OF LEFT FEMUR (Left); EXTRACTION OF 3,08,65,78 (Bilateral); and BILATERAL PRESSURE EQUALIZATION TUBE PLACEMENT (Bilateral). Family historyHis family history includes Alcohol abuse in his maternal aunt; Diabetes in his paternal grandfather; Hypertension in his paternal grandfather; Sarcoidosis in his father; Sickle cell trait in his father and mother; Thyroid disease in his mother. AllergiesHe is allergic to nickel. Social historyHe reports that he has never smoked. He has never used smokeless tobacco.He reports current alcohol use.He reports that he does not currently use drugs after having used the following drugs: Marijuana.   ObjectiveVitals: BP 113/64  - Pulse (!) 128  - Temp (!) 100.9 ?F (38.3 ?C) (Oral)  - Resp 16  - Wt 62.7 kg  - SpO2 95%  - BMI 17.75 kg/m? Constitutional: well-appearing, no acute distressCardiovascular: RRR, soft systolic murmur, nl S1/S2Respiratory: CTABL, no respiratory distress, breathing well on room airGastrointestinal: abdomen was soft and non-tender to palpation in four quadrants, nl BSNeurologic: alert and oriented x3, no FNDEyes: clear sclera, EOMI and without pain, pupils equal / round / reactive to lightENMT: moist mucus membranesSkin: no rashes or ulcers appreciatedMusculoskeletal: tenderness over the thoracic spine with tenderness along 8th / 9th ribsPsychiatric: linear thought pattern, euthymic, engaged in careHematologic / lymph / immunologic: no LAD, no petechiae / purpuraI have reviewed the patient's labs within the last 24 hrs.I have reviewed the patient's imaging within the past 24 hours. I have reviewed and summarized this patient's old records in the chart above.Assessment / PlanMitchel Bell is a 27 y.o. man with HbSS c/b acute chest and L femoral head AV who presented with a vaso-occlusive crisis. Unclear precipitant, suspect acute infection. # Sepsis- febrile with leukocytosis and transaminitis on admission- patient reports no localizing symptoms- no RUQ abdominal pain on admission exam- UA benign; fu blood cultures, RUQ U/S, RVP, hepatitis panel, MRSA swab-  cw vanc / zosyn- CBC, CMP, INR tomorrow# L leg pain- fu L leg U/S# Sickle cell disease with vaso-occlusive crisis- appreciate hematology consult and recommendations- CXR without infiltrates and SpO2 stable on room air- cw PCA pump: 0.5 q10 min, 1 hour lock out: 3 mg- cw oral tier: oxycontine 15 bid- cw hydroxyurea- give 1/2 NS for 16 hours- hold oxycodone 15 q3 prn- transfuse for hematocrit <15- head / cold packs as needed- lidocaine to back and ribs# Discharge- patient is independent- patient is out of his pain medications at home + notes that he has insurance auth issues with his long acting oxycodone. Reach out to primary hematologist prior to discharge for outpatient pain med refills.# Pain plan:26 y.o. man with sickle cell disease (HbSS) and occasional pain. Always Dx/Rx for problems other than pain. The patient has been told that IV Benadryl is being phased out in the ED. Transfuse for Hct < 15 or symptoms such as increase in shortness of breath or easy fatigue (pain is not a symptom for which we transfuse). For pain in the ED recommend ketorolac 15 mg IV x 1 and hydromorphone 1 mg IV/SC q30 min x 3 and then evaluate for discharge or admission. If plan for admission, recommend PCA hydromorphone 0.5 q10 min 1 h lock out 3 mg. Continue ketorolac 15 mg IV x total 20 doses. Offer lidocaine patches, hot/cold packs, APAP. Pruritis, nausea uncommon in this patient. Delphia Grates, MDSignedAliza Adriana Simas, MDAttending HospitalistNEMG Wright [facility] HospitalPreferred contact: MHB1/12/2021, 3:47 PM----Code Status: Full ACLS DVT prophylaxis: enoxaparinLast AMPAC score:  Expected discharge timeframe: TBDExpected discharge location: home

## 2021-07-10 ENCOUNTER — Telehealth
Admit: 2021-07-10 | Payer: PRIVATE HEALTH INSURANCE | Attending: Family | Primary: Student in an Organized Health Care Education/Training Program

## 2021-07-10 ENCOUNTER — Encounter
Admit: 2021-07-10 | Payer: PRIVATE HEALTH INSURANCE | Attending: Internal Medicine | Primary: Student in an Organized Health Care Education/Training Program

## 2021-07-10 DIAGNOSIS — H902 Conductive hearing loss, unspecified: Secondary | ICD-10-CM

## 2021-07-10 DIAGNOSIS — A419 Sepsis, unspecified organism: Secondary | ICD-10-CM

## 2021-07-10 DIAGNOSIS — Z9109 Other allergy status, other than to drugs and biological substances: Secondary | ICD-10-CM

## 2021-07-10 DIAGNOSIS — Z91048 Other nonmedicinal substance allergy status: Secondary | ICD-10-CM

## 2021-07-10 DIAGNOSIS — R7401 Elevation of levels of liver transaminase levels: Secondary | ICD-10-CM

## 2021-07-10 DIAGNOSIS — G8929 Other chronic pain: Secondary | ICD-10-CM

## 2021-07-10 DIAGNOSIS — Z20822 Contact with and (suspected) exposure to covid-19: Secondary | ICD-10-CM

## 2021-07-10 DIAGNOSIS — M79605 Pain in left leg: Secondary | ICD-10-CM

## 2021-07-10 DIAGNOSIS — D5700 Hb-SS disease with crisis, unspecified: Secondary | ICD-10-CM

## 2021-07-10 DIAGNOSIS — Z79899 Other long term (current) drug therapy: Secondary | ICD-10-CM

## 2021-07-10 DIAGNOSIS — K219 Gastro-esophageal reflux disease without esophagitis: Secondary | ICD-10-CM

## 2021-07-10 DIAGNOSIS — R5081 Fever presenting with conditions classified elsewhere: Secondary | ICD-10-CM

## 2021-07-10 DIAGNOSIS — D571 Sickle-cell disease without crisis: Secondary | ICD-10-CM

## 2021-07-10 LAB — RESPIRATORY VIRUS PCR PANEL  (YH VERIGENE)(LAB ORDER ONLY)
BKR ADENOVIRUS: NEGATIVE
BKR HUMAN METAPNEUMOVIRUS (HMPV): NEGATIVE
BKR INFLUENZA A: NEGATIVE
BKR INFLUENZA B: NEGATIVE
BKR PARAINFLUENZA VIRUS 1: NEGATIVE
BKR PARAINFLUENZA VIRUS 2: NEGATIVE
BKR PARAINFLUENZA VIRUS 3: NEGATIVE
BKR PARAINFLUENZA VIRUS 4: NEGATIVE
BKR RESPIRATORY SYNCYTIAL VIRUS: NEGATIVE
BKR RHINOVIRUS: NEGATIVE

## 2021-07-10 LAB — CBC WITH AUTO DIFFERENTIAL
BKR WAM ABSOLUTE IMMATURE GRANULOCYTES.: 0.12 x 1000/ÂµL (ref 0.00–0.30)
BKR WAM ABSOLUTE LYMPHOCYTE COUNT.: 6.99 x 1000/ÂµL — ABNORMAL HIGH (ref 0.60–3.70)
BKR WAM ABSOLUTE NRBC (2 DEC): 1.02 x 1000/ÂµL — ABNORMAL HIGH (ref 0.00–1.00)
BKR WAM ANALYZER ANC: 5.43 x 1000/ÂµL (ref 2.00–7.60)
BKR WAM BASOPHIL ABSOLUTE COUNT.: 0.07 x 1000/ÂµL (ref 0.00–1.00)
BKR WAM BASOPHILS: 0.5 % (ref 0.0–1.4)
BKR WAM EOSINOPHIL ABSOLUTE COUNT.: 0.04 x 1000/ÂµL (ref 0.00–1.00)
BKR WAM EOSINOPHILS: 0.3 % (ref 0.0–5.0)
BKR WAM HEMATOCRIT (2 DEC): 20.1 % — ABNORMAL LOW (ref 38.50–50.00)
BKR WAM HEMOGLOBIN: 7.1 g/dL — ABNORMAL LOW (ref 13.2–17.1)
BKR WAM IMMATURE GRANULOCYTES: 0.9 % (ref 0.0–1.0)
BKR WAM LYMPHOCYTES: 52.5 % — ABNORMAL HIGH (ref 17.0–50.0)
BKR WAM MCH (PG): 34.6 pg — ABNORMAL HIGH (ref 27.0–33.0)
BKR WAM MCHC: 35.3 g/dL (ref 31.0–36.0)
BKR WAM MCV: 98 fL (ref 80.0–100.0)
BKR WAM MONOCYTE ABSOLUTE COUNT.: 0.67 x 1000/ÂµL (ref 0.00–1.00)
BKR WAM MONOCYTES: 5 % (ref 4.0–12.0)
BKR WAM MPV: 8.9 fL (ref 8.0–12.0)
BKR WAM NEUTROPHILS: 40.8 % (ref 39.0–72.0)
BKR WAM NUCLEATED RED BLOOD CELLS: 7.7 % — ABNORMAL HIGH (ref 0.0–1.0)
BKR WAM PLATELETS: 548 x1000/ÂµL — ABNORMAL HIGH (ref 150–420)
BKR WAM RDW-CV: 20.1 % — ABNORMAL HIGH (ref 11.0–15.0)
BKR WAM RED BLOOD CELL COUNT.: 2.05 M/ÂµL — ABNORMAL LOW (ref 4.00–6.00)
BKR WAM WHITE BLOOD CELL COUNT: 13.3 x1000/ÂµL — ABNORMAL HIGH (ref 4.0–11.0)

## 2021-07-10 LAB — PROCALCITONIN     (BH GH LMW Q YH): BKR PROCALCITONIN: 0.6 ng/mL — ABNORMAL HIGH

## 2021-07-10 LAB — HEPATITIS B SURFACE ANTIGEN     (BH GH L LMW YH): BKR HEPATITIS B SURFACE ANTIGEN: NEGATIVE

## 2021-07-10 LAB — BASIC METABOLIC PANEL
BKR ANION GAP: 11 (ref 7–17)
BKR BLOOD UREA NITROGEN: 8 mg/dL (ref 6–20)
BKR BUN / CREAT RATIO: 9.3 (ref 8.0–23.0)
BKR CALCIUM: 8.8 mg/dL (ref 8.8–10.2)
BKR CHLORIDE: 99 mmol/L (ref 98–107)
BKR CO2: 22 mmol/L (ref 20–30)
BKR CREATININE: 0.86 mg/dL (ref 0.40–1.30)
BKR EGFR, CREATININE (CKD-EPI 2021): >60 mL/min/{1.73_m2} (ref >=60)
BKR GLUCOSE: 112 mg/dL — ABNORMAL HIGH (ref 70–100)
BKR POTASSIUM: 4.2 mmol/L (ref 3.3–5.3)
BKR SODIUM: 132 mmol/L — ABNORMAL LOW (ref 136–144)

## 2021-07-10 LAB — HEPATITIS A ANTIBODY, IGM: BKR HEPATITIS A IGM ANTIBODY: NEGATIVE

## 2021-07-10 LAB — PT/INR AND PTT (BH GH L LMW YH)
BKR INR: 1.05 (ref 0.92–1.19)
BKR PARTIAL THROMBOPLASTIN TIME: 33.3 s — ABNORMAL HIGH (ref 23.0–31.4)
BKR PROTHROMBIN TIME: 10.9 s (ref 9.6–12.3)

## 2021-07-10 LAB — LIVER FUNCTION TESTS     (YH)
BKR ALANINE AMINOTRANSFERASE (ALT): 119 U/L — ABNORMAL HIGH (ref 9–59)
BKR ALKALINE PHOSPHATASE: 183 U/L — ABNORMAL HIGH (ref 9–122)
BKR ASPARTATE AMINOTRANSFERASE (AST): 109 U/L — ABNORMAL HIGH (ref 10–35)
BKR AST/ALT RATIO: 0.9
BKR BILIRUBIN DIRECT: 1.1 mg/dL — ABNORMAL HIGH (ref <=0.3)
BKR BILIRUBIN TOTAL: 3 mg/dL — ABNORMAL HIGH (ref <=1.2)

## 2021-07-10 LAB — HEPATITIS C AB WITH REFLEX TO HCV PCR: BKR HEPATITIS C ANTIBODY: NEGATIVE

## 2021-07-10 LAB — HEPATITIS B CORE ANTIBODY, IGM: BKR HEPATITIS B CORE IGM ANTIBODY: NEGATIVE

## 2021-07-10 MED ORDER — OXYCODONE ER 15 MG TABLET,CRUSH RESISTANT,EXTENDED RELEASE 12 HR
15 mg | ORAL_TABLET | Freq: Two times a day (BID) | ORAL | 1 refills | Status: AC
Start: 2021-07-10 — End: 2021-07-28

## 2021-07-10 MED ORDER — OXYCODONE IMMEDIATE RELEASE 15 MG TABLET
15 mg | ORAL | Status: DC
Start: 2021-07-10 — End: 2021-07-11

## 2021-07-10 MED ORDER — OXYCODONE IMMEDIATE RELEASE 15 MG TABLET
15 mg | ORAL_TABLET | 1 refills | Status: AC
Start: 2021-07-10 — End: 2021-07-10

## 2021-07-10 MED ORDER — OXYCODONE IMMEDIATE RELEASE 5 MG TABLET
5 mg | ORAL | Status: DC
Start: 2021-07-10 — End: 2021-07-10

## 2021-07-10 MED ORDER — ENOXAPARIN 30 MG/0.3 ML SUBCUTANEOUS SYRINGE
30 mg/0.3 mL | SUBCUTANEOUS | Status: DC
Start: 2021-07-10 — End: 2021-07-11

## 2021-07-10 MED ORDER — OXYCODONE IMMEDIATE RELEASE 30 MG TABLET
30 mg | ORAL | Status: DC
Start: 2021-07-10 — End: 2021-07-11

## 2021-07-10 MED ORDER — OXYCODONE IMMEDIATE RELEASE 15 MG TABLET
15 mg | ORAL_TABLET | ORAL | 1 refills | Status: AC | PRN
Start: 2021-07-10 — End: 2021-09-27

## 2021-07-10 MED ORDER — ZZ IMS TEMPLATE
ORAL | Status: DC
Start: 2021-07-10 — End: 2021-07-10

## 2021-07-10 MED ORDER — OXYCODONE IMMEDIATE RELEASE 30 MG TABLET
30 mg | ORAL | Status: DC
Start: 2021-07-10 — End: 2021-07-10

## 2021-07-10 MED ORDER — ZZ IMS TEMPLATE
ORAL | Status: DC
Start: 2021-07-10 — End: 2021-07-11
  Administered 2021-07-10 (×2): 30 mg via ORAL

## 2021-07-10 NOTE — Other
-  CONSULT  REQUEST  [facility] NOTE-Type of consult: [facility] Hematology -New Consult: [id number] Phillip Bell /Location: C05/C05 / Brief Clinical Question: 26 yo with HbSS c/b ACS + AVN who presents with vasoocclusive crisis/Callback Cell Phone: [phone] / Please confirm receipt of this message by texting back ?OK?-1 - Mobile Heartbeat message sent to Moshe Salisbury at 9:57 AM. Received response at 0958.-[name]1/7/20238:00 [facility] [phone]

## 2021-07-10 NOTE — ED Notes
7:37 PM Floor Handoff Telemetry: 	[]  Yes		[x]  NoCode Status:   [x]  Full		[]  DNR		[]  DNI		Other (specify):Safety Precautions: []  None	[]  Sitter   []  Restraints	[]  Suicidal	[]  Fall Risk	Other (specify):Mentation/Orientation:	 A&O (Self, person, place, time) x       4   	 Disoriented to:                    	 Deficits: []  Hearing impaired	[]  Blind  []  Nonverbal	 []  Mental retardationOxygenation Upon Admission: [x]  RA	[]  NC	[]  Venti  []  Simple Mask []  Other	Baseline O2 Status? []  Yes	[]  NoAmbulation: [x]  Independent	[]  Cane   []  Walker	[]  Wheelchair	[]  Bedbound		[]  Hemiplegic	[]  Paraplegic	[]  QuadraplegicEliminiation: []  Independent	[]  Commode	[]  Bedpan/Urinal  []  Straight Cath []  Foley cath			[]  Urostomy	[]  Colostomy	Other (specify):Diarrhea/Loose stool : []  1x within 24h  []  2x within 24h  []  3x within 24h  []  None 	C.Diff Order: 	[]  Ordered- needs to be collected             []  Collected-sent to lab             []  Resulted - Negative C.Diff             []  Resulted - Positive C.Diff[]  Not Ordered   []  N/ASkin Alteration: []  Pressure Injury []  Wound []  None []  Skin not assessedDiet: []  Regular/No order placed	[]  NPO		Other (specify):IV Access: [x]  PIV   []  PICC    []  Port    [] Central line    []  A-line    Other (specify) IVF/GTT Running Upon ED Departure? []  No	    [x]  Yes (specify): 1/2 NS @ 75 mL/hrOutstanding Meds/Treatments/Tests: day shift RN called for PCA pump, has not arrived in dept yet. Patient Belongings:Are the belongings documented?          []  No	    []  YesIs someone taking belongings home?   []  No     []  Yes  Who? (specify)                                   ED RN and Contact number/MHB #:                Lyla Son

## 2021-07-10 NOTE — ED Notes
8:01 PM- care asssumed 26 yo admitted for back pain and sickle cell crisis after running out of pain medsPatient Aox4 on room air 10/10 back pain. 15mg  IV toradol and warm compress given@:9:52PM PCA pump initated with dilaudid 0.5mg  demand dose IVF 1/2 NS infusing at 82ml/hr Zosyn given. VSS. Left #18 and Right #20 PIV. Flushing well. COVID and Resp virus panel negative. OOB independentlySafety precaution maintained.

## 2021-07-10 NOTE — Discharge Summary
[facility] Dis[facility] SummaryPatient Data:  Patient Name: Phillip Bell Admit date: 07/09/2021 Age: 26 y.o. Discharge date: 07/10/2021 DOB: 10-07-1995	 Discharge Attending Physician: Gordy Councilman, MD PhD  MRN: [id number]	 Discharged Condition: stable PCP: Delphia Grates, MD (Inactive) Disposition: Home  Principal Diagnosis: Sickle cell pain crisisSecondary Diagnoses:  FeverIssues to be Addressed Post Discharge: Issues to be Addressed Post Discharge:1.	Sickle cell followup2.	3.	Pending Labs and Tests: Pending Lab Results   Order Current Status  Blood culture Preliminary result  Blood culture Preliminary result  Follow-up Information:No follow-up provider specified. Future Appointments Date Time Provider Department Center 07/28/2021  2:30 PM Nadene Rubins, APRN [facility] Course: Hospital Course: HPI per admission note by Dr Adriana Simas 1/6Patient reports that he was in his usual state of health since yesterday. He noted new mid-thoracic back pain and left leg pain. This feels similar to prior vaso-occlusive episodes that he has had in the past. He began taking his as-needed oxycodone 15, but the pain worsened significantly. This morning, he ran out of medications and presented to the Emergency Department for evaluation and pain management. ?He notes that his pain is the same as when he came in. He was given torodol for his pain in the ED because his PCA pump, which is part of his pain plan, has not yet arrived. On presentation he was febrile to 101.7.  Infectious workup including respiratory virus panel, urinalysis, blood cultures x2, chest x-ray and RUQ ultrasound were all unremarkable.  He was started on empiric antibiotics.He was started on dilaudid PCA as well as tiered oral oxycodone with improvement in his symptoms.  He was evaluated by the hematology team who recommended to stop antibiotics as no clear infectious source was identified.  This afternoon, he states he feels his pain can be managed at home and is requesting discharge.  This was discussed with the hematology team who is in agreement.  Of note, he states he has run out of pain medications, the hematology team is prescribing refills.  Appreciate their assistance, he will followup with his outpatient team.Inpatient Consultants and summary of recommendations:HematologyPertinent Procedures or Surgeries:  n/aPertinent lab findings and test results: Objective: Recent Labs Lab 01/06/231326 01/07/230548 WBC 15.2* 13.3* HGB 7.4* 7.1* HCT 20.90* 20.10* PLT 576* 548*  Recent Labs Lab 01/07/230548 NEUTROPHILS 40.8  Recent Labs Lab 01/06/231326 01/07/230548 NA 134* 132* K 4.4 4.2 CL 98 99 CO2 23 22 BUN 12 8 CREATININE 0.77 0.86 GLU 122* 112* ANIONGAP 13 11  Recent Labs Lab 01/06/231326 01/07/230548 CALCIUM 9.5 8.8  Recent Labs Lab 01/06/231326 01/07/230548 ALT 155* 119* AST 157* 109* ALKPHOS 199* 183* BILITOT 3.5* 3.0* BILIDIR 1.4* 1.1*  Recent Labs Lab 01/07/230548 PTT 33.3* LABPROT 10.9 INR 1.05  Culture Information:Recent Labs Lab 01/06/231421 LABBLOO No Growth to Date - No Growth to Date Imaging: CXRResult Date: 1/6/2023No acute findings. Reported And Signed By: Bradly Bienenstock, MD  [facility] Radiology and Biomedical Imaging Diet:  Regular dietMobility:    Physical Exam Discharge vitals: Temp:  [98.3 ?F (36.8 ?C)-100.7 ?F (38.2 ?C)] 100.3 ?F (37.9 ?C)Pulse:  [93-116] 102Resp:  [16-18] 16BP: (103-130)/(46-78) 122/67SpO2:  [91 %-97 %] 96 %Device (Oxygen Therapy): room air Discharge Physical Exam:Physical ExamAwake, alert, orientedNC/AT MMMNo JVDLungs clear b/lCor RRRAbd soft, nontenderNo edemaNo rashesAllergies Allergies Allergen Reactions ? Nickel Rash  PMH PSH Past Medical History: Diagnosis Date ? Aplastic crisis (HC Code) (HC CODE) (HC Code) 6/6/ 2005 transfusion ? Avascular necrosis of femur head, left (HC Code) (HC CODE) (HC Code)  ?  Chronic pain   sickle cell ? Conductive hearing loss 09/22/14 P.E tubes placed ? Dactylitis one episode  April, 1998 ? GERD (gastroesophageal reflux disease)  ? 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 On hydroxyurea therapy started October 2013 ? Pneumococcal vaccination given 01/20/2006 and 09/28/2012 ? 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 Vasoocclusive sickle cell crisis (HC Code) (HC CODE) (HC Code) Adm 01/02/2012, ER 04/08/12, 4/1-10/03/12, 7/2-01/05/13, 12/02/13-12/03/13 , March 2016 - 2 admissions  Past Surgical History: Procedure Laterality Date ? CHOLECYSTECTOMY, LAPAROSCOPIC  1/15 ? TYMPANOSTOMY TUBE PLACEMENT  09/22/2014  by Dr. Bernita Raisin  Social History Family History Social History Tobacco Use ? Smoking status: Never ? Smokeless tobacco: Never Substance Use Topics ? Alcohol use: Yes   Comment: beer and liquor on occasion  Family History Problem Relation Age of Onset ? Sickle cell trait Mother  ? Thyroid disease Mother  ? Sickle cell trait Father  ? Sarcoidosis Father  ? Diabetes Paternal Grandfather  ? Hypertension Paternal Grandfather  ? Alcohol abuse Maternal Aunt   Discharge Medications: Discharge: Current Discharge Medication List  CONTINUE these medications which have NOT CHANGED  Details hydroxyurea (HYDREA) 500 mg capsule 3 tab daily for blood.Qty: 90 capsule, Refills: 2  ibuprofen (ADVIL,MOTRIN) 600 mg tablet Up to 4 tablets daily as needed for pain.Qty: 100 tablet, Refills: 1  naloxone (NARCAN) 4 mg/actuation nasal spray Use 1 spray in 1 nostril for suspected opioid overdose. May repeat in 2 minutes in other nostril with new device if minimal or no response.Qty: 2 each, Refills: 0  oxyCODONE (ROXICODONE) 15 mg Immediate Release tablet 1 - 3 tablets every 3 h as needed for pain.Qty: 50 tablet, Refills: 0  Associated Diagnoses: Hb-SS disease without crisis (HC Code)  oxyCODONE myristate (XTAMPZA ER) 13.5 mg 12 hr extended release sprinkle capsule Take 1 capsule (13.5 mg total) by mouth every 12 (twelve) hours. Long-acting medicine to control pain. 340b. D57.1. 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

## 2021-07-10 NOTE — Telephone Encounter
[place] Answering Service Heme/Onc:Simcha is calling from the ER asking for a script for xtampza, he states he needs a prior authorization. Explained PA's can not be performed on the weekend. He is asking about pain medications for when he is discharged from the hospital. Discussed with him that he will need to get the appropriate scripts from the medical staff at the ER. Elenore Rota, APRN

## 2021-07-10 NOTE — Progress Notes
[facility]	 [facility]	Daily Progress NoteHospital Medicine ServiceAttending Provider: Gordy Councilman, MD PhD [phone]:  C05/C05Hospital Day #1Subjective   Patient seen and examined on 07/10/2021, 12:18 PM.CC: Sickle cell pain crisis (HC Code) (HC CODE) (HC Code)Interim History: Intermittent fevers, TMax 101.7.  Still having pain mostly in his back, though improved from yesterdayROS:Review of Systems All other systems reviewed and are negative.   Objective  Current Meds: enoxaparin prophylaxis dosing, 30 mg, Dailyhydroxyurea, 1,500 mg, Daily[Held by provider] ibuprofen, 600 mg, 4x DailyLidocaine Patch, 3 patch, Q24HoxyCODONE, 15 mg, Q12HoxyCODONE, 10 mg, Q3H OroxyCODONE, 20 mg, Q3H OroxyCODONE, 30 mg, Q3Hpiperacillin-tazobactam, 4.5 g, Q6Hsodium chloride, 3 mL, Q8H Vitals:Temp:  [98.3 ?F (36.8 ?C)-101.7 ?F (38.7 ?C)] 100.7 ?F (38.2 ?C)Pulse:  [93-116] 116Resp:  [17-18] 17BP: (103-130)/(46-64) 130/62SpO2:  [91 %-99 %] 91 %Device (Oxygen Therapy): room airLabs:I have reviewed the patient's pertinent labs as resulted in the EMR.Lab Results Component Value Date  NA 132 (L) 07/10/2021  K 4.2 07/10/2021  CL 99 07/10/2021  CO2 22 07/10/2021  BUN 8 07/10/2021  CREATININE 0.86 07/10/2021  CALCIUM 8.8 07/10/2021 Lab Results Component Value Date  WBC 13.3 (H) 07/10/2021  WBC 10.3 02/12/2015  HGB 7.1 (L) 07/10/2021  HGB 9.0 02/12/2015  HGB 8.1 (L) 09/06/2013  HCT 20.10 (L) 07/10/2021  HCT 31.0 (L) 09/21/2020  HCT 25.3 (L) 02/12/2015  HCT 24.1 (L) 09/06/2013  MCV 98.0 07/10/2021  MCV 107.0 (H) 02/12/2015  PLT 548 (H) 07/10/2021  PLT 520 (H) 02/12/2015 I/O's:Intake/Output Summary (Last 24 hours) at 07/10/2021 1218Last data filed at 07/10/2021 0730Gross per 24 hour Intake 263.5 ml Output 800 ml Net -536.5 ml Physical Exam Physical Exam Awake, alert, orientedNC/AT MMMNo JVDLungs clear b/lCor RRRAbd soft, nontenderNo edemaNo rashesAccess:  PIVFoley: NoImaging:CXRResult Date: 1/6/2023EXAM: XR CHEST PA AND LATERAL INDICATION: sickle cell patient COMPARISON: None FINDINGS: Support Devices: None Heart/Mediastinum: Normal. Lungs/Pleura: No focal consolidation. No effusion or pneumothorax. Bones: No acute findings. H-shaped vertebral bodies consistent with history of sickle cell. No acute findings. Reported And Signed By: Bradly Bienenstock, MD  [facility] Radiology and Biomedical Imaging [place] Right Upper QuadrantResult Date: 1/6/2023US RIGHT UPPER QUADRANT [facility])  History:  steatosis? Comparison: [place] ED CHEST ABDOMEN PELVIS W IV CONTRAST 2021-Jun-18. Technique: Grayscale and limited color Doppler evaluation of the right upper quadrant was performed. Findings: The liver appears slightly heterogeneous, which limits evaluation for small or subtle masses, however, no large focal hepatic mass is identified.There is no evidence of intrahepatic biliary ductal dilation. The common bile duct measures 0.5 cm in diameter. The gallbladder is surgically absent. The pancreas is not well visualized due to overlying bowel gas. The right kidney measures 10.9 cm. No nephrolithiasis, mass or hydronephrosis. The visualized main portal vein is patent with hepatopetal flow. In the upper abdomen, the abdominal aorta measures 2 cm. The demonstrated retrohepatic IVC is patent. No ascites. Impression: Status post cholecystectomy with no evidence of biliary ductal dilatation. Report Initiated By:  Andria Meuse, MD Reported And Signed By: Earl Many, MD  [facility] Radiology and Biomedical Imaging  I reviewed lab results, microbiology, imaging, test results and EKG in formulating this patient's plan of care.   Assessment: 26 y.o. male PMHx sickle cell disease, acute chest syndrom, L femoral head AVN p/w acute pain crisisPlan: #. Sickle cell pain crisis- heme consult pending- continue dilaudid PCA 0.5 mg/10 min lockout/3 mg/hr max dose, taper as able- started oral tier oxy 14/30/45- continue oxycontin 15 bid- continue hydrea- transfuse for Hct<15#. Fever, leukocytosis-  unclear source, curve seems to be downtrending.  Checking procalcitonin, continue empiric zosyn for nowDiet RegularVTE PPx: LovenoxMedication Reconciliation: Pending pharmacy review Discharge Readiness: Expected discharge timeframe: Likely 1-2 daysExpected discharge location: Home with services Full ACLSMedical decision making for this patient was moderateI spent 35 minutes today on this encounter before, during and after the visit, reviewing labs and records, evaluating and examining the patient, entering orders and documenting the visit. Signed:Derrico Zhong Ethel Rana, APRN1/7/202312:18 PM

## 2021-07-10 NOTE — ED Notes
9:52 AM patient is alert and oriented x4, VSS on RA except tachycardic, HR 110s. BP 130/62  - Pulse (!) 116  - Temp (!) 100.7 ?F (38.2 ?C) (Oral)  - Resp 17  - Wt 62.7 kg  - SpO2 (!) 91%  - BMI 17.75 kg/m? Marland Kitchen Very pleasant and cooperative with all care. PCA pump continued. Plan to continue IV ABX at this time. OOB independently with steady gait. Plan to add tier later today. Will CTMUpdate: seen by heme team, requesting to be discharged1623 [name] was discharged via Private Car accompanied by [employer].  Verbalized understanding of discharge instructionsand recommended follow up care as per the after visit summary.  Written discharge instructions provided. Denies any further questions. PIV removed.Vital signs    Vitals:  07/10/21 0405 07/10/21 0733 07/10/21 1445 07/10/21 1538 BP: 107/63 130/62 127/78 122/67 Pulse: (!) 93 (!) 116 (!) 98 (!) 102 Resp: 18 17 16 18  Temp: 98.3 ?F (36.8 ?C) (!) 100.7 ?F (38.2 ?C) 99.6 ?F (37.6 ?C) (!) 100.3 ?F (37.9 ?C) TempSrc: Oral Oral Oral Oral SpO2: 97% (!) 91% 95% 96% Weight:     Patient confirmed all belongings returned. Belongings charted in last 7 days: No data recorded

## 2021-07-11 NOTE — Consults
[facility]		Hematology/Oncology Consult Note  Patient Data:  Patient Name: Phillip Bell Age: 26 y.o. DOB: [birth year]	 MRN: [id number]	 Consult Information: Consultation requested by: Gordy Councilman, MD PhDReason for consultation: Sickle cell vaso -cclusive crisisSource of Information: Patient, Doctor and EMR/Previous RecordPresentation History: HPI Phillip Bell is a 26 y.o. male with HbSS complicated prior episodes of acute chest, L femoral head AV and pain crisis who presented with acute pain. Patient reports that he was in his usual state of health since the day prior to admission. He noted new mid-thoracic back pain and left leg pain. This felt similar to prior vaso-occlusive episodes that he has had in the past and it was refractory to home PRN oxycodone 15 and he ran out of medications. He presented to the Emergency Department for evaluation and pain management. Vitals on arrival were significant for fever with T max 101.7, tachycardia of 128 now 116, normal BP and lowest SO2 91% on room air. Labs showed mild hyponatremia, Cr 0.86, TB 3.0, BD 1.1, Alk phos 183, ALT 119, AST 109, procal 0.6, WBC 13.3 Hb 7,1 Plat 548 ANC 5.43 MRSA negative pH 7.44 UA negative hepatitis serology negative normal respiratory panelAfter initial ues of PCI, he is willing to try long acting oxycontin and short acting oxycodone and go home. Feels better and fever resolved.Review of Allergies/Medical History/Medications: I have reviewed the patient's allergies, prior to admission meds, past medical/surgical, family and social hx. Inpatient Medications:I have reviewed the patient's current medication orders.Advanced Directives:   full codeReview of Systems: Review of Systems Constitutional: Positive for fatigue and fever. Negative for activity change and appetite change. Respiratory: Negative for shortness of breath.  Cardiovascular: Negative for chest pain. Gastrointestinal: Negative for constipation, diarrhea, nausea and vomiting. Genitourinary: Negative for dysuria. Neurological: Negative for headaches.  Patient's current condition is good. Physical Exam: Vital Signs:Blood pressure 130/62, pulse (!) 116, temperature (!) 100.7 ?F (38.2 ?C), temperature source Oral, resp. rate 17, weight 62.7 kg, SpO2 (!) 91 %.Intake/Output:I have reviewed the patient's current I&O's as documented in the EMR.Physical ExamConstitutional:     Appearance: Normal appearance. HENT:    Head: Atraumatic.    Mouth/Throat:    Mouth: Mucous membranes are moist. Eyes:    Pupils: Pupils are equal, round, and reactive to light. Cardiovascular:    Rate and Rhythm: Normal rate.    Pulses: Normal pulses. Pulmonary:    Effort: Pulmonary effort is normal. Abdominal:    General: Abdomen is flat. Bowel sounds are normal.    Palpations: Abdomen is soft. Musculoskeletal:       General: Normal range of motion. Skin:   General: Skin is warm.    Capillary Refill: Capillary refill takes less than 2 seconds. Neurological:    General: No focal deficit present.    Mental Status: He is alert and oriented to person, place, and time. Mental status is at baseline. Review of Labs/Diagnostics: Lab Review:I have reviewed the patient's labs within the last 24 hrs.Diagnostic Review:I have reviewed the patient's Radiology report(s) within the last 48 hrs.  Impression:  Phillip Bell is a 26 y.o. male with HbSS complicated prior episodes of acute chest, L femoral head AV and pain crisis who presented with acute pain and fever due to vaso-occlusive crisis. Recommendations: - Home medications resumed (oxyconti n15mg  bid and oxycodone PRN (prescribed for home)- Agree with discharge and FU in 1-3 weeks- Advised to return if worsening symptoms or fever recurrencePatient was evaluated: In personSupport offered at this time. Oncology Team will continue  to follow patient.Electronically Signed: Carmela Hurt, MD For questions please page: via MHBDiscussed with Dr Zenaida Niece Doren1/01/2022 2:30 PM

## 2021-07-12 ENCOUNTER — Telehealth
Admit: 2021-07-12 | Payer: PRIVATE HEALTH INSURANCE | Attending: Family | Primary: Student in an Organized Health Care Education/Training Program

## 2021-07-12 NOTE — Telephone Encounter
LVM for patient informing him that I will contact the prior auth team to get prior auth for his medication. I also asked him to give me a call back to find out more information as to why he needs antibiotics.

## 2021-07-12 NOTE — Telephone Encounter
Returned call to patient. He stated he went to the ED on 1/7 because of chest and back pain, and feeling like I have the flu. I advised him that per Nadene Rubins no antibiotics are warranted at this time and to call back in 10 days 1/16 if his symptoms have not improved. Advised him to get plenty of rest and to stay hydrated. I also advised him to go to his PCP/get a PCP. I also informed him that I will be sending a message to the Prior auth team for his oxycontin prescription. Advised him to call the pharmacy tomorrow AM if he doesn't hear from them. Patient verbalized his understanding and appreciated the call.

## 2021-07-12 NOTE — Telephone Encounter
pt called inre: medication PA and Questionrequested refill: Oxy 15mg  tablet need PAHe also stated he was given a Medication Antibotic when he was sick last time and would like to know if he can get it again? [facility]

## 2021-07-13 ENCOUNTER — Telehealth
Admit: 2021-07-13 | Payer: PRIVATE HEALTH INSURANCE | Attending: Student in an Organized Health Care Education/Training Program | Primary: Student in an Organized Health Care Education/Training Program

## 2021-07-13 NOTE — Telephone Encounter
OxyCONTIN 15MG  er tablets IT sales professional PA Form [phone] NCPDP)  Fleming Dragone Key: [id number] - PA Case ID: [id number]  GN#[id number] 24-72 hr turnaround

## 2021-07-14 LAB — BLOOD CULTURE   (BH GH L LMW YH)
BKR BLOOD CULTURE: NO GROWTH
BKR BLOOD CULTURE: NO GROWTH

## 2021-07-24 ENCOUNTER — Encounter
Admit: 2021-07-24 | Payer: PRIVATE HEALTH INSURANCE | Attending: Family | Primary: Student in an Organized Health Care Education/Training Program

## 2021-07-28 ENCOUNTER — Ambulatory Visit
Admit: 2021-07-28 | Payer: BLUE CROSS/BLUE SHIELD | Attending: Family | Primary: Student in an Organized Health Care Education/Training Program

## 2021-07-28 ENCOUNTER — Encounter
Admit: 2021-07-28 | Payer: PRIVATE HEALTH INSURANCE | Primary: Student in an Organized Health Care Education/Training Program

## 2021-07-28 DIAGNOSIS — D571 Sickle-cell disease without crisis: Secondary | ICD-10-CM

## 2021-07-28 MED ORDER — HYDROXYUREA 500 MG CAPSULE
500 mg | ORAL_CAPSULE | 3 refills | Status: AC
Start: 2021-07-28 — End: 2021-09-01

## 2021-07-28 MED ORDER — OXYCODONE IMMEDIATE RELEASE 15 MG TABLET
15 mg | ORAL_TABLET | 1 refills | Status: AC
Start: 2021-07-28 — End: 2021-08-13

## 2021-07-28 MED ORDER — OXYCODONE ER 15 MG TABLET,CRUSH RESISTANT,EXTENDED RELEASE 12 HR
15 mg | ORAL_TABLET | Freq: Two times a day (BID) | ORAL | 1 refills | Status: AC
Start: 2021-07-28 — End: 2021-09-01

## 2021-07-29 ENCOUNTER — Telehealth
Admit: 2021-07-29 | Payer: PRIVATE HEALTH INSURANCE | Attending: Family | Primary: Student in an Organized Health Care Education/Training Program

## 2021-07-29 NOTE — Telephone Encounter
Patient called the office and stated that he needs prior authorization for his insurance, regarding his Oxycontin 15mg  extended release tablet medication. Please advise.

## 2021-07-29 NOTE — Telephone Encounter
Request sent to prior auth team.

## 2021-07-30 ENCOUNTER — Telehealth
Admit: 2021-07-30 | Payer: PRIVATE HEALTH INSURANCE | Attending: Family | Primary: Student in an Organized Health Care Education/Training Program

## 2021-07-30 NOTE — Telephone Encounter
OxyCONTIN 15MG  er tablets IT sales professional PA Form [phone] NCPDP)  Zorion Crislip Key: [id number] - PA Case ID: [id number] Need help? Call us at [phone]   id  [id number]    npi   [phone]   24-72 hr turnaround

## 2021-07-30 NOTE — Progress Notes
SOCIAL WORK NOTEPatient Name: Phillip RiceMedical Record Number: [id number] Date of Birth: Jan 13, 1997Social Work Non-Billing Follow Up  [employer] Most Recent Value Admission Information  Document Type Progress Note Reason for Current Social Work Involvement Social Determinants of Health Assistance Source of Information Patient Record Reviewed Yes Level of Care Ambulatory High risk psychosocial issues requiring intervention Psychosocial concerns Psychosocial interventions 15 minutes spent with Mr. Blasius face to face before clinic visit with Nadene Rubins, APRN. I asked Mr. Krammes if he was in need of social work support. He reports he does not at this time. I inquired about Mr. Lamartina?s need for health insurance as expressed during his last outpatient appointment. He reports his mother was able to get him insurance with [employer]. He also states he is currently working from home, full-time for a [employer]. Mr. Sweeden denied any other psychosocial concerns at this time. I encouraged him to call me should any psychosocial concerns arise. I will remain available as needed. Handoff Required? No Next Steps/Plan (including hand-off): Social work intervention completed at this time. Please consult social work as needed. Signature: Coral Spikes, LMSW Contact Information: [phone]

## 2021-08-11 ENCOUNTER — Encounter
Admit: 2021-08-11 | Payer: PRIVATE HEALTH INSURANCE | Attending: Vascular and Interventional Radiology | Primary: Student in an Organized Health Care Education/Training Program

## 2021-08-11 ENCOUNTER — Telehealth
Admit: 2021-08-11 | Payer: PRIVATE HEALTH INSURANCE | Primary: Student in an Organized Health Care Education/Training Program

## 2021-08-11 NOTE — Telephone Encounter
Called patient to see if he was able to pick up his long acting oxycontin. He stated that he has not picked it up yet. I advised him that the prior auth was done so it should be all set for him to pick it up. He verbalized his understanding and will call the pharmacy. I advised him to give [place] a call back if there are any issues. He appreciated the call.

## 2021-08-11 NOTE — Progress Notes
[facility]ult [facility]ickle [facility] [phone]/25/2023[name] DOB December 28, 1997 CCScheduled return for sickle cell disease.ProbPatient Active Problem List Diagnosis ? Eustachian tube disorder, bilateral ? Tonsillar and adenoid hypertrophy ? Sickle cell disease, type SS (HC Code) (HC CODE) (HC Code) ? 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 Hx acute chest syndrome (HC Code) ? Hx cholecystectomy ? Closed displaced segmental fracture of shaft of left femur, initial encounter (HC Code) ? Sickle cell disease without crisis (HC Code) (HC CODE) (HC Code) ? Decreased range of motion (ROM) of left knee ? Decreased strength of lower extremity ? Altered gait ? Sickle cell disease with crisis (HC Code) (HC CODE) (HC Code) ? Pneumonia ? Acute chest syndrome (HC Code) ? 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 Left leg injury ? Sickle cell pain crisis (HC Code) (HC CODE) (HC Code) PCP Advised to have PCP in [facility]. Brief HistorySickle cell disease (HbSS)Multiple hospitalizations for acute sickle cell painAvascular necrosis of left femoral headHx acute chest syndromeHx blood transfusionOther PMHxSurgical Hx1/15	Cholecystectomy3/16	PE tubes2017	Wisdom teeth removedFMHxMedCurrent Outpatient Medications Medication Sig ? hydroxyurea (HYDREA) 500 mg capsule 3 tab daily for blood. ? ibuprofen (ADVIL,MOTRIN) 600 mg tablet Up to 4 tablets daily as needed for pain. ? naloxone (NARCAN) 4 mg/actuation nasal spray Use 1 spray in 1 nostril for suspected opioid overdose. May repeat in 2 minutes in other nostril with new device if minimal or no response. ? oxyCODONE (OXYCONTIN) 15 mg 12 hr extended release tablet Take 1 tablet (15 mg total) by mouth every 12 (twelve) hours. ? oxyCODONE (ROXICODONE) 15 mg Immediate Release tablet Take 1 tablet (15 mg total) by mouth every 4 (four) hours as needed for pain. ? oxyCODONE myristate (XTAMPZA ER) 13.5 mg 12 hr extended release sprinkle capsule Take 1 capsule (13.5 mg total) by mouth every 12 (twelve) hours. Long-acting medicine to control pain. 340b. D57.1. For prior authorization Fax [phone]. No current facility-administered medications for this visit. AllergiesAllergies Allergen Reactions ? Nickel Rash  Interval Hx/ROSMitchel presents for his scheduled follow up appointment. Recent admit 1/6-07/10/2021 for pain crisis. Reports feeling better post discharge.  Reports having new insurance. He continues on hydroxyurea 3 tablets daily.  Discussed crizanlizumab, not interested currently. Denies fever, chills, SOB, chest pain, nausea, vomiting, or constipation.PEBP 130/72  - Pulse 84  - Temp 98.1 ?F (36.7 ?C) (Oral)  - Resp 18  - Ht 6' 3 (1.905 m)  - Wt 64.6 kg  - SpO2 100%  - BMI 17.80 kg/m?  General: alert, conversant, nadHEENT:ac/at, anicteric sclera, eomi.CV:  rrrResp:  cta b/lAbdomen: soft, non-tender, bowel sounds presentPsych: normal affectInterval Lab/Imaging Latest Reference Range & Units 07/10/21 05:48 Sodium 136 - 144 mmol/L 132 (L) Potassium 3.3 - 5.3 mmol/L 4.2 Chloride 98 - 107 mmol/L 99 CO2 20 - 30 mmol/L 22 Anion Gap 7 - 17  11 BUN 6 - 20 mg/dL 8 Creatinine 1.88 - 4.16 mg/dL 6.06 BUN/Creatinine Ratio 8.0 - 23.0  9.3 eGFR (Creatinine) >=60 mL/min/1.25m2 >60 Glucose 70 - 100 mg/dL 301 (H) Calcium 8.8 - 60.1 mg/dL 8.8 Total Bilirubin <=0.9 mg/dL 3.0 (H) Bilirubin, Direct <=0.3 mg/dL 1.1 (H) Alkaline Phosphatase 9 - 122 U/L 183 (H) Alanine Aminotransferase (ALT) 9 - 59 U/L 119 (H) Aspartate Aminotransferase (AST) 10 - 35 U/L 109 (H) AST/ALT Ratio See Comment  0.9 PROCALCITONIN     (BH GH LMW Q YH)  Rpt ! WBC 4.0 - 11.0 x1000/?L 13.3 (H) RBC 4.00 - 6.00 M/?L 2.05 (L)  Hemoglobin 13.2 - 17.1 g/dL 7.1 (L) Hematocrit 16.10 - 50.00 % 20.10 (L) MCV 80.0 - 100.0 fL 98.0 MCH 27.0 - 33.0 pg 34.6 (H) MCHC 31.0 - 36.0 g/dL 96.0 RDW-CV 45.4 - 09.8 % 20.1 (H) Platelets 150 - 420 x1000/?L 548 (H) MPV 8.0 - 12.0 fL 8.9 Neutrophils 39.0 - 72.0 % 40.8 Lymphocytes 17.0 - 50.0 % 52.5 (H) Monocytes 4.0 - 12.0 % 5.0 Eosinophils 0.0 - 5.0 % 0.3 Basophils 0.0 - 1.4 % 0.5 Immature Granulocytes 0.0 - 1.0 % 0.9 nRBC 0.0 - 1.0 % 7.7 (H) ANC (Abs Neutrophil Count) 2.00 - 7.60 x 1000/?L 5.43 Absolute Lymphocyte Count 0.60 - 3.70 x 1000/?L 6.99 (H) Monocytes (Absolute) 0.00 - 1.00 x 1000/?L 0.67 Eosinophil Absolute Count 0.00 - 1.00 x 1000/?L 0.04 Basophils Absolute 0.00 - 1.00 x 1000/?L 0.07 Immature Granulocytes (Abs) 0.00 - 0.30 x 1000/?L 0.12 nRBC Absolute 0.00 - 1.00 x 1000/?L 1.02 (H) Prothrombin Time 9.6 - 12.3 seconds 10.9 INR 0.92 - 1.19  1.05 PTT 23.0 - 31.4 seconds 33.3 (H) (L): Data is abnormally low(H): Data is abnormally high!: Data is abnormalRpt: View report in Results Review for more informationImp/PlanCovid-19?	07/28/2021:  VaccinatedSickle cell disease (Hbxx)?	02/18/2019: On hydroxyurea 500 mg 3 tab daily. Reluctant to increase dose. Sent names of 3 new drugs, glutamine, voxelotor, crizanlizumab. ?	07/28/2021:  Discussed increasing hydroxyuea, not interested.  Discuss Voxelotor at next visit. Transfusion Hx?	09/11/2019: [facility] [place]. Social situation?	Lives: parents in [place]. 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 Interests: hanging out, applying for jobs?	01/16/2020: Continues to live with parents. 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 Also considering moving to [employer] (college there).  ?	03/18/2020:  Starting a new job in 1 week. ?	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 Discharged on apixaban, apparently stopped it early. 03/18/2020:  In follow up with Ortho. 05/18/2020: Would like to return back to having physical therapy, he will communicate with Orthopedic.07/13/2020:  Has follow up with Ortho tomorrow. 09/14/2020:  Scheduled for surgery on 09/21/2020. Lab work obtained today. Discussed hct goal 28-30 prior to surgery. 07/28/2021: Has scheduled appointment with Orthopedics.Intermittent pain1/25/2023:  Continue oxcodone 15 mg #60, oxycontin 15 mg #60Medical marijuana?	Discussed 07/17/17. Certified and accessed thereafter. ?	10/09/2019: Re-certified 3/21.?	01/16/2020: Trying to be clean.Health MaintenanceFYI: 3/10/2021Urine tox screen: OK 07/17/17.PMP: OK 2/8/2023Naloxone: in possession 3/10/2021Urine prot/creat ratio: 0.09 1/19Iron status: 643 7/17IV access: OKCigarettes: noAlcohol: occassionalEye: Sees community MD ophth Dr Luanne Bras: reports q43mVaccine/AntibodiesInfluenza: 10/2019PPV23: 10/15, 3/14PCV13: 7/12Men-4: 7/14, 7/11Men-B: 7/19Tdap: 4/21Hep A: Ab pos 1/19Hep B: sAb neg 1/19.  Dose #1 09/07/17; Dose #2 04/16/18 #3 4/07/2021Hep C: Ab neg 1/19Varicella: Ab pos 8/18. HPV: 7/19, 7/14, 8/13HIV: neg 1/19GuidelineInfluenza	Annually				Varicella	Evidence of immunity:PPV23		2 doses 5 y apart; peds doses count			Hx vaccine x 2		3rd dose age 11 					[place] born before 18		8 wks from PCV13					Hx varicella			1 mo from Men-4/Men-B 				Hx varicella zosterPCV13		1 lifetime dose						Ab pos		1 y from PPV23								1 mo from Men-4/Men-B 		Zoster		60+Men-4		2 or 3 doses depending upon vaccine		2 mo apart		1 mo from PPV23/PCV13		HPV		Women thru age 26Men-B		2 doses 2 mo apart		1 mo from PPV23/PCV13				0, 1, 4 moTdap		Every 10 years				HIV		Ab screenHep A		2 doses 6 mo apartHep B		0, 1, 4 moHep C		Ab screen onceDispositionRTC in  2 monthsJoanna [place], APRN

## 2021-08-11 NOTE — Telephone Encounter
Patient called the office and stated that he spoke with the pharmacy in regards to the prior authorization request for his Oxycontin medication, and they stated that they did not receive it.Also, patient is requesting medication refills for Ibuprofen 600mg  and Oxycodone 15mg  [facility] [place], [place]. Please advise.

## 2021-08-12 ENCOUNTER — Ambulatory Visit
Admit: 2021-08-12 | Payer: PRIVATE HEALTH INSURANCE | Attending: Family | Primary: Student in an Organized Health Care Education/Training Program

## 2021-08-12 ENCOUNTER — Encounter
Admit: 2021-08-12 | Payer: PRIVATE HEALTH INSURANCE | Attending: Family | Primary: Student in an Organized Health Care Education/Training Program

## 2021-08-12 MED ORDER — IBUPROFEN 600 MG TABLET
600 mg | ORAL_TABLET | 2 refills | Status: AC
Start: 2021-08-12 — End: 2021-12-09

## 2021-08-12 NOTE — ED Provider Notes
Chief Complaint Patient presents with ? Sickle Cell Pain Medical Decision Making--------------------------------------------------------------------------------------------------------------------------------Medical Decision Making:Pt is a 26 y.o. male history of sickle cell disease complicated by acute chest syndrome and avascular necrosis of the left femoral head who presents to the ED with a 1 day history of back pain.  Patient says the pain is located in the mid and lower back with radiation to bilateral flanks. It has progressively worsened since yesterday and patient ran out of his home medications last night.  He has full spinal range of motion however appears very uncomfortable.  He has tenderness to palpation of the thoracic and lumbar spine. He also complains of mild left calf pain. L leg is non-edematous and there are no temperature discrepancies when compared to right.  He denies fevers, chills, chest pain, and shortness the breath. PE findings:BP 131/73  - Pulse (!) 128  - Temp 99.8 ?F (37.7 ?C) (Oral)  - Resp 16  - SpO2 96% Plan:Patient's ED pain regimen: Toradol 15 mg IV, hydromorphone 1 mg IV q62min for 3 doses, lidoderm patch to mid and lower back Will reassessBasic labs, CXR to rule out acute chest given patient's history Differential diagnoses: sickle cell pain crisis r/o acute chest Low suspicion for DVT considering patient's physical exam does not show swelling, skin color change, or temperature changes. The pain also began at the same as his back pain. Patient does not have urinary incontinence or retention, and does not have lower extremity numbness or weakness. Low clinical suspicion for cauda equina or conus medullaris. While in ED patient became febrile to 101.7 F. sepsis order set was placed and patient is now receiving vancomycin, Zosyn, 1 L of normal saline.  Hematology aware.  Patient admitted to medicine.R/o UTI, pneumonia, bacteremia, viral syndromeEvaluated with Dr. Crissie Figures, MDPGY-1 Emergency MedicineAvailable through MHB1/12/2021 1:14 PMThis note may have been generated using M*Modal voice dictation software please excuse any typographical errors due to flaws in voice recognition.This encounter occurred within the context of the Covid-19 pandemic. --------------------------------------------------------------------------------------------------------------------------------Amount and/or Complexity of Data ReviewedLabs: ordered.Radiology: ordered.RiskOTC drugs.Prescription drug management.Decision regarding hospitalization.  Physical ExamED Triage Vitals [07/09/21 1107]BP: 131/73Pulse: (!) 128Pulse from  O2 sat: n/aResp: 16Temp: 99.8 ?F (37.7 ?C)Temp src: OralSpO2: 96 % BP 122/67  - Pulse (!) 102  - Temp (!) 100.3 ?F (37.9 ?C) (Oral)  - Resp 18  - Wt 62.7 kg  - SpO2 96%  - BMI 17.75 kg/m? Physical ExamConstitutional:     General: He is not in acute distress.   Appearance: Normal appearance. HENT:    Head: Normocephalic and atraumatic.    Nose: Nose normal.    Mouth/Throat:    Mouth: Mucous membranes are moist.    Pharynx: Oropharynx is clear. Eyes:    Extraocular Movements: Extraocular movements intact.    Conjunctiva/sclera: Conjunctivae normal. Cardiovascular:    Rate and Rhythm: Regular rhythm. Tachycardia present.    Pulses: Normal pulses.    Heart sounds: Normal heart sounds. Pulmonary:    Effort: Pulmonary effort is normal.    Breath sounds: Normal breath sounds. No wheezing or rales. Abdominal:    General: Abdomen is flat.    Palpations: Abdomen is soft.    Tenderness: There is no abdominal tenderness. There is no guarding. Musculoskeletal:       General: No swelling. Normal range of motion.    Cervical back: Normal range of motion and neck supple.    Right lower leg: No edema.    Left lower leg: No edema.  Comments: Tenderness to palpation of mid-thoracic and lumbar spine. Skin:   General: Skin is warm and dry.    Findings: No erythema. Neurological:    General: No focal deficit present.    Mental Status: He is alert and oriented to person, place, and time. Psychiatric:       Mood and Affect: Mood normal.       Thought Content: Thought content normal.  ProceduresAttestation/Critical CarePatient Reevaluation: Attending Supervised: ResidentI saw and examined the patient. I agree with the findings and plan of care as documented except as noted below. Additional acute and/or chronic problems addressed: SS dz, febrile no clear source.  Concern for Acute Chest syndrome, PNA, sepsis, vasocclusive pain crisis.  Labs, fluuids, fever w/u, abx, consult SS dz service. Tonny Isensee ParwaniPatient signed out to me at change of shift, Hospital border, no active issues shift prior to admission.Reinier Kristine Linea, MDPatient progress: stableClinical Impressions as of 08/09/21 1151 Sickle cell disease without crisis [facility] Code) [facility] CODE) [facility] Code)  ED DispositionAdmit Lodema Hong, MDResident01/06/23 1635 [address], Bobbye Riggs, MD01/08/23 1147 Haze Justin, MD02/09/23 1713

## 2021-08-13 ENCOUNTER — Encounter
Admit: 2021-08-13 | Payer: PRIVATE HEALTH INSURANCE | Attending: Family | Primary: Student in an Organized Health Care Education/Training Program

## 2021-08-13 DIAGNOSIS — D571 Sickle-cell disease without crisis: Secondary | ICD-10-CM

## 2021-08-13 MED ORDER — OXYCODONE IMMEDIATE RELEASE 15 MG TABLET
15 mg | ORAL_TABLET | 1 refills | Status: AC
Start: 2021-08-13 — End: 2021-09-01

## 2021-08-13 NOTE — Telephone Encounter
Patient called in to check on the status of his Oxycodone 15 mg- he said he received a call from the pharmacy that the ibuprofen was filled twice . He said he needs the Oxycodone  [id number] [place],  - [address]

## 2021-08-25 ENCOUNTER — Telehealth
Admit: 2021-08-25 | Payer: PRIVATE HEALTH INSURANCE | Primary: Student in an Organized Health Care Education/Training Program

## 2021-08-25 NOTE — Telephone Encounter
Called patient to advise him that the peer to peer review for his long acting medication was completed and the drug was approved. We are awaiting an approval letter, and will call him when we know he is able to pick up his prescription. LVM.

## 2021-09-01 ENCOUNTER — Telehealth
Admit: 2021-09-01 | Payer: PRIVATE HEALTH INSURANCE | Attending: Family | Primary: Student in an Organized Health Care Education/Training Program

## 2021-09-01 ENCOUNTER — Encounter
Admit: 2021-09-01 | Payer: PRIVATE HEALTH INSURANCE | Attending: Family | Primary: Student in an Organized Health Care Education/Training Program

## 2021-09-01 DIAGNOSIS — D571 Sickle-cell disease without crisis: Secondary | ICD-10-CM

## 2021-09-01 MED ORDER — HYDROXYUREA 500 MG CAPSULE
500 mg | ORAL_CAPSULE | 3 refills | Status: AC
Start: 2021-09-01 — End: 2021-09-27

## 2021-09-01 MED ORDER — OXYCODONE ER 15 MG TABLET,CRUSH RESISTANT,EXTENDED RELEASE 12 HR
15 mg | ORAL_TABLET | Freq: Two times a day (BID) | ORAL | 1 refills | Status: AC
Start: 2021-09-01 — End: 2021-09-27

## 2021-09-01 MED ORDER — OXYCODONE IMMEDIATE RELEASE 15 MG TABLET
15 mg | ORAL_TABLET | 1 refills | Status: AC
Start: 2021-09-01 — End: 2021-09-14

## 2021-09-01 NOTE — Telephone Encounter
Patient requests refills on hydroxyurea (HYDREA) 500 mg capsule and oxyCODONE (ROXICODONE) 15 mg Immediate Release tablet [facility] [address] in [place]

## 2021-09-08 ENCOUNTER — Encounter
Admit: 2021-09-08 | Payer: PRIVATE HEALTH INSURANCE | Attending: Student in an Organized Health Care Education/Training Program | Primary: Student in an Organized Health Care Education/Training Program

## 2021-09-14 ENCOUNTER — Ambulatory Visit
Admit: 2021-09-14 | Payer: BLUE CROSS/BLUE SHIELD | Attending: Student in an Organized Health Care Education/Training Program | Primary: Student in an Organized Health Care Education/Training Program

## 2021-09-14 ENCOUNTER — Encounter
Admit: 2021-09-14 | Payer: PRIVATE HEALTH INSURANCE | Attending: Student in an Organized Health Care Education/Training Program | Primary: Student in an Organized Health Care Education/Training Program

## 2021-09-27 ENCOUNTER — Inpatient Hospital Stay
Admit: 2021-09-27 | Discharge: 2021-09-27 | Payer: BLUE CROSS/BLUE SHIELD | Primary: Student in an Organized Health Care Education/Training Program

## 2021-09-27 ENCOUNTER — Encounter
Admit: 2021-09-27 | Payer: PRIVATE HEALTH INSURANCE | Attending: Family | Primary: Student in an Organized Health Care Education/Training Program

## 2021-09-27 ENCOUNTER — Ambulatory Visit
Admit: 2021-09-27 | Payer: BLUE CROSS/BLUE SHIELD | Attending: Family | Primary: Student in an Organized Health Care Education/Training Program

## 2021-09-27 DIAGNOSIS — D571 Sickle-cell disease without crisis: Secondary | ICD-10-CM

## 2021-09-27 LAB — CBC WITH AUTO DIFFERENTIAL
BKR WAM ABSOLUTE IMMATURE GRANULOCYTES.: 0.02 x 1000/??L (ref 0.00–0.30)
BKR WAM ABSOLUTE LYMPHOCYTE COUNT.: 2.11 x 1000/??L (ref 0.60–3.70)
BKR WAM ABSOLUTE NRBC (2 DEC): 0.34 x 1000/??L (ref 0.00–1.00)
BKR WAM ANALYZER ANC: 3.63 x 1000/??L (ref 2.00–7.60)
BKR WAM BASOPHIL ABSOLUTE COUNT.: 0.07 x 1000/ÂµL (ref 0.00–1.00)
BKR WAM BASOPHILS: 1 % (ref 0.0–1.4)
BKR WAM EOSINOPHIL ABSOLUTE COUNT.: 0.29 x 1000/??L (ref 0.00–1.00)
BKR WAM EOSINOPHILS: 4.2 % (ref 0.0–5.0)
BKR WAM HEMATOCRIT (2 DEC): 22.4 % — ABNORMAL LOW (ref 38.50–50.00)
BKR WAM HEMOGLOBIN: 8 g/dL — ABNORMAL LOW (ref 13.2–17.1)
BKR WAM IMMATURE GRANULOCYTES: 0.3 % (ref 0.0–1.0)
BKR WAM LYMPHOCYTES: 30.6 % (ref 17.0–50.0)
BKR WAM MCH (PG): 37 pg — ABNORMAL HIGH (ref 27.0–33.0)
BKR WAM MCHC: 35.7 g/dL (ref 31.0–36.0)
BKR WAM MCV: 103.7 fL — ABNORMAL HIGH (ref 80.0–100.0)
BKR WAM MONOCYTE ABSOLUTE COUNT.: 0.77 x 1000/??L (ref 0.00–1.00)
BKR WAM MONOCYTES: 11.2 % (ref 4.0–12.0)
BKR WAM MPV: 8.6 fL (ref 8.0–12.0)
BKR WAM NEUTROPHILS: 52.7 % (ref 39.0–72.0)
BKR WAM NUCLEATED RED BLOOD CELLS: 4.9 % — ABNORMAL HIGH (ref 0.0–1.0)
BKR WAM PLATELETS: 533 x1000/??L — ABNORMAL HIGH (ref 150–420)
BKR WAM RDW-CV: 16.3 % — ABNORMAL HIGH (ref 11.0–15.0)
BKR WAM RED BLOOD CELL COUNT.: 2.16 M/??L — ABNORMAL LOW (ref 4.00–6.00)
BKR WAM WHITE BLOOD CELL COUNT: 6.9 x1000/??L (ref 4.0–11.0)

## 2021-09-27 LAB — HEPATIC FUNCTION PANEL
BKR A/G RATIO: 1.5 % — ABNORMAL LOW (ref 1.0–2.2)
BKR ALANINE AMINOTRANSFERASE (ALT): 31 U/L (ref 9–59)
BKR ALBUMIN: 4.6 g/dL — ABNORMAL LOW (ref 3.6–4.9)
BKR ALKALINE PHOSPHATASE: 93 U/L (ref 9–122)
BKR ASPARTATE AMINOTRANSFERASE (AST): 57 U/L — ABNORMAL HIGH (ref 10–35)
BKR AST/ALT RATIO: 1.8 % — ABNORMAL HIGH (ref 0.0–2.0)
BKR BILIRUBIN DIRECT: 0.3 mg/dL (ref <=0.3)
BKR BILIRUBIN TOTAL: 2.4 mg/dL — ABNORMAL HIGH (ref <=1.2)
BKR GLOBULIN: 3 g/dL (ref 2.3–3.5)
BKR PROTEIN TOTAL: 7.6 g/dL — ABNORMAL HIGH (ref 6.6–8.7)
BKR WAM RETICULOCYTE HGB EQUIVALENT: 31 U/L — ABNORMAL HIGH (ref 9–59)

## 2021-09-27 LAB — BASIC METABOLIC PANEL
BKR ANION GAP: 11 (ref 7–17)
BKR BLOOD UREA NITROGEN: 9 mg/dL (ref 6–20)
BKR BUN / CREAT RATIO: 12 (ref 8.0–23.0)
BKR CALCIUM: 9.3 mg/dL (ref 8.8–10.2)
BKR CHLORIDE: 105 mmol/L (ref 98–107)
BKR CO2: 24 mmol/L (ref 20–30)
BKR CREATININE: 0.75 mg/dL (ref 0.40–1.30)
BKR EGFR, CREATININE (CKD-EPI 2021): >60 mL/min/{1.73_m2} (ref >=60)
BKR GLUCOSE: 105 mg/dL — ABNORMAL HIGH (ref 70–100)
BKR POTASSIUM: 4.3 mmol/L (ref 3.3–5.3)
BKR SODIUM: 140 mmol/L (ref 136–144)

## 2021-09-27 LAB — RETICULOCYTES
BKR WAM IRF: 37.8 % — ABNORMAL HIGH (ref 3.0–15.9)
BKR WAM RETICULOCYTE - ABS (3 DEC): 0.175 10??6 cells/uL — ABNORMAL HIGH (ref 0.023–0.140)
BKR WAM RETICULOCYTE COUNT PCT (1 DEC): 8.1 % — ABNORMAL HIGH (ref 0.6–2.7)

## 2021-09-27 MED ORDER — OXYCODONE IMMEDIATE RELEASE 15 MG TABLET
15 mg | ORAL_TABLET | ORAL | 1 refills | Status: AC | PRN
Start: 2021-09-27 — End: 2021-10-25

## 2021-09-27 MED ORDER — OXYCODONE ER 15 MG TABLET,CRUSH RESISTANT,EXTENDED RELEASE 12 HR
15 mg | ORAL_TABLET | Freq: Two times a day (BID) | ORAL | 1 refills | Status: AC
Start: 2021-09-27 — End: 2021-10-25

## 2021-09-27 MED ORDER — HYDROXYUREA 500 MG CAPSULE
500 mg | ORAL_CAPSULE | 3 refills | Status: AC
Start: 2021-09-27 — End: 2021-12-27

## 2021-09-29 LAB — HEMOGLOBINOPATHY EVALUATION SCREENING
BKR HEMOGLOBIN A2 QUANTITATION: 3.6 % — ABNORMAL HIGH (ref 0.0–4.0)
BKR HEMOGLOBIN F QUANTITATION: 12.9 % — ABNORMAL HIGH (ref 0.0–2.0)
BKR HGB S: 83.5 % — ABNORMAL HIGH

## 2021-10-05 ENCOUNTER — Inpatient Hospital Stay
Admit: 2021-10-05 | Discharge: 2021-10-05 | Payer: BLUE CROSS/BLUE SHIELD | Primary: Student in an Organized Health Care Education/Training Program

## 2021-10-05 ENCOUNTER — Encounter
Admit: 2021-10-05 | Payer: BLUE CROSS/BLUE SHIELD | Attending: Orthopaedic Trauma | Primary: Student in an Organized Health Care Education/Training Program

## 2021-10-05 DIAGNOSIS — S72362A Displaced segmental fracture of shaft of left femur, initial encounter for closed fracture: Secondary | ICD-10-CM

## 2021-10-05 NOTE — Progress Notes
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 ?His endurance is improving, the patient states that his pain is substantially better, he does not utilize any assistive devices at this point.?The patient states that his knee pain that he had before his nonunion repair is now gone. ?He states that his leg feels lighter and more stable.????Denies constitutional symptoms no obvious deformity to his left lower extremity?Physical examination:Surgical incision is well coapted without any obvious signs of infection peri-incisional erythema or drainage.?Patient is able to extend and flex his knee.?He is able to obtain and maintain full knee extension and is able to flex greater than 90??No obvious coronal plane deformity appreciable sagittal plane deformity appreciable and left thigh?No obvious pain with palpation over left femur with no obvious crepitation?Radiographs?were obtained and independently interpreted of left femur which demonstrate plate fixation in place. ?Plan:One year  status post open treatment with nonunion repair, nonunion repair appears to be heading in the right direction with formation of bone and decreased symptoms.?We will have the patient continue to work on strengthening weight-bearing as tolerated and resistive exercises. ?He can follow up with [place] on an as-needed basis

## 2021-10-05 NOTE — Progress Notes
Review of Systems Constitutional: Negative.  HENT: Negative.  Eyes: Negative.  Respiratory: Negative.  Cardiovascular: Negative.  Gastrointestinal: Negative.  Genitourinary: Negative.  Musculoskeletal:      Follow up for left leg reports no current pain  Skin: Negative.  Neurological: Negative.  Endo/Heme/Allergies: Negative.  Psychiatric/Behavioral: Negative.

## 2021-10-20 NOTE — Progress Notes
[facility] [phone]/27/2023[name] DOB 1997/08/[id number] CCScheduled return for sickle cell disease.ProbPatient Active Problem List Diagnosis ? Eustachian tube disorder, bilateral ? Tonsillar and adenoid hypertrophy ? Sickle cell disease, type SS (HC Code) (HC CODE) (HC Code) ? 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 Hx acute chest syndrome (HC Code) ? Hx cholecystectomy ? Closed displaced segmental fracture of shaft of left femur, initial encounter (HC Code) ? Sickle cell disease without crisis (HC Code) (HC CODE) (HC Code) ? Decreased range of motion (ROM) of left knee ? Decreased strength of lower extremity ? Altered gait ? Sickle cell disease with crisis (HC Code) (HC CODE) (HC Code) ? Pneumonia ? Acute chest syndrome (HC Code) ? 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 Left leg injury ? Sickle cell pain crisis (HC Code) (HC CODE) (HC Code) PCPDr. Laural Benes HistorySickle cell disease (HbSS)Multiple hospitalizations for acute sickle cell painAvascular necrosis of left femoral headHx acute chest syndromeHx blood transfusionOther PMHxSurgical Hx1/15	Cholecystectomy3/16	PE tubes2017	Wisdom teeth removedFMHxMedCurrent Outpatient Medications Medication Sig ? hydroxyurea (HYDREA) 500 mg capsule 3 tab daily for blood. ? ibuprofen (ADVIL,MOTRIN) 600 mg tablet Up to 4 tablets daily as needed for pain. ? naloxone (NARCAN) 4 mg/actuation nasal spray Use 1 spray in 1 nostril for suspected opioid overdose. May repeat in 2 minutes in other nostril with new device if minimal or no response. ? oxyCODONE (OXYCONTIN) 15 mg 12 hr extended release tablet Take 1 tablet (15 mg total) by mouth every 12 (twelve) hours. ? oxyCODONE (ROXICODONE) 15 mg Immediate Release tablet Take 1 tablet (15 mg total) by mouth every 4 (four) hours as needed for pain. No current facility-administered medications for this visit. AllergiesAllergies Allergen Reactions ? Nickel Rash  Interval Hx/ROSMitchel presents for his scheduled follow up appointment. No ED/hospitalization between interval of last scheduled visit. Reports he has been doing well. He is currently looking for a new job.He is on hydroxyurea 1500 mg daily.   He is  on opioids and nsaids for sickle cell pain.  Denies fever,chills, sob, chest pain, nausea, vomiting, constipation.PEGeneral: alert, conversant, nadHEENT:ac/at, anicteric sclera, eomi.CV:  rrrResp:  cta b/lAbdomen: soft, non-tender, bowel sounds presentPsych: normal affectInterval Lab/Imaging Latest Reference Range & Units 09/27/21 14:19 Sodium 136 - 144 mmol/L 140 Potassium 3.3 - 5.3 mmol/L 4.3 Chloride 98 - 107 mmol/L 105 CO2 20 - 30 mmol/L 24 Anion Gap 7 - 17  11 BUN 6 - 20 mg/dL 9 Creatinine 1.61 - 0.96 mg/dL 0.45 BUN/Creatinine Ratio 8.0 - 23.0  12.0 eGFR (Creatinine) >=60 mL/min/1.107m2 >60 Glucose 70 - 100 mg/dL 409 (H) Calcium 8.8 - 81.1 mg/dL 9.3 Total Bilirubin <=9.1 mg/dL 2.4 (H) Bilirubin, Direct <=0.3 mg/dL 0.3 Alkaline Phosphatase 9 - 122 U/L 93 Alanine Aminotransferase (ALT) 9 - 59 U/L 31 Aspartate Aminotransferase (AST) 10 - 35 U/L 57 (H) AST/ALT Ratio See Comment  1.8 Total Protein 6.6 - 8.7 g/dL 7.6 Albumin 3.6 - 4.9 g/dL 4.6 Globulin 2.3 - 3.5 g/dL 3.0 A/G Ratio 1.0 - 2.2  1.5 WBC 4.0 - 11.0 x1000/?L 6.9 RBC 4.00 - 6.00 M/?L 2.16 (L) Hemoglobin 13.2 - 17.1 g/dL 8.0 (L) Hematocrit 47.82 - 50.00 % 22.40 (L) MCV 80.0 - 100.0 fL 103.7 (H) MCH 27.0 - 33.0 pg 37.0 (H) MCHC 31.0 - 36.0 g/dL 95.6 RDW-CV 21.3 - 08.6 % 16.3 (H) Platelets 150 - 420 x1000/?L 533 (H) MPV 8.0 - 12.0 fL 8.6 Neutrophils 39.0 - 72.0 % 52.7 Lymphocytes 17.0 - 50.0 %  30.6 Monocytes 4.0 - 12.0 % 11.2 Eosinophils 0.0 - 5.0 % 4.2 Basophils 0.0 - 1.4 % 1.0 Immature Granulocytes 0.0 - 1.0 % 0.3 nRBC 0.0 - 1.0 % 4.9 (H) ANC (Abs Neutrophil Count) 2.00 - 7.60 x 1000/?L 3.63 Absolute Lymphocyte Count 0.60 - 3.70 x 1000/?L 2.11 Monocytes (Absolute) 0.00 - 1.00 x 1000/?L 0.77 Eosinophil Absolute Count 0.00 - 1.00 x 1000/?L 0.29 Basophils Absolute 0.00 - 1.00 x 1000/?L 0.07 Immature Granulocytes (Abs) 0.00 - 0.30 x 1000/?L 0.02 nRBC Absolute 0.00 - 1.00 x 1000/?L 0.34 Retic [place] Pct 0.6 - 2.7 % 8.1 (H) Absolute Reticulocyte Count 0.023 - 0.140 10?6 cells/uL 0.175 (H) Immature Retic Fract 3.0 - 15.9 % 37.8 (H) Reticulocyte Hgb Equivalent 28.2 - 35.7 pg 39.1 (H) Hemoglobin A2 0.0 - 4.0 % 3.6 Hemoglobin S 0 % 83.5 (H) Hemoglobin F 0.0 - 2.0 % 12.9 (H) (H): Data is abnormally high(L): Data is abnormally lowImp/PlanCovid-19?	07/28/2021:  VaccinatedSickle cell disease (Hbxx)?	02/18/2019: On hydroxyurea 500 mg 3 tab daily. Reluctant to increase dose. Sent names of 3 new drugs, glutamine, voxelotor, crizanlizumab. ?	07/28/2021:  Discussed increasing hydroxyuea, not interested.  Discuss Voxelotor at next visit. ?	09/27/2021:  Re educated on disease modifying therapy. Labs appropriate. Discuss increasing hydroxyurea 2000 mg daily with plan on utilizing maximum dose as tolerated.  He declined increasing hydroxyurea dose. Transfusion Hx?	09/11/2019: [facility] in [place]. Social situation?	Lives: parents in [place]. 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 Interests: hanging out, applying for jobs?	01/16/2020: Continues to live with parents. 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 Also considering moving to [employer] (college there).  ?	03/18/2020:  Starting a new job in 1 week. ?	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	09/27/2021: Plans to look for another job.Femur Fx7/15/2021: Injury/surgery 12/20/19. Discharged on apixaban, apparently stopped it early. 03/18/2020:  In follow up with Ortho. 05/18/2020: Would like to return back to having physical therapy, he will communicate with Orthopedic.07/13/2020:  Has follow up with Ortho tomorrow. 09/14/2020:  Scheduled for surgery on 09/21/2020. Lab work obtained today. Discussed hct goal 28-30 prior to surgery. 07/28/2021: Has scheduled appointment with Orthopedics.Intermittent pain1/25/2023:  Continue oxcodone 15 mg #60, oxycontin 15 mg #60Medical marijuana?	Discussed 07/17/17. Certified and accessed thereafter. ?	10/09/2019: Re-certified 3/21.?	01/16/2020: Trying to be clean.Health MaintenanceFYI: 3/10/2021Urine tox screen: OK 07/17/17.PMP: OK 2/8/2023Naloxone: in possession 3/10/2021Urine prot/creat ratio: 0.09 1/19Iron status: 643 7/17IV access: OKCigarettes: noAlcohol: occassionalEye: Sees community MD ophth Dr Luanne Bras: reports q50mVaccine/AntibodiesInfluenza: 10/2019PPV23: 10/15, 3/14PCV13: 7/12Men-4: 7/14, 7/11Men-B: 7/19Tdap: 4/21Hep A: Ab pos 1/19Hep B: sAb neg 1/19.  Dose #1 09/07/17; Dose #2 04/16/18 #3 4/07/2021Hep C: Ab neg 1/19Varicella: Ab pos 8/18. HPV: 7/19, 7/14, 8/13HIV: neg 1/19GuidelineInfluenza	Annually				Varicella	Evidence of immunity:PPV23		2 doses 5 y apart; peds doses count			Hx vaccine x 2		3rd dose age 88 					[place] born before 21		8 wks from PCV13					Hx varicella			1 mo from Men-4/Men-B 				Hx varicella zosterPCV13		1 lifetime dose						Ab pos		1 y from PPV23								1 mo from Men-4/Men-B 		Zoster		60+Men-4		2 or 3 doses depending upon vaccine		2 mo apart		1 mo from PPV23/PCV13		HPV		Women thru age 26Men-B		2 doses 2 mo apart		1 mo from PPV23/PCV13				0, 1, 4 moTdap		Every 10 years				HIV		Ab screenHep A		2 doses 6 mo apartHep B		0, 1, 4 moHep C		Ab screen onceDispositionRTC in  2 monthsJoanna [place], APRN

## 2021-10-25 ENCOUNTER — Telehealth
Admit: 2021-10-25 | Payer: PRIVATE HEALTH INSURANCE | Attending: Family | Primary: Student in an Organized Health Care Education/Training Program

## 2021-10-25 ENCOUNTER — Encounter
Admit: 2021-10-25 | Payer: PRIVATE HEALTH INSURANCE | Attending: Family | Primary: Student in an Organized Health Care Education/Training Program

## 2021-10-25 DIAGNOSIS — D571 Sickle-cell disease without crisis: Secondary | ICD-10-CM

## 2021-10-25 MED ORDER — OXYCODONE IMMEDIATE RELEASE 15 MG TABLET
15 mg | ORAL_TABLET | ORAL | 1 refills | Status: AC | PRN
Start: 2021-10-25 — End: 2021-11-19

## 2021-10-25 MED ORDER — OXYCODONE ER 15 MG TABLET,CRUSH RESISTANT,EXTENDED RELEASE 12 HR
15 mg | ORAL_TABLET | Freq: Two times a day (BID) | ORAL | 1 refills | Status: AC
Start: 2021-10-25 — End: 2021-11-19

## 2021-10-25 NOTE — Telephone Encounter
Called patient to advise him that I am forwarding over his medication refill requests to [facility] and that he should contact his pharmacy about refilling hydroxyurea, as he should have refills left. I advised him to call us back if there are no refills for hydroxyurea.

## 2021-10-25 NOTE — Telephone Encounter
Patient called office requesting a refill on oxyCODONE (OXYCONTIN) 15 mg 12 hr extended release tablet,    oxyCODONE (ROXICODONE) 15 mg Immediate Release tablet, and  hydroxyurea (HYDREA) 500 mg capsule called into [id number] - [place], [place] - [address]

## 2021-11-02 ENCOUNTER — Telehealth
Admit: 2021-11-02 | Payer: PRIVATE HEALTH INSURANCE | Attending: Student in an Organized Health Care Education/Training Program | Primary: Student in an Organized Health Care Education/Training Program

## 2021-11-02 ENCOUNTER — Encounter
Admit: 2021-11-02 | Payer: BLUE CROSS/BLUE SHIELD | Attending: Student in an Organized Health Care Education/Training Program | Primary: Student in an Organized Health Care Education/Training Program

## 2021-11-02 ENCOUNTER — Encounter
Admit: 2021-11-02 | Payer: PRIVATE HEALTH INSURANCE | Attending: Student in an Organized Health Care Education/Training Program | Primary: Student in an Organized Health Care Education/Training Program

## 2021-11-12 ENCOUNTER — Emergency Department
Admit: 2021-11-12 | Payer: BLUE CROSS/BLUE SHIELD | Primary: Student in an Organized Health Care Education/Training Program

## 2021-11-12 ENCOUNTER — Inpatient Hospital Stay
Admit: 2021-11-12 | Discharge: 2021-11-18 | Payer: BLUE CROSS/BLUE SHIELD | Attending: Internal Medicine | Admitting: Rheumatology

## 2021-11-12 ENCOUNTER — Ambulatory Visit
Admit: 2021-11-12 | Payer: BLUE CROSS/BLUE SHIELD | Primary: Student in an Organized Health Care Education/Training Program

## 2021-11-12 DIAGNOSIS — D5700 Hb-SS disease with crisis, unspecified: Secondary | ICD-10-CM

## 2021-11-12 LAB — RETICULOCYTES
BKR WAM IRF: 23.8 % — ABNORMAL HIGH (ref 3.0–15.9)
BKR WAM RETICULOCYTE - ABS (3 DEC): 0.137 10Ë6 cells/uL (ref 0.023–0.140)
BKR WAM RETICULOCYTE COUNT PCT (1 DEC): 7.2 % — ABNORMAL HIGH (ref 0.6–2.7)
BKR WAM RETICULOCYTE HGB EQUIVALENT: 29.9 pg (ref 28.2–35.7)

## 2021-11-12 LAB — CBC WITH AUTO DIFFERENTIAL
BKR WAM ABSOLUTE NRBC (2 DEC): 0.15 x 1000/ÂµL (ref 0.00–1.00)
BKR WAM HEMATOCRIT (2 DEC): 20.5 % — ABNORMAL LOW (ref 38.50–50.00)
BKR WAM HEMOGLOBIN: 7 g/dL — ABNORMAL LOW (ref 13.2–17.1)
BKR WAM MCH (PG): 36.6 pg — ABNORMAL HIGH (ref 27.0–33.0)
BKR WAM MCHC: 34.1 g/dL (ref 31.0–36.0)
BKR WAM MCV: 107.3 fL — ABNORMAL HIGH (ref 80.0–100.0)
BKR WAM MPV: 9.6 fL (ref 8.0–12.0)
BKR WAM NUCLEATED RED BLOOD CELLS: 0.7 % (ref 0.0–1.0)
BKR WAM PLATELETS: 558 x1000/ÂµL — ABNORMAL HIGH (ref 150–420)
BKR WAM RDW-CV: 14.5 % (ref 11.0–15.0)
BKR WAM RED BLOOD CELL COUNT.: 1.91 M/ÂµL — ABNORMAL LOW (ref 4.00–6.00)
BKR WAM WHITE BLOOD CELL COUNT: 21.6 x1000/ÂµL — ABNORMAL HIGH (ref 4.0–11.0)

## 2021-11-12 LAB — HEPATIC FUNCTION PANEL
BKR A/G RATIO: 1.3 (ref 1.0–2.2)
BKR ALANINE AMINOTRANSFERASE (ALT): 32 U/L (ref 9–59)
BKR ALBUMIN: 4 g/dL (ref 3.6–4.9)
BKR ALKALINE PHOSPHATASE: 136 U/L — ABNORMAL HIGH (ref 9–122)
BKR ASPARTATE AMINOTRANSFERASE (AST): 34 U/L (ref 10–35)
BKR AST/ALT RATIO: 1.1
BKR BILIRUBIN DIRECT: 0.9 mg/dL — ABNORMAL HIGH (ref <=0.3)
BKR BILIRUBIN TOTAL: 3.4 mg/dL — ABNORMAL HIGH (ref <=1.2)
BKR GLOBULIN: 3.1 g/dL (ref 2.3–3.5)
BKR PROTEIN TOTAL: 7.1 g/dL (ref 6.6–8.7)

## 2021-11-12 LAB — BASIC METABOLIC PANEL
BKR ANION GAP: 13 (ref 7–17)
BKR BLOOD UREA NITROGEN: 13 mg/dL (ref 6–20)
BKR BUN / CREAT RATIO: 14 (ref 8.0–23.0)
BKR CALCIUM: 8.8 mg/dL (ref 8.8–10.2)
BKR CHLORIDE: 96 mmol/L — ABNORMAL LOW (ref 98–107)
BKR CO2: 23 mmol/L (ref 20–30)
BKR CREATININE: 0.93 mg/dL (ref 0.40–1.30)
BKR EGFR, CREATININE (CKD-EPI 2021): >60 mL/min/{1.73_m2} (ref >=60)
BKR GLUCOSE: 128 mg/dL — ABNORMAL HIGH (ref 70–100)
BKR POTASSIUM: 3.7 mmol/L (ref 3.3–5.3)
BKR SODIUM: 132 mmol/L — ABNORMAL LOW (ref 136–144)

## 2021-11-12 LAB — MANUAL DIFFERENTIAL
BKR WAM BASOPHIL - ABS (DIFF) 2 DEC: 0 x 1000/ÂµL (ref 0.00–1.00)
BKR WAM BASOPHILS (DIFF): 0 % (ref 0.0–1.4)
BKR WAM EOSINOPHILS (DIFF) 2 DEC: 0 x 1000/ÂµL (ref 0.00–1.00)
BKR WAM EOSINOPHILS (DIFF): 0 % (ref 0.0–5.0)
BKR WAM LYMPHOCYTE - ABS (DIFF) 2 DEC: 1.08 x 1000/ÂµL (ref 0.60–3.70)
BKR WAM LYMPHOCYTES (DIFF): 5 % — ABNORMAL LOW (ref 17.0–50.0)
BKR WAM MONOCYTE - ABS (DIFF) 2 DEC: 1.73 x 1000/ÂµL — ABNORMAL HIGH (ref 0.00–1.00)
BKR WAM MONOCYTES (DIFF): 8 % (ref 4.0–12.0)
BKR WAM NEUTROPHILS (DIFF): 87 % — ABNORMAL HIGH (ref 39.0–72.0)
BKR WAM NEUTROPHILS - ABS (DIFF) 2 DEC: 18.79 x 1000/ÂµL — ABNORMAL HIGH (ref 2.00–7.60)

## 2021-11-12 LAB — INFLUENZA A+B/RSV BY RT-PCR
BKR INFLUENZA A: NEGATIVE
BKR INFLUENZA B: NEGATIVE
BKR RESPIRATORY SYNCYTIAL VIRUS: NEGATIVE

## 2021-11-12 LAB — REFLEX LACTIC ACID, PLASMA: BKR LACTATE: 0.7 mmol/L (ref 0.5–2.2)

## 2021-11-12 LAB — MAGNESIUM: BKR MAGNESIUM: 1.9 mg/dL (ref 1.7–2.4)

## 2021-11-12 LAB — SEDIMENTATION RATE (ESR): BKR SEDIMENTATION RATE, ERYTHROCYTE: 36 mm/h — ABNORMAL HIGH (ref 0–20)

## 2021-11-12 LAB — HAPTOGLOBIN: BKR HAPTOGLOBIN: 60 mg/dL (ref 30–200)

## 2021-11-12 LAB — ZZZMRSA BY PCR- VANCO RX (PHARMACY USE ONLY) (YH): BKR MRSA COLONIZATION STATUS PCR: NOT DETECTED

## 2021-11-12 LAB — SARS COV-2 (COVID-19) RNA: BKR SARS-COV-2 RNA (COVID-19) (YH): NEGATIVE

## 2021-11-12 LAB — C-REACTIVE PROTEIN     (CRP): BKR C-REACTIVE PROTEIN, HIGH SENSITIVITY: 226.1 mg/L — ABNORMAL HIGH

## 2021-11-12 MED ORDER — PIPERACILLIN-TAZOBACTAM (ZOSYN) 4.5GM MBP
Freq: Four times a day (QID) | INTRAVENOUS | Status: DC
Start: 2021-11-12 — End: 2021-11-14
  Administered 2021-11-13 – 2021-11-14 (×6): 100.000 mL/h via INTRAVENOUS

## 2021-11-12 MED ORDER — KETOROLAC 30 MG/ML (1 ML) INJECTION SOLUTION
30 mg/mL (1 mL) | Freq: Once | INTRAVENOUS | Status: CP
Start: 2021-11-12 — End: ?
  Administered 2021-11-12: 09:00:00 30 mL via INTRAVENOUS

## 2021-11-12 MED ORDER — PIPERACILLIN-TAZOBACTAM (ZOSYN) 4.5GM MBP - ED FIRST DOSE
Freq: Once | INTRAVENOUS | Status: DC
Start: 2021-11-12 — End: 2021-11-13

## 2021-11-12 MED ORDER — HYDROMORPHONE 1 MG/ML INJECTION SYRINGE
1 mg/mL | Freq: Once | INTRAVENOUS | Status: CP
Start: 2021-11-12 — End: ?
  Administered 2021-11-13: 03:00:00 1 mL via INTRAVENOUS

## 2021-11-12 MED ORDER — LACTATED RINGERS IV BOLUS (NEW BAG)
Freq: Once | INTRAVENOUS | Status: CP
Start: 2021-11-12 — End: ?
  Administered 2021-11-12: 09:00:00 1000.000 mL/h via INTRAVENOUS

## 2021-11-12 MED ORDER — PIPERACILLIN-TAZOBACTAM (ZOSYN) 4.5GM MBP
Freq: Four times a day (QID) | INTRAVENOUS | Status: DC
Start: 2021-11-12 — End: 2021-11-13
  Administered 2021-11-12 (×2): 100.000 mL/h via INTRAVENOUS

## 2021-11-12 MED ORDER — HYDROMORPHONE 1 MG/ML INJECTION SYRINGE
1 mg/mL | INTRAVENOUS | Status: CP
Start: 2021-11-12 — End: ?
  Administered 2021-11-12 (×3): 1 mL via INTRAVENOUS

## 2021-11-12 MED ORDER — HYDROMORPHONE 1 MG/ML INJECTION SYRINGE
1 mg/mL | INTRAVENOUS | Status: DC | PRN
Start: 2021-11-12 — End: 2021-11-13

## 2021-11-12 MED ORDER — LACTATED RINGERS IV BOLUS (NEW BAG)
Freq: Once | INTRAVENOUS | Status: CP
Start: 2021-11-12 — End: ?
  Administered 2021-11-12: 12:00:00 1000.000 mL/h via INTRAVENOUS

## 2021-11-12 MED ORDER — HYDROMORPHONE 1 MG/ML INJECTION SYRINGE
1 mg/mL | INTRAVENOUS | Status: DC | PRN
Start: 2021-11-12 — End: 2021-11-12
  Administered 2021-11-12: 09:00:00 1 mL via INTRAVENOUS

## 2021-11-12 MED ORDER — HYDROMORPHONE 1 MG/ML INJECTION SYRINGE
1 mg/mL | INTRAVENOUS | Status: DC | PRN
Start: 2021-11-12 — End: 2021-11-13
  Administered 2021-11-12 – 2021-11-13 (×4): 1 mL via INTRAVENOUS

## 2021-11-12 MED ORDER — PIPERACILLIN-TAZOBACTAM (ZOSYN) 4.5GM MBP - ED FIRST DOSE
Freq: Once | INTRAVENOUS | Status: CP
Start: 2021-11-12 — End: ?
  Administered 2021-11-12: 10:00:00 100.000 mL/h via INTRAVENOUS

## 2021-11-12 MED ORDER — ACETAMINOPHEN 325 MG TABLET
325 mg | Freq: Once | ORAL | Status: CP
Start: 2021-11-12 — End: ?
  Administered 2021-11-12: 23:00:00 325 mg via ORAL

## 2021-11-12 MED ORDER — ACETAMINOPHEN 325 MG TABLET
325 mg | Freq: Once | ORAL | Status: CP
Start: 2021-11-12 — End: ?
  Administered 2021-11-12: 10:00:00 325 mg via ORAL

## 2021-11-12 MED ORDER — PIPERACILLIN-TAZOBACTAM (ZOSYN) 4.5GM MBP
Freq: Four times a day (QID) | INTRAVENOUS | Status: DC
Start: 2021-11-12 — End: 2021-11-13

## 2021-11-12 MED ORDER — HYDROMORPHONE 2 MG/ML INJECTION SOLUTION
2 mg/mL | Status: CP
Start: 2021-11-12 — End: ?

## 2021-11-12 MED ORDER — OXYCODONE IMMEDIATE RELEASE 15 MG TABLET
15 mg | Freq: Once | ORAL | Status: DC
Start: 2021-11-12 — End: 2021-11-12

## 2021-11-12 MED ORDER — VANCOMYCIN 1 G IN 250 ML IVPB (VIALMATE)
Freq: Two times a day (BID) | INTRAVENOUS | Status: CP
Start: 2021-11-12 — End: ?
  Administered 2021-11-12 (×2): 250.000 mL/h via INTRAVENOUS

## 2021-11-12 MED ORDER — LACTATED RINGERS IV BOLUS (NEW BAG)
Freq: Once | INTRAVENOUS | Status: CP
Start: 2021-11-12 — End: ?
  Administered 2021-11-12: 11:00:00 1000.000 mL/h via INTRAVENOUS

## 2021-11-12 NOTE — ED Notes
4:02 AM Pt arrives to room with complaint of fever, chills, vomiting and sickle cell crisis for a few days, states that he slept wrong on his L leg a few nights ago and since then it has been in pain and he now feels he is in sickle cell crisis, pt has history of metal plate in L leg a year ago, pt uses a cane sometimes to ambulate, describe leg and lower back pain 9/10, pt tachycardic, febrile on arrival to room, states when he gets up he is experiencing dizziness as wellPast Medical History: Diagnosis Date ? Aplastic crisis (HC Code) (HC CODE) (HC Code) 6/6/ 2005 transfusion ? Avascular necrosis of femur head, left (HC Code) (HC CODE) (HC Code)  ? Chronic pain   sickle cell ? Conductive hearing loss 09/22/14 P.E tubes placed ? Dactylitis one episode  April, 1998 ? GERD (gastroesophageal reflux disease)  ? 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 On hydroxyurea therapy started October 2013 ? Pneumococcal vaccination given 01/20/2006 and 09/28/2012 ? 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 Vasoocclusive sickle cell crisis (HC Code) (HC CODE) (HC Code) Adm 01/02/2012, ER 04/08/12, 4/1-10/03/12, 7/2-01/05/13, 12/02/13-12/03/13 , March 2016 - 2 admissions

## 2021-11-12 NOTE — Other
[facility] Initial ConsultationReason for consultation:R/o L femur osteomyelitisHPI:Phillip Bell is a 26 y.o. 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 Artist Pais (original [facility] injury s/p IMN L femur 12/2019) now presenting to ED with fever, L thigh pain who was seen in the [facility] ED on 11/12/2021. He was in his usual state of health until Tuesday morning, when he awoke at home after flying back from [facility] [place] at sister's baby shower and experienced nauesa/emesis x1, then states he had been sleeping on his L leg. Since then, increasing pain and chills, presented to ED when did not improve and states he believes this feels like previous sickle cell crises for him. He states he had a similar episode of swelling, pain in his R arm in college that improved with antibiotics. Denies acute trauma, paresthesias, drainage. States he currently has maximal pain with WB. Of note, last seen in clinic with Dr. Artist Pais 10/05/21 and was doing well overall without any concerns for infection at that time.He denies other injuries. He denies loss of consciousness.PMHPast Medical History: Diagnosis Date ? Aplastic crisis (HC Code) (HC CODE) (HC Code) 6/6/ 2005 transfusion ? Avascular necrosis of femur head, left (HC Code) (HC CODE) (HC Code)  ? Chronic pain   sickle cell ? Conductive hearing loss 09/22/14 P.E tubes placed ? Dactylitis one episode  April, 1998 ? GERD (gastroesophageal reflux disease)  ? 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 On hydroxyurea therapy started October 2013 ? Pneumococcal vaccination given 01/20/2006 and 09/28/2012 ? 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 Vasoocclusive sickle cell crisis (HC Code) (HC CODE) (HC Code) Adm 01/02/2012, ER 04/08/12, 4/1-10/03/12, 7/2-01/05/13, 12/02/13-12/03/13 , March 2016 - 2 admissions PSHPast Surgical History: Procedure Laterality Date ? CHOLECYSTECTOMY, LAPAROSCOPIC  1/15 ? TYMPANOSTOMY TUBE PLACEMENT  09/22/2014  by Dr. Bernita Raisin AllergiesAllergies Allergen Reactions ? Nickel Rash Outpatient MedicationsCurrent Outpatient Medications Medication Instructions ? hydroxyurea (HYDREA) 500 mg capsule 3 tab daily for blood. ? ibuprofen (ADVIL,MOTRIN) 600 mg tablet Up to 4 tablets daily as needed for pain. ? naloxone (NARCAN) 4 mg/actuation nasal spray Use 1 spray in 1 nostril for suspected opioid overdose. May repeat in 2 minutes in other nostril with new device if minimal or no response. ? oxyCODONE (OXYCONTIN) 15 mg, Oral, EVERY 12 HOURS ? oxyCODONE (ROXICODONE) 15 mg, Oral, EVERY 4 HOURS PRN Illicit Drug UseSocial History Substance and Sexual Activity Drug Use Not Currently ? Types: Marijuana  Comment: last used on Tuesday SmokingTobacco Use: Low Risk  ? Smoking Tobacco Use: Never ? Smokeless Tobacco Use: Never ? Passive Exposure: Not on file Living SituationLives in [place] Ambulatory status pre-injuryUses cane occasionallyReview of systemsConstitutional: Endorses fevers, chillsENT: Denies any changesEyes: Denies any acute visual changesCardiovascular: Denies chest pain, palpitationsRespiratory: Denies difficulty breathing, other respiratory changesGastrointestinal: Denies constipation, diarrheaGenitourinary: Denies changes in urinationMusculoskeletal: Besides Chief Complaint, does other musculoskeletal pain/changesSkin: Denies any skin changes aside from what is stated in HPINeurologic: Denies any acute neurologic changesPsychiatric: Denies acute psychiatric changesEndocrine: Denies any endocrine changesHematologic: Denies any hematologic changesAllergic: Denies any allergic reactions/changes besides up to date allergy listPhysical ExaminationVitals:  11/12/21 0701 BP:  Pulse:  Resp:  Temp: (!) 101.9 ?F (38.8 ?C) Constitutional:NAD, Awake, AlertPsychiatric:Normal Mood/AffectNeurologic:Aox4Cardiac:Regular rate on peripheral palpationPulmonary:Breathing comfortable on RAGI:Abdomen SoftGU:DeferredLLE:Skin intact, mild swelling and erythema over incision, which is moderately TTPSensation present grossly tn/sp/dpAble to  flex/extend hips, knees, ankle, toes+ehl/fhl/ta/gastrosoleus/peronealstoes warm and well perfused, palpable DP pulse, cap refill <2sImaging / studies:X-rays of the L femur show intact L femur hardware without evidence of osteomyeltis. XRs pelvis, L tib fib unremarkable without evidence of fracture, dislocations, or OM (known dysplastic L hip).Labs:- Lactate 0.7- Blood cultures x 2 11/12/21 NGTD this AM- COVID, respiratory panel negative- ESR 36- WBC 21.6Assessment and Plan of Care:   Phillip Bell is a 26 y.o. 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 Artist Pais now presenting with fevers, L femur pain, and elevated WBC/ESR/CRP c/f possible OM- No acute interventions at this time, agree with medical management of possible sickle cell crisis in interim given pt states this overall feels like one of my sickle cell episodes- Please obtain MRI L Femur (total) w & wo IV contrast and metal suppression to eval for possible L femur OM- F/u blood cultures, currently NGTD- Final recommendations pending attending review of MRI imagingD/w Dr. Rona Ravens, MDReachable by [facility] Follow-upY[phone]

## 2021-11-12 NOTE — ED Notes
7:18 AM Report received from outgoing RN. Assumed pt care.Past Medical History: Diagnosis Date ? Aplastic crisis (HC Code) (HC CODE) (HC Code) 6/6/ 2005 transfusion ? Avascular necrosis of femur head, left (HC Code) (HC CODE) (HC Code)  ? Chronic pain   sickle cell ? Conductive hearing loss 09/22/14 P.E tubes placed ? Dactylitis one episode  April, 1998 ? GERD (gastroesophageal reflux disease)  ? 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 On hydroxyurea therapy started October 2013 ? Pneumococcal vaccination given 01/20/2006 and 09/28/2012 ? 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 Vasoocclusive sickle cell crisis (HC Code) (HC CODE) (HC Code) Adm 01/02/2012, ER 04/08/12, 4/1-10/03/12, 7/2-01/05/13, 12/02/13-12/03/13 , March 2016 - 2 admissions 8:06 AMPt stating 10/10 left leg and lower back pain. Pt did not receive his full 3 doses of dilaudid at 5am. MD Tanner aware8:13 AMPt to xray with transport. NAD upon departure. Next dilaudid due at 8:36am 8:37 AM Pt medicated per mar. Next dose due at 9:07am. 9:07 AM Pt medicated per mar. Pt stating pain in lower back 6/10 and left leg pain 8/10.12:32 PMPending MRI to be done at 7:00 pm. Pt resting comfortably on stretcher. Equal chest rise and fall noted with non-labored breathing. 3:15 PM Pt endorsing 10/10 leg pain. MD Tanner aware and orders to follow 3:45 PM Pt medicated per mar. Pt provided water, jello and crackers. Pending MRI. Next PRN dose of dilaudid due at 19404:26 PMPt ambulatory to the bathroom with a steady gait. NAD.

## 2021-11-13 LAB — COMPREHENSIVE METABOLIC PANEL
BKR A/G RATIO: 1.1 (ref 1.0–2.2)
BKR ALANINE AMINOTRANSFERASE (ALT): 35 U/L (ref 9–59)
BKR ALBUMIN: 3.7 g/dL (ref 3.6–4.9)
BKR ALKALINE PHOSPHATASE: 165 U/L — ABNORMAL HIGH (ref 9–122)
BKR ANION GAP: 10 (ref 7–17)
BKR ASPARTATE AMINOTRANSFERASE (AST): 47 U/L — ABNORMAL HIGH (ref 10–35)
BKR AST/ALT RATIO: 1.3
BKR BILIRUBIN TOTAL: 2.1 mg/dL — ABNORMAL HIGH (ref <=1.2)
BKR BLOOD UREA NITROGEN: 8 mg/dL (ref 6–20)
BKR BUN / CREAT RATIO: 11.3 (ref 8.0–23.0)
BKR CALCIUM: 8.9 mg/dL (ref 8.8–10.2)
BKR CHLORIDE: 100 mmol/L (ref 98–107)
BKR CO2: 25 mmol/L (ref 20–30)
BKR CREATININE: 0.71 mg/dL (ref 0.40–1.30)
BKR EGFR, CREATININE (CKD-EPI 2021): >60 mL/min/{1.73_m2} (ref >=60)
BKR GLOBULIN: 3.5 g/dL (ref 2.3–3.5)
BKR GLUCOSE: 103 mg/dL — ABNORMAL HIGH (ref 70–100)
BKR POTASSIUM: 3.7 mmol/L (ref 3.3–5.3)
BKR PROTEIN TOTAL: 7.2 g/dL (ref 6.6–8.7)
BKR SODIUM: 135 mmol/L — ABNORMAL LOW (ref 136–144)

## 2021-11-13 LAB — CBC WITH AUTO DIFFERENTIAL
BKR WAM ABSOLUTE IMMATURE GRANULOCYTES.: 0.19 x 1000/ÂµL (ref 0.00–0.30)
BKR WAM ABSOLUTE LYMPHOCYTE COUNT.: 1.86 x 1000/ÂµL (ref 0.60–3.70)
BKR WAM ABSOLUTE NRBC (2 DEC): 0.06 x 1000/ÂµL (ref 0.00–1.00)
BKR WAM ANALYZER ANC: 13.28 x 1000/ÂµL — ABNORMAL HIGH (ref 2.00–7.60)
BKR WAM BASOPHIL ABSOLUTE COUNT.: 0.05 x 1000/ÂµL (ref 0.00–1.00)
BKR WAM BASOPHILS: 0.3 % (ref 0.0–1.4)
BKR WAM EOSINOPHIL ABSOLUTE COUNT.: 0.17 x 1000/ÂµL (ref 0.00–1.00)
BKR WAM EOSINOPHILS: 1 % (ref 0.0–5.0)
BKR WAM HEMATOCRIT (2 DEC): 19.7 % — ABNORMAL LOW (ref 38.50–50.00)
BKR WAM HEMOGLOBIN: 7.1 g/dL — ABNORMAL LOW (ref 13.2–17.1)
BKR WAM IMMATURE GRANULOCYTES: 1.1 % — ABNORMAL HIGH (ref 0.0–1.0)
BKR WAM LYMPHOCYTES: 10.6 % — ABNORMAL LOW (ref 17.0–50.0)
BKR WAM MCH (PG): 36.6 pg — ABNORMAL HIGH (ref 27.0–33.0)
BKR WAM MCHC: 36 g/dL (ref 31.0–36.0)
BKR WAM MCV: 101.5 fL — ABNORMAL HIGH (ref 80.0–100.0)
BKR WAM MONOCYTE ABSOLUTE COUNT.: 2 x 1000/ÂµL — ABNORMAL HIGH (ref 0.00–1.00)
BKR WAM MONOCYTES: 11.4 % (ref 4.0–12.0)
BKR WAM MPV: 9.5 fL (ref 8.0–12.0)
BKR WAM NEUTROPHILS: 75.6 % — ABNORMAL HIGH (ref 39.0–72.0)
BKR WAM NUCLEATED RED BLOOD CELLS: 0.3 % (ref 0.0–1.0)
BKR WAM PLATELETS: 653 x1000/ÂµL — ABNORMAL HIGH (ref 150–420)
BKR WAM RDW-CV: 15.1 % — ABNORMAL HIGH (ref 11.0–15.0)
BKR WAM RED BLOOD CELL COUNT.: 1.94 M/ÂµL — ABNORMAL LOW (ref 4.00–6.00)
BKR WAM WHITE BLOOD CELL COUNT: 17.6 x1000/ÂµL — ABNORMAL HIGH (ref 4.0–11.0)

## 2021-11-13 MED ORDER — ACETAMINOPHEN 325 MG TABLET
325 mg | Freq: Four times a day (QID) | ORAL | Status: DC | PRN
Start: 2021-11-13 — End: 2021-11-14
  Administered 2021-11-14: 03:00:00 325 mg via ORAL

## 2021-11-13 MED ORDER — GADOTERATE MEGLUMINE 0.5 MMOL/ML (376.9 MG/ML) INTRAVENOUS SOLUTION
0.5 mmol/mL (376.9 mg/mL) | Freq: Once | INTRAVENOUS | Status: CP | PRN
Start: 2021-11-13 — End: ?
  Administered 2021-11-13: 05:00:00 0.5 mL via INTRAVENOUS

## 2021-11-13 MED ORDER — HYDROXYUREA 500 MG CAPSULE
500 mg | Freq: Every day | ORAL | Status: DC
Start: 2021-11-13 — End: 2021-11-19
  Administered 2021-11-14 – 2021-11-18 (×5): 500 mg via ORAL

## 2021-11-13 MED ORDER — VANCOMYCIN MAR LEVEL
Freq: Once | INTRAVENOUS | Status: DC
Start: 2021-11-13 — End: 2021-11-14

## 2021-11-13 MED ORDER — VANCOMYCIN MAR LEVEL
Freq: Once | INTRAVENOUS | Status: CP
Start: 2021-11-13 — End: ?

## 2021-11-13 MED ORDER — KETOROLAC 30 MG/ML (1 ML) INJECTION SOLUTION
30 mg/mL (1 mL) | Freq: Once | INTRAVENOUS | Status: CP
Start: 2021-11-13 — End: ?
  Administered 2021-11-13: 08:00:00 30 mL via INTRAVENOUS

## 2021-11-13 MED ORDER — SODIUM CHLORIDE 0.9 % (FLUSH) INJECTION SYRINGE
0.9 % | INTRAVENOUS | Status: DC | PRN
Start: 2021-11-13 — End: 2021-11-19

## 2021-11-13 MED ORDER — HYDROMORPHONE (DILAUDID) PCA 1 MG/ML (50 ML) YNH PYXIS
1 mg/ml | INTRAVENOUS | Status: DC
Start: 2021-11-13 — End: 2021-11-17
  Administered 2021-11-13 – 2021-11-16 (×3): 1 mL via INTRAVENOUS

## 2021-11-13 MED ORDER — VANCOMYCIN 1 G IN 250 ML IVPB (VIALMATE)
Freq: Three times a day (TID) | INTRAVENOUS | Status: DC
Start: 2021-11-13 — End: 2021-11-14
  Administered 2021-11-13 – 2021-11-14 (×4): 250.000 mL/h via INTRAVENOUS

## 2021-11-13 MED ORDER — SODIUM CHLORIDE 0.9 % (FLUSH) INJECTION SYRINGE
0.9 % | Freq: Three times a day (TID) | INTRAVENOUS | Status: DC
Start: 2021-11-13 — End: 2021-11-19
  Administered 2021-11-14 – 2021-11-17 (×4): 0.9 mL via INTRAVENOUS

## 2021-11-13 MED ORDER — OXYCODONE IMMEDIATE RELEASE 15 MG TABLET
15 mg | ORAL | Status: DC | PRN
Start: 2021-11-13 — End: 2021-11-14

## 2021-11-13 MED ORDER — KETOROLAC 30 MG/ML (1 ML) INJECTION SOLUTION
30 mg/mL (1 mL) | Freq: Four times a day (QID) | INTRAVENOUS | Status: DC | PRN
Start: 2021-11-13 — End: 2021-11-14

## 2021-11-13 MED ORDER — ACETAMINOPHEN 325 MG TABLET
325 mg | Freq: Once | ORAL | Status: CP
Start: 2021-11-13 — End: ?
  Administered 2021-11-13: 08:00:00 325 mg via ORAL

## 2021-11-13 MED ORDER — HYDROMORPHONE 1 MG/ML INJECTION SYRINGE
1 mg/mL | Freq: Once | INTRAVENOUS | Status: CP
Start: 2021-11-13 — End: ?
  Administered 2021-11-13: 13:00:00 1 mL via INTRAVENOUS

## 2021-11-13 MED ORDER — OXYCODONE ER 15 MG TABLET,CRUSH RESISTANT,EXTENDED RELEASE 12 HR
15 mg | Freq: Two times a day (BID) | ORAL | Status: DC
Start: 2021-11-13 — End: 2021-11-19
  Administered 2021-11-13 – 2021-11-18 (×11): 15 mg via ORAL

## 2021-11-13 MED ORDER — NALOXONE 0.4 MG/ML INJECTION SOLUTION
0.4 mg/mL | INTRAVENOUS | Status: DC | PRN
Start: 2021-11-13 — End: 2021-11-19

## 2021-11-13 MED ORDER — SODIUM CHLORIDE 0.9 % INTRAVENOUS SOLUTION
INTRAVENOUS | Status: DC
Start: 2021-11-13 — End: 2021-11-19
  Administered 2021-11-13: 15:00:00 via INTRAVENOUS

## 2021-11-13 MED ORDER — IBUPROFEN 400 MG TABLET
400 mg | Freq: Once | ORAL | Status: DC
Start: 2021-11-13 — End: 2021-11-13

## 2021-11-13 NOTE — Plan of Care
Plan of Care Overview/ Patient Status    Problem: Adult Inpatient Plan of CareGoal: Readiness for Transition of CareOutcome: Initial problem identification 26 year old man presents to ED for eval of SC pain with fever, chills, and vomiting. PMHX: sickle cell anemia, metal plate in left thigh. ED work up with temp 104.3, HR 124, WBC/ESR/CRP all elevated and admitted with sepsis. Plan for MRI of left femur. Pt reports he is active/independent at baseline and parents are very supportive. He has a cane if needed. No HCS/STR in past and family can provide discharge transportation. Aware Cm available to assist as needed and will follow with IP team.Case Management Screening  Flowsheet Row Most Recent Value Case Management Screening: Chart review completed. If YES to any question below then proceed to CM Eval/Plan  Is there a change in their cognitive function No Do you anticipate a change in this patient's physicial function that will effect discharge needs? Yes Has there been a readmission within the last 30 days No Were there services prior to admission ( Examples: Assisted Living, HD, Homecare, Extended Care Facility, Methadone, SNF, Outpatient Infusion Center) No Negative/Positive Screen Negative Screening: Case Management department will follow patient's progress and discuss plan of care with treatment team. Case Manager Attestation  I have reviewed the medical record and completed the above screen. CM staff will follow patient's progress and discuss the plan of care with the Treatment Team. Yes   Case Management Evaluation  Flowsheet Row Most Recent Value Case Management Evaluation and Plan  Arrived from prior to admission home/apartment/condo Do you have a caregiver? No  Lives With Alone Services Prior to Admission none Prior to Hospitalization: Assistance Needed/DME being used Ambulation Ambulation Assistance/DME: straight cane Documented Insurance Accurate Yes Any financial concerns related to anticipated discharge needs No Patient's home address verified Yes Patient's PCP of record verified Yes Last Date Seen by PCP 0-3 months Living Environment   Lives With Alone Source of Clinical History  Patient's clinical history has been reviewed and source of Information is: Patient, Medical record Case Manager Attestation  I have reviewed the medical record and completed the above evaluation with the following recommendations. Yes Discharge Planning Coordination Recommendations  Discharge Planning Coordination Recommendations Needs not determined at this time Case Manager reviewed plan of care/ continuum of care need's with  Patient, Interdisciplinary Team   [facility] RN, MSN, ED/OBS CMOffice: [phone]: [email or web address]

## 2021-11-13 NOTE — ED Notes
8:26 AM Floor Handoff Telemetry: 	[x]  Yes		[]  NoCode Status:   []  Full		[]  DNR		[]  DNI		Other (specify):  See epicSafety Precautions: [x]  Fall Risk  []  Sitter   []  Restraints	[]  Suicidal	[]  None	Other (specify):Mentation/Orientation:	 A&O (Self, person, place, time) x    4      	 Disoriented to:                    	 Special Accommodations: []  Hearing impaired   []  Blind  []  Nonverbal  []  Cognitive impairmentOxygenation Upon Admission: [x]  RA	[]  NC	[]  Venti  []  Simple Mask []  Other	Baseline O2 Status? []  Yes	[]  NoAmbulation: []  Independent	[x]  Cane   []  Walker	[]  Wheelchair	[]  Bedbound		[]  Hemiplegic	[]  Paraplegic	[]  QuadraplegicEliminiation: [x]  Independent	[]  Commode	[]  Bedpan/Urinal  []  Straight Cath []  Foley cath			[]  Urostomy	[]  Colostomy	Other (specify):Diarrhea/Loose stool : []  1x within 24h  []  2x within 24h  []  3x within 24h  [x]  None 	C.Diff Order: 	[]  Ordered- needs to be collected             []  Collected-sent to lab             []  Resulted - Negative C.Diff             []  Resulted - Positive C.Diff[]  Not Ordered   []  N/ASkin Alteration: []  Pressure Injury []  Wound [x]  None []  Skin not assessedDiet: [x]  Regular/No order placed	[]  NPO		Other (specify):IV Access: [x]  PIV   []  PICC    []  Port    [] Central line    []  A-line    Other (specify)IVF/GTT Running Upon ED Departure? [x]  No	    []  Yes (specify):Outstanding Meds/Treatments/Tests:Patient Belongings:Are the belongings documented?          []  No	    [x]  YesIs someone taking belongings home?   [x]  No     []  Yes  Who? (specify)                                   ED RN and Contact number/MHB #:                    Bruce Donath

## 2021-11-13 NOTE — ED Notes
Family at bedside. 

## 2021-11-13 NOTE — Utilization Review (ED)
UM Status: Commercial - IP, sepsis, WBC 21.6, febrile, HR sustained 100s-120s, IV Abx.

## 2021-11-13 NOTE — H&P
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 Non bloody. No more nausea, vomiting since then. No abdominal pain, no diarrhea.Tuesday Nov 09 2021, he started having some soreness on left thigh, it was hard to walk. On Wednesday Nov 10 2021, the entire left leg was swollen. The pain got worse over time to being 10/10 in severity at rest and worse with movement. The left thigh was very tender to touch.He also started having fevers at home, chills, sweats, and headache. Started having severe low back pain which is usually associated with sickle cell crisis.No runny nose, sore throat, chest pain, cough, shortness of breath, urinary symptoms. No rash.  In the ED, vitals: temp as high as 104.3, GHR initially up to 131 but then down to 88, BP 120/71, saturating 99% on room airNa 132Total bilirubin 3.4, direct 0.9, alk phos 136CRP 226Sed rate 36WBC 21.6 from prior 6.9 on 3/27/2023H/H 7.0/20.5- similar to priorBlood culture x2 obtainedMRSA negativeCovid, RSV, flu A, flu B negativeXR pelvis: No acute osseous injury.Mildly progressive deformity of the left femoral acetabular joint.Seen by ortho team who recommended MRI L femur with and wo contrast with meta suppression to evaluate for possible L femur OMPatient received tylenol x3, dilaudid 1mg  IV x5 doses, toradol 15mg  IV x2, LR 3L, vancomycin, zosyn and admitted to medicineMedical History: Past Medical History: Diagnosis Date ? Aplastic crisis (HC Code) (HC CODE) (HC Code) 6/6/ 2005 transfusion ? Avascular necrosis of femur head, left (HC Code) (HC CODE) (HC Code)  ? Chronic pain   sickle cell ? Conductive hearing loss 09/22/14 P.E tubes placed ? Dactylitis one episode  April, 1998 ? GERD (gastroesophageal reflux disease)  ? 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 On hydroxyurea therapy started October 2013 ? Pneumococcal vaccination given 01/20/2006 and 09/28/2012 ? 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 Vasoocclusive sickle cell crisis (HC Code) (HC CODE) (HC Code) Adm 01/02/2012, ER 04/08/12, 4/1-10/03/12, 7/2-01/05/13, 12/02/13-12/03/13 , March 2016 - 2 admissions Past Medical History Pertinent Negatives: Diagnosis Date Noted ? Asthma 01/21/2016 ? Cancer (HC Code) (HC CODE) (HC Code) 01/21/2016 ? End-stage renal disease (HC Code) (HC CODE) (HC Code) 01/21/2016 ? Hepatitis 01/21/2016 ? HIV disease (HC Code) (HC CODE) (HC Code) 01/21/2016 ? Liver disease 01/21/2016 ? Obstructive sleep apnea 01/21/2016 ? PONV (postoperative nausea and vomiting) 01/21/2016 ? Temporomandibular disorder 01/21/2016  Family History Problem Relation Age of Onset ? Sickle cell trait Mother  ? Thyroid disease Mother  ? Sickle cell trait Father  ? Sarcoidosis Father  ? Diabetes Paternal Grandfather  ? Hypertension Paternal Grandfather  ? Alcohol abuse Maternal Aunt   Past Surgical History: Procedure Laterality Date ? CHOLECYSTECTOMY, LAPAROSCOPIC  1/15 ? TYMPANOSTOMY TUBE PLACEMENT  09/22/2014  by Dr. Bernita Raisin No past surgical history pertinent negatives on file. Social History Socioeconomic History ? Marital status: Single   Spouse name: Not on file ? Number  of children: Not on file ? Years of education: Not on file ? Highest education level: Not on file Occupational History ? Occupation: Soil scientist Tobacco Use ? Smoking status: Never ? Smokeless tobacco: Never Substance and Sexual Activity ? Alcohol use: Yes   Comment: beer and liquor on occasion ? Drug use: Not Currently   Types: Marijuana   Comment: last used on Tuesday ? Sexual activity: Yes Other Topics Concern ? Not on file Social History Narrative  Lives in [place].    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  Will get a Masters, Set designer in business  [employer] Social Determinants of Conservation officer, historic buildings Strain: Not on [employer] Insecurity: Not on file Transportation Needs: Not on file Physical Activity: Not on file Stress: Not on file Social Connections: Not on file Intimate Partner Violence: Not on file Housing Stability: Low Risk  ? Housing Stability: I have a steady place to live  Home Medications:  Instructions for Use hydroxyurea (HYDREA) 500 mg capsule 3 tab daily for blood. ibuprofen (ADVIL,MOTRIN) 600 mg tablet Up to 4 tablets daily as needed for pain. naloxone (NARCAN) 4 mg/actuation nasal spray Use 1 spray in 1 nostril for suspected opioid overdose. May repeat in 2 minutes in other nostril with new device if minimal or no response. oxyCODONE (OXYCONTIN) 15 mg 12 hr extended release tablet Take 1 tablet (15 mg total) by mouth every 12 (twelve) hours. oxyCODONE (ROXICODONE) 15 mg Immediate Release tablet Take 1 tablet (15 mg total) by mouth every 4 (four) hours as needed for pain. Allergies as of 11/12/2021 - Review Complete 11/12/2021 Allergen Reaction Noted ? Nickel Rash 09/21/2020 Review of Systems: GENERAL:CONSTITUTIONAL: has been having fevers, chills, night sweats HEAD: has been having headaches on and off in the last few daysEYES: no acute visual changesNOSE: no nasal dischargeMOUTH & THROAT: no sore throat, or trouble swallowingPULMONARY: no cough, or shortness of breathCARDIOVASCULAR: no chest painGASTROINTESTINAL: did have nausea and vomiting -non bloody- Monday Nov 08 2021, but nothing since then. No abdominal; pain. No diarrhea. MUSCULOSKELETAL: had 10/10 lower back pain which he reports happens when he has sickle cell crisis.SKIN: no rashNEUROLOGICAL: feels that left leg is a bit weaker. HEMATOLOGIC: no bleedingObjective: Vitals: Temp:  [97.8 ?F (36.6 ?C)-104.3 ?F (40.2 ?C)] 97.8 ?F (36.6 ?C)Pulse:  [88-124] 88Resp:  [16-18] 18BP: (105-126)/(60-69) 105/61SpO2:  [99 %-100 %] 100 %Device (Oxygen Therapy): room air Weight:   Physical Exam:Gen: lying in bed in NAD, alert, answering appropriatelyHEENT: EOMI, MMMCV: RRRPulm: CTABAbd: Soft, NT/ND, +BSExt: there is edema in left thighNeuro: A&Ox14moving all extremitiesSkin: No apparent rashesHe has good range of motion of left thigh and left kneeThere is tenderness to palpation of the lateral and posterior left thigh Labs: Recent Labs Lab 05/12/230513 WBC 21.6* HGB 7.0* HCT 20.50* PLT 558*  No results for input(s): NEUTROPHILS, LABLYMP, LABEOS, BANDSP in the last 168 hours. Recent Labs Lab 05/12/230513 NA 132* K 3.7 CL 96* CO2 23 BUN 13 CREATININE 0.93 GLU 128* ANIONGAP 13  Recent Labs Lab 05/12/230513 CALCIUM 8.8 MG 1.9  Recent Labs Lab 05/12/230513 ALT 32 AST 34 ALKPHOS 136* BILITOT 3.4* BILIDIR 0.9* PROT 7.1 ALBUMIN 4.0  No results for input(s): PTT, LABPROT, INR in the last 168 hours. Microbiology:MRSA nares negativeBlood culture x2 negativeDiagnostics:CXRResult Date: 11/12/2021 No acute cardiothoracic abnormality. [place] [facility] Notify System Classification: Routine Report Initiated By:  Vincenza Hews, MD Reported And Signed By: Antonieta Loveless,  MD  [facility] Radiology and Biomedical Imaging Femur LeftResult Date: 11/12/2021 No acute osseous abnormality. No radiographic evidence of osteomyelitis. If there is clinical concern for osteomyelitis, further evaluation with MRI would be more sensitive and allow for evaluation of extent of disease. [place] [facility] Notify System Classification: Routine Report Initiated By:  Vonzella Nipple, RRA Reported And Signed By: Antonieta Loveless, MD  [facility] Radiology and Biomedical Imaging XR Lower Leg LeftResult Date: 5/12/2023No acute osseous injury. [facility] Radiology Notify System Classification:  Routine Reported And Signed By: Bradly Bienenstock, MD  [facility] Radiology and Biomedical Imaging MRI Femur Left w wo IV Contrast Preliminary resultsResult Date: 5/13/2023MRI FEMUR LEFT W WO IV CONTRAST CLINICAL HISTORY: Osteomyelitis, femur TECHNIQUE: Multiplanar, multisequence MR of the left femur before and after IV contrast administration. COMPARISON: Left pelvis and left femur radiographs of the same day. FINDINGS: Statement: Despite use of metatarsal technique, there is significant metal artifact in the region of the femoral hardware, which precludes evaluation of bone marrow signal in the region of the hardware. There is a fluid collection along the posterior aspect of hardware which measures approximately 3.3 x 2.6 cm transaxial and 19.4 cm in craniocaudal. Sterility cannot be assessed for imaging. There is dysplastic appearance of the left hip. This exam is not tailored to assess the hip or the knee joints. There is nonspecific edema within the quadriceps musculature as well as the posterolateral subcutaneous compartment. On postcontrast images, there is a thin rim of enhancement along the fluid collection. There is mild patchy enhancement in the posterolateral subcutaneous tissue and the quadriceps muscle. This exam is not tailored to assess the intrapelvic organs. Several mildly prominent left inguinal lymph nodes are seen, possibly reactive. There is a lobular structure along the left femoral neurovascular bundle measuring approximately 12 mm short axis, could be reactive lymphadenopathy although this is incompletely visualized for assessment.  1. Technically limited exam as described which precludes evaluation of bone marrow signal in the region of hardware. Consider triple phase bone scan for further evaluation if felt clinically warranted. 2. Rim-enhancing fluid collection at the posterior aspect of the hardware as described. Sterility is indeterminate and cannot be assessed by imaging. 3. Edema and patchy mild enhancement of the posterolateral subcutaneous tissue and the lateral quadriceps musculature, nonspecific. Please correlate for cellulitis and myositis. 4. Lobular structure along the left femoral neurovascular bundle could represent reactive lymphadenopathy. This was incompletely visualized for assessment. Can consider dedicated [place] of the pelvis for further evaluation based if felt clinically warranted. This is a preliminary report. Final report will be available after attending review. Report Initiated By:  Harrel Lemon PelvisResult Date: 5/12/2023No acute osseous injury. Mildly progressive deformity of the left femoral acetabular joint. 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 Orthopedics is involved. CRP high Assessment Plan: Sepsis from left thigh infection- when I took over his care, patient was only on zosyn. However, he has cellulitis with concern  for deeper abscess, so placed vancomycin order back on- discussed case with orthopedics around 12:20pm. They are still discussing the results and case and pending further recommendations. They are ok with patient continuing to have diet for now- f/u blood culturesPain control- at home, he takes oxycontin 15mg  BID long acting as needed, alone with oxycodone 15mg  prn as needed- here placed on oxycontin 15mg  BID scheduled. Holding oxycodone 15mg  prn while on PCA- dilaudid PCA ordered as patient was requiring a lot of doses of IV dilaudid- continue tylenol for mild pain, toradol 15mg  IV prn for pain control as wellSickle cell crisis in setting of infection- pain control as above- holding hydroxyurea 1500mg  daily for now until we talk to hematology- hematology consultmeds reviewed with patient at bedsideDIET: regularPPX: SCDs pending possible proceduresDISPO:  pendingCODE STATUS: Full code- discussed with patient on admission Primary Emergency Contact: Dierolf,Katrina, Home Phone: [phone]: PCP: Drema Halon Primary Care Provider was notified of this admission. Yes. Will route H&PFamily was notified of this admission. Yes. Mom updated per patient request at 4pmPlan discussed with patient and/or family. YesSigned:Ziyan Hillmer, MD, MScHospitalist MedicineCell phone: MHBMay 13, 202310:04 AMAddendum 4pmMRI L femur results are final now- will touch base with orthopedics if there are any new recommendationsHematology team recommended speaking to MRI reading room about whether MRI can tell if there is a DVT in the left thigh or not. Spoke to Dr Lance Sell Gunduru around 4pm and she reports that MRI was not a dedicated vein study so it is hard to tell if there is DVT in the area. - ordered DVT study left lower extremity to check for DVTBlerta Coleen Cardiff MDTotal time spent .  >50% time spent on face to face encounter, counseling and coordination of care. Spoke to patient, orthopedics, radiology, hematology, and updated mother over the phone

## 2021-11-13 NOTE — ED Notes
4:54 PM Pt transferred to C1. Pt a/o. VSS on room air. Pt ambulated to BR with a cane. Transport has been ordered for this patient as IP bed is available.

## 2021-11-13 NOTE — ED Notes
7:21 AM Assumed care from outgoing nurse. Pending admission bed.7:47 AM Pt c/o left hip pain , Dilaudid 1 mg IVPush PRN administered. Oral temp is 97.36F.9:10 AM Pt still c/o back pain  8/10 pain scale, Dr. Burgess Estelle ordered x 1 dose of dilaudid 1 mg IVpush. Pending admission bed. 12:26 PM Dr. Fraser Din evaluated the patient , will continue to have regular diet, pending bed admission.
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
## 2021-11-13 NOTE — ED Notes
7:12 PM Report received from Sarah, RN, care assumed at this time.7:22 PMPt temp 104.3, MD Hosain notified.8:24 PMPt resting comfortably in stretcher, in NAD at this time, pending MRI.9:41 PMPt to MRI.12:19 AMPt returned from MRI, independent and ambulatory to restroom w/ cane, zosyn infusion initiated.3:15 AMPt medicated per [facility] for pain, in NAD at this time, VSS, pt independent and ambulatory to restroom w/ cane.4:15 AMPt temperature elevated 103.9, MD Deguardi notified.

## 2021-11-13 NOTE — Progress Notes
ORTHOPAEDICS & REHABILITATIONOrthopaedic Trauma Progress NoteDate of Surgery: 3/12/23Surgery: Surgical stabilization left femur Interim History: Pain is better controlled, no new active complaints Vitals/Labs:Temp:  [97.8 ?F (36.6 ?C)-104.3 ?F (40.2 ?C)] 97.8 ?F (36.6 ?C)Pulse:  [88-124] 97Resp:  [18] 18BP: (98-126)/(60-77) 98/61SpO2:  [99 %-100 %] 100 %SpO2:  [99 %-100 %] 100 % (05/13 1309) Lab Results Component Value Date  WBC 21.6 (H) 11/12/2021  HGB 7.0 (L) 11/12/2021  HCT 20.50 (L) 11/12/2021 Lab Results Component Value Date  CREATININE 0.93 11/12/2021 Exam:NADNon-labored breathingRegular rateLLE:Well healed surgical incisions Mild tenderness around the surgical incisions+EHL/FHL/GCS/TASILT grossly sp/dp/t nerve distributions2+ DP/PT pulseToes wwp,  CR<2sA/P: Phillip Bell is a 26 y.o. male who is s/p  Surgical stabilziation March 23 with Dr Artist Pais , presented with left thigh pain and lower back pain. ? Sickle cell crisis Orthopedic consulted to rule out surgical site infection/ osteomylitis Pending left femur MRI final report - No active Orthopedic intervention at this time- Pain management - Weight bearing as tolerated LLE - We will follow Marland Kitchen, MD 11/13/2021 Orthopedics Trauma Team:Weekdays 7:00 am - 4:30 pm: PA (first line):				Orthopaedic floor pager [phone] [place] (PGY2)		Contact [facility] Rosana Hoes  (PGY3)			Contact MHBWill Merlinda Frederick (Chief Resident)	Contact [facility] [id number] 4:30 pm - 7:00 am: Please page Orthopaedic Night Floor Resident [phone]: please check AMION/Qgenda or call operator for Orthopaedic Day/ Night Floor Resident on call

## 2021-11-13 NOTE — Other
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 Received response at 1226.-Jania Steinke Dick5/13/202312:25 [facility] [phone]

## 2021-11-14 LAB — CBC WITH AUTO DIFFERENTIAL
BKR WAM ABSOLUTE IMMATURE GRANULOCYTES.: 0.45 x 1000/ÂµL — ABNORMAL HIGH (ref 0.00–0.30)
BKR WAM ABSOLUTE LYMPHOCYTE COUNT.: 2.65 x 1000/ÂµL (ref 0.60–3.70)
BKR WAM ABSOLUTE NRBC (2 DEC): 0.03 x 1000/ÂµL (ref 0.00–1.00)
BKR WAM ANALYZER ANC: 9.79 x 1000/ÂµL — ABNORMAL HIGH (ref 2.00–7.60)
BKR WAM BASOPHIL ABSOLUTE COUNT.: 0.05 x 1000/ÂµL (ref 0.00–1.00)
BKR WAM BASOPHILS: 0.3 % (ref 0.0–1.4)
BKR WAM EOSINOPHIL ABSOLUTE COUNT.: 0.27 x 1000/ÂµL (ref 0.00–1.00)
BKR WAM EOSINOPHILS: 1.8 % (ref 0.0–5.0)
BKR WAM HEMATOCRIT (2 DEC): 19.7 % — ABNORMAL LOW (ref 38.50–50.00)
BKR WAM HEMOGLOBIN: 6.7 g/dL — ABNORMAL LOW (ref 13.2–17.1)
BKR WAM IMMATURE GRANULOCYTES: 2.9 % — ABNORMAL HIGH (ref 0.0–1.0)
BKR WAM LYMPHOCYTES: 17.4 % (ref 17.0–50.0)
BKR WAM MCH (PG): 35.8 pg — ABNORMAL HIGH (ref 27.0–33.0)
BKR WAM MCHC: 34 g/dL (ref 31.0–36.0)
BKR WAM MCV: 105.3 fL — ABNORMAL HIGH (ref 80.0–100.0)
BKR WAM MONOCYTE ABSOLUTE COUNT.: 2.05 x 1000/ÂµL — ABNORMAL HIGH (ref 0.00–1.00)
BKR WAM MONOCYTES: 13.4 % — ABNORMAL HIGH (ref 4.0–12.0)
BKR WAM MPV: 9.2 fL (ref 8.0–12.0)
BKR WAM NEUTROPHILS: 64.2 % (ref 39.0–72.0)
BKR WAM NUCLEATED RED BLOOD CELLS: 0.2 % (ref 0.0–1.0)
BKR WAM PLATELETS: 693 x1000/ÂµL — ABNORMAL HIGH (ref 150–420)
BKR WAM RDW-CV: 15.7 % — ABNORMAL HIGH (ref 11.0–15.0)
BKR WAM RED BLOOD CELL COUNT.: 1.87 M/ÂµL — ABNORMAL LOW (ref 4.00–6.00)
BKR WAM WHITE BLOOD CELL COUNT: 15.3 x1000/ÂµL — ABNORMAL HIGH (ref 4.0–11.0)

## 2021-11-14 LAB — BASIC METABOLIC PANEL
BKR ANION GAP: 11 (ref 7–17)
BKR BLOOD UREA NITROGEN: 6 mg/dL (ref 6–20)
BKR BUN / CREAT RATIO: 8.3 (ref 8.0–23.0)
BKR CALCIUM: 8.8 mg/dL (ref 8.8–10.2)
BKR CHLORIDE: 102 mmol/L (ref 98–107)
BKR CO2: 25 mmol/L (ref 20–30)
BKR CREATININE: 0.72 mg/dL (ref 0.40–1.30)
BKR EGFR, CREATININE (CKD-EPI 2021): >60 mL/min/{1.73_m2} (ref >=60)
BKR GLUCOSE: 95 mg/dL (ref 70–100)
BKR POTASSIUM: 3.7 mmol/L (ref 3.3–5.3)
BKR SODIUM: 138 mmol/L (ref 136–144)

## 2021-11-14 LAB — VANCOMYCIN, TROUGH
BKR VANCOMYCIN TROUGH: 5.6 ug/mL — ABNORMAL LOW (ref 10.0–15.0)
BKR VANCOMYCIN TROUGH: 8.4 ug/mL — ABNORMAL LOW (ref 10.0–15.0)

## 2021-11-14 MED ORDER — OXYCODONE IMMEDIATE RELEASE 15 MG TABLET
15 mg | ORAL | Status: DC
Start: 2021-11-14 — End: 2021-11-19
  Administered 2021-11-14 – 2021-11-15 (×3): 15 mg via ORAL

## 2021-11-14 MED ORDER — ZZ IMS TEMPLATE
ORAL | Status: DC
Start: 2021-11-14 — End: 2021-11-14

## 2021-11-14 MED ORDER — OXYCODONE IMMEDIATE RELEASE 30 MG TABLET
30 mg | ORAL | Status: DC
Start: 2021-11-14 — End: 2021-11-14

## 2021-11-14 MED ORDER — KETOROLAC 30 MG/ML (1 ML) INJECTION SOLUTION
30 mg/mL (1 mL) | Freq: Four times a day (QID) | INTRAVENOUS | Status: DC | PRN
Start: 2021-11-14 — End: 2021-11-16

## 2021-11-14 MED ORDER — VANCOMYCIN IVPB (1.25 G IN 250ML NS)
Freq: Three times a day (TID) | INTRAVENOUS | Status: DC
Start: 2021-11-14 — End: 2021-11-15
  Administered 2021-11-15: 04:00:00 250.000 mL/h via INTRAVENOUS

## 2021-11-14 MED ORDER — ENOXAPARIN 40 MG/0.4 ML SUBCUTANEOUS SYRINGE
40 mg/0.4 mL | SUBCUTANEOUS | Status: DC
Start: 2021-11-14 — End: 2021-11-15

## 2021-11-14 MED ORDER — IBUPROFEN 600 MG TABLET
600 mg | Freq: Four times a day (QID) | ORAL | Status: DC | PRN
Start: 2021-11-14 — End: 2021-11-14

## 2021-11-14 MED ORDER — VANCOMYCIN MAR LEVEL
Freq: Once | INTRAVENOUS | Status: CP
Start: 2021-11-14 — End: ?

## 2021-11-14 MED ORDER — ACETAMINOPHEN 325 MG TABLET
325 mg | Freq: Four times a day (QID) | ORAL | Status: DC | PRN
Start: 2021-11-14 — End: 2021-11-19
  Administered 2021-11-14: 19:00:00 325 mg via ORAL

## 2021-11-14 MED ORDER — PIPERACILLIN-TAZOBACTAM (ZOSYN) 4.5GM MBP
Freq: Four times a day (QID) | INTRAVENOUS | Status: DC
Start: 2021-11-14 — End: 2021-11-16
  Administered 2021-11-14 – 2021-11-16 (×8): 100.000 mL/h via INTRAVENOUS

## 2021-11-14 MED ORDER — VANCOMYCIN MAR LEVEL
Freq: Once | INTRAVENOUS | Status: DC
Start: 2021-11-14 — End: 2021-11-15

## 2021-11-14 MED ORDER — ZZ IMS TEMPLATE
ORAL | Status: DC
Start: 2021-11-14 — End: 2021-11-19
  Administered 2021-11-15 – 2021-11-18 (×23): 30 mg via ORAL

## 2021-11-14 MED ORDER — LIDOCAINE 5 % ADHESIVE PATCH
5 % | TRANSDERMAL | Status: DC
Start: 2021-11-14 — End: 2021-11-19

## 2021-11-14 MED ORDER — OXYCODONE IMMEDIATE RELEASE 15 MG TABLET
15 mg | ORAL | Status: DC
Start: 2021-11-14 — End: 2021-11-14
  Administered 2021-11-14: 16:00:00 15 mg via ORAL

## 2021-11-14 MED ORDER — OXYCODONE IMMEDIATE RELEASE 30 MG TABLET
30 mg | ORAL | Status: DC
Start: 2021-11-14 — End: 2021-11-19
  Administered 2021-11-16 – 2021-11-17 (×3): 30 mg via ORAL

## 2021-11-14 NOTE — Other
VANCOMYCIN LEVEL EVALUATIONCurrent Vancomycin Order: 1 g IV every 8 hours. (Last dose 5/14 @ 8:00)Day of Therapy: 3Vancomycin Indication: Suspected MRSA infection- L thigh abscess/infectionEstimated Creatinine Clearance: 152 mL/min (by C-G formula based on SCr of 0.72 mg/dL).Creatinine (mg/dL) Date Value 40/04/2724 0.72 11/13/2021 0.71 11/12/2021 0.93  Renal Function: StableLevel: Lab Results Last 72 Hours Component Value Date/Time  Vancomycin Trough 8.4 (L) 11/14/2021 02:51 PM  Vancomycin Trough 5.6 (L) 11/13/2021 07:51 PM Type of Level: Trough drawn ~1 hours earlyBased on vancomycin level obtained, recommend:  Increase to 1250 mg every 8 hoursRepeat Level: VT 5/16 @7 :30ID/AST Consulted? No [facility] Vancomycin Dosing GuidelineBH Vancomycin Dosing GuidelineFor questions, please contact the pharmacist: Helane Rima, PharmD    Phone/Mobile Heartbeat: [facility]

## 2021-11-14 NOTE — Plan of Care
Plan of Care Overview/ Patient Status    Problem: Adult Inpatient Plan of CareGoal: Plan of Care ReviewOutcome: Interventions implemented as appropriateGoal: Patient-Specific Goal (Individualized)Outcome: Interventions implemented as appropriateGoal: Absence of Hospital-Acquired Illness or InjuryOutcome: Interventions implemented as appropriateGoal: Optimal Comfort and WellbeingOutcome: Interventions implemented as appropriateGoal: Readiness for Transition of CareOutcome: Interventions implemented as appropriate Problem: Impaired Wound HealingGoal: Optimal Wound HealingOutcome: Interventions implemented as appropriate Problem: Fall Injury RiskGoal: Absence of Fall and Fall-Related InjuryOutcome: Interventions implemented as appropriate Pt admitted on 5/12 with infected L Femur hardware.  Pt alert and oriented.  VSS.  Pt had fever overnight; tylenol given.  He remains on r/a.  Pt on PCA pump for pain management.  He continues on IV abx.  Hourly rounding performed and safety maintained

## 2021-11-14 NOTE — Other
Admission Note Nursing Phillip Bell is a 26 y.o. male admitted with a chief complaint of Sepsis, sickle cell crisis. Patient arrived from  EDPatient is    AOX4 Vitals:  11/13/21 1653 11/13/21 1737 11/13/21 1739 11/13/21 2015 BP: 115/67 122/64  122/68 Pulse: (!) 96 (!) 107  (!) 103 Resp: 18 18  20  Temp: 98.7 ?F (37.1 ?C) (!) 100.3 ?F (37.9 ?C)  (!) 100.6 ?F (38.1 ?C) TempSrc: Oral Oral  Oral SpO2: 100% 100%  99% Weight:   69 kg  Height:     Oxygen therapy Oxygen TherapySpO2: 99 %Device (Oxygen Therapy): room airI have reviewed the patient's current medication orders..I have reviewed patient valuables Belongings charted in last 7 days: Valuable(s) : Cell Phone (11/13/2021  6:00 PM) Comments:Patient is AOX4. Patient has elevated Billirubin, elevated WBC, and is on PCA pump. Patient has history of left femur necrosis and fracture. Patient has incision on left thigh from procedures, scar is healed. Skin is CDI. Patient is 41 Yom. AOX4. Patient is on RA. Patient is continent x 2 last BM 11/12/21. Patient admission completed and started on IV Zosyn. Patient is independent but utilizes a cane or quad cane when his leg hurts. Patient has his glasses and cane on his possession. See flowsheets, patient education and plan of care for additional information.

## 2021-11-14 NOTE — Progress Notes
[facility]	 [facility]	Daily Progress NoteHospital Medicine ServiceAttending Provider: Laqueta Linden, MD [phone]:  6717/6717-AHospital Day #1 Subjective: Patient seen and examined on 5/14/2023CC: Sepsis, due to unspecified organism, unspecified whether acute organ dysfunction present (HC Code) (HC CODE) (HC Code)Interim History: Febrile again this afternoon to 102.3. Seen resting in bed, reports feeling a little better overall, feels pain and swelling in the LLE has improved. C/o back pain. Denies any other complaints.Objective: Current Meds:hydroxyurea, 1,500 mg, DailyLidocaine Patch, 2 patch, Q24HoxyCODONE, 15 mg, Q12HoxyCODONE, 15 mg, Q3H OroxyCODONE, 30 mg, Q3H OroxyCODONE, 45 mg, Q3Hpiperacillin-tazobactam, 4.5 g, Q6Hsodium chloride, 3 mL, Q8Hvancomycin, 1 g, Q8HVancomycin MAR level, , Once Vitals:Temp:  [98.5 ?F (36.9 ?C)-102.3 ?F (39.1 ?C)] 102.3 ?F (39.1 ?C)Pulse:  [94-107] 97Resp:  [18-22] 22BP: (103-122)/(61-72) 113/61SpO2:  [98 %-100 %] 98 %Device (Oxygen Therapy): room airLabs:I have reviewed the patient's pertinent labs as resulted in the EMR.CBC Last 24hrs: WBC/Hgb/Hct/Plts:  15.3/6.7/19.70/693 (05/14 0653)Chem Last 24hrs: BUN/Cr/GLU/ALT/AST/AMYLASE/LIPASE:  6/0.72/95/--/--/--/-- (05/14 0653)I/O's:Intake/Output Summary (Last 24 hours) at 11/14/2021 1340Last data filed at 11/13/2021 1420Gross per 24 hour Intake 254 ml Output -- Net 254 ml Physical ExamVitals and nursing note reviewed. Constitutional:     General: He is not in acute distress.   Appearance: Normal appearance. He is not toxic-appearing or diaphoretic.    Comments: Young man sitting up in bed, pleasant and conversant  HENT:    Mouth/Throat:    Mouth: Mucous membranes are moist. Eyes:    General: No scleral icterus.Cardiovascular:    Rate and Rhythm: Regular rhythm. Tachycardia present.    Pulses: Normal pulses.    Heart sounds: Normal heart sounds. Pulmonary:    Effort: Pulmonary effort is normal.    Breath sounds: Normal breath sounds. Abdominal:    General: Bowel sounds are normal. There is no distension.    Palpations: Abdomen is soft.    Tenderness: There is no abdominal tenderness. There is no guarding. Musculoskeletal:    Right lower leg: No edema.    Left lower leg: Edema (thigh) present. Skin:   General: Skin is warm and dry.    Comments: + hot to touch + surgical scar to L lateral thigh; surrounding edema and tenderness to palpation  Neurological:    Mental Status: He is alert and oriented to person, place, and time. Psychiatric:       Mood and Affect: Mood normal. Access:  PIVFoley: NoImaging:No results found. I reviewed lab results, microbiology, imaging and test results in formulating this patient's plan of care. Assessment: Phillip Bell is a 26 y.o. 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 Orthopedics following. Also being treated for sickle cell pain crisis 2/2 infection.Plan: # Sepsis 2/2 left thigh infection/abscess - MRI showed rim-enhancing fluid collection at the posterior aspect of the hardware/left femur; Edema and patchy mild enhancement of the quadriceps and biceps femoris muscle, could be infectious or inflammatory etiology; Fascial edema, most pronounced along the posterolateral aspect; Posterolateral subcutaneous soft tissue edema and mild enhancement, could represent cellulitis.- Appreciate Orthopedics consult, await further input at this time. NPO @ MN and Lovenox held for potential procedure - Continue Vanc/Zosyn - still spiking high temps, Tmax 102.3 today, vitals otherwise stable -  Blood cultures NGTD- WBCs trending down, 15k <-- 17 <-- 21 on admission - CRP elevated to 226 - pain control as below - US duplex LLE pending r/o DVT # Sickle cell pain crisis - Likely triggered by infection - Continue current pain regimen:              - Dilaudid PCA pump Q10 min lockout, wean as tolerated	 - Continue home oxycontin 15 mg BID              - Start on home oral tier oxycodone 15/30/45 mg Q3H scheduled              - lidocaine patches - heat packs PRN              - PRN Tylenol, PRN toradol for now to not mask fevers- Scheduled bowel regimen for opioid-induced side effects- H&H 6.7/19.7 today. No acute need for transfusion at this time, continue to monitor closely- No clinical concern for acute chest syndrome at this time; CXR w/o acute findings - Incentive spirometry - Appreciate Hematology consult- Continue hydroxureaDiet RegularVTE PPx: Lovenox - HOLD for potential procedure Medication Reconciliation: Pending pharmacy review Discharge Readiness: not medically readyExpected discharge timeframe: Undetermined pending courseExpected discharge location: Home Full CodeMedical decision making for this patient was high.I spent 50 minutes today on this encounter before, during and after the visit, reviewing labs and records, evaluating and examining the patient, entering orders and documenting the visit.Signed:Jasalyn Frysinger Telford Nab, [facility] Hospitalist Service5/14/20231:40 PM

## 2021-11-14 NOTE — Plan of Care
Plan of Care Overview/ Patient Status    7AM-7PM: pt is A&Ox4. VS= BP 105/60  - Pulse 87  - Temp 99.1 ?F (37.3 ?C) (Oral)  - Resp 16  - Ht 6' (1.829 m)  - Wt 69 kg  - SpO2 100% on RA. Reports LT hip pain on Dilaudid PCA pump and ATC meds. B/L PIV; CDI. On a reg diet. Takes pills whole. Skin: LT thigh scar. Ind w/ cane. Refused bed alarm- education done. Hourly rounding. All safety measures in place. WCTM and follow POC. Problem: Adult Inpatient Plan of CareGoal: Plan of Care ReviewOutcome: Interventions implemented as appropriateGoal: Patient-Specific Goal (Individualized)Outcome: Interventions implemented as appropriateGoal: Absence of Hospital-Acquired Illness or InjuryOutcome: Interventions implemented as appropriateGoal: Optimal Comfort and WellbeingOutcome: Interventions implemented as appropriateGoal: Readiness for Transition of CareOutcome: Interventions implemented as appropriate Problem: Impaired Wound HealingGoal: Optimal Wound HealingOutcome: Interventions implemented as appropriate Problem: Fall Injury RiskGoal: Absence of Fall and Fall-Related InjuryOutcome: Interventions implemented as appropriate

## 2021-11-15 LAB — RESPIRATORY VIRUS PCR PANEL  (YH VERIGENE)(LAB ORDER ONLY)
BKR ADENOVIRUS: NEGATIVE
BKR HUMAN METAPNEUMOVIRUS (HMPV): NEGATIVE
BKR INFLUENZA A: NEGATIVE
BKR INFLUENZA B: NEGATIVE
BKR PARAINFLUENZA VIRUS 1: NEGATIVE
BKR PARAINFLUENZA VIRUS 2: NEGATIVE
BKR PARAINFLUENZA VIRUS 3: NEGATIVE
BKR PARAINFLUENZA VIRUS 4: NEGATIVE
BKR RESPIRATORY SYNCYTIAL VIRUS: NEGATIVE
BKR RHINOVIRUS: NEGATIVE

## 2021-11-15 LAB — COMPREHENSIVE METABOLIC PANEL
BKR A/G RATIO: 0.8 — ABNORMAL LOW (ref 1.0–2.2)
BKR ALANINE AMINOTRANSFERASE (ALT): 45 U/L (ref 9–59)
BKR ALBUMIN: 3 g/dL — ABNORMAL LOW (ref 3.6–4.9)
BKR ALKALINE PHOSPHATASE: 203 U/L — ABNORMAL HIGH (ref 9–122)
BKR ANION GAP: 11 (ref 7–17)
BKR ASPARTATE AMINOTRANSFERASE (AST): 43 U/L — ABNORMAL HIGH (ref 10–35)
BKR AST/ALT RATIO: 1
BKR BILIRUBIN TOTAL: 1.3 mg/dL — ABNORMAL HIGH (ref <=1.2)
BKR BLOOD UREA NITROGEN: 5 mg/dL — ABNORMAL LOW (ref 6–20)
BKR BUN / CREAT RATIO: 6.4 — ABNORMAL LOW (ref 8.0–23.0)
BKR CALCIUM: 8.7 mg/dL — ABNORMAL LOW (ref 8.8–10.2)
BKR CHLORIDE: 103 mmol/L (ref 98–107)
BKR CO2: 23 mmol/L (ref 20–30)
BKR CREATININE: 0.78 mg/dL (ref 0.40–1.30)
BKR EGFR, CREATININE (CKD-EPI 2021): >60 mL/min/{1.73_m2} (ref >=60)
BKR GLOBULIN: 3.7 g/dL — ABNORMAL HIGH (ref 2.3–3.5)
BKR GLUCOSE: 97 mg/dL (ref 70–100)
BKR POTASSIUM: 3.6 mmol/L (ref 3.3–5.3)
BKR PROTEIN TOTAL: 6.7 g/dL (ref 6.6–8.7)
BKR SODIUM: 137 mmol/L (ref 136–144)

## 2021-11-15 LAB — CBC WITH AUTO DIFFERENTIAL
BKR WAM ABSOLUTE IMMATURE GRANULOCYTES.: 0.06 x 1000/ÂµL (ref 0.00–0.30)
BKR WAM ABSOLUTE LYMPHOCYTE COUNT.: 2.48 x 1000/ÂµL (ref 0.60–3.70)
BKR WAM ABSOLUTE NRBC (2 DEC): 0.04 x 1000/ÂµL (ref 0.00–1.00)
BKR WAM ANALYZER ANC: 8.27 x 1000/ÂµL — ABNORMAL HIGH (ref 2.00–7.60)
BKR WAM BASOPHIL ABSOLUTE COUNT.: 0.04 x 1000/ÂµL (ref 0.00–1.00)
BKR WAM BASOPHILS: 0.3 % (ref 0.0–1.4)
BKR WAM EOSINOPHIL ABSOLUTE COUNT.: 0.32 x 1000/ÂµL (ref 0.00–1.00)
BKR WAM EOSINOPHILS: 2.5 % (ref 0.0–5.0)
BKR WAM HEMATOCRIT (2 DEC): 16.5 % — ABNORMAL LOW (ref 38.50–50.00)
BKR WAM HEMOGLOBIN: 5.9 g/dL — CL (ref 13.2–17.1)
BKR WAM IMMATURE GRANULOCYTES: 0.5 % (ref 0.0–1.0)
BKR WAM LYMPHOCYTES: 19.2 % (ref 17.0–50.0)
BKR WAM MCH (PG): 36.6 pg — ABNORMAL HIGH (ref 27.0–33.0)
BKR WAM MCHC: 35.8 g/dL (ref 31.0–36.0)
BKR WAM MCV: 102.5 fL — ABNORMAL HIGH (ref 80.0–100.0)
BKR WAM MONOCYTE ABSOLUTE COUNT.: 1.75 x 1000/ÂµL — ABNORMAL HIGH (ref 0.00–1.00)
BKR WAM MONOCYTES: 13.5 % — ABNORMAL HIGH (ref 4.0–12.0)
BKR WAM MPV: 9.4 fL (ref 8.0–12.0)
BKR WAM NEUTROPHILS: 64 % (ref 39.0–72.0)
BKR WAM NUCLEATED RED BLOOD CELLS: 0.3 % (ref 0.0–1.0)
BKR WAM PLATELETS: 716 x1000/ÂµL — ABNORMAL HIGH (ref 150–420)
BKR WAM RDW-CV: 15.9 % — ABNORMAL HIGH (ref 11.0–15.0)
BKR WAM RED BLOOD CELL COUNT.: 1.61 M/ÂµL — ABNORMAL LOW (ref 4.00–6.00)
BKR WAM WHITE BLOOD CELL COUNT: 12.9 x1000/ÂµL — ABNORMAL HIGH (ref 4.0–11.0)

## 2021-11-15 LAB — C-REACTIVE PROTEIN     (CRP): BKR C-REACTIVE PROTEIN, HIGH SENSITIVITY: 208 mg/L — ABNORMAL HIGH

## 2021-11-15 LAB — ZZZMRSA BY PCR- VANCO RX (PHARMACY USE ONLY) (YH): BKR MRSA COLONIZATION STATUS PCR: NOT DETECTED

## 2021-11-15 LAB — PROCALCITONIN     (BH GH LMW Q YH): BKR PROCALCITONIN: 0.28 ng/mL — ABNORMAL HIGH

## 2021-11-15 MED ORDER — ENOXAPARIN 40 MG/0.4 ML SUBCUTANEOUS SYRINGE
40 mg/0.4 mL | SUBCUTANEOUS | Status: DC
Start: 2021-11-15 — End: 2021-11-19
  Administered 2021-11-15: 16:00:00 40 mg/0.4 mL via SUBCUTANEOUS

## 2021-11-15 NOTE — Progress Notes
ORTHOPAEDICS & REHABILITATIONOrthopaedic Trauma Progress NoteDate of Surgery: 3/12/23Surgery: Surgical stabilization left femur Interim History: Pain is better controlled, no new active complaints Vitals/Labs:Temp:  [97.8 ?F (36.6 ?C)-102.3 ?F (39.1 ?C)] 99.9 ?F (37.7 ?C)Pulse:  [68-104] 68Resp:  [16-22] 16BP: (101-122)/(50-65) 113/63SpO2:  [96 %-100 %] 100 %SpO2:  [96 %-100 %] 100 % (05/15 0826) Lab Results Component Value Date  WBC 12.9 (H) 11/15/2021  HGB 5.9 (LL) 11/15/2021  HCT 16.50 (L) 11/15/2021 Lab Results Component Value Date  CREATININE 0.78 11/15/2021 Exam:NADNon-labored breathingRegular rateLLE:Well healed surgical incisions Mild tenderness around the surgical incisions+EHL/FHL/GCS/TASILT grossly sp/dp/t nerve distributions2+ DP/PT pulseToes wwp,  CR<2sA/P: Phillip Bell is a 26 y.o. male who is s/p  Surgical stabilziation March 23 with Dr Artist Pais , presented with left thigh pain and lower back pain. ? Sickle cell crisis Orthopedic consulted to rule out surgical site infection/ osteomylitis. MRI showing some fluid collection. Can consider MSK aspiration- No active Orthopedic intervention at this time-if remaining in house can consider msk aspiration of fluid- Pain management - Weight bearing as tolerated LLE - We will follow Orthopedics Trauma Team:Weekdays 7:00 am - 4:30 pm: PA (first line):				Orthopaedic floor pager [phone] [place] (PGY2)		Contact [facility] Rosana Hoes  (PGY3)			Contact MHBWill Merlinda Frederick (Chief Resident)	Contact [facility] QMVHQION 4:30 pm - 7:00 am: Please page Orthopaedic Night Floor Resident [phone]: please check AMION/Qgenda or call operator for Orthopaedic Day/ Night Floor Resident on call

## 2021-11-15 NOTE — Consults
[place] Interventional RadiologyConsultation Information Interventional Radiology consultation requested by: Baldemar Friday, Cathleen Fears for consultation: aspiration/ possible drainage of left thigh fluid collection. Source of information: Patient, medical record, and consulting providerHistory of Present Illness Phillip Bell is a 26 y.o. male with h/o SCD, Avascular necrosis of left femoral head, with stabilization (metal plate) surgical procedure in 09/2021, chronic pain managed with oxycontin and SA oxycodone for breakthrough. [name] presented to the ED with left thigh and low back pain, fever, chills, and sweats.MRI reveals fluid collection in left thigh area concerning for possible infection.WBC: up to 21k-currently 12.9k on IV Zosyn.Blood cultures have been negative. Interventional radiology was requested to consideration aspiration, possible drainage of left thigh fluid collection.Past Medical History Past Medical History: Diagnosis Date ? Aplastic crisis (HC Code) (HC CODE) (HC Code) 6/6/ 2005 transfusion ? Avascular necrosis of femur head, left (HC Code) (HC CODE) (HC Code)  ? Chronic pain   sickle cell ? Conductive hearing loss 09/22/14 P.E tubes placed ? Dactylitis one episode  April, 1998 ? GERD (gastroesophageal reflux disease)  ? 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 On hydroxyurea therapy started October 2013 ? Pneumococcal vaccination given 01/20/2006 and 09/28/2012 ? 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 Vasoocclusive sickle cell crisis (HC Code) (HC CODE) (HC Code) Adm 01/02/2012, ER 04/08/12, 4/1-10/03/12, 7/2-01/05/13, 12/02/13-12/03/13 , March 2016 - 2 admissions  Past Surgical History Past Surgical History: Procedure Laterality Date ? CHOLECYSTECTOMY, LAPAROSCOPIC  1/15 ? TYMPANOSTOMY TUBE PLACEMENT  09/22/2014  by Dr. Bernita Raisin  Family History Family History Problem Relation Age of Onset ? Sickle cell trait Mother  ? Thyroid disease Mother  ? Sickle cell trait Father  ? Sarcoidosis Father  ? Diabetes Paternal Grandfather  ? Hypertension Paternal Grandfather  ? Alcohol abuse Maternal Aunt   Social History Social History Tobacco Use ? Smoking status: Never ? Smokeless tobacco: Never Substance Use Topics ? Alcohol use: Yes   Comment: beer and liquor on occasion  He reports that he does not currently use drugs after having used the following drugs: Marijuana.  Inpatient Medications Outpatient Medications Current Facility-Administered Medications Medication Dose Route Frequency Provider Last Rate Last Admin ? enoxaparin (LOVENOX) syringe 40 mg  40 mg Subcutaneous Daily Earlyne Iba, APRN     ? hydroxyurea (HYDREA) capsule 1,500 mg  1,500 mg Oral Daily Jone Baseman, MD   1,500 mg at 11/15/21 1610 ? lidocaine (LIDODERM) 5 % 2 patch  2 patch Transdermal Q24H Earlyne Iba, APRN     ? oxyCODONE (OxyCONTIN) 12 hr tablet 15 mg  15 mg Oral Q12H Jone Baseman, MD   15 mg at 11/15/21 0020 ? oxyCODONE (ROXICODONE) Immediate Release tablet 15 mg  15 mg Oral Q3H Earlyne Iba, APRN   15 mg at 11/15/21 0020  Or ? oxyCODONE (ROXICODONE) Immediate Release tablet 30 mg  30 mg Oral Q3H Earlyne Iba, APRN      Or ? oxyCODONE (ROXICODONE) Immediate Release tablet 45 mg  45 mg Oral Q3H Earlyne Iba, APRN   45 mg at 11/15/21 9604 ? piperacillin-tazobactam (ZOSYN) 4.5 g in sodium chloride 0.9% 100 mL (mini-bag plus)  4.5 g Intravenous Q6H Earlyne Iba, APRN 33.3 mL/hr at 11/15/21 0836 4.5 g at 11/15/21 0836 ? sodium chloride 0.9 % flush 3 mL  3 mL IV Push Q8H Jone Baseman, MD   3 mL at 11/14/21 1147 ?  HYDROmorphone   ? sodium chloride 10 mL/hr (11/13/21 1819) acetaminophen, ketorolac, naloxone, sodium chloride Medications Prior to Admission Medication Sig Dispense Refill Last Dose ? hydroxyurea (HYDREA) 500 mg capsule 3 tab daily for blood. 90 capsule 2  ? ibuprofen (ADVIL,MOTRIN) 600 mg tablet Up to 4 tablets daily as needed for pain. 100 tablet 1  ? oxyCODONE (OXYCONTIN) 15 mg 12 hr extended release tablet Take 1 tablet (15 mg total) by mouth every 12 (twelve) hours. 60 tablet 0  ? oxyCODONE (ROXICODONE) 15 mg Immediate Release tablet Take 1 tablet (15 mg total) by mouth every 4 (four) hours as needed for pain. 90 tablet 0  ? naloxone (NARCAN) 4 mg/actuation nasal spray Use 1 spray in 1 nostril for suspected opioid overdose. May repeat in 2 minutes in other nostril with new device if minimal or no response. 2 each 0   Allergies Allergies Allergen Reactions ? Nickel Rash  Objective Data Physical ExamVitals:  11/14/21 1956 11/15/21 0012 11/15/21 0527 11/15/21 0826 BP: (!) 122/50 122/65 101/60 113/63 Pulse: (!) 104 (!) 102 (!) 95 68 Resp: 16 16 16 16  Temp: 97.8 ?F (36.6 ?C) 98.7 ?F (37.1 ?C) 99.9 ?F (37.7 ?C) 99.9 ?F (37.7 ?C) TempSrc: Oral Oral Oral Oral SpO2: 96% 98% 99% 100% Weight:     Height:     Neuro: Awake and alert in no acute distressAirway: Class II: soft palate, fauces, portion of uvulaHeart: Warm, perfused HR: 80sLungs: No respiratory distress O2 Sat 98% on RAAbdomen: Non-distendedLaboratory ResultsChemistry:Recent Labs Lab 05/13/231820 05/14/230653 05/15/230542 NA 135* 138 137 K 3.7 3.7 3.6 CL 100 102 103 CO2 25 25 23  BUN 8 6 5* CREATININE 0.71 0.72 0.78 Complete Blood Count:Recent Labs Lab 05/13/231820 05/14/230653 05/15/230542 WBC 17.6* 15.3* 12.9* HGB 7.1* 6.7* 5.9* HCT 19.70* 19.70* 16.50* PLT 653* 693* 716* Liver Function Tests:Recent Labs Lab 05/12/230513 05/13/231820 05/15/230542 AST 34 47* 43* ALT 32 35 45 ALKPHOS 136* 165* 203* BILITOT 3.4* 2.1* 1.3* Coagulation Studies:No results for input(s): PTT, LABPROT, INR in the last 168 hours.Microbiology:Recent Labs Lab 05/12/230506 05/12/230525 LABBLOO No Growth to Date No Growth to Date Pertinent ImagingMRI Left Femur 11/13/2021: IMPRESSION: ?1.         Susceptibility signal from hardware limits evaluation of regional bone marrow. Consider triple phase bone scan for further evaluation if felt clinically warranted.2.         Rim-enhancing fluid collection at the posterior aspect of the hardware/left femur as described. Sterility is indeterminate and cannot be assessed by imaging. Such findings may represent postsurgical seroma or abscess in the appropriate clinical setting.3.         Edema and patchy mild enhancement of the quadriceps and biceps femoris muscle, nonspecific and could be related to infectious or inflammatory etiology.4.         Fascial edema, most pronounced along the posterolateral aspect, nonspecific. Posterolateral subcutaneous soft tissue edema and mild enhancement, could represent cellulitis.5.         Lobular structure along the left femoral neurovascular bundle, incompletely visualized limiting its evaluation and could represent reactive lymphadenopathy. Can consider dedicated Ostrander of the pelvis for further evaluation if felt clinically warranted.?Assessment and Plan AssessmentMitchel Bell is a 26 y.o. male with SCD, AVN of Left femoral head and recent surgical stabilization procedure presenting with pain, leukocytosis, and fever who is consulted for aspiration / possible drainage of Left thigh fluid collection.  After comprehensive evaluation of the patient and discussion with the requesting service, Drs. Marijean Bravo Arnand (IR resident) and Oletta Cohn (  IR attending) Interventional Radiology recommends aspiration possible drainage of left thigh fluid collection.Consult is for:  Drain New Placement This procedure is currently planned for: Tuesday, Nov 16, 2021.Indication: Fever, leukocytosis, pain, fluid collection Left thighReference image: see aboveLocation: LeftPreviously drained: NoAspiration with drain placement? YesUrgent: NoSeptic: No}Antibiotics: YesFluid studies needing to be sent: NoCoagulation Studies: No results for input(s): PTT, LABPROT, INR in the last 168 hours.Other notes:please send culturePLAN: Sedation: Moderate sedation using midazolam and fentanyl.Diet: NPO past midnight the day of the procedure.Consent: The procedure was discussed with the patient and informed written consent obtained.Interpretor Services: NoneCoagulation:   Blood thinners: Hold Lovenox on day of procedure until after the procedure is completed.    Labs: Please correct INR to <1.8 and platelets to >50,000.Contrast:   Allergy: No known allergy to iodinated contrast.   Renal function: Renal function is adequate for administration of iodinated contrast.Code Status:       Status: Full code.*Please note that procedure times as indicated in Epic are placeholders and do not necessarily reflect when the procedure will be performed. Thank you for involving Interventional Radiology in the care of your patient. Please text or call my Mobile Heart Beat with any questions or concerns.With urgent questions or concerns, please contact IR QI:[id number] (pagers)- YSC: [phone]- SRC: [phone]- BH: [phone] (phone, all locations)- [phone] (IRIR)Lissete Maestas Okey Regal, APRN 11/15/2021

## 2021-11-15 NOTE — Other
-  CONSULT  REQUEST  [facility] NOTE-Type of consult: [facility] Interventional Radiology -New Consult: [id number] [name] Kalis /Location: 6717/6717-A / Brief Clinical Question: left thigh infection/possible abscess at L femur hardware site in need of drainage/Callback Cell Phone: [phone] / Please confirm receipt of this message by texting back ?OK?-2 Glena Norfolk Page sent to Interventional Radiology [facility] [phone] at 10:34 AM.-Lejuan Botto Justice Deeds, PCT5/15/202310:33 [facility] [phone]

## 2021-11-15 NOTE — Plan of Care
Plan of Care Overview/ Patient Status    077-1500Pt  Ax4, RA, VSS, temp 99's. Pain medication given around the clock, Regular diet, fair po intake, continent to bb up to toile w/ cane. PT hemoglobin was 5.9 HCP ia aware and they will check the labs tomorrow morning for possible blood transfusion.  No acute event at this shift. Observed independently T/R, medications given, call bell within reach, safety maintained.

## 2021-11-15 NOTE — Other
-  CONSULT  REQUEST  [facility] NOTE-Type of consult: [facility] Infectious Diseases -New Consult: [id number] [name] Caesar /Location: 6717/6717-A / Brief Clinical Question: 26 y.o. M w/ sickle cell disease, here w/ Sepsis presumed 2/2 left thigh infection/abscess at prior surgical site, ortho does not think infected but no other source identified. please advise on work-up/management/Callback Cell Phone: [phone] / Please confirm receipt of this message by texting back ?OK?-1 - Mobile Heartbeat message sent to [facility], A at 9:26 AM. Received response at 09:26.-Mariamawit Depaoli Justice Deeds, PCT5/15/20239:25 [facility] [phone]

## 2021-11-15 NOTE — Consults
PHARMACY-ASSISTED MEDICATION REPORTPharmacist review of the best possible medication history obtained by the pharmacy medication history technician has been performed.  I have updated the home medication list and identified the following information that may be relevant to this admission.NOTES/RECOMMENDATIONSNone       Prior to Admission Medications Medication Name Sig Taking? Patient Reported   hydroxyurea (HYDREA) 500 mg capsuleLast dose:  --  3 tab daily for blood. Yes     ibuprofen (ADVIL,MOTRIN) 600 mg tabletLast dose:  --  Up to 4 tablets daily as needed for pain. Yes     naloxone (NARCAN) 4 mg/actuation nasal sprayLast dose:  --  Use 1 spray in 1 nostril for suspected opioid overdose. May repeat in 2 minutes in other nostril with new device if minimal or no response.       oxyCODONE (OXYCONTIN) 15 mg 12 hr extended release tabletLast dose:  -- Last Medication Note: >> Valetta Fuller   ZOX Nov 13, 2021 11:05 AMMedHX Tech(Jennifer Marlyce Huge, CPHT):  Patient reports using as needed  Entered by Valetta Fuller, CPHT Sat Nov 13, 2021 1105 Take 1 tablet (15 mg total) by mouth every 12 (twelve) hours. Yes     oxyCODONE (ROXICODONE) 15 mg Immediate Release tabletLast dose:  --  Take 1 tablet (15 mg total) by mouth every 4 (four) hours as needed for pain. Yes     Prior to admission medications last reviewed by Rickey Primus, PharmD on Mon Nov 15, 2021 0825 Thank Leonides Sake, PharmD5/15/20238:25 AMPhone: MHB

## 2021-11-15 NOTE — Plan of Care
Plan of Care Overview/ Patient Status    1900-0700 Alert and oriented x 4, VS are WNL, afebrile. RA. Continue to have pain on his left femur, Dilaudid IV thru PCA pump as ordered continue and Oxycodone oral given q 3 hours. IV antibiotics given. MRSA swab sent. NPO status initiated after midnight. Up to the bathroom independently.  Safety maintained. WCTM

## 2021-11-15 NOTE — Plan of Care
Plan of Care Overview/ Patient Status    1500-1900 hrs  Pt A&Ox4.  T max is 99.5 F, HR 100's, Other VSS.   Pt w/ L thigh pain, fair control w/ PCA Dilaudid and tier oxycodone 45 mg.  Also back pain.   .  Pt moves independently w/ cane.  Will monitor.

## 2021-11-15 NOTE — Progress Notes
[facility]	 [facility]	Daily Progress NoteHospital Medicine ServiceAttending Provider: Baldemar Friday, [place] [phone]:  [id number]-AHospital Day #2 Subjective: Patient seen and examined on 5/15/2023CC: Sepsis, due to unspecified organism, unspecified whether acute organ dysfunction present (HC Code) (HC CODE) (HC Code)Interim History: Afebrile since yesterday afternoon. Seen this AM on sickle cell rounds. Reports pain is improving, bothers him mostly at night, not ready to make changes to PCA pump yet. Planned for IR aspiration of L thigh fluid collection this week, timing TBD.Objective: Current Meds: enoxaparin prophylaxis dosing, 40 mg, Dailyhydroxyurea, 1,500 mg, DailyLidocaine Patch, 2 patch, Q24HoxyCODONE, 15 mg, Q12HoxyCODONE, 15 mg, Q3H OroxyCODONE, 30 mg, Q3H OroxyCODONE, 45 mg, Q3Hpiperacillin-tazobactam, 4.5 g, Q6Hsodium chloride, 3 mL, Q8H Vitals:Temp:  [97.8 ?F (36.6 ?C)-102.3 ?F (39.1 ?C)] 99.9 ?F (37.7 ?C)Pulse:  [68-104] 68Resp:  [16-22] 16BP: (101-122)/(50-65) 113/63SpO2:  [96 %-100 %] 100 %Device (Oxygen Therapy): room airLabs:I have reviewed the patient's pertinent labs as resulted in the EMR.CBC Last 24hrs: WBC/Hgb/Hct/Plts:  12.9/5.9/16.50/716 (05/15 0542)Chem Last 24hrs: BUN/Cr/GLU/ALT/AST/AMYLASE/LIPASE:  5/0.78/97/45/43/--/-- (05/15 0542)I/O's:Intake/Output Summary (Last 24 hours) at 11/15/2021 1227Last data filed at 11/15/2021 0600Gross per 24 hour Intake 54.5 ml Output -- Net 54.5 ml Physical ExamVitals and nursing note reviewed. Constitutional:     General: He is not in acute distress.   Appearance: Normal appearance. He is not toxic-appearing or diaphoretic. HENT:    Mouth/Throat:    Mouth: Mucous membranes are moist. Eyes:    General: No scleral icterus.Cardiovascular:    Rate and Rhythm: Normal rate and regular rhythm.    Pulses: Normal pulses.    Heart sounds: Normal heart sounds. Pulmonary:    Effort: Pulmonary effort is normal.    Breath sounds: Normal breath sounds. Abdominal:    General: Bowel sounds are normal. There is no distension.    Palpations: Abdomen is soft.    Tenderness: There is no abdominal tenderness. There is no guarding. Skin:   General: Skin is warm and dry.    Comments: + surgical scar to L lateral thigh, intact; tenderness to palpation  Neurological:    Mental Status: He is alert and oriented to person, place, and time. Psychiatric:       Mood and Affect: Mood normal. Access:  PIVFoley: NoImaging:No results found. I reviewed lab results, microbiology, imaging and test results in formulating this patient's plan of care. Assessment: Phillip Bell is a 26 y.o. 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 Orthopedics following, awaiting IR aspiration of fluid collection. Also being treated for sickle cell pain crisis 2/2 infection.Plan: # Sepsis 2/2 left thigh infection/abscess - MRI showed rim-enhancing fluid collection at the posterior aspect of the hardware/left femur; Edema and patchy mild enhancement of the quadriceps and biceps femoris muscle, could be infectious or inflammatory etiology; Fascial edema, most pronounced along the posterolateral aspect; Posterolateral subcutaneous soft tissue edema and mild enhancement, could represent cellulitis.- ID consulted - Appreciate Orthopedics eval; No active Ortho intervention at this time- Appreciate IR consult; planning for aspiration of fluid collection this week, timing TBD 	- will need NPO @ MN and Lovenox held day of procedure- Continue Zosyn. Dc Vanc as MRSA negative- Blood cultures NGTD- Afebrile today & WBCs trending down,  12.9 today <-- 15 yesterday <-- 21 on admission - CRP elevated to 208 <-- 226 - pain control as below - US duplex LLE pending r/o DVT # Sickle cell pain crisis - Likely triggered by infection - Continue current pain regimen:              - Dilaudid PCA pump Q10 min lockout, wean as tolerated	 - Continue home oxycontin 15 mg BID              - Continue home oral tier oxycodone 15/30/45 mg Q3H scheduled              - lidocaine patches - heat packs PRN              - PRN Tylenol, PRN toradol for now to not mask fevers- Scheduled bowel regimen for opioid-induced side effects- H&H 5.9/16.5 today <-- 6.7/19.7 yesterday. Patient asymptomatic; No acute need for transfusion at this time, continue to monitor closely- Transfuse for Hct goal >15 or symptoms per FYI- No clinical concern for acute chest syndrome; CXR w/o acute findings - Incentive spirometry - Appreciate Hematology consult- Continue hydroxureaDiet RegularVTE PPx: Lovenox Medication Reconciliation: Completed: by Pharmacist Discharge Readiness: not medically readyExpected discharge timeframe: Undetermined pending courseExpected discharge location: Home Full CodeMedical decision making for this patient was high.I spent 50 minutes today on this encounter before, during and after the visit, reviewing labs and records, evaluating and examining the patient, entering orders and documenting the visit.Signed:Chinmayi Rumer Telford Nab, [facility] Hospitalist Service5/15/2023 12:34 PM

## 2021-11-16 ENCOUNTER — Encounter
Admit: 2021-11-16 | Payer: PRIVATE HEALTH INSURANCE | Primary: Student in an Organized Health Care Education/Training Program

## 2021-11-16 LAB — COMPREHENSIVE METABOLIC PANEL
BKR A/G RATIO: 0.9 — ABNORMAL LOW (ref 1.0–2.2)
BKR ALANINE AMINOTRANSFERASE (ALT): 54 U/L (ref 9–59)
BKR ALBUMIN: 3.4 g/dL — ABNORMAL LOW (ref 3.6–4.9)
BKR ALKALINE PHOSPHATASE: 216 U/L — ABNORMAL HIGH (ref 9–122)
BKR ANION GAP: 10 (ref 7–17)
BKR ASPARTATE AMINOTRANSFERASE (AST): 54 U/L — ABNORMAL HIGH (ref 10–35)
BKR AST/ALT RATIO: 1
BKR BILIRUBIN TOTAL: 1.2 mg/dL (ref <=1.2)
BKR BLOOD UREA NITROGEN: 6 mg/dL (ref 6–20)
BKR BUN / CREAT RATIO: 7.8 — ABNORMAL LOW (ref 8.0–23.0)
BKR CALCIUM: 9.1 mg/dL (ref 8.8–10.2)
BKR CHLORIDE: 103 mmol/L (ref 98–107)
BKR CO2: 25 mmol/L (ref 20–30)
BKR CREATININE: 0.77 mg/dL (ref 0.40–1.30)
BKR EGFR, CREATININE (CKD-EPI 2021): >60 mL/min/{1.73_m2} (ref >=60)
BKR GLOBULIN: 3.6 g/dL — ABNORMAL HIGH (ref 2.3–3.5)
BKR GLUCOSE: 101 mg/dL — ABNORMAL HIGH (ref 70–100)
BKR POTASSIUM: 3.7 mmol/L (ref 3.3–5.3)
BKR PROTEIN TOTAL: 7 g/dL (ref 6.6–8.7)
BKR SODIUM: 138 mmol/L (ref 136–144)

## 2021-11-16 LAB — CBC WITH AUTO DIFFERENTIAL
BKR WAM ABSOLUTE IMMATURE GRANULOCYTES.: 0.08 x 1000/ÂµL (ref 0.00–0.30)
BKR WAM ABSOLUTE LYMPHOCYTE COUNT.: 3.03 x 1000/ÂµL (ref 0.60–3.70)
BKR WAM ABSOLUTE NRBC (2 DEC): 0.02 x 1000/ÂµL (ref 0.00–1.00)
BKR WAM ANALYZER ANC: 10.24 x 1000/ÂµL — ABNORMAL HIGH (ref 2.00–7.60)
BKR WAM BASOPHIL ABSOLUTE COUNT.: 0.05 x 1000/ÂµL (ref 0.00–1.00)
BKR WAM BASOPHILS: 0.3 % (ref 0.0–1.4)
BKR WAM EOSINOPHIL ABSOLUTE COUNT.: 0.32 x 1000/ÂµL (ref 0.00–1.00)
BKR WAM EOSINOPHILS: 2.1 % (ref 0.0–5.0)
BKR WAM HEMATOCRIT (2 DEC): 18 % — ABNORMAL LOW (ref 38.50–50.00)
BKR WAM HEMOGLOBIN: 6.3 g/dL — CL (ref 13.2–17.1)
BKR WAM IMMATURE GRANULOCYTES: 0.5 % (ref 0.0–1.0)
BKR WAM LYMPHOCYTES: 19.5 % (ref 17.0–50.0)
BKR WAM MCH (PG): 35.8 pg — ABNORMAL HIGH (ref 27.0–33.0)
BKR WAM MCHC: 35 g/dL (ref 31.0–36.0)
BKR WAM MCV: 102.3 fL — ABNORMAL HIGH (ref 80.0–100.0)
BKR WAM MONOCYTE ABSOLUTE COUNT.: 1.8 x 1000/ÂµL — ABNORMAL HIGH (ref 0.00–1.00)
BKR WAM MONOCYTES: 11.6 % (ref 4.0–12.0)
BKR WAM MPV: 9.3 fL (ref 8.0–12.0)
BKR WAM NEUTROPHILS: 66 % (ref 39.0–72.0)
BKR WAM NUCLEATED RED BLOOD CELLS: 0.1 % (ref 0.0–1.0)
BKR WAM PLATELETS: 813 x1000/ÂµL — ABNORMAL HIGH (ref 150–420)
BKR WAM RDW-CV: 17 % — ABNORMAL HIGH (ref 11.0–15.0)
BKR WAM RED BLOOD CELL COUNT.: 1.76 M/ÂµL — ABNORMAL LOW (ref 4.00–6.00)
BKR WAM WHITE BLOOD CELL COUNT: 15.5 x1000/ÂµL — ABNORMAL HIGH (ref 4.0–11.0)

## 2021-11-16 LAB — PT/INR AND PTT (BH GH L LMW YH)
BKR INR: 1.05 (ref 0.92–1.19)
BKR PARTIAL THROMBOPLASTIN TIME: 33.4 s — ABNORMAL HIGH (ref 23.0–31.4)
BKR PROTHROMBIN TIME: 10.9 s (ref 9.6–12.3)

## 2021-11-16 MED ORDER — KETOROLAC 30 MG/ML (1 ML) INJECTION SOLUTION
30 mg/mL (1 mL) | Freq: Four times a day (QID) | INTRAVENOUS | Status: DC | PRN
Start: 2021-11-16 — End: 2021-11-19

## 2021-11-16 MED ORDER — AMPICILLIN-SULBACTAM (UNASYN) 3GM MBP
Freq: Four times a day (QID) | INTRAVENOUS | Status: DC
Start: 2021-11-16 — End: 2021-11-16

## 2021-11-16 MED ORDER — AMPICILLIN-SULBACTAM (UNASYN) 1.5GM MBP
Freq: Four times a day (QID) | INTRAVENOUS | Status: DC
Start: 2021-11-16 — End: 2021-11-19
  Administered 2021-11-16 – 2021-11-18 (×8): 50.000 mL/h via INTRAVENOUS

## 2021-11-16 MED ORDER — FENTANYL (PF) 50 MCG/ML INJECTION SOLUTION
50 mcg/mL | Status: CP
Start: 2021-11-16 — End: ?

## 2021-11-16 MED ORDER — MIDAZOLAM 1 MG/ML INJECTION SOLUTION
1 mg/mL | Status: CP
Start: 2021-11-16 — End: ?

## 2021-11-16 MED ORDER — MIDAZOLAM (PF) 1 MG/ML INJECTION SOLUTION
1 mg/mL | Status: DC | PRN
Start: 2021-11-16 — End: 2021-11-16
  Administered 2021-11-16 (×2): 1 mg/mL via INTRAVENOUS

## 2021-11-16 MED ORDER — IOHEXOL 300 MG IODINE/ML INTRAVENOUS SOLUTION
300 mg iodine/mL | Status: DC | PRN
Start: 2021-11-16 — End: 2021-11-16
  Administered 2021-11-16: 22:00:00 300 mg iodine/mL

## 2021-11-16 MED ORDER — FENTANYL (PF) 50 MCG/ML INJECTION SOLUTION
50 mcg/mL | Status: DC | PRN
Start: 2021-11-16 — End: 2021-11-16
  Administered 2021-11-16 (×2): 50 mcg/mL

## 2021-11-16 NOTE — Procedures
[facility]-YscBrief Procedure Note:Patient name: Phillip RicePt ZOX:[id number] Date of procedure: 5/16/2023Procedure(s):  Left thigh abscess drainage catheter placementSedation method: Moderate Sedation. Patient reassessed immediately prior to the procedure before sedation administered.Pre Procedure Diagnosis:  AbscessPost Procedure Diagnosis:  SameIndications: [name] is a 26 year old man with a remote history of left femur stabilization, now presenting with signs of infection and a new left thigh fluid collection.Primary Attending: Surgeon(s) and Role:   Wilhemena Durie, MD - PrimaryAssistant:  Golden Circle, MDAnesthesia:  Moderate sedation, local anesthesiaSpecimen(s) removed:150 mL purulent fluid with internal debris  Additional studies ordered:  CultureDrains:  8.5 French Dawson Mueller pigtail drainage catheterEstimated Blood Loss: MinimalComplications: NoneVascular closure method:  NoneFindings/Notes/Comments:Placement of an 8.5 French pigtail drainage catheter into the left thigh fluid collection.  150 mL of purulent fluid with internal debris was aspirated and sent for culture.Plan:1. Follow cultures2. Please maintain the drain to gravity drainage and flush the drain with 10 mL sterile saline daily to maintain patency.  3. Please alert IR once output is less than 10 mL per day for 2 consecutive days or the patient is planned for discharge.Verbal Orders and Supervisory Attestation: As the procedural physician / APP, I verify I was present throughout the entire procedure and provided physician orders as documented in the specialty electronic documentation system. Furthermore I directly supervised all non physicians providers who assisted me duing the procedure and I accept delegation of supervisory responsibility for all post procedure patient care activity related to this patient that I request from a [facility] PA/APRN/staff member.Delton See, MD5/16/20235:52 PM

## 2021-11-16 NOTE — Other
HVC HANDOFF REPORTTo view medication administration, contrast administration,vital signs, and procedure details:Procedure documented in: Epic: see flowsheets, eMAR and Procedure log: (To access Procedure Log: go to chart review - imaging - double click on appropriate procedure - click on Procedure Log on right side of window)Procedural Nurse/Contact Number: Linus Orn [phone]     Performing Provider/Contact Number: Dr Wilhemena Durie & Dr Marguarite Arbour PowellProcedure performed: Abscess Drain Placement		Additional comments: Was procedure done with anesthesia Support? No Was procedure performed with moderate sedation: Yes: see procedural documentation for medications administeredPatient transferring on Oxygen: NoProcedural Access Site: Non-Vascular: L Posterior Thigh See Epic documentation for LDAsElectronically signed:Kingstyn Deruiter Harlan Stains, RN 11/16/2021, 5:49 PM

## 2021-11-16 NOTE — Plan of Care
Plan of Care Overview/ Patient Status    SOCIAL WORK ASSESSMENTPatient Name: Phillip RiceMedical Record Number: [id number] Date of Birth: 1997/04/28Medical Social Work Assessment Adult  Loss adjuster, chartered Most Recent Value Requested Accommodations (Leave blank if none requested or patient/family will supply their own hearing devices/glasses)  Requested by (staff name/role) michele barnaby Rendered Accommodations (Leave blank if none rendered or patient/family supplied their own hearing devices/glasses)  Other language interpreter used (non-ASL)? No Admission Information  Document Type Clinical Assessment - Able to Assess Reason for Current Social Work Involvement Support/Coping Source of Information Patient, Medical Team Medical Team Comment Inpatient medical team Record Reviewed Yes Level of Care Inpatient Relationships  Marital Status Single Adult Lives With Alone Family circumstances Parents are his primary support Informal Supports (family, friends, church, etc) Friends and family Formal Supports (current community resources and providers) Not reported at this time Pertinent spiritual or cultural factors Not reported at this time Relationship Comments Not reported at this time Abuse Screen (yes response referral indicated)  Able to respond to abuse questions Yes Do you Feel That You Are Treated Well By Your Partner/Spouse/Family Member/Caregiver/Employer?  yes What Happens When You Argue/Fight With Your Partner/Spouse/Family Member? Did not share any concerns Feels Unsafe at Home or Work/School no Feels Threatened by Someone no Does Anyone Try to Keep You From Having Contact with Others or Doing Things Outside Your Home? no Do you have concerns regarding someone you know having access to your MyChart account? no Language needed None, Patient Speaks English Literacy Read/write independently Special Needs N/A Social Determinants of Health Financial Concerns Deferred Vocational and employment status and history Full time employed at a [employer] What is your living situation today? I have a steady place to live Think about the place you live. Do you have problems with any of the following? None How hard is it for you to pay for the very basics like food, housing, medical care, and heating? Not very Within the past 12 months, you worried that your food would run out before you got the money to buy more. Never true Within the past 12 months, the food you bought just didn't last and you didn't have money to get more. Never true In the past 12 months, has lack of transportation kept you from medical appointments or from getting medications? no In the past 12 months, has lack of transportation kept you from meetings, work, or from getting things needed for daily living? No Housing/Transportation/Environmental Comment (use soup kitchens, food pantries, eviction pending, unable to afford) Unknown Mental Status  Mental Status Deferred Suicide Risk Assessment  Reason for Assessment Utilizing SAFE-T and C-SSRS (Check all that apply) Social Work Consult/Assessment C-SSRS Able to Assess Screening for suicidal ideation within Past Month In the past month have you wished you were dead or wished you could go to sleep and not wake up? no In the past month have you actually had any thoughts of killing yourself? no Have you ever done anything, started to do anything, or prepared to do anything to end your life? no Preparation Comment n/a Past 3 months Comment n/a [place] Suicide Risk Level low risk Specific Questions about Thoughts, Plans, Suicidal Intent (SAFE-T) Negative responses above do not indicate a need for SAFE-T assessment Risk Assessment  Risk Assessment Able to Assess Risk to Self Able to Assess Risk to Self - Self-Injurious Behavior None identified Attitudes regarding Self-Injury None disclosed Imminent Risk for Self-Injury in Community Low Imminent Risk for Self-Injury in Facility Low Risk to  Others Able to Assess Risk to Others None Disclosed Attitude regarding Aggression / Violence None Disclosed Imminent Risk for Violence in Community Low Imminent Risk for Violence in Facility Low Current and Past Psychiatric Diagnoses Able to Assess Mood Disorder No Anxiety Disorder No Psychotic Disorder No Substance Use Disorder No Post-Traumatic Stress Disorder (PTSD) No Attention Deficit/Hyperactivity Disorder (ADHD) No Traumatic Brain Injury (TBI) No Cluster B Personality Disorders or Traits (i.e. Borderline, Antisocial, Histrionic & Narcissistic) No Conduct Problems (Antisocial Behavior, Aggression, Impulsivity) No Other n/a Suicide Attempt No Prior Attempts Presenting Symptoms None Family History None reported Precipitants/Stressors Chronic physical pain/Other acute medical Change in Mental Health or Substance Use Disorder Treatment Not applicable General Risk Factors Recent stress General Protective Factors cooperative with interview, positive reality testing, able to express feelings, able to define needs This patient was screened using the [place] Suicide Severity Rating Scale (CSSRS)  Yes I conducted a suicide risk assessment including a suicide inquiry and assessment of risk and protective factors, as recommended by the standard Suicide Assessment Five-Step Evaluation and Triage (SAFE-T) for Mental Health Professionals. No, the C-SSRS did not produce a positive screen Cause for concern None Based on my assessment, the level of risk for this patient to suicide in an inpatient or emergency setting is:  MINIMAL because the patient does not present with suicidal ideation, does not have a history of suicide attempts, and the balance of protective factors outweighs any current risk factors Based on my assessment, the level of risk for this patient to suicide in the community is:  MINIMAL Recommended Next Steps Remain in/Return to Community Remain in/Return to [employer] The benefit to the patient of remaining in the community is The benefit to the patient of remaining in the community is Maintaining place in society, Retaining some control of treatment Substance Use  Active substance use Deferred Medically Ready for Discharge  Is this patient medically ready for discharge? No Expected Discharge Date 11/18/21 Discharge Plan  Expected Discharge Date 11/18/21 Formulation: Recommendation(s) and Intervention(s) (including for discharge to occur)  Psychosocial issues requiring intervention Sickle cell pain crisis, psychosocial support. Psychosocial interventions 20 minutes were spent face to face with Mr. Lamm, I introduced myself, explained about my role, provided my contact information and I encouraged him to contact me as needed. Mr. Douville shared that he is feeling okay and he would like to get out of the hospital soon. I provided reflective listening and empathy, I valiadated his feelings and I encouraged him to share all his concerns and questions. I also let him know that we can give him a letter for work if needed and he expressed gratitude and agreed. Mr. Kepner reported that he has a stable job and housing, his parents live nearby and he will stay with them for a few days when he leaves the hospital. He did not share any psychosocial concerns at this time. I will discuss with the team and I will remain available as needed. Collaborations Inpatient medical team Specific referrals to enhance community supports (include existing and new resources) n/a Handoff Required? No Next Steps/Plan (including hand-off): Social work intervention completed at this time. Signature: Shaune Pollack, LCSW Contact Information: [phone]

## 2021-11-16 NOTE — Plan of Care
[facility]	Spiritual Care NotePurpose: Referral Source: Protocol Observation: People present/Information Obtained From: Patient Emotional Mood: Calm Subjective: Chaplain present and supportive at Sickle Cell rounds with the Sickle Cell team.  Medical team lead the rounds for the patient.   Patient seemed calm as he shared his hopes for going home tomorrow.  Shared that he would like to miss as little work as he can.  Seemed annoyed when the team shared that patient may have to say a few days.  No direct concerns for spiritual care at this point.  Spiritual care available and supportive for patient at anytime for any spiritual care needs.  Spiritual Assessment:Referral Source: Protocol Information Obtained From: Patient Mood: Calm  Religious Affiliation: Christian  Spiritual Resources: Life Story SharingHelpful Religious Practices: Life Story Sharing Issues RaisedRaised by Patient: No Issues Raised, Shared Story of Illness, Ignacia Bayley to Return Home   Spiritual Interventions:Spiritual Intervention Index**Date of Spiritual Visit: 05/16/23Visit Type: Initial VisitLanguage or special accommodation rendered?: NoHow Well Do You Speak English?: very wellIntervention Type: Spiritual VisitResponding Chaplain: Unit ChaplainSpiritual/Religious Support Provided: Companionship, Spiritual SupportFollow-Up Visit Needed: NoOutcome: OUTCOMES: Expressed Spiritual/Religious Resources or Distress: Expressed gratitude   OUTCOMES: Progressed Spiritually: Knowledgeable about services of the Dept. of Spiritual Care Plan: Spiritual Care remain available as needed.  If any of the following needs arise, please contact spiritual care: N: New diagnosisE: Emotional / spiritual distressE: Existential distressD: Decision making / goals of care meetingsS: Support for staff and patients' loved ones C: Compromised copingA: Anxiety / stress / grief / loneliness R: Religious / cultural / ritual needsE: End-of-life care / death / dying   Total Consult Time: 22 minutesSigned: Rev.Jerald Kief M.Div.	[facility], [place] [address]P:[phone]: [phone] 5/16/20233:05 PM

## 2021-11-16 NOTE — Progress Notes
ORTHOPAEDICS & REHABILITATIONOrthopaedic Trauma Progress NoteDate of Surgery: 3/12/23Surgery: Surgical stabilization left femur Interim History: Pain is better controlled, no new active complaints Vitals/Labs:Temp:  [97.8 ?F (36.6 ?C)-99.9 ?F (37.7 ?C)] 99.8 ?F (37.7 ?C)Pulse:  [68-106] 95Resp:  [16-20] 20BP: (103-118)/(60-68) 117/68SpO2:  [98 %-100 %] 100 %SpO2:  [98 %-100 %] 100 % (05/16 0556) Lab Results Component Value Date  WBC 15.5 (H) 11/16/2021  HGB 6.3 (LL) 11/16/2021  HCT 18.00 (L) 11/16/2021 Lab Results Component Value Date  CREATININE 0.77 11/16/2021 Exam:NADNon-labored breathingRegular rateLLE:Well healed surgical incisions Mild tenderness around the surgical incisions+EHL/FHL/GCS/TASILT grossly sp/dp/t nerve distributions2+ DP/PT pulseToes wwp,  CR<2sA/P: Phillip Bell is a 26 y.o. male who is s/p  Surgical stabilziation March 23 with Dr Artist Pais , presented with left thigh pain and lower back pain. ? Sickle cell crisis Orthopedic consulted to rule out surgical site infection/ osteomylitis. MRI showing some fluid collection. Can consider MSK aspiration- can consider msk aspiration of fluid and drain placement- Pain management - Weight bearing as tolerated LLE - We will follow Orthopedics Trauma Team:Weekdays 7:00 am - 4:30 pm: PA (first line):				Orthopaedic floor pager [phone] [place] (PGY2)		Contact M[facility] Rosana Hoes  (PGY3)			Contact MHBWill Merlinda Frederick (Chief Resident)	Contact [facility] [id number] 4:30 pm - 7:00 am: Please page Orthopaedic Night Floor Resident [phone]: please check AMION/Qgenda or call operator for Orthopaedic Day/ Night Floor Resident on call

## 2021-11-16 NOTE — Plan of Care
Plan of Care Overview/ Patient Status    1900-0700 Alert and oriented x 4, VS are WNL, afebrile. RA. Continue to have pain on his left femur, Dilaudid IV thru PCA pump as ordered continue and max Oxycodone oral given q 3 hours. IV antibiotics given.  NPO status initiated after midnight biopsy today.Up to the bathroom independently.  Safety maintained. WCTM

## 2021-11-16 NOTE — Plan of Care
Plan of Care Overview/ Patient Status    07:00-19:00 pt A & O x 4 very pleasant Pt c/o left thigh pain pt remained on CADD pump with IVF and oral tier dose pt has been taken 45 oxycodone q 3 hrsPt remained NPO for biopsy this afternoonPt ambulated independently in room steady gaitSafety precautions maintained at all times please refer to flowsheet POC EMAR for more informations will continue to monitorOn return from biopsy left leg drain to gravity  to be flush 10 ml daily  Dressing c/d/i

## 2021-11-16 NOTE — Progress Notes
[facility]	 [facility]	Medicine Progress NoteHospitalist ServiceAttending Provider: Baldemar Friday, [place] [phone]:  [id number]-AHospital Day #3 Subjective: CC: L thigh painInterim History: Still having some L thigh swelling and pain.  Initially asked to take away the PCA pump, then when offered to increase the lockout instead just wanted to keep the 10 min lockout. Was unsure if using the toradol.  Motivated to go home as soon as able since missing work.Seen with sickle cell teamReview of Allergies/Meds/Hx: I have reviewed the patient's: allergies, current scheduled medications, current infusions, current prn medications, past medical history and past surgical historyObjective: Vitals:Temp:  [97.8 ?F (36.6 ?C)-99.8 ?F (37.7 ?C)] 98.6 ?F (37 ?C)Pulse:  [85-106] 90Resp:  [16-20] 16BP: (103-124)/(60-76) 124/76SpO2:  [98 %-100 %] 99 %Device (Oxygen Therapy): room airI/O's:I have reviewed the patient's current I&O's as documented in the EMR.Gross Totals (Last 24 hours) at 11/16/2021 1118Last data filed at 11/16/2021 0538Intake 32 ml Output -- Net 32 ml Physical Exam General: Alert and oriented x3, NADHEENT:  MMs Moist, OP clear.Pulm: Resp unlabored. CTABCardiac:  RRR, S1S2, no m/r/gGI:  BS present.   Soft, N/T, N/D.Vascular:  No pitting LE edema.  Slight swelling to upper L LE.Labs: Latest Reference Range & Units 11/15/21 05:42 11/16/21 05:59 Total Bilirubin <=1.2 mg/dL 1.3 (H) 1.2 Alkaline Phosphatase 9 - 122 U/L 203 (H) 216 (H) Alanine Aminotransferase (ALT) 9 - 59 U/L 45 54 Aspartate Aminotransferase (AST) 10 - 35 U/L 43 (H) 54 (H) AST/ALT Ratio See Comment  1.0 1.0 (H): Data is abnormally high Latest Reference Range & Units 11/15/21 05:42 11/16/21 05:59 WBC 4.0 - 11.0 x1000/?L 12.9 (H) 15.5 (H) RBC 4.00 - 6.00 M/?L 1.61 (L) 1.76 (L) Hemoglobin 13.2 - 17.1 g/dL 5.9 (LL) 6.3 (LL) Hematocrit 38.50 - 50.00 % 16.50 (L) 18.00 (L) MCV 80.0 - 100.0 fL 102.5 (H) 102.3 (H) MCH 27.0 - 33.0 pg 36.6 (H) 35.8 (H) MCHC 31.0 - 36.0 g/dL 78.2 95.6 RDW-CV 21.3 - 15.0 % 15.9 (H) 17.0 (H) Platelets 150 - 420 x1000/?L 716 (H) 813 (H) (LL): Data is critically low(H): Data is abnormally high(L): Data is abnormally lowAssessment/Plan: 26 y.o. 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 Also being treated for sickle cell pain crisis 2/2 infection.Sepsis, L thigh infection/abscess:-s/p MRI-plan for IR aspiration today-MRSA neg and vanc dc-change zosyn to Unasyn-follow cultures from aspiration, ID asked to follow yesterday-LLE u/s to r/o DVT-seen by ortho - no active ortho intervention at this time; WBATSickle cell pain crisis:-continue Dilaudid PCA 10 min lockout - patient to notify us later if he wants to go up on this lockout-continue oxycontin 15mg  bid-continue oxycodone tier q3h-continue lido patches-heat packs prn, prn tylenol-hasn't been using toradol but encouraged to try - can renew order-transfusion goal hct >15#. FEN:  Npo for procedure#. PPx: lovenox - on hold for procedure#. Code Status: Full Code#. Med Reconciliation: completeSigned:Ariellah Faust Gae Dry, APRN 11:11 AMMedical decision making for this patient was moderate. 45 min minutes spent today on this encounter before, during and after the visit, reviewing labs and records, evaluating and examining the patient, entering orders and documenting the visit.

## 2021-11-17 ENCOUNTER — Inpatient Hospital Stay: Admit: 2021-11-17 | Payer: BLUE CROSS/BLUE SHIELD

## 2021-11-17 DIAGNOSIS — D5700 Hb-SS disease with crisis, unspecified: Secondary | ICD-10-CM

## 2021-11-17 LAB — MANUAL DIFFERENTIAL
BKR WAM ATYPICAL LYMPHOCYTES (DIFF) 1 DEC: 0.9 % (ref 0.0–1.0)
BKR WAM BASOPHIL - ABS (DIFF) 2 DEC: 0 x 1000/ÂµL (ref 0.00–1.00)
BKR WAM BASOPHILS (DIFF): 0 % (ref 0.0–1.4)
BKR WAM EOSINOPHILS (DIFF) 2 DEC: 0.29 x 1000/ÂµL (ref 0.00–1.00)
BKR WAM EOSINOPHILS (DIFF): 2.6 % (ref 0.0–5.0)
BKR WAM LYMPHOCYTE - ABS (DIFF) 2 DEC: 3.09 x 1000/ÂµL (ref 0.60–3.70)
BKR WAM LYMPHOCYTES (DIFF): 26.7 % (ref 17.0–50.0)
BKR WAM MONOCYTE - ABS (DIFF) 2 DEC: 1.15 x 1000/ÂµL — ABNORMAL HIGH (ref 0.00–1.00)
BKR WAM MONOCYTES (DIFF): 10.3 % (ref 4.0–12.0)
BKR WAM NEUTROPHILS (DIFF): 59.5 % (ref 39.0–72.0)
BKR WAM NEUTROPHILS - ABS (DIFF) 2 DEC: 6.66 x 1000/ÂµL (ref 2.00–7.60)

## 2021-11-17 LAB — CBC WITH AUTO DIFFERENTIAL
BKR WAM ABSOLUTE NRBC (2 DEC): 0.02 x 1000/ÂµL (ref 0.00–1.00)
BKR WAM HEMATOCRIT (2 DEC): 18.2 % — ABNORMAL LOW (ref 38.50–50.00)
BKR WAM HEMOGLOBIN: 6.3 g/dL — CL (ref 13.2–17.1)
BKR WAM MCH (PG): 34.6 pg — ABNORMAL HIGH (ref 27.0–33.0)
BKR WAM MCHC: 34.6 g/dL (ref 31.0–36.0)
BKR WAM MCV: 100 fL (ref 80.0–100.0)
BKR WAM MPV: 9.2 fL (ref 8.0–12.0)
BKR WAM NUCLEATED RED BLOOD CELLS: 0.2 % (ref 0.0–1.0)
BKR WAM PLATELETS: 883 x1000/ÂµL — ABNORMAL HIGH (ref 150–420)
BKR WAM RDW-CV: 17.6 % — ABNORMAL HIGH (ref 11.0–15.0)
BKR WAM RED BLOOD CELL COUNT.: 1.82 M/ÂµL — ABNORMAL LOW (ref 4.00–6.00)
BKR WAM WHITE BLOOD CELL COUNT: 11.2 x1000/ÂµL — ABNORMAL HIGH (ref 4.0–11.0)

## 2021-11-17 LAB — BLOOD CULTURE   (BH GH L LMW YH)
BKR BLOOD CULTURE: NO GROWTH
BKR BLOOD CULTURE: NO GROWTH

## 2021-11-17 MED ORDER — HYDROMORPHONE (DILAUDID) PCA 1 MG/ML (50 ML) YNH PYXIS
1 mg/ml | INTRAVENOUS | Status: DC
Start: 2021-11-17 — End: 2021-11-18

## 2021-11-17 NOTE — Plan of Care
Plan of Care Overview/ Patient Status    1900-0700 Alert and oriented x 4, VS are WNL, afebrile. RA. Continue to have pain on his left femur, Dilaudid IV thru PCA pump as ordered continue and max Oxycodone oral given q 3 hours. IV Ampicillin antibiotics given. Regular diet, good PO intake. Pigtail drainage catheter in place on left thigh intact. Up to the bathroom independently.  Safety maintained. WCTM

## 2021-11-17 NOTE — Progress Notes
ORTHOPAEDICS & REHABILITATIONOrthopaedic Trauma Progress NoteDate of Surgery: 3/12/23Surgery: Surgical stabilization left femur Interim History: Drain from IR yesterday. Patient states leg feels a little less swollen. Pain controlledVitals/Labs:Temp:  [98 ?F (36.7 ?C)-98.7 ?F (37.1 ?C)] 98 ?F (36.7 ?C)Pulse:  [79-105] 80Resp:  [13-20] 16BP: (97-139)/(58-72) 113/62SpO2:  [98 %-100 %] 100 %SpO2:  [98 %-100 %] 100 % (05/17 0800) Lab Results Component Value Date  WBC 11.2 (H) 11/17/2021  HGB 6.3 (LL) 11/17/2021  HCT 18.20 (L) 11/17/2021 Lab Results Component Value Date  CREATININE 0.77 11/16/2021 Exam:NADNon-labored breathingRegular rateLLE:Pigtail drainWell healed surgical incisions Mild tenderness around the surgical incisions+EHL/FHL/GCS/TASILT grossly sp/dp/t nerve distributions2+ DP/PT pulseToes wwp,  CR<2sDrain: 100A/P: Phillip Bell is a 26 y.o. male who is s/p  Surgical stabilziation March 23 with Dr Artist Pais , presented with left thigh pain and lower back pain. ? Sickle cell crisis Orthopedic consulted to rule out surgical site infection/ osteomylitis. MRI showing some fluid collection. - cultures NGTD will continue to follow-s/p drain placement from IR- Pain management - Weight bearing as tolerated LLE - We will follow Orthopedics Trauma Team:Weekdays 7:00 am - 4:30 pm: PA (first line):				Orthopaedic floor pager [phone] [place] (PGY2)		Contact MHB Rosana Hoes  (PGY3)			Contact MHBWill Merlinda Frederick (Chief Resident)	Contact MHB [id number] 4:30 pm - 7:00 am: Please page Orthopaedic Night Floor Resident [phone]: please check AMION/Qgenda or call operator for Orthopaedic Day/ Night Floor Resident on call

## 2021-11-17 NOTE — Progress Notes
[facility]	 [facility]	Daily Progress NoteHospital Medicine ServiceAttending Provider: Baldemar Friday, [place] [phone]:  6717/6717-AHospital Day #4Subjective   Patient seen and examined on 11/17/2021, 12:05 PM.CC: Sepsis, due to unspecified organism, unspecified whether acute organ dysfunction present (HC Code) (HC CODE) (HC Code)Interim History: Ambulating ad lib on unitNo complaintsEager for dcROS:ROS reviewed and otherwise negative.  Objective  Current Meds:ampicillin-sulbactam, 1.5 g, Q6H[Held by provider]  enoxaparin prophylaxis dosing, 40 mg, Dailyhydroxyurea, 1,500 mg, DailyLidocaine Patch, 2 patch, Q24HoxyCODONE, 15 mg, Q12HoxyCODONE, 15 mg, Q3H OroxyCODONE, 30 mg, Q3H OroxyCODONE, 45 mg, Q3Hsodium chloride, 3 mL, Q8H Vitals:Temp:  [98 ?F (36.7 ?C)-98.7 ?F (37.1 ?C)] 98 ?F (36.7 ?C)Pulse:  [79-105] 80Resp:  [13-20] 16BP: (97-139)/(58-72) 113/62SpO2:  [98 %-100 %] 100 %Device (Oxygen Therapy): room air  Labs:I have reviewed the patient's pertinent labs as resulted in the EMR.Lab Results Component Value Date  NA 138 11/16/2021  K 3.7 11/16/2021  CL 103 11/16/2021  CO2 25 11/16/2021  BUN 6 11/16/2021  CREATININE 0.77 11/16/2021  CALCIUM 9.1 11/16/2021 I/O's:Intake/Output Summary (Last 24 hours) at 11/17/2021 1205Last data filed at 11/17/2021 0551Gross per 24 hour Intake 39.5 ml Output 100 ml Net -60.5 ml  Physical Exam Awake, alertNC/AT MMMNo JVDLungs clearRRRAbd soft, nontenderL thigh drain with serous drainageNo edemaNo rashesImaging:IR Imaging Biopsies Aspirations DrainageResult Date: 5/16/2023PROCEDURE: Percutaneous abscess drainage DATE OF PROCEDURE: 11/16/2021 4:28 PM INDICATION: remote history of left femur stabilization, now presenting with signs of infection and a new left thigh fluid collection. PHYSICIANS: Wilhemena Durie, MD, the attending physician, was present for the procedure and its imaging.  Golden Circle, MD assisted. MEDICATIONS: Moderate sedation was achieved with fentanyl IV and Versed IV. Procedure time was 21 minutes, monitored all times by radiology nursing staff. TECHNIQUE AND FINDINGS: Informed written consent was obtained. The patient was then brought to the procedure suite, placed in the prone position, and a timeout was performed. The left leg was then prepped and draped in standard sterile fashion. Using ultrasound guidance, after local anesthesia had been obtained with 1% lidocaine, a 5 French 19-gauge Yueh catheter/needle was then inserted into the left thigh fluid collection with a ultrasound image stored for the record. A small amount of purulent fluid was aspirated from the needle and sent for culture. Subsequently, a 0.035 inch Amplatz wire was advanced through the catheter after the needle was removed under ultrasound guidance. Under fluoroscopic imaging, the Amplatz wire was noted to coil within the collection in the left leg.  The Yueh catheter was then removed, and then a an 8 French pigtail drainage catheter was advanced over the wire into the collection, and was coiled within it. The catheter tip was confirmed to be in appropriate position under fluoroscopic guidance with injection of approximately 5 cc of contrast. A total of 150 mL of purulent fluid with internal debris were aspirated.  The catheter was then anchored to the skin with 2-0 nylon suture and a sterile dressing was applied.  Abscess in L thigh treated by percutaneous catheter drainage PLAN: 1. Follow cultures. 2. Please maintain the drain to gravity drainage and flush the drain with 10 mL sterile saline daily to maintain patency.  3. Please alert IR once output is less than 10 mL per day for 2 consecutive days or the patient is planned for discharge.  Report Initiated By:  Golden Circle, MD Reported And Signed By: Wilhemena Durie, MD  [facility] Radiology and Biomedical Imaging  I reviewed lab results, microbiology, imaging, test results and EKG in formulating this  patient's plan of care.   Assessment: 26 y.o. male PMHx sickle cell disease with prior acute chest syndrome, AVN L femur s/p surgical stabilization/hardware implantation 09/2020 p/w fever/chills and L thigh pain found to have L thigh abscess.  C/c/b sickle cell pain crisisPlan: #. L thigh abscess- s/p IR drainage yesterday, appreciate assistance- continue unasyn total day 6 pending culture- appreciate ortho input, no intervention planned currently.  WBAT- lower extremity doppler to eval for DVT pending#. Sickle cell pain crisis- continue oral tier oxy 15/30/45- wean dilaudid PCA to 20 min lockout, pt amenable- continue naloxone gtt- continue prn tylenol/toradol- continue hydrea#. Diet: Diet Regular#. PPx: Lovenox#. Medication Reconciliation: Completed previously#. Dispo: Pending culture data/abx.  ?1-2 daysMedical decision making for this patient was moderate.Full Code Signed:Gene Colee Yancey Flemings, APRN5/17/202312:05 PM

## 2021-11-17 NOTE — Progress Notes
Assumed care of pt at 1500Pt alert/oriented Able to make needs knownPCA pump infusing. PO oxy given per MARVSSL thigh pigtail drain to self suctionBed alarm refused Call bell within reachFrequent rounding continued

## 2021-11-17 NOTE — Plan of Care
Plan of Care Overview/ Patient Status    07:00 - 15:00Vital signs stable. Complaints of generalized pain received Dilaudid PCA doses and scheduled Oxycodone Tier dose and tolerated well. Pigtail drain to self suction. Off unit for lower extremity ultrasound. IV Abx and scheduled medications given as ordered. See flow sheet for full assessment.

## 2021-11-18 ENCOUNTER — Telehealth
Admit: 2021-11-18 | Payer: PRIVATE HEALTH INSURANCE | Attending: Family | Primary: Student in an Organized Health Care Education/Training Program

## 2021-11-18 DIAGNOSIS — Z20822 Contact with and (suspected) exposure to covid-19: Secondary | ICD-10-CM

## 2021-11-18 DIAGNOSIS — A419 Sepsis, unspecified organism: Secondary | ICD-10-CM

## 2021-11-18 DIAGNOSIS — L02416 Cutaneous abscess of left lower limb: Secondary | ICD-10-CM

## 2021-11-18 DIAGNOSIS — L03116 Cellulitis of left lower limb: Secondary | ICD-10-CM

## 2021-11-18 DIAGNOSIS — D5700 Hb-SS disease with crisis, unspecified: Secondary | ICD-10-CM

## 2021-11-18 LAB — BASIC METABOLIC PANEL
BKR ANION GAP: 8 (ref 7–17)
BKR BLOOD UREA NITROGEN: 5 mg/dL — ABNORMAL LOW (ref 6–20)
BKR BUN / CREAT RATIO: 7.5 — ABNORMAL LOW (ref 8.0–23.0)
BKR CALCIUM: 9.3 mg/dL (ref 8.8–10.2)
BKR CHLORIDE: 105 mmol/L (ref 98–107)
BKR CO2: 27 mmol/L (ref 20–30)
BKR CREATININE: 0.67 mg/dL (ref 0.40–1.30)
BKR EGFR, CREATININE (CKD-EPI 2021): >60 mL/min/{1.73_m2} (ref >=60)
BKR GLUCOSE: 97 mg/dL (ref 70–100)
BKR POTASSIUM: 3.8 mmol/L (ref 3.3–5.3)
BKR SODIUM: 140 mmol/L (ref 136–144)

## 2021-11-18 LAB — CBC WITHOUT DIFFERENTIAL
BKR WAM ANALYZER ANC: 4.38 x 1000/ÂµL (ref 2.00–7.60)
BKR WAM HEMATOCRIT (2 DEC): 18.8 % — ABNORMAL LOW (ref 38.50–50.00)
BKR WAM HEMOGLOBIN: 6.3 g/dL — CL (ref 13.2–17.1)
BKR WAM MCH (PG): 34.2 pg — ABNORMAL HIGH (ref 27.0–33.0)
BKR WAM MCHC: 33.5 g/dL (ref 31.0–36.0)
BKR WAM MCV: 102.2 fL — ABNORMAL HIGH (ref 80.0–100.0)
BKR WAM MPV: 9.3 fL (ref 8.0–12.0)
BKR WAM PLATELETS: 968 x1000/ÂµL — ABNORMAL HIGH (ref 150–420)
BKR WAM RDW-CV: 18.1 % — ABNORMAL HIGH (ref 11.0–15.0)
BKR WAM RED BLOOD CELL COUNT.: 1.84 M/ÂµL — ABNORMAL LOW (ref 4.00–6.00)
BKR WAM WHITE BLOOD CELL COUNT: 8.6 x1000/ÂµL (ref 4.0–11.0)

## 2021-11-18 MED ORDER — AMOXICILLIN 875 MG-POTASSIUM CLAVULANATE 125 MG TABLET
875-125 mg | ORAL_TABLET | Freq: Two times a day (BID) | ORAL | 1 refills | Status: AC
Start: 2021-11-18 — End: ?

## 2021-11-18 NOTE — Telephone Encounter
Patient calling for refills on the following:ibuprofen (ADVIL,MOTRIN) 600 mg tabletoxyCODONE (OXYCONTIN) 15 mg 12 hr extended release tabletoxyCODONE (ROXICODONE) 15 mg Immediate Release tabletHe is being d/c from the hospital today and would like to pick these up with his other meds if possible. [id number] - [place], [place] - [address]

## 2021-11-18 NOTE — Progress Notes
ORTHOPAEDICS & REHABILITATIONOrthopaedic Trauma Progress NoteDate of Surgery: 3/12/23Surgery: Surgical stabilization left femur Interim History: Drain from IR (5/16) in place; no new output since yesterday. Output in bag (~100 cc) all serous. Patient states since the past three days he has felt overall markedly improved, feels ready to leave the hospital, states that this swelling is very similar to responses he has had in the past to viral illnesses in terms of his sickle cell crises and correlates with his experience this time. NGTD from IR drain. Vitals/Labs:Temp:  [97.9 ?F (36.6 ?C)-98.1 ?F (36.7 ?C)] 97.9 ?F (36.6 ?C)Pulse:  [68-82] 76Resp:  [16-20] 20BP: (108-118)/(61-71) 112/62SpO2:  [99 %-100 %] 100 %SpO2:  [99 %-100 %] 100 % (05/18 0443) Lab Results Component Value Date  WBC 11.2 (H) 11/17/2021  HGB 6.3 (LL) 11/17/2021  HCT 18.20 (L) 11/17/2021 Lab Results Component Value Date  CREATININE 0.77 11/16/2021 Exam:NADNon-labored breathing on room air Regular rate to pulse exam ZOX:WRUEAVW drainWell healed surgical incisions with no surrounding erythema, tenderness, or breakdown +EHL/FHL/GCS/TASILT grossly sp/dp/t nerve distributions2+ DP/PT pulseToes wwp,  CR<2sDrain: 100 since placement; no new output in past 24 A/P: Phillip Bell is a 26 y.o. male who is s/p surgical stabilziation L femur (index 12/2019, revision 09/2020) with Dr Artist Pais , presented with left thigh pain and lower back pain; working diagnosis now is sickle cell crisis in setting of viral illness - cultures NGTD (5/16; IR)- Recommend drain removal; no new output overnight - No acute ortho intervention planned - currently on Unasyn- Will arrange outpatient follow-up with Dr. Tobias Alexander, MD5/18/2023Orthopedics Trauma Team:Weekdays 7:00 am - 4:30 pm: PA (first line):			Orthopaedic floor pager [phone] Jnae Thomaston (PGY2)		Contact MHB Rosana Hoes  (PGY3)		Contact MHBWill Merlinda Frederick (Chief Resident)	Contact MHB [id number] 4:30 pm - 7:00 am: Please page Orthopaedic Night Floor Resident [phone]: please check AMION/Qgenda or call operator for Orthopaedic Day/ Night Floor Resident on call
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
## 2021-11-18 NOTE — Plan of Care
[place] [facility]	Spiritual Care NotePurpose: Referral Source: Protocol Observation: People present/Information Obtained From: Patient Emotional Mood: In Good Spirits Quality of Relational Support: Friends Present/Supportive Types of Relational Support: Friend   Subjective: Chaplain present and supportive at Sickle Cell rounds with the Sickle Cell team.  Medical team lead the rounds for the patient.   Patient seemed to be hopeful that he is going home today.  When discussion of a longer stay he got a little annoyed and wanted to make it clear he is going home today.  Spiritual care available and supportive for patient at anytime for any spiritual care needs.  Spiritual Assessment:Referral Source: Protocol Information Obtained From: Patient Mood: In Good SpiritsRelational SupportTypes of Relational Support: FriendQuality of Relational Support: Friends Present/Supportive Religious Affiliation: Curator  Spiritual Resources: Life Story SharingHelpful Religious Practices: Diplomatic Services operational officer RaisedRaised by Patient: No Issues Raised   Spiritual Interventions:Spiritual Intervention Index**Date of Spiritual Visit: 05/18/23Language or special accommodation rendered?: NoHow Well Do You Speak English?: very wellIntervention Type: Spiritual VisitResponding Chaplain: Unit ChaplainSpiritual/Religious Support Provided: Companionship, Spiritual SupportFollow-Up Visit Needed: NoOutcome: OUTCOMES: Expressed Spiritual/Religious Resources or Distress: Expressed gratitude Plan: Spiritual Care remain available as needed.  If any of the following needs arise, please contact spiritual care: N: New diagnosisE: Emotional / spiritual distressE: Existential distressD: Decision making / goals of care meetingsS: Support for staff and patients' loved ones C: Compromised copingA: Anxiety / stress / grief / loneliness R: Religious / cultural / ritual needsE: End-of-life care / death / dying   Total Consult Time: 15 minutesSigned: Rev.Jerald Kief M.Div.	[facility] [place], [place] [address]P:[phone]: [phone] 5/18/20232:13 PM

## 2021-11-18 NOTE — Plan of Care
Problem: Adult Inpatient Plan of CareGoal: Readiness for Transition of CareOutcome: Outcome(s) achieved Plan of Care Overview/ Patient Status    Case Management Plan  Flowsheet Row Most Recent Value Discharge Planning  Patient/Patient Representative goals/treatment preferences for discharge are:  home with no needs Patient/Patient Representative was presented with a list of facilities, agencies and/or dme providers and Referral(s) placed for: None Mode of Transportation  Private car  (add comment for special considerations) Patient accompanied by family CM D/C Readiness  PASRR completed and approved N/A Authorization number obtained, if required N/A Is there a 3 day INPATIENT Qualifying stay for Medicare Patients? N/A Medicare IM- signed, dated, timed and scanned, if required N/A DME Authorized/Delivered N/A No needs identified/ follow up with PCP/MD N/A Post acute care services secured W10 complete N/A Pri Completed and Accepted  N/A Is the destination address correct on the W10 N/A Finalized Plan  Expected Discharge Date 11/19/21 Discharge Disposition Home or Self Care  Patient scheduled to be discharged on 11/18/2021. Patient discharging home with no needs. Patient to be transported home by family. Patient in agreement with discharge plan. [facility] TartMSW Care [phone]

## 2021-11-18 NOTE — Plan of Care
Plan of Care Overview/ Patient Status    LOS NOTEPatient close to being medically ready for discharge. He will discharge home with no needs. CM will follow patient's progress with team, and will arrange post hospital care as needed. [place] [facility] TartMSW Care [phone]

## 2021-11-18 NOTE — Progress Notes
[place] Interventional RadiologyBrief Note Phillip Bell is a 26 year old 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 disease with left femoral head avascular necrosis status post surgical stabilization (12/2019 with revision on 09/2020). He developed a left thigh abscess and had a drain placed by IR on 5/16. Interventional Radiology was called due to minimal output from the drain over the last 24-36 hours. Patient seen at bedside, minimal serous output noted in bag. I did a brief bedside ultrasound which did not show any significant residual fluid collection around the drain. The patient is adamant about wanting the tube out and says that if we do not remove it he will remove it when he gets home. Given the minimal output and no residual collection on [place], I removed the drain. A sterile dressing was applied. He was advised to keep the dressing on for the next 2-3 days until the tract closes, and that some leakage onto the dressing is normal.Thank you for involving Interventional Radiology in the care of your patient. Please text or call my Mobile Heart Beat with any questions or concerns.With urgent questions or concerns, please contact IR UJ:[id number] (pagers)- YSC: [phone]- SRC: [phone]- BH: [phone] (phone, all locations)- [phone] Iline Oven, MD 11/18/2021

## 2021-11-18 NOTE — Other
-  CONSULT  REQUEST  DOCUMENTATION-[facility] NOTE-Type of consult: [facility] Infectious Diseases -New Consult: [id number] [name] Ratto /Location: 6717/6717-A / Brief Clinical Question: 26 y.o. M w/ sickle cell disease, here w/ Sepsis presumed 2/2 left thigh infection/abscess at prior surgical site, s/p aspiration and cx NGTD.  Wants to go home, ?abx regimen/Callback Cell Phone: [phone] / Please confirm receipt of this message by texting back ?OK?-1 - Mobile Heartbeat message sent to Mila Palmer at 10:29 AM. Received response at 1030.-Samel Bruna Dick5/18/202310:29 [facility] [phone]

## 2021-11-18 NOTE — Plan of Care
Plan of Care Overview/ Patient Status    1900--0700Patient is A&Ox4, VSS on RA. C/o pain to left femur, Dilaudid IV thru PCA pump as ordered continue and max Oxycodone oral given Q3 hours. No complaints of n/v or SOB. Regular diet, good PO intake. Pigtail drainage catheter in place on left thigh intact. Up to the bathroom independently. Encouraged to T&R. Safety maintained. WCTM

## 2021-11-18 NOTE — Telephone Encounter
Returned call to patient to advise him that we cannot refill his medications yet since he is still inpatient and hasn't been discharged yet. I advised him to check and see if the inpatient team will be discharging him with a script for his pain meds and if not, to call us in the AM. He verbalized his understanding and appreciated the call.

## 2021-11-18 NOTE — Plan of Care
Niki Loughney was discharged via [employer] accompanied by [employer].  Verbalized understanding of discharge instructionsand recommended follow up care as per the after visit summary.  Written discharge instructions provided. Denies any further questions. Vital signs    Vitals:  11/18/21 0443 11/18/21 0850 11/18/21 1158 11/18/21 1553 BP: 112/62 118/67 110/65 122/64 Pulse: 76 83 88 85 Resp: 20 16 16 16  Temp: 97.9 ?F (36.6 ?C) 98.2 ?F (36.8 ?C) 98.1 ?F (36.7 ?C) 98.5 ?F (36.9 ?C) TempSrc: Oral Oral  Oral SpO2: 100% 100% 100% 99% Weight:     Height:     Patient confirmed all belongings returned. Belongings charted in last 7 days: Valuable(s) : Cell Phone (11/13/2021  6:00 PM) Plan of Care Overview/ Patient Status    0700-dischargeA&Ox4, VSS, RA.  Weaned off PCA pain pump.  C/O pain 6/10; max tier dose dilaudid given q 3 hrs as scheduled.  Unasyn given q 6 hrs for Left thigh absess.  Drain clogged with puss; physician notified; IR removed drain.  [facility] [place]  Pt walked out with cane and mother.

## 2021-11-19 ENCOUNTER — Encounter
Admit: 2021-11-19 | Payer: PRIVATE HEALTH INSURANCE | Primary: Student in an Organized Health Care Education/Training Program

## 2021-11-19 ENCOUNTER — Encounter
Admit: 2021-11-19 | Payer: PRIVATE HEALTH INSURANCE | Attending: Family | Primary: Student in an Organized Health Care Education/Training Program

## 2021-11-19 ENCOUNTER — Encounter
Admit: 2021-11-19 | Payer: PRIVATE HEALTH INSURANCE | Attending: Critical Care Medicine | Primary: Student in an Organized Health Care Education/Training Program

## 2021-11-19 DIAGNOSIS — D571 Sickle-cell disease without crisis: Secondary | ICD-10-CM

## 2021-11-19 MED ORDER — OXYCODONE ER 15 MG TABLET,CRUSH RESISTANT,EXTENDED RELEASE 12 HR
15 mg | ORAL_TABLET | Freq: Two times a day (BID) | ORAL | 1 refills | Status: AC
Start: 2021-11-19 — End: 2021-12-09

## 2021-11-19 MED ORDER — OXYCODONE IMMEDIATE RELEASE 15 MG TABLET
15 mg | ORAL_TABLET | ORAL | 1 refills | Status: AC | PRN
Start: 2021-11-19 — End: 2021-12-09

## 2021-11-19 NOTE — Telephone Encounter
Patient returned call to office. He did not receive meds when discharged, only antibiotics. Would like his meds refilled.

## 2021-11-19 NOTE — Telephone Encounter
Reviewed, Epifanio Lesches

## 2021-11-20 NOTE — ED Provider Notes
Chief Complaint Patient presents with ? Sickle Cell Pain ? Fever-9 Weeks To 74 Years   Pt presents with c/o sickle cell crisis with increased pain to the lower back and left leg. Reports swelling to the left upper leg; hx of a metal plate in the leg a year ago. Using a cane to ambulate d/t the pain. HR 130s; denies CP. Fever 103; has been taking meds for the past few days.  Medical Decision MakingAmount and/or Complexity of Data ReviewedLabs: ordered.Radiology: ordered. Decision-making details documented in ED Course.ECG/medicine tests: ordered.RiskOTC drugs.Prescription drug management. 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 He endorses 10/10 pain of the left thigh exacerbated by movement and 7.5/10 pain of the lower back which is where he typically gets he acute pain attacks. He has not had issues with the left leg until recently. The leg has become progressive swollen and warm over the past week. He is febrile. He denies CP or SOB. His last dose of oxycodone immediate release was at 11pm. Exam reveals patient in NAD; he is febrile, and tachycardic; breath sounds clear; L lateral/posterior thigh is warm and red overlying old incision site that is well healed; there is full ROM of L knee and hip without pain. Differential includes osteomyelitis, cellulitis; no edema, or lower leg swelling/pain, therefore do not suspect DVT: he has intact DP pulses, do not suspect arterial occlusion. No evidence of acute chest syndrome; do not suspect septic joint given good ROM of knee and hip without pain. Concern for possible bacteremia secondary to cellulitis or osteomyelitis given tachycardia and fever. PlanPain Plan per Pain Service Provider Note, Dilaudid + ToradolXray L Femur, EKG, CXR, Blood Cx, ESR, CRPInfuenza, COVID, RVPCBC, BMP, Hepatic Function panel, Reticulocytes, Broad Spectrum Abx, Vanc & Zosyn2L Lactated RingersConsult Orthopedic SurgeryJohn Orvan Seen, MDPGY-3 Emergency Medicine Resident5/06/2022 Physical ExamED Triage Vitals [11/12/21 0354]BP: 120/71Pulse: (!) 131Pulse from  O2 sat: n/aResp: 19Temp: (!) 39.4 ?CTemp src: OralSpO2: 99 % BP 122/64  - Pulse 85  - Temp 98.5 ?F (36.9 ?C) (Oral)  - Resp 16  - Ht 6' (1.829 m)  - Wt 69 kg  - SpO2 99%  - BMI 20.63 kg/m? Physical ExamVitals and nursing note reviewed. Constitutional:     General: He is not in acute distress.HENT:    Head: Normocephalic and atraumatic.    Right Ear: External ear normal.    Left Ear: External ear normal. Eyes:    Conjunctiva/sclera: Conjunctivae normal. Cardiovascular:    Rate and Rhythm: Normal rate and regular rhythm.    Heart sounds: Normal heart sounds. Pulmonary:    Effort: Pulmonary effort is normal.    Breath sounds: Normal breath sounds. No stridor. No wheezing. Abdominal:    Palpations: Abdomen is soft.    Tenderness: There is no abdominal tenderness. There is no guarding or rebound. Musculoskeletal:       General: No deformity.    Comments: Left thigh is swollen from knee to buttock, erythematous and warm to touch. 2+ DP and PT pulses bilaterally.  Skin:   General: Skin is warm and dry.    Capillary Refill: Capillary refill takes less than 2 seconds. Neurological:    General: No focal deficit present.    Mental Status: He is alert. Mental status is at baseline. Psychiatric:       Behavior: Behavior normal.  Thought Content: Thought content normal.  Procedures Attestation/Critical CarePatient Reevaluation: Attending Supervised: ResidentI saw and examined the patient. I agree with the findings and plan of care as documented in the resident's note except as noted below. Additional acute and/or chronic problems addressed:  Patient is a 26 year old 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 disease here with fever and low back and left leg pain in area where a metal plate is in his leg.  On arrival to ED temp of 103.6?Marland Kitchen  Better blood pressure 97/45, pulse of 119.Concern for sickle cell crisis with fever..Differential diagnosis:  Fever, osteo, pneumonia, UTI Life threatening diagnoses considered on this emergency visit include:  Sepsis, sickle cell crisisPlan for:  Labs, antibiotics, x-rayTreatment included:  IV antibiotics, IV fluidsLabs ordered include: CBC ordered to assess for leukocytosis, anemia. Electrolytes ordered to assess for electrolyte abnormality.  EKG to screen for ACS, UA ordered to assess for UTI/hematuria.  Reticulocyte count.  LFTs to assess for liver function, lipase to assess for pancreatitis.Imaging ordered includes:  Chest x-ray, films of legI independently reviewed results of imaging chest x-ray reviewed independently by me and negative for acute pneumoniaI independently reviewed labs as ordered.I reviewed prior external clinical notes within the EHR.We discussed the patient's care with consult service - heme, ortho.Decision regarding hospitalization was discussed with patient. Given stability of their labs and vital signs and their preference to be discharged, I will plan to discharge the patient with close followup with their PCP. Careful return precautions discussed with patient including but not limited toParenteral controlled substances were considered for the patient's pain, but at the current time the level of pain is controlled or warrants other modalities.SDOH addressed in that patient unable to access medical care through PCP at this hour of the day. Hollace Kinnier, MD, MPHSharon A Chekijian=========================================Attending Supervised: ResidentI saw and examined the patient. I agree with the findings and plan of care as documented in the resident's note except as noted below. Additional acute and/or chronic problems addressed: Patient signed out to me by Dr. Frederich Balding.  Initially presented septic with pain of his R thigh and previous surgery and hardware in the right femur.  Pending MRI femur to rule out osteomyelitis.  Either admit to ortho for osteomyelitis or medicine for cellulitis and sepsis.-patient signed out to Dr. Azucena Kuba pending results of MRI.Mark K Rollins11pm I have received pass off on this 57M c SCD and leg hardware who presented with fever, undergoing MRI to r/o osteo, on broad spectrum ABX and will admit to medicine pending MRI results.Army Fossa, MDSigend out to me at 7AM, pt Tressie Ellis, MDComments as of 11/20/21 1430 Fri Nov 12, 2021 0800 Temp(!): 101.9 ?F (38.8 ?C)------------------------------------------I received sign-out for this patient from previous teamThe plan signed out to me included status post broad-spectrum antibiotics, IV fluid, antipyretics-remains febrile to 101.9.  Marked leukocytosis despite chronic or acute on chronic sickle cell anemia.  White blood cell count 21.6.  Awaiting Orthopedics evaluation stable for floor admissionHani Kaysee Hergert, MD, MPHED AttendingAvailable by MHB------------------------------------------ [HM] 0848 MRI Femur Left w wo IV ContrastPlan for MRI to evaluate for osteomyelitis [HM]  Comments User Index[HM] Starleen Blue, MD   Clinical Impressions as of 11/20/21 1430 Sepsis, due to unspecified organism, unspecified whether acute organ dysfunction present (HC Code) (HC CODE) (HC Code) Sickle cell crisis (HC Code) [facility] CODE) [facility] Code)  ED DispositionAdmit Hollace Kinnier, MD05/13/23 0128 Trey Sailors, DO05/13/23 2151 Haze Justin, MD05/14/23 0940 Army Fossa, MD05/20/23 [address], [place] O, MD05/20/23 1430

## 2021-11-21 LAB — DEEP WOUND CULTURE   (BH GH LMW YH)
BKR DEEP WOUND CULTURE: NO GROWTH
BKR GRAM STAIN (ROUTINE): NONE SEEN

## 2021-11-30 ENCOUNTER — Encounter
Admit: 2021-11-30 | Payer: BLUE CROSS/BLUE SHIELD | Attending: Orthopaedic Trauma | Primary: Student in an Organized Health Care Education/Training Program

## 2021-11-30 DIAGNOSIS — S72362A Displaced segmental fracture of shaft of left femur, initial encounter for closed fracture: Secondary | ICD-10-CM

## 2021-11-30 NOTE — Progress Notes
Review of Systems Constitutional: Negative.  HENT: Negative.  Eyes: Negative.  Respiratory: Negative.  Cardiovascular: Negative.  Gastrointestinal: Negative.  Genitourinary: Negative.  Musculoskeletal:      2 week follow up for left leg abcess. repprts no current pian. DOS:11/16/21  Skin: Negative.  Neurological: Negative.  Endo/Heme/Allergies: Negative.  Psychiatric/Behavioral: Negative.

## 2021-11-30 NOTE — Progress Notes
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  He feels like his left thigh is very similar to this.Patient currently has very little pain has no significant complaints.Exam:No apparent distressThe patient does not appear toxic he is interactive with the examination.ZOX:WRUEAVWU incisions well coapted without signs of infection.There is no obvious signs of erythema or peri-incisional drainage or fluctuance.His leg tolerate single leg stance, he is able to extend and flex his knee extend and flex his hip.There is no obvious crepitation palpation over his left femur.We did not obtain radiographs for him today.Plan:Observation for now no anticipated plans to remove hardware.

## 2021-12-09 ENCOUNTER — Encounter
Admit: 2021-12-09 | Payer: PRIVATE HEALTH INSURANCE | Attending: Family | Primary: Student in an Organized Health Care Education/Training Program

## 2021-12-09 ENCOUNTER — Telehealth
Admit: 2021-12-09 | Payer: PRIVATE HEALTH INSURANCE | Attending: Family | Primary: Student in an Organized Health Care Education/Training Program

## 2021-12-09 DIAGNOSIS — D571 Sickle-cell disease without crisis: Secondary | ICD-10-CM

## 2021-12-09 MED ORDER — OXYCODONE ER 15 MG TABLET,CRUSH RESISTANT,EXTENDED RELEASE 12 HR
15 mg | ORAL_TABLET | Freq: Two times a day (BID) | ORAL | 1 refills | Status: AC
Start: 2021-12-09 — End: 2021-12-27

## 2021-12-09 MED ORDER — IBUPROFEN 600 MG TABLET
600 mg | ORAL_TABLET | 2 refills | Status: AC
Start: 2021-12-09 — End: 2022-01-26

## 2021-12-09 MED ORDER — OXYCODONE IMMEDIATE RELEASE 15 MG TABLET
15 mg | ORAL_TABLET | ORAL | 1 refills | Status: AC | PRN
Start: 2021-12-09 — End: 2021-12-27

## 2021-12-09 NOTE — Telephone Encounter
Received call from: Contacts     Type Contact Phone/Fax  12/09/2021 12:13 PM EDT Phone (Incoming) Phillip, Bell (Self) [phone] (M)   Name of medication: oxyCODONE (ROXICODONE) 15 mg Immediate Release tablet & ibuprofen (ADVIL,MOTRIN) 600 mg tabletRefill should be routed to what pharmacy: [facility] [address] [place]	How many pills do you have left? 0 of ibuprofen, few of oxyHas patient contacted pharmacy to see if they have refills? YesPt stated that he is leaving town this Saturday, would like to have refills for trip

## 2021-12-09 NOTE — Progress Notes
[facility] Care Navigation:  Transition of Care Note Care Navigation spoke with: PatientDischarging Hospital: [facility] Admission Date: 5/12/2023Hospital Discharge Date: 11/18/2021    Diagnosis: sepsis from L thigh infectionSickle cell pain crisisDischarge location: HomeHOSPITALIZATION:Reason patient admitted: Fever 104.3chillsDiagnosis at discharge: sepsis from L thigh infectionSickle cell pain crisisCURRENT STATE:Since discharge patient reports feeling: BetterNew symptoms reported include: Kristina was hospitalized for fever 104.3 and was noted to have conerns for abscess of L thigh. Of note he had L femur fx in 09/2020 and had issues with non union. IR drain purulent fluid and debris and was given zosyn/Unasyn. Cultures of fluids yielded NGTD. He continues to be treated with 7 day course of Augmentin. Today he reports that he feels well, denies fever and pain is minimal to leg. He reports that he does have chronic pain from his sickle cell and is reaching out to his provider for refill of his oxycodone/oxycontin. Reviewed med changes per AVS and he verbalizes understanding of changes and dosing.Derald is agreeable to TOC appt. Of note he will need repeat imaging of groin lymph nodes to ensure that they have decreased to normal size once antibiotic course is complete.Patient cared for by: None  REVIEW OF AFTER VISIT SUMMARY DOCUMENT: MEDICATION CHANGES:Validated NEW medications to take: YesValidated Changed medications to take: N/AValidated Stopped medications to NOT take: N/AIssues obtaining prescriptions: No FOLLOW-UP APPOINTMENTS and TRANSPORTATION:Patient aware of scheduled appointments: YesAwareness and assistance with appointments needing to be scheduled: NoTransportation concerns for follow-up appointment: NoDME and HOME HEALTH SERVICES:Durable medical equipment received: N/AContact has been made with home care agency: N/APlan established for follow up labs/tests: N/A ADDITIONAL PATIENT NEEDS:Identified Social Determinants of Health opportunities: None identified

## 2021-12-14 ENCOUNTER — Ambulatory Visit
Admit: 2021-12-14 | Payer: BLUE CROSS/BLUE SHIELD | Attending: Student in an Organized Health Care Education/Training Program | Primary: Student in an Organized Health Care Education/Training Program

## 2021-12-14 ENCOUNTER — Encounter
Admit: 2021-12-14 | Payer: PRIVATE HEALTH INSURANCE | Attending: Student in an Organized Health Care Education/Training Program | Primary: Student in an Organized Health Care Education/Training Program

## 2021-12-14 MED ORDER — FLUTICASONE PROPIONATE 50 MCG/ACTUATION NASAL SPRAY,SUSPENSION
50 | Freq: Every evening | NASAL | 2 refills | 30.00000 days | Status: AC
Start: 2021-12-14 — End: 2022-01-05

## 2021-12-14 MED ORDER — AMOXICILLIN 875 MG-POTASSIUM CLAVULANATE 125 MG TABLET
875-125 | ORAL_TABLET | Freq: Two times a day (BID) | ORAL | 1 refills | 10.00000 days | Status: AC
Start: 2021-12-14 — End: ?

## 2021-12-27 ENCOUNTER — Encounter
Admit: 2021-12-27 | Payer: PRIVATE HEALTH INSURANCE | Primary: Student in an Organized Health Care Education/Training Program

## 2021-12-27 ENCOUNTER — Ambulatory Visit
Admit: 2021-12-27 | Payer: BLUE CROSS/BLUE SHIELD | Attending: Pediatric Hematology-Oncology | Primary: Student in an Organized Health Care Education/Training Program

## 2021-12-27 ENCOUNTER — Inpatient Hospital Stay
Admit: 2021-12-27 | Discharge: 2021-12-27 | Payer: BLUE CROSS/BLUE SHIELD | Primary: Student in an Organized Health Care Education/Training Program

## 2021-12-27 DIAGNOSIS — D571 Sickle-cell disease without crisis: Secondary | ICD-10-CM

## 2021-12-27 LAB — CBC WITH AUTO DIFFERENTIAL
BKR ALANINE AMINOTRANSFERASE (ALT): 0.9 % (ref 0.0–1.0)
BKR WAM ABSOLUTE IMMATURE GRANULOCYTES.: 0.04 x 1000/ÂµL (ref 0.00–0.30)
BKR WAM ABSOLUTE LYMPHOCYTE COUNT.: 2.87 x 1000/ÂµL (ref 0.60–3.70)
BKR WAM ABSOLUTE NRBC (2 DEC): 0.12 x 1000/ÂµL (ref 0.00–1.00)
BKR WAM ANALYZER ANC: 8.85 x 1000/ÂµL — ABNORMAL HIGH (ref 2.00–7.60)
BKR WAM BASOPHIL ABSOLUTE COUNT.: 0.06 x 1000/ÂµL (ref 0.00–1.00)
BKR WAM BASOPHILS: 0.5 % (ref 0.0–1.4)
BKR WAM EOSINOPHIL ABSOLUTE COUNT.: 0.3 x 1000/ÂµL (ref 0.00–1.00)
BKR WAM EOSINOPHILS: 2.3 % (ref 0.0–5.0)
BKR WAM HEMATOCRIT (2 DEC): 23.3 % — ABNORMAL LOW (ref 38.50–50.00)
BKR WAM HEMOGLOBIN: 8.1 g/dL — ABNORMAL LOW (ref 13.2–17.1)
BKR WAM IMMATURE GRANULOCYTES: 0.3 % — ABNORMAL HIGH (ref 4.0–5.6)
BKR WAM LYMPHOCYTES: 22.2 % (ref 17.0–50.0)
BKR WAM MCH (PG): 34.5 pg — ABNORMAL HIGH (ref 27.0–33.0)
BKR WAM MCHC: 34.8 g/dL (ref 31.0–36.0)
BKR WAM MCV: 99.1 fL (ref 80.0–100.0)
BKR WAM MONOCYTES: 6.3 % (ref 4.0–12.0)
BKR WAM MPV: 8.6 fL (ref 8.0–12.0)
BKR WAM NEUTROPHILS: 68.4 % (ref 39.0–72.0)
BKR WAM NUCLEATED RED BLOOD CELLS: 0.9 % (ref 0.0–1.0)
BKR WAM PLATELETS: 878 x1000/ÂµL — ABNORMAL HIGH (ref 150–420)
BKR WAM RED BLOOD CELL COUNT.: 2.35 M/??L — ABNORMAL LOW (ref 4.00–6.00)
BKR WAM WHITE BLOOD CELL COUNT: 12.9 x1000/??L — ABNORMAL HIGH (ref 4.0–11.0)

## 2021-12-27 LAB — COMPREHENSIVE METABOLIC PANEL
BKR A/G RATIO: 1.2 (ref 1.0–2.2)
BKR ALBUMIN: 4.2 g/dL (ref 3.6–4.9)
BKR ALKALINE PHOSPHATASE: 124 U/L — ABNORMAL HIGH (ref 9–122)
BKR ANION GAP: 11 (ref 7–17)
BKR ASPARTATE AMINOTRANSFERASE (AST): 100 U/L — ABNORMAL HIGH (ref 10–35)
BKR AST/ALT RATIO: 2.1
BKR BILIRUBIN TOTAL: 1.6 mg/dL — ABNORMAL HIGH (ref <=1.2)
BKR BLOOD UREA NITROGEN: 7 mg/dL — ABNORMAL HIGH (ref 6–20)
BKR BUN / CREAT RATIO: 10.6 (ref 8.0–23.0)
BKR CALCIUM: 9.2 mg/dL (ref 8.8–10.2)
BKR CHLORIDE: 105 mmol/L (ref 98–107)
BKR CO2: 23 mmol/L (ref 20–30)
BKR CREATININE: 0.66 mg/dL (ref 0.40–1.30)
BKR EGFR, CREATININE (CKD-EPI 2021): >60 mL/min/1.73m2 — ABNORMAL LOW (ref >=60–1.00)
BKR GLOBULIN: 3.6 g/dL — ABNORMAL HIGH (ref 2.3–3.5)
BKR GLUCOSE: 101 mg/dL — ABNORMAL HIGH (ref 70–100)
BKR POTASSIUM: 4.3 mmol/L (ref 3.3–5.3)
BKR PROTEIN TOTAL: 7.8 g/dL (ref 6.6–8.7)
BKR SODIUM: 139 mmol/L — ABNORMAL LOW (ref 136–144)
BKR WAM MONOCYTE ABSOLUTE COUNT.: 1.2 x 1000/??L (ref 1.0–2.2)
BKR WAM RDW-CV: 101 mg/dL — ABNORMAL HIGH (ref 70–100)

## 2021-12-27 LAB — RETICULOCYTES
BKR WAM IRF: 31 % — ABNORMAL HIGH (ref 3.0–15.9)
BKR WAM RETICULOCYTE - ABS (3 DEC): 0.152 10??6 cells/uL — ABNORMAL HIGH (ref 0.023–0.140)
BKR WAM RETICULOCYTE COUNT PCT (1 DEC): 6.5 % — ABNORMAL HIGH (ref 0.6–2.7)
BKR WAM RETICULOCYTE HGB EQUIVALENT: 34.6 pg (ref 28.2–35.7)

## 2021-12-27 LAB — LACTATE DEHYDROGENASE: BKR LACTATE DEHYDROGENASE: 452 U/L — ABNORMAL HIGH (ref 122–241)

## 2021-12-27 LAB — FERRITIN: BKR FERRITIN: 1208 ng/mL — ABNORMAL HIGH (ref 30–400)

## 2021-12-27 MED ORDER — OXYCODONE IMMEDIATE RELEASE 15 MG TABLET
15 mg | ORAL_TABLET | ORAL | 1 refills | Status: AC | PRN
Start: 2021-12-27 — End: 2022-01-07

## 2021-12-27 MED ORDER — HYDROXYUREA 500 MG CAPSULE
500 mg | ORAL_CAPSULE | Freq: Every day | ORAL | 3 refills | Status: AC
Start: 2021-12-27 — End: 2022-02-24

## 2021-12-27 MED ORDER — OXYCODONE ER 15 MG TABLET,CRUSH RESISTANT,EXTENDED RELEASE 12 HR
15 mg | ORAL_TABLET | Freq: Two times a day (BID) | ORAL | 1 refills | Status: AC
Start: 2021-12-27 — End: 2022-01-26

## 2021-12-27 NOTE — Progress Notes
[facility]ult Sick[facility] [phone]/27/2023[name] DOB [birth year] CCScheduled return for sickle cell disease.ProbPatient Active Problem List Diagnosis ? Eustachian tube disorder, bilateral ? Tonsillar and adenoid hypertrophy ? Sickle cell disease, type SS (HC Code) (HC CODE) (HC Code) ? 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 Hx acute chest syndrome (HC Code) ? Hx cholecystectomy ? Closed displaced segmental fracture of shaft of left femur, initial encounter (HC Code) ? Sickle cell disease without crisis (HC Code) (HC CODE) (HC Code) ? Decreased range of motion (ROM) of left knee ? Decreased strength of lower extremity ? Altered gait ? Sickle cell disease with crisis (HC Code) (HC CODE) (HC Code) ? Pneumonia ? Acute chest syndrome (HC Code) ? 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 Left leg injury ? Sickle cell pain crisis (HC Code) (HC CODE) (HC Code) ? Sepsis, due to unspecified organism, unspecified whether acute organ dysfunction present (HC Code) (HC CODE) (HC Code) ? Cellulitis of left thigh ? Acute tonsillitis due to other specified organisms PCPDr. Laural Benes HistorySickle cell disease (HbSS)Multiple hospitalizations for acute sickle cell painAvascular necrosis of left femoral headHx acute chest syndromeHx blood transfusionOther PMHxSurgical Hx1/15	Cholecystectomy3/16	PE tubes2017	Wisdom teeth removedFMHxMedCurrent Outpatient Medications Medication Sig ? fluticasone propionate (FLONASE) 50 mcg/actuation nasal spray Use 2 sprays in each nostril nightly. ? hydroxyurea (HYDREA) 500 mg capsule 3 tab daily for blood. ? ibuprofen (ADVIL,MOTRIN) 600 mg tablet Up to 4 tablets daily as needed for pain. ? naloxone (NARCAN) 4 mg/actuation nasal spray Use 1 spray in 1 nostril for suspected opioid overdose. May repeat in 2 minutes in other nostril with new device if minimal or no response. ? oxyCODONE (OXYCONTIN) 15 mg 12 hr extended release tablet Take 1 tablet (15 mg total) by mouth every 12 (twelve) hours. ? oxyCODONE (ROXICODONE) 15 mg Immediate Release tablet Take 1 tablet (15 mg total) by mouth every 4 (four) hours as needed for pain. No current facility-administered medications for this visit. AllergiesAllergies Allergen Reactions ? Nickel Rash  Interval Hx/ROSMitchel is a 26 y.o. 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 He was recently admitted in May 2023 for left thigh abscess (related to his internal hardware) and SCD related pain. The abscess was drained and a drain was placed and he was treated with Unasyn. Drain was removed prior to discharge. During this time his pain was managed with Dilaudid PCA. He was recently seen for follow up where observation was recommended. He has also been seen by his PCP for tonsillitis treated with Augmentin. He notes that his pain was worse during this time. Overall, he has sickle cell disease related pain that requires short acting medication at least three times weekly. He is currently on Oxycontin 15 mg BID and Oxycodone IR 15 mg q4. He notes that he takes between 90-120 mg for acute pain episodes.Phillip Bell is on disease modification with HU 1500 mg and is hesitant to increase his dose to 2000 mg because of additional tabs. He last saw opthalmology in October of 2022. He sees Dr. Cheri Kearns locally. He expresses questions about his fertility. He otherwise has no other questions at today's visit.PEVitals:  12/27/21 1418 BP: 109/69 Pulse: 75 Resp: 18 Temp: 98.6 ?F (37 ?C) Gen: no acute distressSkin: no rash to limited examEyes: no conjunctival injection; no ptosisENT: throat without erythema, exudateLymph: no adenopathy of neck, paraclavicular regions, axillaeResp: normal respiratory effort, lungs clear to ausculationCarVasc: normal rate and rhythm; normal pulse; no lower extremity edemaAbdm: soft, no  massesMusc-Skel: sits with normal posture; ambulates normallyPsych: affect normalLABSRecent Results (from the past 48 hour(s)) Reticulocytes  Collection Time: 12/27/21  3:37 PM Result Value Ref Range  Immature Reticulocyte Fraction 31.0 (H) 3.0 - 15.9 %  Reticulocyte Hgb Equivalent 34.6 28.2 - 35.7 pg  Percent Reticulocyte 6.5 (H) 0.6 - 2.7 %  Absolute Reticulocyte 0.152 (H) 0.023 - 0.140 10?6 cells/uL FERRITIN  Collection Time: 12/27/21  3:37 PM Result Value Ref Range  Ferritin 1,208 (H) 30 - 400 ng/mL Lactate dehydrogenase  Collection Time: 12/27/21  3:37 PM Result Value Ref Range  LD 452 (H) 122 - 241 U/L CBC auto differential  Collection Time: 12/27/21  3:37 PM Result Value Ref Range  WBC 12.9 (H) 4.0 - 11.0 x1000/?L  RBC 2.35 (L) 4.00 - 6.00 M/?L  Hemoglobin 8.1 (L) 13.2 - 17.1 g/dL  Hematocrit 16.10 (L) 96.04 - 50.00 %  MCV 99.1 80.0 - 100.0 fL  MCH 34.5 (H) 27.0 - 33.0 pg  MCHC 34.8 31.0 - 36.0 g/dL  RDW-CV 54.0 (H) 98.1 - 15.0 %  Platelets 878 (H) 150 - 420 x1000/?L  MPV 8.6 8.0 - 12.0 fL  Neutrophils 68.4 39.0 - 72.0 %  Lymphocytes 22.2 17.0 - 50.0 %  Monocytes 6.3 4.0 - 12.0 %  Eosinophils 2.3 0.0 - 5.0 %  Basophil 0.5 0.0 - 1.4 %  Immature Granulocytes 0.3 0.0 - 1.0 %  nRBC 0.9 0.0 - 1.0 %  ANC(Abs Neutrophil Count) 8.85 (H) 2.00 - 7.60 x 1000/?L  Absolute Lymphocyte Count 2.87 0.60 - 3.70 x 1000/?L  Monocyte Absolute Count 0.82 0.00 - 1.00 x 1000/?L  Eosinophil Absolute Count 0.30 0.00 - 1.00 x 1000/?L  Basophil Absolute Count 0.06 0.00 - 1.00 x 1000/?L  Absolute Immature Granulocyte Count 0.04 0.00 - 0.30 x 1000/?L  Absolute nRBC 0.12 0.00 - 1.00 x 1000/?L Comprehensive metabolic panel  Collection Time: 12/27/21  3:37 PM Result Value Ref Range  Sodium 139 136 - 144 mmol/L  Potassium 4.3 3.3 - 5.3 mmol/L  Chloride 105 98 - 107 mmol/L  CO2 23 20 - 30 mmol/L  Anion Gap 11 7 - 17  Glucose 101 (H) 70 - 100 mg/dL  BUN 7 6 - 20 mg/dL  Creatinine 1.91 4.78 - 1.30 mg/dL  Calcium 9.2 8.8 - 29.5 mg/dL  BUN/Creatinine Ratio 62.1 8.0 - 23.0  Total Protein 7.8 6.6 - 8.7 g/dL  Albumin 4.2 3.6 - 4.9 g/dL  Total Bilirubin 1.6 (H) <=1.2 mg/dL  Alkaline Phosphatase 308 (H) 9 - 122 U/L  Alanine Aminotransferase (ALT) 47 9 - 59 U/L  Aspartate Aminotransferase (AST) 100 (H) 10 - 35 U/L  Globulin 3.6 (H) 2.3 - 3.5 g/dL  A/G Ratio 1.2 1.0 - 2.2  AST/ALT Ratio 2.1 See Comment  eGFR (Creatinine) >60 >=60 mL/min/1.58m2 Imp/PlanCovid-19?	07/28/2021:  VaccinatedSickle cell disease (HbSS)?	12/27/21: Given frequent pain episodes, will increase HU to 2000 mg today. Rx sent. Labs above obtained. Transfusion Hx?	09/11/2019: Transfusion  and [facility] network in [place]. Social situation?	12/27/21: Works for [employer] ([employer]); lives soloFemur Fx?	12/27/21: s/p OR June 2021 and March 2022; last seen May 2023 where observation is recommendedIntermittent pain?	12/27/21: Discussed risks and benefits of chronic opioid therapy. Will work to optimize disease management and decrease short acting opioids as toleratedMedical marijuana?	Discussed 07/17/17. Certified and accessed thereafter. ?	10/09/2019: Re-certified 3/21.?	01/16/2020: Not interested at this time.?	12/27/21: not interested at this timeHealth MaintenanceFYI: 06/26/23Urine tox screen: OK 07/17/17.PMP: OK 2/8/2023Naloxone: in possession 3/10/2021Urine prot/creat ratio: 0.09  1/19Iron status: Ferritin elevated post transfusionIV access: OKCigarettes: noAlcohol: occassionalEye: Sees community MD ophth Dr Luanne Bras: reports q6mVaccine/AntibodiesInfluenza: 10/2019PPV23: 10/15, 3/14PCV13: 7/12Men-4: 7/14, 7/11Men-B: 7/19Tdap: 4/21Hep A: Ab pos 1/19Hep B: sAb neg 1/19.  Dose #1 09/07/17; Dose #2 04/16/18 #3 4/07/2021Hep C: Ab neg 1/19Varicella: Ab pos 8/18. HPV: 7/19, 7/14, 8/13HIV: neg 1/19GuidelineInfluenza	Annually				Varicella	Evidence of immunity:PPV23		2 doses 5 y apart; peds doses count			Hx vaccine x 2		3rd dose age 80 					[place] born before 41		8 wks from PCV13					Hx varicella			1 mo from Men-4/Men-B 				Hx varicella zosterPCV13		1 lifetime dose						Ab pos		1 y from PPV23								1 mo from Men-4/Men-B 		Zoster		60+Men-4		2 or 3 doses depending upon vaccine		2 mo apart		1 mo from PPV23/PCV13		HPV		Women thru age 26Men-B		2 doses 2 mo apart		1 mo from PPV23/PCV13				0, 1, 4 moTdap		Every 10 years				HIV		Ab screenHep A		2 doses 6 mo apartHep B		0, 1, 4 moHep C		Ab screen onceDispositionRTC in  2 monthsOn the day of this patient's encounter, a total of 45 minutes was personally spent by me.  This does not include any resident/fellow teaching time, or any time spent performing a procedural service.

## 2021-12-28 NOTE — Progress Notes
SOCIAL WORK NOTEPatient Name: Phillip RiceMedical Record Number: [id number] Date of Birth: Jul 10, 1997Medical Social Work Follow Up  [employer] Most Recent Value Admission Information  Document Type Progress Note Reason for Current Social Work Involvement Non-Consult Assessment/Note Source of Information Patient Record Reviewed Yes Level of Care Ambulatory Psychosocial issues requiring intervention Routine social work follow up Psychosocial interventions 15 minutes spent face to face during clinic appointment in adult hematology clinic. Mr. Stowers reports he is doing well and denied any psychosocial concerns at this time. He reports he is working full-time and enjoying doing so. He reports he is insured and denied any issues with insurance. I encouraged him to call with any other concerns. He agreed to do so. I will remain available as needed. [facility] Cell Team Specific referrals to enhance community supports (include existing and new resources) Not applicable Handoff Required? No Next Steps/Plan (including hand-off): Social work intervention completed. Social work will remain available as needed. Signature: Coral Spikes, LMSW Contact Information: [phone]

## 2021-12-29 LAB — HEMOGLOBINOPATHY EVALUATION SCREENING
BKR HEMOGLOBIN A2 QUANTITATION: 3.6 % (ref 0.0–4.0)
BKR HEMOGLOBIN F QUANTITATION: 14.3 % — ABNORMAL HIGH (ref 0.0–2.0)
BKR HGB S: 82.1 % — ABNORMAL HIGH

## 2022-01-05 ENCOUNTER — Encounter
Admit: 2022-01-05 | Payer: PRIVATE HEALTH INSURANCE | Attending: Student in an Organized Health Care Education/Training Program | Primary: Student in an Organized Health Care Education/Training Program

## 2022-01-05 MED ORDER — FLUTICASONE PROPIONATE 50 MCG/ACTUATION NASAL SPRAY,SUSPENSION
50 | NASAL | 2 refills | 30.00000 days | Status: AC
Start: 2022-01-05 — End: 2022-05-11

## 2022-01-07 ENCOUNTER — Encounter
Admit: 2022-01-07 | Payer: PRIVATE HEALTH INSURANCE | Attending: Medical Oncology | Primary: Student in an Organized Health Care Education/Training Program

## 2022-01-07 ENCOUNTER — Telehealth
Admit: 2022-01-07 | Payer: PRIVATE HEALTH INSURANCE | Attending: Family | Primary: Student in an Organized Health Care Education/Training Program

## 2022-01-07 DIAGNOSIS — D571 Sickle-cell disease without crisis: Secondary | ICD-10-CM

## 2022-01-07 MED ORDER — OXYCODONE IMMEDIATE RELEASE 15 MG TABLET
15 mg | ORAL_TABLET | ORAL | 1 refills | Status: AC | PRN
Start: 2022-01-07 — End: 2022-01-26

## 2022-01-07 NOTE — Telephone Encounter
Returned call to patient who stated that he is having 8/10 pain in his back and legs. He said that he did too much this week, and he thinks it is catching up with him. He states that he is taking 2 oxy every 4 hours for pain. I advised him that the directions say to take 1 tab every 4 hours. He states that he has ibuprofen, but he does not take it often, as it upsets his stomach. He denies fever/chills, chest pain, SOB, nausea, vomiting. He does not want to come in for treatment, just a refill on his oxycodone. Advised him that I will ask the providers to send in a refill. I also advised him to that he can take up to 4 tabs of ibuprofen a day, and to take it with food. Advised him to utilize hot packs, and to stay hydrated. He verbalized his understanding and appreciated the call.

## 2022-01-07 NOTE — Telephone Encounter
Hello,Patient called the office to request a medication refill for oxyCODONE (ROXICODONE) 15 mg Immediate Release tablet be sent to [id number] [place], [place] - [address] Patient stated that he is having a small pain crisis in his back and legs, and is requesting a call back from the nurse. Please advise.

## 2022-01-07 NOTE — Telephone Encounter
Refill for oxycodone electronically sent to Pharmacy

## 2022-01-26 ENCOUNTER — Telehealth
Admit: 2022-01-26 | Payer: PRIVATE HEALTH INSURANCE | Attending: Family | Primary: Student in an Organized Health Care Education/Training Program

## 2022-01-26 DIAGNOSIS — D571 Sickle-cell disease without crisis: Secondary | ICD-10-CM

## 2022-01-26 MED ORDER — IBUPROFEN 600 MG TABLET
600 mg | ORAL_TABLET | 2 refills | Status: AC
Start: 2022-01-26 — End: 2022-04-21

## 2022-01-26 MED ORDER — OXYCODONE ER 15 MG TABLET,CRUSH RESISTANT,EXTENDED RELEASE 12 HR
15 mg | ORAL_TABLET | Freq: Two times a day (BID) | ORAL | 1 refills | Status: AC
Start: 2022-01-26 — End: 2022-02-24

## 2022-01-26 MED ORDER — OXYCODONE IMMEDIATE RELEASE 15 MG TABLET
15 mg | ORAL_TABLET | ORAL | 1 refills | Status: AC | PRN
Start: 2022-01-26 — End: 2022-02-24

## 2022-01-26 NOTE — Telephone Encounter
Hello,Patient called the office to request medication refills for ibuprofen (ADVIL,MOTRIN) 600 mg tablet, oxyCODONE (OXYCONTIN) 15 mg 12 hr extended release tablet and oxyCODONE (ROXICODONE) 15 mg Immediate Release tablet be sent to [id number] [place], [place] - [address] Also, patient is requesting that the prescription quantity be dispensed for 90 tablets for his Oxycodone Immediate release tablet medication, as he stated he is completely out of medication. Please advise.

## 2022-02-23 ENCOUNTER — Encounter
Admit: 2022-02-23 | Payer: PRIVATE HEALTH INSURANCE | Attending: Pediatric Hematology-Oncology | Primary: Student in an Organized Health Care Education/Training Program

## 2022-02-23 ENCOUNTER — Encounter
Admit: 2022-02-23 | Payer: PRIVATE HEALTH INSURANCE | Attending: Family | Primary: Student in an Organized Health Care Education/Training Program

## 2022-02-23 DIAGNOSIS — D571 Sickle-cell disease without crisis: Secondary | ICD-10-CM

## 2022-02-23 NOTE — Telephone Encounter
Patient is requesting med refills on 3 medications.hydroxyurea (HYDREA) 500 mg capsuleoxyCODONE (ROXICODONE) 15 mg Immediate Release tabletoxyCODONE (OXYCONTIN) 15 mg 12 hr extended release tablet

## 2022-02-24 ENCOUNTER — Telehealth
Admit: 2022-02-24 | Payer: PRIVATE HEALTH INSURANCE | Attending: Family | Primary: Student in an Organized Health Care Education/Training Program

## 2022-02-24 ENCOUNTER — Encounter
Admit: 2022-02-24 | Payer: PRIVATE HEALTH INSURANCE | Attending: Family | Primary: Student in an Organized Health Care Education/Training Program

## 2022-02-24 DIAGNOSIS — D571 Sickle-cell disease without crisis: Secondary | ICD-10-CM

## 2022-02-24 MED ORDER — HYDROXYUREA 500 MG CAPSULE
500 mg | ORAL_CAPSULE | Freq: Every day | ORAL | 3 refills | Status: AC
Start: 2022-02-24 — End: 2022-03-31

## 2022-02-24 MED ORDER — OXYCODONE IMMEDIATE RELEASE 15 MG TABLET
15 mg | ORAL_TABLET | ORAL | 1 refills | Status: AC | PRN
Start: 2022-02-24 — End: 2022-03-10

## 2022-02-24 MED ORDER — OXYCODONE ER 15 MG TABLET,CRUSH RESISTANT,EXTENDED RELEASE 12 HR
15 mg | ORAL_TABLET | Freq: Two times a day (BID) | ORAL | 1 refills | Status: AC
Start: 2022-02-24 — End: 2022-03-31

## 2022-02-24 NOTE — Telephone Encounter
Phillip Bell called the office for a 3 prescription refill for oxyCODONE (OXYCONTIN) 15 mg 12 hr extended release tablet,oxyCODONE (ROXICODONE) 15 mg Immediate Release tablet and hydroxyurea (HYDREA) 500 mg capsule [id number] [place]  - [address]

## 2022-03-10 ENCOUNTER — Telehealth
Admit: 2022-03-10 | Payer: PRIVATE HEALTH INSURANCE | Attending: Family | Primary: Student in an Organized Health Care Education/Training Program

## 2022-03-10 ENCOUNTER — Encounter
Admit: 2022-03-10 | Payer: PRIVATE HEALTH INSURANCE | Attending: Family | Primary: Student in an Organized Health Care Education/Training Program

## 2022-03-10 DIAGNOSIS — D571 Sickle-cell disease without crisis: Secondary | ICD-10-CM

## 2022-03-10 MED ORDER — OXYCODONE IMMEDIATE RELEASE 15 MG TABLET
15 mg | ORAL_TABLET | ORAL | 1 refills | Status: AC | PRN
Start: 2022-03-10 — End: 2022-03-31

## 2022-03-10 NOTE — Telephone Encounter
Hello,Patient called the office to request a medication refill for: oxyCODONE (ROXICODONE) 15 mg Immediate Release tablet be sent to [id number] - [place], [place] - [address] Patient stated that he is having a medium sickle cell crisis right now, and is requesting a call back. Please advise.

## 2022-03-10 NOTE — Telephone Encounter
Returned call to patient who stated that he is having pain in his back and his legs. He is managing at home with oral pain meds and hydration. He does not want to come in at this time. He requested a refill on his oxycodone. I advised him that I will send in the refill request to the provider and advised him to call back if the pain gets worse. He verbalized his understanding and appreciated the call.

## 2022-03-28 ENCOUNTER — Ambulatory Visit
Admit: 2022-03-28 | Payer: BLUE CROSS/BLUE SHIELD | Attending: Pediatric Hematology-Oncology | Primary: Student in an Organized Health Care Education/Training Program

## 2022-03-28 ENCOUNTER — Encounter
Admit: 2022-03-28 | Payer: PRIVATE HEALTH INSURANCE | Attending: Pediatric Hematology-Oncology | Primary: Student in an Organized Health Care Education/Training Program

## 2022-03-28 ENCOUNTER — Inpatient Hospital Stay
Admit: 2022-03-28 | Discharge: 2022-03-28 | Payer: BLUE CROSS/BLUE SHIELD | Primary: Student in an Organized Health Care Education/Training Program

## 2022-03-28 ENCOUNTER — Encounter
Admit: 2022-03-28 | Payer: PRIVATE HEALTH INSURANCE | Attending: Vascular and Interventional Radiology | Primary: Student in an Organized Health Care Education/Training Program

## 2022-03-28 DIAGNOSIS — D571 Sickle-cell disease without crisis: Secondary | ICD-10-CM

## 2022-03-28 LAB — MANUAL DIFFERENTIAL
BKR WAM ABNORMAL LYMPHOCYTES (DIFF) 1 DEC: 1 % — ABNORMAL HIGH (ref 0.0–0.0)
BKR WAM ATYPICAL LYMPHOCYTES (DIFF) 1 DEC: 1 % (ref 0.0–1.0)
BKR WAM BASOPHIL - ABS (DIFF) 2 DEC: 0.08 x 1000/??L (ref 0.00–1.00)
BKR WAM BASOPHILS (DIFF): 1 % (ref 0.0–1.4)
BKR WAM EOSINOPHILS (DIFF) 2 DEC: 0.17 x 1000/??L (ref 0.00–1.00)
BKR WAM EOSINOPHILS (DIFF): 2 % (ref 0.0–5.0)
BKR WAM LYMPHOCYTE - ABS (DIFF) 2 DEC: 2.69 x 1000/??L (ref 0.60–3.70)
BKR WAM LYMPHOCYTES (DIFF): 30 % (ref 17.0–50.0)
BKR WAM METAMYELOCYTES (DIFF) 1 DEC: 1 % (ref 0.0–1.0)
BKR WAM MONOCYTE - ABS (DIFF) 2 DEC: 1.76 x 1000/??L — ABNORMAL HIGH (ref 0.00–1.00)
BKR WAM MONOCYTES (DIFF): 21 % — ABNORMAL HIGH (ref 4.0–12.0)
BKR WAM NEUTROPHILS (DIFF): 43 % (ref 39.0–72.0)
BKR WAM NEUTROPHILS - ABS (DIFF) 2 DEC: 3.61 x 1000/??L (ref 2.00–7.60)

## 2022-03-28 LAB — HEPATIC FUNCTION PANEL
BKR A/G RATIO: 1.5 (ref 1.0–2.2)
BKR ALANINE AMINOTRANSFERASE (ALT): 26 U/L (ref 9–59)
BKR ALBUMIN: 4.7 g/dL (ref 3.6–4.9)
BKR ALKALINE PHOSPHATASE: 99 U/L (ref 9–122)
BKR ASPARTATE AMINOTRANSFERASE (AST): 54 U/L — ABNORMAL HIGH (ref 10–35)
BKR AST/ALT RATIO: 2.1
BKR BILIRUBIN DIRECT: 0.3 mg/dL (ref <=0.3)
BKR BILIRUBIN TOTAL: 3.1 mg/dL — ABNORMAL HIGH (ref <=1.2)
BKR GLOBULIN: 3.1 g/dL (ref 2.3–3.5)
BKR PROTEIN TOTAL: 7.8 g/dL (ref 6.6–8.7)

## 2022-03-28 LAB — CBC WITH AUTO DIFFERENTIAL
BKR POTASSIUM: 22 % — ABNORMAL LOW (ref 38.50–50.00)
BKR WAM ABSOLUTE NRBC (2 DEC): 0.59 x 1000/??L (ref 0.00–1.00)
BKR WAM HEMATOCRIT (2 DEC): 22 % — ABNORMAL LOW (ref 38.50–50.00)
BKR WAM HEMOGLOBIN: 7.9 g/dL — ABNORMAL LOW (ref 13.2–17.1)
BKR WAM MCH (PG): 35.7 pg — ABNORMAL HIGH (ref 27.0–33.0)
BKR WAM MCHC: 35.9 g/dL (ref 31.0–36.0)
BKR WAM MCV: 99.5 fL (ref 80.0–100.0)
BKR WAM MPV: 9 fL (ref 8.0–12.0)
BKR WAM NUCLEATED RED BLOOD CELLS: 7.1 % — ABNORMAL HIGH (ref 0.0–1.0)
BKR WAM PLATELETS: 554 x1000/??L — ABNORMAL HIGH (ref 150–420)
BKR WAM RDW-CV: 20.1 % — ABNORMAL HIGH (ref 11.0–15.0)
BKR WAM RED BLOOD CELL COUNT.: 2.21 M/??L — ABNORMAL LOW (ref 4.00–6.00)
BKR WAM WHITE BLOOD CELL COUNT: 8.4 x1000/??L (ref 4.0–11.0)

## 2022-03-28 LAB — BASIC METABOLIC PANEL
BKR ANION GAP: 11 (ref 7–17)
BKR BLOOD UREA NITROGEN: 10 mg/dL — ABNORMAL HIGH (ref 6–20)
BKR BUN / CREAT RATIO: 13.5 x 1000/??L (ref 8.0–23.0)
BKR CALCIUM: 9.4 mg/dL (ref 8.8–10.2)
BKR CHLORIDE: 101 mmol/L (ref 98–107)
BKR CO2: 22 mmol/L (ref 20–30)
BKR CREATININE: 0.74 mg/dL (ref 0.40–1.30)
BKR EGFR, CREATININE (CKD-EPI 2021): >60 mL/min/1.73m2 (ref >=60)
BKR GLUCOSE: 106 mg/dL — ABNORMAL HIGH (ref 70–100)
BKR SODIUM: 134 mmol/L — ABNORMAL LOW (ref 136–144)

## 2022-03-28 LAB — PROTEIN, TOTAL W/CREATININE, URINE, RANDOM     (BH GH LMW YH)
BKR CREATININE, URINE, RANDOM: 91 mg/dL
BKR PROTEIN URINE RANDOM: 0.07 g/L
BKR PROTEIN/CREATININE RATIO, URINE, RANDOM: 0.08 mg/mg{creat} (ref <0.10)

## 2022-03-28 LAB — RETICULOCYTES
BKR WAM IRF: 52.9 % — ABNORMAL HIGH (ref 3.0–15.9)
BKR WAM RETICULOCYTE - ABS (3 DEC): 0.311 10??6 cells/uL — ABNORMAL HIGH (ref 0.023–0.140)
BKR WAM RETICULOCYTE COUNT PCT (1 DEC): 14.1 % — ABNORMAL HIGH (ref 0.6–2.7)
BKR WAM RETICULOCYTE HGB EQUIVALENT: 32.5 pg (ref 28.2–35.7)

## 2022-03-28 LAB — HEPATITIS B SURFACE ANTIGEN     (BH GH L LMW YH): BKR HEPATITIS B SURFACE ANTIGEN: NEGATIVE

## 2022-03-28 LAB — TREPONEMA PALLIDUM (SYPHILIS) ANTIBODY W/REFLEX
BKR TREPONEMA PALLIDUM ANTIBODY INITIAL RESULT: <0.1 Index
BKR TREPONEMA PALLIDUM ANTIBODY TOTAL, SERUM: NONREACTIVE

## 2022-03-28 LAB — HEPATITIS B SURFACE ANTIBODY     (BH GH L LMW YH): BKR HEP B SURFACE AB INITIAL RESULT: 218.16 mIU/mL (ref >=12)

## 2022-03-28 LAB — HIV-1/HIV-2 ANTIBODY/ANTIGEN SCREEN W/REFLEX     (BH GH LMW YH): BKR HIV 1 AND 2 ANTIBODY/HIV-1 ANTIGEN SCREEN: NEGATIVE mg/dL

## 2022-03-28 LAB — HEPATITIS B CORE ANTIBODY, TOTAL: BKR HEPATITIS B CORE TOTAL ANTIBODY: NEGATIVE

## 2022-03-28 LAB — FERRITIN: BKR FERRITIN: 1274 ng/mL — ABNORMAL HIGH (ref 30–400)

## 2022-03-29 ENCOUNTER — Encounter
Admit: 2022-03-29 | Payer: PRIVATE HEALTH INSURANCE | Attending: Pediatric Hematology-Oncology | Primary: Student in an Organized Health Care Education/Training Program

## 2022-03-29 DIAGNOSIS — D5700 Hb-SS disease with crisis, unspecified: Secondary | ICD-10-CM

## 2022-03-29 DIAGNOSIS — G8929 Other chronic pain: Secondary | ICD-10-CM

## 2022-03-29 DIAGNOSIS — D571 Sickle-cell disease without crisis: Secondary | ICD-10-CM

## 2022-03-29 DIAGNOSIS — Z7964 On hydroxyurea therapy: Secondary | ICD-10-CM

## 2022-03-29 DIAGNOSIS — M87052 Idiopathic aseptic necrosis of left femur: Secondary | ICD-10-CM

## 2022-03-29 DIAGNOSIS — K219 Gastro-esophageal reflux disease without esophagitis: Secondary | ICD-10-CM

## 2022-03-29 DIAGNOSIS — H902 Conductive hearing loss, unspecified: Secondary | ICD-10-CM

## 2022-03-29 DIAGNOSIS — IMO0002 Dactylitis: Secondary | ICD-10-CM

## 2022-03-29 DIAGNOSIS — D6189 Other specified aplastic anemias and other bone marrow failure syndromes: Secondary | ICD-10-CM

## 2022-03-29 DIAGNOSIS — D7389 Other diseases of spleen: Secondary | ICD-10-CM

## 2022-03-29 DIAGNOSIS — Z23 Encounter for immunization: Secondary | ICD-10-CM

## 2022-03-29 LAB — CHLAMYDIA TRACHOMATIS, NAAT     (BH GH LMW YH): BKR CHLAMYDIA, DNA PROBE: NEGATIVE

## 2022-03-29 LAB — NEISSERIA GONORRHEA, NAAT     (BH GH L LMW YH): BKR NEISSERIA GONORRHOEAE, DNA PROBE: NEGATIVE % — ABNORMAL HIGH (ref 0.0–1.0)

## 2022-03-29 MED ORDER — OXYCODONE ER 15 MG TABLET,CRUSH RESISTANT,EXTENDED RELEASE 12 HR
15 mg | Freq: Two times a day (BID) | ORAL | Status: AC
Start: 2022-03-29 — End: ?

## 2022-03-30 LAB — HEMOGLOBINOPATHY EVALUATION SCREENING
BKR HEMOGLOBIN A2 QUANTITATION: 3.4 % (ref 0.0–4.0)
BKR HEMOGLOBIN F QUANTITATION: 12.3 % — ABNORMAL HIGH (ref 0.0–2.0)
BKR HGB S: 84.3 % — ABNORMAL HIGH (ref <0.10)

## 2022-03-31 ENCOUNTER — Telehealth
Admit: 2022-03-31 | Payer: PRIVATE HEALTH INSURANCE | Attending: Family | Primary: Student in an Organized Health Care Education/Training Program

## 2022-03-31 DIAGNOSIS — D571 Sickle-cell disease without crisis: Secondary | ICD-10-CM

## 2022-03-31 MED ORDER — OXYCODONE IMMEDIATE RELEASE 15 MG TABLET
15 mg | ORAL_TABLET | ORAL | 1 refills | Status: AC | PRN
Start: 2022-03-31 — End: 2022-04-21

## 2022-03-31 MED ORDER — OXYCODONE ER 15 MG TABLET,CRUSH RESISTANT,EXTENDED RELEASE 12 HR
15 mg | ORAL_TABLET | Freq: Two times a day (BID) | ORAL | 1 refills | Status: AC
Start: 2022-03-31 — End: 2022-04-21

## 2022-03-31 MED ORDER — HYDROXYUREA 500 MG CAPSULE
500 mg | ORAL_CAPSULE | Freq: Every day | ORAL | 3 refills | Status: AC
Start: 2022-03-31 — End: 2022-06-13

## 2022-03-31 NOTE — Telephone Encounter
Patient called in and stated he needs refill on hydroxyurea (HYDREA) 500 mg capsule, oxyCODONE (ROXICODONE) 15 mg Immediate Release tablet, and oxyCODONE (OXYCONTIN) 15 mg 12 hr extended release tablet - he would like medications sent to : [id number] [place], [place] - [address]
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
## 2022-04-21 ENCOUNTER — Encounter
Admit: 2022-04-21 | Payer: PRIVATE HEALTH INSURANCE | Attending: Family | Primary: Student in an Organized Health Care Education/Training Program

## 2022-04-21 ENCOUNTER — Telehealth
Admit: 2022-04-21 | Payer: PRIVATE HEALTH INSURANCE | Attending: Family | Primary: Student in an Organized Health Care Education/Training Program

## 2022-04-21 DIAGNOSIS — D571 Sickle-cell disease without crisis: Secondary | ICD-10-CM

## 2022-04-21 MED ORDER — OXYCODONE ER 15 MG TABLET,CRUSH RESISTANT,EXTENDED RELEASE 12 HR
15 mg | ORAL_TABLET | Freq: Two times a day (BID) | ORAL | 1 refills | Status: AC
Start: 2022-04-21 — End: 2022-05-13

## 2022-04-21 MED ORDER — IBUPROFEN 600 MG TABLET
600 mg | ORAL_TABLET | 2 refills | Status: AC
Start: 2022-04-21 — End: ?

## 2022-04-21 MED ORDER — OXYCODONE IMMEDIATE RELEASE 15 MG TABLET
15 mg | ORAL_TABLET | ORAL | 1 refills | Status: AC | PRN
Start: 2022-04-21 — End: 2022-05-13

## 2022-04-21 NOTE — Telephone Encounter
Patient called in and is requesting refill on oxyCODONE (OXYCONTIN) 15 mg 12 hr extended release tablet, oxyCODONE (ROXICODONE) 15 mg Immediate Release tablet as well as ibuprofen (ADVIL,MOTRIN) 600 mg tablet [id number] [place], [place] - [address]

## 2022-05-11 ENCOUNTER — Ambulatory Visit
Admit: 2022-05-11 | Payer: BLUE CROSS/BLUE SHIELD | Attending: Student in an Organized Health Care Education/Training Program | Primary: Student in an Organized Health Care Education/Training Program

## 2022-05-11 ENCOUNTER — Ambulatory Visit
Admit: 2022-05-11 | Payer: BLUE CROSS/BLUE SHIELD | Primary: Student in an Organized Health Care Education/Training Program

## 2022-05-11 ENCOUNTER — Encounter
Admit: 2022-05-11 | Payer: PRIVATE HEALTH INSURANCE | Attending: Student in an Organized Health Care Education/Training Program | Primary: Student in an Organized Health Care Education/Training Program

## 2022-05-11 MED ORDER — AMOXICILLIN 875 MG-POTASSIUM CLAVULANATE 125 MG TABLET
875-125 | ORAL_TABLET | Freq: Two times a day (BID) | ORAL | 1 refills | 10.00000 days | Status: AC
Start: 2022-05-11 — End: ?

## 2022-05-11 MED ORDER — FLUTICASONE PROPIONATE 50 MCG/ACTUATION NASAL SPRAY,SUSPENSION
50 | Freq: Every day | NASAL | 12 refills | 30.00000 days | Status: AC
Start: 2022-05-11 — End: 2023-06-23

## 2022-05-11 MED ORDER — FLUTICASONE PROPIONATE 50 MCG/ACTUATION NASAL SPRAY,SUSPENSION
50 | Freq: Two times a day (BID) | NASAL | 2 refills | 30.00000 days | Status: AC
Start: 2022-05-11 — End: 2023-01-18

## 2022-05-13 ENCOUNTER — Encounter
Admit: 2022-05-13 | Payer: PRIVATE HEALTH INSURANCE | Attending: Family | Primary: Student in an Organized Health Care Education/Training Program

## 2022-05-13 ENCOUNTER — Telehealth
Admit: 2022-05-13 | Payer: PRIVATE HEALTH INSURANCE | Attending: Pediatric Hematology-Oncology | Primary: Student in an Organized Health Care Education/Training Program

## 2022-05-13 DIAGNOSIS — D571 Sickle-cell disease without crisis: Secondary | ICD-10-CM

## 2022-05-13 MED ORDER — OXYCODONE ER 15 MG TABLET,CRUSH RESISTANT,EXTENDED RELEASE 12 HR
15 mg | ORAL_TABLET | Freq: Two times a day (BID) | ORAL | 1 refills | Status: AC
Start: 2022-05-13 — End: 2022-06-13

## 2022-05-13 MED ORDER — OXYCODONE IMMEDIATE RELEASE 15 MG TABLET
15 mg | ORAL_TABLET | ORAL | 1 refills | Status: AC | PRN
Start: 2022-05-13 — End: 2022-06-13

## 2022-05-13 NOTE — Telephone Encounter
Patient called office requesting refill on oxyCODONE (ROXICODONE) 15 mg Immediate Release tablet sent to [id number] [place], [place] - [address]

## 2022-06-13 ENCOUNTER — Telehealth
Admit: 2022-06-13 | Payer: PRIVATE HEALTH INSURANCE | Attending: Pediatric Hematology-Oncology | Primary: Student in an Organized Health Care Education/Training Program

## 2022-06-13 ENCOUNTER — Encounter
Admit: 2022-06-13 | Payer: PRIVATE HEALTH INSURANCE | Attending: Family | Primary: Student in an Organized Health Care Education/Training Program

## 2022-06-13 DIAGNOSIS — D571 Sickle-cell disease without crisis: Secondary | ICD-10-CM

## 2022-06-13 MED ORDER — OXYCODONE ER 15 MG TABLET,CRUSH RESISTANT,EXTENDED RELEASE 12 HR
15 mg | ORAL_TABLET | Freq: Two times a day (BID) | ORAL | 1 refills | Status: AC
Start: 2022-06-13 — End: ?

## 2022-06-13 MED ORDER — HYDROXYUREA 500 MG CAPSULE
500 mg | ORAL_CAPSULE | Freq: Every day | ORAL | 3 refills | Status: AC
Start: 2022-06-13 — End: ?

## 2022-06-13 MED ORDER — OXYCODONE IMMEDIATE RELEASE 15 MG TABLET
15 mg | ORAL_TABLET | ORAL | 1 refills | Status: AC | PRN
Start: 2022-06-13 — End: ?

## 2022-06-13 NOTE — Telephone Encounter
Patient requests refills on oxyCODONE (ROXICODONE) 15 mg Immediate Release tablet, oxyCODONE (OXYCONTIN) 15 mg 12 hr extended release tablet, and hydroxyurea (HYDREA) 500 mg capsule [facility] [address] [place]

## 2022-07-07 ENCOUNTER — Telehealth
Admit: 2022-07-07 | Payer: PRIVATE HEALTH INSURANCE | Attending: Family | Primary: Student in an Organized Health Care Education/Training Program

## 2022-07-07 ENCOUNTER — Encounter
Admit: 2022-07-07 | Payer: PRIVATE HEALTH INSURANCE | Attending: Family | Primary: Student in an Organized Health Care Education/Training Program

## 2022-07-07 DIAGNOSIS — D571 Sickle-cell disease without crisis: Secondary | ICD-10-CM

## 2022-07-07 MED ORDER — IBUPROFEN 600 MG TABLET
600 mg | ORAL_TABLET | 2 refills | Status: AC
Start: 2022-07-07 — End: ?

## 2022-07-07 MED ORDER — OXYCODONE IMMEDIATE RELEASE 15 MG TABLET
15 mg | ORAL_TABLET | ORAL | 1 refills | Status: AC | PRN
Start: 2022-07-07 — End: 2022-08-08

## 2022-07-07 MED ORDER — OXYCODONE ER 15 MG TABLET,CRUSH RESISTANT,EXTENDED RELEASE 12 HR
15 mg | ORAL_TABLET | Freq: Two times a day (BID) | ORAL | 1 refills | Status: AC
Start: 2022-07-07 — End: 2022-08-08

## 2022-07-07 NOTE — Telephone Encounter
Patient called in to request refill on ibuprofen (ADVIL,MOTRIN) 600 mg tablet and oxyCODONE (OXYCONTIN) 15 mg 12 hr extended release tablet and oxyCODONE (ROXICODONE) 15 mg Immediate Release tablet be [facility] to [id number] [place], [place] - [address]

## 2022-07-25 ENCOUNTER — Ambulatory Visit
Admit: 2022-07-25 | Payer: BLUE CROSS/BLUE SHIELD | Attending: Pediatric Hematology-Oncology | Primary: Student in an Organized Health Care Education/Training Program

## 2022-08-08 ENCOUNTER — Encounter
Admit: 2022-08-08 | Payer: PRIVATE HEALTH INSURANCE | Attending: Family | Primary: Student in an Organized Health Care Education/Training Program

## 2022-08-08 ENCOUNTER — Telehealth
Admit: 2022-08-08 | Payer: PRIVATE HEALTH INSURANCE | Attending: Family | Primary: Student in an Organized Health Care Education/Training Program

## 2022-08-08 DIAGNOSIS — D571 Sickle-cell disease without crisis: Secondary | ICD-10-CM

## 2022-08-08 MED ORDER — OXYCODONE IMMEDIATE RELEASE 15 MG TABLET
15 mg | ORAL_TABLET | ORAL | 1 refills | Status: AC | PRN
Start: 2022-08-08 — End: ?

## 2022-08-08 MED ORDER — HYDROXYUREA 500 MG CAPSULE
500 mg | ORAL_CAPSULE | Freq: Every day | ORAL | 3 refills | Status: AC
Start: 2022-08-08 — End: ?

## 2022-08-08 MED ORDER — OXYCODONE ER 15 MG TABLET,CRUSH RESISTANT,EXTENDED RELEASE 12 HR
15 mg | ORAL_TABLET | Freq: Two times a day (BID) | ORAL | 1 refills | Status: AC
Start: 2022-08-08 — End: ?

## 2022-08-08 NOTE — Telephone Encounter
Hello Patient called the office  for a prescription refill for 3 medications  oxyCODONE (OXYCONTIN) 15 mg 12 hr extended release tabletoxyCODONE (ROXICODONE) 15 mg Immediate Release tablet and hydroxyurea (HYDREA) 500 mg capsule [id number] [place],  - [address]

## 2022-08-08 NOTE — Telephone Encounter
Hi,All set.Contacted the patient to schedule an appointment. The follow up appointment is scheduled on, 08/15/2022 at 1:30pm with Dr.Calhoun.Sonia Baller

## 2022-08-15 ENCOUNTER — Ambulatory Visit
Admit: 2022-08-15 | Payer: BLUE CROSS/BLUE SHIELD | Attending: Pediatric Hematology-Oncology | Primary: Student in an Organized Health Care Education/Training Program

## 2022-08-22 ENCOUNTER — Ambulatory Visit
Admit: 2022-08-22 | Payer: BLUE CROSS/BLUE SHIELD | Attending: Pediatric Hematology-Oncology | Primary: Student in an Organized Health Care Education/Training Program

## 2022-08-22 ENCOUNTER — Inpatient Hospital Stay
Admit: 2022-08-22 | Discharge: 2022-08-22 | Payer: BLUE CROSS/BLUE SHIELD | Primary: Student in an Organized Health Care Education/Training Program

## 2022-08-22 ENCOUNTER — Telehealth
Admit: 2022-08-22 | Payer: PRIVATE HEALTH INSURANCE | Attending: Pediatric Hematology-Oncology | Primary: Student in an Organized Health Care Education/Training Program

## 2022-08-22 ENCOUNTER — Telehealth
Admit: 2022-08-22 | Payer: PRIVATE HEALTH INSURANCE | Attending: Family | Primary: Student in an Organized Health Care Education/Training Program

## 2022-08-22 DIAGNOSIS — D571 Sickle-cell disease without crisis: Secondary | ICD-10-CM

## 2022-08-22 LAB — CBC WITH AUTO DIFFERENTIAL
BKR WAM ABSOLUTE IMMATURE GRANULOCYTES.: 0.05 x 1000/ÂµL (ref 0.00–0.30)
BKR WAM ABSOLUTE LYMPHOCYTE COUNT.: 3.93 x 1000/ÂµL — ABNORMAL HIGH (ref 0.60–3.70)
BKR WAM ABSOLUTE NRBC (2 DEC): 0.69 x 1000/ÂµL (ref 0.00–1.00)
BKR WAM ANALYZER ANC: 3.62 x 1000/??L (ref 2.00–7.60)
BKR WAM BASOPHIL ABSOLUTE COUNT.: 0.08 x 1000/??L (ref 0.00–1.00)
BKR WAM BASOPHILS: 0.9 % (ref 0.0–1.4)
BKR WAM EOSINOPHIL ABSOLUTE COUNT.: 0.49 x 1000/??L (ref 0.00–1.00)
BKR WAM EOSINOPHILS: 5.4 % — ABNORMAL HIGH (ref 0.0–5.0)
BKR WAM HEMATOCRIT (2 DEC): 20.8 % — ABNORMAL LOW (ref 38.50–50.00)
BKR WAM HEMOGLOBIN: 7.5 g/dL — ABNORMAL LOW (ref 13.2–17.1)
BKR WAM IMMATURE GRANULOCYTES: 0.5 % (ref 0.0–1.0)
BKR WAM LYMPHOCYTES: 43 % (ref 17.0–50.0)
BKR WAM MCH (PG): 35.7 pg — ABNORMAL HIGH (ref 27.0–33.0)
BKR WAM MCHC: 36.1 g/dL — ABNORMAL HIGH (ref 31.0–36.0)
BKR WAM MCV: 99 fL (ref 80.0–100.0)
BKR WAM MONOCYTE ABSOLUTE COUNT.: 0.97 x 1000/??L (ref 0.00–1.00)
BKR WAM MONOCYTES: 10.6 % (ref 4.0–12.0)
BKR WAM MPV: 9.1 fL (ref 8.0–12.0)
BKR WAM NEUTROPHILS: 39.6 % (ref 39.0–72.0)
BKR WAM NUCLEATED RED BLOOD CELLS: 7.5 % — ABNORMAL HIGH (ref 0.0–1.0)
BKR WAM PLATELETS: 778 x1000/??L — ABNORMAL HIGH (ref 150–420)
BKR WAM RDW-CV: 21.5 % — ABNORMAL HIGH (ref 11.0–15.0)
BKR WAM RED BLOOD CELL COUNT.: 2.1 M/??L — ABNORMAL LOW (ref 4.00–6.00)
BKR WAM WHITE BLOOD CELL COUNT: 9.1 x1000/??L (ref 4.0–11.0)

## 2022-08-22 LAB — RETICULOCYTES
BKR WAM IRF: 36.8 % — ABNORMAL HIGH (ref 3.0–15.9)
BKR WAM RETICULOCYTE - ABS (3 DEC): 0.154 10??6 cells/uL — ABNORMAL HIGH (ref 0.023–0.140)
BKR WAM RETICULOCYTE COUNT PCT (1 DEC): 7.3 % — ABNORMAL HIGH (ref 0.6–2.7)
BKR WAM RETICULOCYTE HGB EQUIVALENT: 36.9 pg — ABNORMAL HIGH (ref 28.2–35.7)
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
## 2022-08-22 NOTE — Telephone Encounter
Approved Spoke with Dyanne Carrel at [id number] initiated a prior authorization forOxycontin 15MG  er tablets approved from 08/22/2022 - 02/18/2023.  Reference# [id number].

## 2022-08-22 NOTE — Telephone Encounter
Patient calling, will need a pre auth for oxyCODONE (OXYCONTIN) 15 mg 12 hr extended release tablet. Was told previous order expired. Request call back from nursing

## 2022-09-02 ENCOUNTER — Encounter
Admit: 2022-09-02 | Payer: PRIVATE HEALTH INSURANCE | Attending: Emergency Medicine | Primary: Student in an Organized Health Care Education/Training Program

## 2022-09-02 ENCOUNTER — Encounter
Admit: 2022-09-02 | Payer: PRIVATE HEALTH INSURANCE | Attending: Family | Primary: Student in an Organized Health Care Education/Training Program

## 2022-09-02 ENCOUNTER — Telehealth
Admit: 2022-09-02 | Payer: PRIVATE HEALTH INSURANCE | Attending: Family | Primary: Student in an Organized Health Care Education/Training Program

## 2022-09-02 ENCOUNTER — Ambulatory Visit
Admit: 2022-09-02 | Payer: BLUE CROSS/BLUE SHIELD | Attending: Emergency Medicine | Primary: Student in an Organized Health Care Education/Training Program

## 2022-09-02 DIAGNOSIS — D571 Sickle-cell disease without crisis: Secondary | ICD-10-CM

## 2022-09-02 MED ORDER — OXYCODONE ER 15 MG TABLET,CRUSH RESISTANT,EXTENDED RELEASE 12 HR
15 mg | ORAL_TABLET | Freq: Two times a day (BID) | ORAL | 1 refills | Status: AC
Start: 2022-09-02 — End: ?

## 2022-09-02 MED ORDER — OXYCODONE IMMEDIATE RELEASE 15 MG TABLET
15 mg | ORAL_TABLET | ORAL | 1 refills | Status: AC | PRN
Start: 2022-09-02 — End: ?

## 2022-09-02 MED ORDER — AMOXICILLIN 875 MG-POTASSIUM CLAVULANATE 125 MG TABLET
875-125 | ORAL_TABLET | Freq: Two times a day (BID) | ORAL | 1 refills | 10.00000 days | Status: AC
Start: 2022-09-02 — End: ?

## 2022-09-02 NOTE — Telephone Encounter
Hi,Patient called the office, to request medication refills for oxyCODONE (OXYCONTIN) 15 mg 12 hr extended release tablet, oxyCODONE (ROXICODONE) 15 mg Immediate Release tablet and hydroxyurea (HYDREA) 500 mg capsule be sent to [id number] - [place], [address] - [address] Please advise.

## 2022-09-28 ENCOUNTER — Telehealth
Admit: 2022-09-28 | Payer: PRIVATE HEALTH INSURANCE | Attending: Pediatric Hematology-Oncology | Primary: Student in an Organized Health Care Education/Training Program

## 2022-09-28 ENCOUNTER — Encounter
Admit: 2022-09-28 | Payer: PRIVATE HEALTH INSURANCE | Attending: Family | Primary: Student in an Organized Health Care Education/Training Program

## 2022-09-28 DIAGNOSIS — D571 Sickle-cell disease without crisis: Secondary | ICD-10-CM

## 2022-09-28 MED ORDER — OXYCODONE IMMEDIATE RELEASE 15 MG TABLET
15 mg | ORAL_TABLET | ORAL | 1 refills | Status: AC | PRN
Start: 2022-09-28 — End: ?

## 2022-09-28 MED ORDER — OXYCODONE ER 15 MG TABLET,CRUSH RESISTANT,EXTENDED RELEASE 12 HR
15 mg | ORAL_TABLET | Freq: Two times a day (BID) | ORAL | 1 refills | Status: AC
Start: 2022-09-28 — End: ?

## 2022-09-28 NOTE — Telephone Encounter
Patient calling to requet refill on oxyCODONE (ROXICODONE) 15 mg Immediate Release tablet and ibuprofen (ADVIL,MOTRIN) 600 mg tablet . Please send to [id number] - [place], [place] - [address]

## 2022-10-11 ENCOUNTER — Telehealth
Admit: 2022-10-11 | Payer: PRIVATE HEALTH INSURANCE | Attending: Family | Primary: Student in an Organized Health Care Education/Training Program

## 2022-10-11 ENCOUNTER — Encounter
Admit: 2022-10-11 | Payer: PRIVATE HEALTH INSURANCE | Attending: Family | Primary: Student in an Organized Health Care Education/Training Program

## 2022-10-11 DIAGNOSIS — D571 Sickle-cell disease without crisis: Secondary | ICD-10-CM

## 2022-10-11 MED ORDER — OXYCODONE IMMEDIATE RELEASE 15 MG TABLET
15 mg | ORAL_TABLET | ORAL | 1 refills | Status: AC | PRN
Start: 2022-10-11 — End: ?

## 2022-10-11 NOTE — Telephone Encounter
Patient is experiencing a sickle cell crisis and is in a lot pain. He would like to know if we can send a medication refill over [facility] [place] [facility] [address] for Oxycodone 15mg  (immediate release) Please call pt with any questions [phone]. Thanks

## 2022-11-04 ENCOUNTER — Telehealth
Admit: 2022-11-04 | Payer: PRIVATE HEALTH INSURANCE | Attending: Pediatric Hematology-Oncology | Primary: Student in an Organized Health Care Education/Training Program

## 2022-11-04 DIAGNOSIS — D571 Sickle-cell disease without crisis: Secondary | ICD-10-CM

## 2022-11-04 MED ORDER — OXYCODONE ER 15 MG TABLET,CRUSH RESISTANT,EXTENDED RELEASE 12 HR
15 mg | ORAL_TABLET | Freq: Two times a day (BID) | ORAL | 1 refills | Status: AC
Start: 2022-11-04 — End: ?

## 2022-11-04 MED ORDER — OXYCODONE IMMEDIATE RELEASE 15 MG TABLET
15 mg | ORAL_TABLET | ORAL | 1 refills | Status: AC | PRN
Start: 2022-11-04 — End: ?

## 2022-11-04 NOTE — Telephone Encounter
Patient calling to request refill of hydroxyurea (HYDREA) 500 mg capsule, oxyCODONE (ROXICODONE) 15 mg Immediate Release tablet and oxyCODONE 15 mg extended release. Please send to pharmacy on file

## 2022-11-04 NOTE — Telephone Encounter
Message sent to provider for refillPt requesting refill oxy 15mg  IR, last refilled 10/11/22. Pt also requesting oxy 15mg  ER you refused refill mx times. Do you want me to tell him stop asking for this medication?Next appt:5/20

## 2022-11-21 ENCOUNTER — Encounter
Admit: 2022-11-21 | Payer: PRIVATE HEALTH INSURANCE | Primary: Student in an Organized Health Care Education/Training Program

## 2022-11-21 ENCOUNTER — Ambulatory Visit
Admit: 2022-11-21 | Payer: BLUE CROSS/BLUE SHIELD | Attending: Pediatric Hematology-Oncology | Primary: Student in an Organized Health Care Education/Training Program

## 2022-11-21 ENCOUNTER — Inpatient Hospital Stay
Admit: 2022-11-21 | Discharge: 2022-11-21 | Payer: BLUE CROSS/BLUE SHIELD | Primary: Student in an Organized Health Care Education/Training Program

## 2022-11-21 DIAGNOSIS — D571 Sickle-cell disease without crisis: Secondary | ICD-10-CM

## 2022-11-21 LAB — CBC WITH AUTO DIFFERENTIAL
BKR WAM ABSOLUTE NRBC (2 DEC): 0.17 x 1000/ÂµL (ref 0.00–1.00)
BKR WAM HEMATOCRIT (2 DEC): 20.5 % — ABNORMAL LOW (ref 38.50–50.00)
BKR WAM HEMOGLOBIN: 7.4 g/dL — ABNORMAL LOW (ref 13.2–17.1)
BKR WAM MCH (PG): 33.5 pg — ABNORMAL HIGH (ref 27.0–33.0)
BKR WAM MCHC: 36.1 g/dL — ABNORMAL HIGH (ref 31.0–36.0)
BKR WAM MCV: 92.8 fL (ref 80.0–100.0)
BKR WAM MPV: 8.9 fL (ref 8.0–12.0)
BKR WAM NUCLEATED RED BLOOD CELLS: 1.5 % — ABNORMAL HIGH (ref 0.0–1.0)
BKR WAM PLATELETS: 665 x1000/ÂµL — ABNORMAL HIGH (ref 150–420)
BKR WAM RDW-CV: 19.7 % — ABNORMAL HIGH (ref 11.0–15.0)
BKR WAM RED BLOOD CELL COUNT.: 2.21 M/ÂµL — ABNORMAL LOW (ref 4.00–6.00)
BKR WAM WHITE BLOOD CELL COUNT: 11 x1000/ÂµL — ABNORMAL HIGH (ref 4.0–11.0)

## 2022-11-21 LAB — MANUAL DIFFERENTIAL
BKR WAM BASOPHIL - ABS (DIFF) 2 DEC: 0.33 x 1000/ÂµL (ref 0.00–1.00)
BKR WAM BASOPHILS (DIFF): 3 % — ABNORMAL HIGH (ref 0.0–1.4)
BKR WAM EOSINOPHILS (DIFF) 2 DEC: 0.44 x 1000/ÂµL (ref 0.00–1.00)
BKR WAM EOSINOPHILS (DIFF): 4 % — ABNORMAL HIGH (ref 0.0–5.0)
BKR WAM LYMPHOCYTE - ABS (DIFF) 2 DEC: 2.09 x 1000/ÂµL (ref 0.60–3.70)
BKR WAM LYMPHOCYTES (DIFF): 19 % — ABNORMAL LOW (ref 17.0–50.0)
BKR WAM MONOCYTE - ABS (DIFF) 2 DEC: 1.43 x 1000/ÂµL — ABNORMAL HIGH (ref 0.00–1.00)
BKR WAM MONOCYTES (DIFF): 13 % — ABNORMAL HIGH (ref 4.0–12.0)
BKR WAM MYELOCYTES (DIFF) 1 DEC: 1 % — ABNORMAL HIGH (ref 0.0–0.0)
BKR WAM NEUTROPHILS (DIFF): 60 % — ABNORMAL LOW (ref 39.0–72.0)
BKR WAM NEUTROPHILS - ABS (DIFF) 2 DEC: 6.6 x 1000/ÂµL — ABNORMAL HIGH (ref 2.00–7.60)

## 2022-11-21 LAB — RETICULOCYTES
BKR WAM IRF: 45.4 % — ABNORMAL HIGH (ref 3.0–15.9)
BKR WAM RETICULOCYTE - ABS (3 DEC): 0.174 10Ë6 cells/uL — ABNORMAL HIGH (ref 0.023–0.140)
BKR WAM RETICULOCYTE COUNT PCT (1 DEC): 7.9 % — ABNORMAL HIGH (ref 0.6–2.7)
BKR WAM RETICULOCYTE HGB EQUIVALENT: 32.5 pg (ref 28.2–35.7)

## 2022-11-21 LAB — VITAMIN D, 25-HYDROXY: BKR VITAMIN D 25-HYDROXY TOTAL: 24 ng/mL — ABNORMAL HIGH (ref 0.60–3.70)
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
## 2022-11-25 ENCOUNTER — Encounter
Admit: 2022-11-25 | Payer: PRIVATE HEALTH INSURANCE | Attending: Family | Primary: Student in an Organized Health Care Education/Training Program

## 2022-11-25 ENCOUNTER — Telehealth
Admit: 2022-11-25 | Payer: PRIVATE HEALTH INSURANCE | Attending: Pediatric Hematology-Oncology | Primary: Student in an Organized Health Care Education/Training Program

## 2022-11-25 DIAGNOSIS — D571 Sickle-cell disease without crisis: Secondary | ICD-10-CM

## 2022-11-25 MED ORDER — OXYCODONE IMMEDIATE RELEASE 15 MG TABLET
15 mg | ORAL_TABLET | ORAL | 1 refills | Status: AC | PRN
Start: 2022-11-25 — End: ?

## 2022-11-25 MED ORDER — OXYCODONE ER 15 MG TABLET,CRUSH RESISTANT,EXTENDED RELEASE 12 HR
15 mg | ORAL_TABLET | Freq: Two times a day (BID) | ORAL | 1 refills | Status: AC
Start: 2022-11-25 — End: ?

## 2022-11-25 MED ORDER — IBUPROFEN 600 MG TABLET
600 mg | ORAL_TABLET | 2 refills | Status: AC
Start: 2022-11-25 — End: ?

## 2022-11-25 NOTE — Telephone Encounter
Patient calling office requesting refill on oxyCODONE (ROXICODONE) 15 mg Immediate Release tablet  and ibuprofen (ADVIL,MOTRIN) 600 mg tablet  called into [id number] - [place], [place] - [address]

## 2022-12-19 ENCOUNTER — Telehealth
Admit: 2022-12-19 | Payer: PRIVATE HEALTH INSURANCE | Attending: Family | Primary: Student in an Organized Health Care Education/Training Program

## 2022-12-19 ENCOUNTER — Encounter
Admit: 2022-12-19 | Payer: PRIVATE HEALTH INSURANCE | Attending: Family | Primary: Student in an Organized Health Care Education/Training Program

## 2022-12-19 DIAGNOSIS — D571 Sickle-cell disease without crisis: Secondary | ICD-10-CM

## 2022-12-19 MED ORDER — OXYCODONE ER 15 MG TABLET,CRUSH RESISTANT,EXTENDED RELEASE 12 HR
15 mg | ORAL_TABLET | Freq: Two times a day (BID) | ORAL | 1 refills | Status: AC
Start: 2022-12-19 — End: ?

## 2022-12-19 MED ORDER — OXYCODONE IMMEDIATE RELEASE 15 MG TABLET
15 mg | ORAL_TABLET | ORAL | 1 refills | Status: AC | PRN
Start: 2022-12-19 — End: ?

## 2022-12-19 NOTE — Telephone Encounter
Received call from: Contacts     Type Contact Phone/Fax  12/19/2022 10:00 AM EDT Phone (Incoming) Bhavik, Cabiness (Self) [phone] (M)   Name of medication: hydroxyurea, oxycodone IR & oxycodone ERRefill should be routed to [facility] [address] [place]How many pills do you have left? fewHas patient contacted pharmacy to see if they have refills? Yes

## 2022-12-20 ENCOUNTER — Encounter
Admit: 2022-12-20 | Payer: PRIVATE HEALTH INSURANCE | Attending: Family | Primary: Student in an Organized Health Care Education/Training Program

## 2022-12-20 MED ORDER — HYDROXYUREA 500 MG CAPSULE
500 mg | ORAL_CAPSULE | Freq: Every day | ORAL | 3 refills | Status: AC
Start: 2022-12-20 — End: ?

## 2023-01-11 ENCOUNTER — Telehealth
Admit: 2023-01-11 | Payer: PRIVATE HEALTH INSURANCE | Attending: Student in an Organized Health Care Education/Training Program | Primary: Student in an Organized Health Care Education/Training Program

## 2023-01-11 ENCOUNTER — Encounter
Admit: 2023-01-11 | Payer: PRIVATE HEALTH INSURANCE | Attending: Student in an Organized Health Care Education/Training Program | Primary: Student in an Organized Health Care Education/Training Program

## 2023-01-11 ENCOUNTER — Encounter
Admit: 2023-01-11 | Payer: BLUE CROSS/BLUE SHIELD | Attending: Family | Primary: Student in an Organized Health Care Education/Training Program

## 2023-01-12 ENCOUNTER — Inpatient Hospital Stay
Admit: 2023-01-12 | Discharge: 2023-01-18 | Payer: BLUE CROSS/BLUE SHIELD | Attending: Student in an Organized Health Care Education/Training Program | Admitting: Student in an Organized Health Care Education/Training Program

## 2023-01-12 ENCOUNTER — Inpatient Hospital Stay: Admit: 2023-01-12 | Payer: BLUE CROSS/BLUE SHIELD

## 2023-01-12 DIAGNOSIS — D5700 Hb-SS disease with crisis, unspecified: Secondary | ICD-10-CM

## 2023-01-12 LAB — CBC WITH AUTO DIFFERENTIAL
BKR WAM ABSOLUTE IMMATURE GRANULOCYTES.: 0.2 x 1000/ÂµL (ref 0.00–0.30)
BKR WAM ABSOLUTE LYMPHOCYTE COUNT.: 2.25 x 1000/ÂµL (ref 0.60–3.70)
BKR WAM ABSOLUTE NRBC (2 DEC): 0.48 x 1000/ÂµL (ref 0.00–1.00)
BKR WAM ANC (ABSOLUTE NEUTROPHIL COUNT): 5.67 x 1000/ÂµL (ref 2.00–7.60)
BKR WAM BASOPHIL ABSOLUTE COUNT.: 0.1 x 1000/ÂµL (ref 0.00–1.00)
BKR WAM BASOPHILS: 1.1 % (ref 0.0–1.4)
BKR WAM EOSINOPHIL ABSOLUTE COUNT.: 0.29 x 1000/ÂµL (ref 0.00–1.00)
BKR WAM EOSINOPHILS: 3.1 % (ref 0.0–5.0)
BKR WAM HEMATOCRIT (2 DEC): 20.9 % — ABNORMAL LOW (ref 38.50–50.00)
BKR WAM HEMOGLOBIN: 7.4 g/dL — ABNORMAL LOW (ref 13.2–17.1)
BKR WAM IMMATURE GRANULOCYTES: 2.1 % — ABNORMAL HIGH (ref 0.0–1.0)
BKR WAM LYMPHOCYTES: 23.7 % (ref 17.0–50.0)
BKR WAM MCH (PG): 32.3 pg (ref 27.0–33.0)
BKR WAM MCHC: 35.4 g/dL (ref 31.0–36.0)
BKR WAM MCV: 91.3 fL (ref 80.0–100.0)
BKR WAM MONOCYTE ABSOLUTE COUNT.: 0.98 x 1000/ÂµL (ref 0.00–1.00)
BKR WAM MONOCYTES: 10.3 % (ref 4.0–12.0)
BKR WAM MPV: 9 fL (ref 8.0–12.0)
BKR WAM NEUTROPHILS: 59.7 % (ref 39.0–72.0)
BKR WAM NUCLEATED RED BLOOD CELLS: 5.1 % — ABNORMAL HIGH (ref 0.0–1.0)
BKR WAM PLATELETS: 708 x1000/ÂµL — ABNORMAL HIGH (ref 150–420)
BKR WAM RDW-CV: 21 % — ABNORMAL HIGH (ref 11.0–15.0)
BKR WAM RED BLOOD CELL COUNT.: 2.29 M/ÂµL — ABNORMAL LOW (ref 4.00–6.00)
BKR WAM WHITE BLOOD CELL COUNT: 9.5 x1000/ÂµL (ref 4.0–11.0)

## 2023-01-12 LAB — COMPREHENSIVE METABOLIC PANEL
BKR A/G RATIO: 1.4 (ref 1.0–2.2)
BKR ALANINE AMINOTRANSFERASE (ALT): 38 U/L (ref 9–59)
BKR ALBUMIN: 4.4 g/dL (ref 3.6–5.1)
BKR ALKALINE PHOSPHATASE: 88 U/L (ref 9–122)
BKR ANION GAP: 12 (ref 7–17)
BKR ASPARTATE AMINOTRANSFERASE (AST): 69 U/L — ABNORMAL HIGH (ref 10–35)
BKR AST/ALT RATIO: 1.8
BKR BILIRUBIN TOTAL: 2.4 mg/dL — ABNORMAL HIGH (ref <=1.2)
BKR BLOOD UREA NITROGEN: 6 mg/dL (ref 6–20)
BKR BUN / CREAT RATIO: 8.6 (ref 8.0–23.0)
BKR CALCIUM: 9.1 mg/dL (ref 8.8–10.2)
BKR CHLORIDE: 103 mmol/L (ref 98–107)
BKR CO2: 21 mmol/L (ref 20–30)
BKR CREATININE: 0.7 mg/dL (ref 0.40–1.30)
BKR EGFR, CREATININE (CKD-EPI 2021): >60 mL/min/{1.73_m2} (ref >=60)
BKR GLOBULIN: 3.1 g/dL (ref 2.0–3.9)
BKR GLUCOSE: 109 mg/dL — ABNORMAL HIGH (ref 70–100)
BKR POTASSIUM: 4.4 mmol/L (ref 3.3–5.3)
BKR PROTEIN TOTAL: 7.5 g/dL (ref 5.9–8.3)
BKR SODIUM: 136 mmol/L (ref 136–144)

## 2023-01-12 MED ORDER — HYDROMORPHONE 1 MG/ML INJECTION SYRINGE
1 mg/mL | Freq: Once | INTRAVENOUS | Status: CP
Start: 2023-01-12 — End: ?
  Administered 2023-01-13: 01:00:00 1 mL via INTRAVENOUS

## 2023-01-12 MED ORDER — NALOXONE 0.4 MG/ML INJECTION SOLUTION
0.4 | INTRAVENOUS | Status: DC | PRN
Start: 2023-01-12 — End: 2023-01-19

## 2023-01-12 MED ORDER — FLUTICASONE PROPIONATE 50 MCG/ACTUATION NASAL SPRAY,SUSPENSION
50 mcg/actuation | Freq: Every day | NASAL | Status: DC
Start: 2023-01-12 — End: 2023-01-19

## 2023-01-12 MED ORDER — KETOROLAC 15 MG/ML INJECTION SOLUTION
15 | Freq: Once | INTRAVENOUS | Status: CP
Start: 2023-01-12 — End: ?
  Administered 2023-01-12: 21:00:00 15 mL via INTRAVENOUS

## 2023-01-12 MED ORDER — HYDROXYUREA 500 MG CAPSULE
500 mg | Freq: Every day | ORAL | Status: DC
Start: 2023-01-12 — End: 2023-01-19
  Administered 2023-01-13 – 2023-01-16 (×4): 500 mg via ORAL

## 2023-01-12 MED ORDER — LIDOCAINE 4 % TOPICAL PATCH
4 | Freq: Once | TRANSDERMAL | Status: CP
Start: 2023-01-12 — End: ?

## 2023-01-12 MED ORDER — KETOROLAC 15 MG/ML INJECTION SOLUTION
15 | Freq: Once | INTRAVENOUS | Status: CP
Start: 2023-01-12 — End: ?
  Administered 2023-01-12: 20:00:00 15 mL via INTRAVENOUS

## 2023-01-12 MED ORDER — SENNOSIDES 8.6 MG TABLET
8.6 mg | Freq: Two times a day (BID) | ORAL | Status: DC
Start: 2023-01-12 — End: 2023-01-19

## 2023-01-12 MED ORDER — POLYETHYLENE GLYCOL 3350 17 GRAM ORAL POWDER PACKET
17 gram | Freq: Two times a day (BID) | ORAL | Status: DC
Start: 2023-01-12 — End: 2023-01-19

## 2023-01-12 MED ORDER — HYDROMORPHONE (DILAUDID) PCA 1 MG/ML (50 ML) YNH PYXIS
1 | INTRAVENOUS | Status: DC
Start: 2023-01-12 — End: 2023-01-13
  Administered 2023-01-13: 02:00:00 1 mL via INTRAVENOUS

## 2023-01-12 MED ORDER — SODIUM CHLORIDE 0.9 % (FLUSH) INJECTION SYRINGE
0.9 % | Freq: Three times a day (TID) | INTRAVENOUS | Status: DC
Start: 2023-01-12 — End: 2023-01-19
  Administered 2023-01-14 – 2023-01-16 (×4): 0.9 mL via INTRAVENOUS

## 2023-01-12 MED ORDER — SODIUM CHLORIDE 0.9 % INTRAVENOUS SOLUTION
INTRAVENOUS | Status: DC
Start: 2023-01-12 — End: 2023-01-18
  Administered 2023-01-13 – 2023-01-15 (×3): via INTRAVENOUS

## 2023-01-12 MED ORDER — HYDROMORPHONE 1 MG/ML INJECTION SYRINGE
1 | INTRAVENOUS | Status: CP | PRN
Start: 2023-01-12 — End: ?
  Administered 2023-01-12 (×3): 1 mL via INTRAVENOUS

## 2023-01-12 MED ORDER — SODIUM CHLORIDE 0.9 % (FLUSH) INJECTION SYRINGE
0.9 % | INTRAVENOUS | Status: DC | PRN
Start: 2023-01-12 — End: 2023-01-19

## 2023-01-12 MED ORDER — HYDROMORPHONE 1 MG/ML INJECTION SYRINGE
1 | Freq: Once | INTRAVENOUS | Status: CP
Start: 2023-01-12 — End: ?
  Administered 2023-01-12: 22:00:00 1 mL via INTRAVENOUS

## 2023-01-12 NOTE — ED Notes
2:37 PM Sickle Cell Crisis. Pt reporting upper/lower back pain and bilateral side pain since last night. Patient reporting sudden onset of pain. + constant pain. No reports of SOB/CP/n/v/fever/chills. No sick contacts. Patient reports taking home regimen, Motrin and Oxycodone extended release with no pain relief. Patient awake A&Ox4. Past Medical History: Diagnosis Date  Aplastic crisis (HC Code) 6/6/ 2005 transfusion  Avascular necrosis of femur head, left (HC Code) (HC CODE) (HC Code)   Chronic pain   sickle cell  Conductive hearing loss 09/22/14 P.E tubes placed  Dactylitis one episode  April, 1998  GERD (gastroesophageal reflux disease)   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 Hb Electrophoresis: Hb S = 88.2%, HbF= 3.9%, Hb A2= 3.7%  FORM OF SICKLE CELL DISEASE  On hydroxyurea therapy started October 2013  Pneumococcal vaccination given 01/20/2006 and 09/28/2012  Spleen sequestration 09/01/1997  Vasoocclusive sickle cell crisis (HC Code) (HC CODE) (HC Code) Adm 01/02/2012, ER 04/08/12, 4/1-10/03/12, 7/2-01/05/13, 12/02/13-12/03/13 , March 2016 - 2 admissions  4:37 PM Patient medicated for 10/10 pain. Patient reporting bilateral Lower extremity pain1700 patient drinking water, tolerating well. 1725 patient medicated per MD order. 6:31 PM patient ambulate to bathroom, steady gait noted. Floor Handoff Telemetry: 	[]  Yes		[x]  NoCode Status:   [x]  Full		[]  DNR		[]  DNI		Other (specify):Safety Precautions: [x]  Fall Risk  []  Sitter   []  Restraints	[]  Suicidal	[]  None	Other (specify):Mentation/Orientation:	 A&O (Self, person, place, time) x 4	 Disoriented to:                    	 Special Accommodations: []  Hearing impaired   []  Blind  []  Nonverbal  []  Cognitive impairmentOxygenation Upon Admission: [x]  RA	[]  NC	[]  Venti  []  Simple Mask []  Other	Baseline O2 Status? []  Yes	[]  NoAmbulation: [x]  Independent	[]  Cane   []  Walker	[]  Wheelchair	[]  Bedbound		[]  Hemiplegic	[]  Paraplegic	[]  QuadraplegicEliminiation: [x]  Independent	[]  Commode	[]  Bedpan/Urinal  []  Straight Cath []  Foley cath			[]  Urostomy	[]  Colostomy	Other (specify):Diarrhea/Loose stool : []  1x within 24h  []  2x within 24h  []  3x within 24h  [x]  None 	C.Diff Order: 	[]  Ordered- needs to be collected             []  Collected-sent to lab             []  Resulted - Negative C.Diff             []  Resulted - Positive C.Diff[]  Not Ordered   [x]  N/ASkin Alteration: []  Pressure Injury []  Wound [x]  None []  Skin not assessedDiet: [x]  Regular/No order placed	[]  NPO		Other (specify):IV Access: [x]  PIV   []  PICC    []  Port    [] Central line    []  A-line    Other (specify)IVF/GTT Running Upon ED Departure? [x]  No	    []  Yes (specify): PCA pump, IV fluidsOutstanding Meds/Treatments/Tests:Patient Belongings:Are the belongings documented?          []  No	    []  YesIs someone taking belongings home?   [x]  No     []  Yes  Who? (specify)                                   Dennison Nancy, RN

## 2023-01-12 NOTE — Utilization Review (ED)
UM Status: Commercial - IPAdmit for sickle cell pain crisis, IV pain meds given.

## 2023-01-13 ENCOUNTER — Inpatient Hospital Stay: Admit: 2023-01-13 | Payer: BLUE CROSS/BLUE SHIELD

## 2023-01-13 DIAGNOSIS — D5700 Hb-SS disease with crisis, unspecified: Secondary | ICD-10-CM

## 2023-01-13 LAB — RESPIRATORY VIRUS PCR PANEL     (BH GH LMW Q YH)
BKR ADENOVIRUS: NEGATIVE
BKR CORONAVIRUS 229E: NEGATIVE
BKR CORONAVIRUS HKU1: NEGATIVE
BKR CORONAVIRUS NL63: NEGATIVE
BKR CORONAVIRUS OC43: NEGATIVE
BKR HUMAN METAPNEUMOVIRUS (HMPV): NEGATIVE
BKR INFLUENZA A: NEGATIVE
BKR INFLUENZA B: NEGATIVE
BKR PARAINFLUENZA VIRUS 1: NEGATIVE
BKR PARAINFLUENZA VIRUS 2: NEGATIVE
BKR PARAINFLUENZA VIRUS 3: NEGATIVE
BKR PARAINFLUENZA VIRUS 4: NEGATIVE
BKR RESPIRATORY SYNCYTIAL VIRUS: NEGATIVE
BKR RHINOVIRUS: NEGATIVE
BKR SARS-COV-2 RNA (COVID-19) (YH): NEGATIVE

## 2023-01-13 LAB — BILIRUBIN, DIRECT: BKR BILIRUBIN DIRECT: 0.4 mg/dL — ABNORMAL HIGH (ref 0.6–<=0.3)

## 2023-01-13 LAB — RETICULOCYTES
BKR WAM IRF: 45.2 % — ABNORMAL HIGH (ref 3.0–15.9)
BKR WAM RETICULOCYTE - ABS (3 DEC): 0.237 10Ë6 cells/uL — ABNORMAL HIGH (ref 0.023–0.140)
BKR WAM RETICULOCYTE COUNT PCT (1 DEC): 10.6 % — ABNORMAL HIGH (ref 0.6–2.7)
BKR WAM RETICULOCYTE HGB EQUIVALENT: 36.6 pg — ABNORMAL HIGH (ref 28.2–35.7)

## 2023-01-13 LAB — MANUAL DIFFERENTIAL
BKR WAM BASOPHIL - ABS (DIFF) 2 DEC: 0 x 1000/ÂµL (ref 0.00–1.00)
BKR WAM BASOPHILS (DIFF): 0 % (ref 0.0–1.4)
BKR WAM EOSINOPHILS (DIFF) 2 DEC: 0 x 1000/ÂµL (ref 0.00–1.00)
BKR WAM EOSINOPHILS (DIFF): 0 % (ref 0.0–5.0)
BKR WAM LYMPHOCYTE - ABS (DIFF) 2 DEC: 1.36 x 1000/ÂµL (ref 0.60–3.70)
BKR WAM LYMPHOCYTES (DIFF): 7 % — ABNORMAL LOW (ref 17.0–50.0)
BKR WAM MONOCYTE - ABS (DIFF) 2 DEC: 1.75 x 1000/ÂµL — ABNORMAL HIGH (ref 0.00–1.00)
BKR WAM MONOCYTES (DIFF): 9 % (ref 4.0–12.0)
BKR WAM NEUTROPHILS (DIFF): 84 % — ABNORMAL HIGH (ref 39.0–72.0)
BKR WAM NEUTROPHILS - ABS (DIFF) 2 DEC: 16.3 x 1000/ÂµL — ABNORMAL HIGH (ref 2.00–7.60)

## 2023-01-13 LAB — CBC WITH AUTO DIFFERENTIAL
BKR WAM ABSOLUTE NRBC (2 DEC): 1.88 x 1000/ÂµL — ABNORMAL HIGH (ref 0.00–1.00)
BKR WAM HEMATOCRIT (2 DEC): 20.5 % — ABNORMAL LOW (ref 38.50–50.00)
BKR WAM HEMOGLOBIN: 7.1 g/dL — ABNORMAL LOW (ref 13.2–17.1)
BKR WAM MCH (PG): 31.4 pg (ref 27.0–33.0)
BKR WAM MCHC: 34.6 g/dL (ref 31.0–36.0)
BKR WAM MCV: 90.7 fL (ref 80.0–100.0)
BKR WAM MPV: 8.9 fL (ref 8.0–12.0)
BKR WAM NUCLEATED RED BLOOD CELLS: 9.7 % — ABNORMAL HIGH (ref 0.0–1.0)
BKR WAM PLATELETS: 596 x1000/ÂµL — ABNORMAL HIGH (ref 150–420)
BKR WAM RDW-CV: 20.7 % — ABNORMAL HIGH (ref 11.0–15.0)
BKR WAM RED BLOOD CELL COUNT.: 2.26 M/ÂµL — ABNORMAL LOW (ref 4.00–6.00)
BKR WAM WHITE BLOOD CELL COUNT: 19.4 x1000/ÂµL — ABNORMAL HIGH (ref 4.0–11.0)

## 2023-01-13 LAB — PROCALCITONIN     (BH GH LMW Q YH): BKR PROCALCITONIN: 0.35 ng/mL — ABNORMAL HIGH

## 2023-01-13 LAB — LACTATE DEHYDROGENASE: BKR LACTATE DEHYDROGENASE: 844 U/L — ABNORMAL HIGH (ref 122–241)

## 2023-01-13 MED ORDER — ACETAMINOPHEN 325 MG TABLET
325 | Freq: Three times a day (TID) | ORAL | Status: DC
Start: 2023-01-13 — End: 2023-01-19
  Administered 2023-01-13 – 2023-01-18 (×15): 325 mg via ORAL

## 2023-01-13 MED ORDER — ENOXAPARIN 40 MG/0.4 ML SUBCUTANEOUS SYRINGE
40 | SUBCUTANEOUS | Status: DC
Start: 2023-01-13 — End: 2023-01-19

## 2023-01-13 MED ORDER — DICLOFENAC 1 % TOPICAL GEL
1 % | Freq: Three times a day (TID) | TOPICAL | Status: DC
Start: 2023-01-13 — End: 2023-01-19
  Administered 2023-01-14 – 2023-01-17 (×3): 1 % via TOPICAL

## 2023-01-13 MED ORDER — IOHEXOL 350 MG IODINE/ML INTRAVENOUS SOLUTION
350 | Freq: Once | INTRAVENOUS | Status: CP | PRN
Start: 2023-01-13 — End: ?
  Administered 2023-01-13: 21:00:00 350 mL via INTRAVENOUS

## 2023-01-13 MED ORDER — HYDROMORPHONE (DILAUDID) PCA 1 MG/ML (50 ML) YNH PYXIS
1 | INTRAVENOUS | Status: DC
Start: 2023-01-13 — End: 2023-01-13

## 2023-01-13 MED ORDER — HYDROXYZINE HCL 25 MG TABLET
25 mg | Freq: Four times a day (QID) | ORAL | Status: DC | PRN
Start: 2023-01-13 — End: 2023-01-19

## 2023-01-13 MED ORDER — HYDROMORPHONE (DILAUDID) PCA 1 MG/ML (50 ML) YNH PYXIS
1 | INTRAVENOUS | Status: DC
Start: 2023-01-13 — End: 2023-01-14
  Administered 2023-01-13: 18:00:00 1 mL via INTRAVENOUS

## 2023-01-13 MED ORDER — SODIUM CHLORIDE 0.9 % LARGE VOLUME SYRINGE FOR AUTOINJECTOR
Freq: Once | INTRAVENOUS | Status: CP | PRN
Start: 2023-01-13 — End: ?
  Administered 2023-01-13: 21:00:00 via INTRAVENOUS

## 2023-01-13 MED ORDER — KETOROLAC 15 MG/ML INJECTION SOLUTION
15 mg/mL | Freq: Four times a day (QID) | INTRAVENOUS | Status: CP
Start: 2023-01-13 — End: ?
  Administered 2023-01-13 – 2023-01-18 (×20): 15 mL via INTRAVENOUS

## 2023-01-13 MED ORDER — LIDOCAINE 4 % TOPICAL PATCH
4 | TRANSDERMAL | Status: DC
Start: 2023-01-13 — End: 2023-01-19

## 2023-01-13 MED ORDER — OXYCODONE ER 15 MG TABLET,CRUSH RESISTANT,EXTENDED RELEASE 12 HR
15 | Freq: Two times a day (BID) | ORAL | Status: DC
Start: 2023-01-13 — End: 2023-01-19
  Administered 2023-01-13 – 2023-01-18 (×11): 15 mg via ORAL

## 2023-01-13 MED ORDER — KETOROLAC 15 MG/ML INJECTION SOLUTION
15 | Freq: Four times a day (QID) | INTRAVENOUS | Status: DC | PRN
Start: 2023-01-13 — End: 2023-01-13
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
## 2023-01-13 NOTE — Plan of Care
Plan of Care Overview/ Patient Status    SOCIAL WORK NOTEPatient Name: Phillip RiceMedical Record Number: [id number] Date of Birth: Jul 25, 1997Medical Social Work Follow Up  [employer] Most Recent Value Admission Information  Document Type Progress Note (For Inpatient/ED Only) Prior psychosocial assessment has been documented within this hospitalization No (For Inpatient/ED Only) Prior psychosocial assessment has been documented within 30 days of this hospitalization No Reason for Current Social Work Involvement Advance Directives/Healthcare Representative, Support/Coping Source of Information Patient Record Reviewed Yes Level of Care Inpatient What medium(s) of communication were used with patient/family/caregiver? Face-to-Face / In-Person Psychosocial issues requiring intervention Support/coping Psychosocial interventions 30 minutes were spent face to face with Phillip Bell in the inpatient unit, along with Phillip Bell, LMSW, to explain and discuss Advance Directives and to see how he is doing. Phillip Bell reported that he is in a lot of pain and not feeling well. I listened attentively and empathetically and wished for a speedy recovery.  I will remain available and provide support. [facility] Outpatient Sickle Cell Team Specific referrals to enhance community supports (include existing and new resources) noted above Handoff Required? No Next Steps/Plan (including hand-off): No further social work intervention is indicated at this time.  Please reconsult if additional needs arise. Signature: Phillip Bell, LMSW Contact Information: [phone]
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
## 2023-01-13 NOTE — Significant Event
Over the course of the evening the patient's pain was very difficult to control. This prompted multiple up-titrations of his PCA pump. No clear alternative diagnosis exists to explain his extreme pain besides sickle cell crisis. - He may require eval by the palliative care team- Consider making toradol standingSamuel Kemper Durie MD
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
## 2023-01-13 NOTE — Plan of Care
Plan of Care Overview/ Patient Status    0700-1900Patient is A&Ox4, VSS on Ra. No complaints of n/v or SOB. C/o severe generalized pain. Utilizing dilaudid PCA and scheduled tylenol & toradol with fair effect. X-ray and [place] done. Up to toilet independently. Encouraged to T&R. Medicated as ordered. Safety maintained.

## 2023-01-13 NOTE — Other
PHARMACY-ASSISTED MEDICATION REPORTPharmacist review of the best possible medication history obtained by the pharmacy medication history technician has been performed.  I have updated the home medication list and identified the following information that may be relevant to this admission.NOTES/RECOMMENDATIONSNo recommendations       Prior to Admission Medications Medication Name Sig Taking? Patient Reported   fluticasone propionate (FLONASE) 50 mcg/actuation nasal sprayLast dose:  -- Last Medication Note: >> Raechel Chute Jan 13, 2023  8:50 AMMedHX Tech(Ashley Fredric Mare, CPHT):  Patient reports daily as neededEntered by Roma Kayser, CPHT [facility] Jan 13, 2023 4401 Use 1 spray in each nostril daily.       fluticasone propionate (FLONASE) 50 mcg/actuation nasal sprayLast dose:  --  Use 2 sprays in each nostril 2 (two) times daily.Patient not taking: Reported on 09/02/2022       hydroxyurea (HYDREA) 500 mg capsuleLast dose:  --  Take 4 capsules (2,000 mg total) by mouth daily. Yes     ibuprofen (ADVIL,MOTRIN) 600 mg tabletLast dose:  --  Up to 4 tablets daily as needed for pain. Yes     naloxone [facility]) 4 mg/actuation nasal sprayLast dose:  -- Last Medication Note: >> Raechel Chute Jan 13, 2023  8:50 AMMedHX Tech(Ashley Fredric Mare, CPHT):  Patient report on hand at [facility] by Roma Kayser, CPHT Fri Jan 13, 2023 0272 Use 1 spray in 1 nostril for suspected opioid overdose. May repeat in 2 minutes in other nostril with new device if minimal or no response.       oxyCODONE (OXYCONTIN) 15 mg 12 hr extended release tabletLast dose:  --  Take 1 tablet (15 mg total) by mouth every 12 (twelve) hours. Yes     oxyCODONE (ROXICODONE) 15 mg Immediate Release tabletLast dose:  --  Take 1 tablet (15 mg total) by mouth every 4 (four) hours as needed for pain. Yes     Prior to admission medications last reviewed by Raynelle Dick, PCT on Mon Nov 21, 2022 1507 Thank you,Disha Patel7/12/20249:52 AMPhone: MHBReviewed by Rickey Primus, PharmD, BCPS7/12/20242:29 PM

## 2023-01-13 NOTE — Plan of Care
Problem: Adult Inpatient Plan of CareGoal: Plan of Care ReviewOutcome: Initial problem identification Plan of Care Overview/ Patient Status    Admission Note Nursing Phillip Bell is a 27 y.o. male admitted with a chief complaint of sickle cell pain Patient arrived from Arrived From: EDPatient is    Vitals:  01/12/23 1406 01/12/23 1655 01/12/23 1942 01/12/23 2353 BP: 138/70 (!) 168/90 (!) 141/79 (!) 155/85 Pulse: 62 60 60 (!) 105 Resp: 18 16 18 18  Temp: (!) 96.8 ?F (36 ?C) 97.6 ?F (36.4 ?C) 97.7 ?F (36.5 ?C) 98.8 ?F (37.1 ?C) TempSrc: Temporal Oral Oral Oral SpO2: 97% 99% 98% 96% Oxygen therapy Oxygen TherapySpO2: 96 %Device (Oxygen Therapy): room airI have reviewed the patient's current medication orders.Current Facility-Administered Medications Medication Dose Route Frequency Provider Last Rate Last Admin  fluticasone propionate (FLONASE) 50 mcg/actuation nasal spray 1 spray  1 spray each nostril Daily William Hamburger, MD      hydroxyurea (HYDREA) capsule 2,000 mg  2,000 mg Oral Daily William Hamburger, MD      lidocaine 4 % topical patch 2 patch  2 patch Transdermal Once Cranston Neighbor, PA   2 patch at 01/12/23 1810  polyethylene glycol (MIRALAX) packet 17 g  17 g Oral BID William Hamburger, MD      senna Mancel Parsons) tablet 8.6 mg  1 tablet Oral BID William Hamburger, MD      sodium chloride 0.9 % flush 3 mL  3 mL IV Push Maryan Char, MD       HYDROmorphone    sodium chloride 10 mL/hr (01/12/23 2134) .I have reviewed patient valuables Belongings charted in last 7 days: Patient Valuables   Patient Valuables Flowsheet                    PATIENT VALUABLE(S)       Money Disposition At bedside/locker/closet 01/12/23 2337  Jewelry disposition At bedside/locker/closet 01/12/23 2337   Wallet Disposition At bedside/locker/closet 01/12/23 2337  Clothing Disposition At bedside/locker/closet 01/12/23 2337   Other disposition At bedside/locker/closet 01/12/23 2337  Cell phone disposition At bedside/locker/closet 01/12/23 2337          Comments:Admission process was done with this pt. Pt is A&0x4 & no skin issues.Pt's pain is managed via PCA and was changed multiple times to provide pain relief as ordered. Pt is independent and on room air. Pt refused AM blood draw due to pain and provider is notified. See flowsheets, patient education and plan of care for additional information.

## 2023-01-13 NOTE — H&P
[facility]	 Medicine History & PhysicalHistory provided by: the patientHistory limited by: no limitationsPatient presents from: HomeSubjective: Chief Complaint: Sickle cell pain crisisHPI 26yo with a history of SCD, avascular necrosis of the L femur who presents with sickle cell pain crisis. He was last admitted in May with pain and fever. MRI of the L femur was concerning for abscess at the site of his plate placement. He underwent aspiration. On the day of admission he developed severe upper and lower back pain as well as flank pain consistent with prior pain crises. He presented to the ED where he was noted to have hypertension and to be in visible distress due to pain on exam. He was given dilaudid with some mild improvement. Labs significant for hemoglobin of 7.4and elevated platelets with bilirubin to 2.4. 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 Bernita Raisin   Family History family history includes Alcohol abuse in his maternal aunt; Diabetes in his paternal grandfather; Hypertension in his paternal grandfather; Sarcoidosis in his father; Sickle cell trait in his father and mother; Thyroid disease in his mother.Social History  reports that he has never smoked. He has never used smokeless tobacco. He reports current alcohol use. He reports that he does not currently use drugs after having used the following drugs: Marijuana.Prior to Admission Medications Medications Prior to Admission Medication Sig Dispense Refill Last Dose  fluticasone propionate (FLONASE) 50 mcg/actuation nasal spray Use 1 spray in each nostril daily. 16 g 11   fluticasone propionate (FLONASE) 50 mcg/actuation nasal spray Use 2 sprays in each nostril 2 (two) times daily. (Patient not taking: Reported on 09/02/2022) 16 mL 1   hydroxyurea (HYDREA) 500 mg capsule Take 4 capsules (2,000 mg total) by mouth daily. 120 capsule 2   ibuprofen (ADVIL,MOTRIN) 600 mg tablet Up to 4 tablets daily as needed for pain. 100 tablet 1   naloxone (NARCAN) 4 mg/actuation nasal spray Use 1 spray in 1 nostril for suspected opioid overdose. May repeat in 2 minutes in other nostril with new device if minimal or no response. 2 each 0   oxyCODONE (OXYCONTIN) 15 mg 12 hr extended release tablet Take 1 tablet (15 mg total) by mouth every 12 (twelve) hours. 60 tablet 0   oxyCODONE (ROXICODONE) 15 mg Immediate Release tablet Take 1 tablet (15 mg total) by mouth every 4 (four) hours as needed for pain. 90 tablet 0   Allergies Allergies Allergen Reactions  Nickel Rash  Review of Systems: Review of Systems Constitutional:  Positive for fatigue. Eyes: Negative.  Respiratory: Negative.   Cardiovascular: Negative.  Gastrointestinal: Negative.  Endocrine:  Negative. Genitourinary: Negative.  Musculoskeletal:  Positive for arthralgias, back pain, myalgias and neck pain. Allergic/Immunologic: Negative.  Neurological: Negative.  Hematological: Negative.  Psychiatric/Behavioral: Negative.    Objective: Vitals:Last 24 hours: Temp:  [96.8 ?F (36 ?C)-97.7 ?F (36.5 ?C)] 97.7 ?F (36.5 ?C)Pulse:  [60-62] 60Resp:  [16-18] 18BP: (138-168)/(70-90) 141/79SpO2:  [97 %-99 %] 98 %Physical Exam: Physical ExamVitals and nursing note reviewed. Constitutional:     General: He is in acute distress.    Appearance: Normal appearance. HENT:    Head: Normocephalic and atraumatic.    Nose: Nose normal.    Mouth/Throat:    Mouth: Mucous membranes are moist. Eyes:    Extraocular Movements: Extraocular movements intact.    Pupils: Pupils are equal, round, and reactive to light. Cardiovascular:    Rate and Rhythm: Normal rate.    Pulses: Normal pulses.    Heart sounds: Normal heart sounds. Pulmonary:    Effort: Pulmonary effort is normal.    Breath sounds: Normal breath sounds. Abdominal:    General: Abdomen is flat.    Palpations: Abdomen is soft. Musculoskeletal:       General: Tenderness present. Normal range of motion.    Cervical back: Normal range of motion and neck supple. Skin:   General: Skin is warm.    Capillary Refill: Capillary refill takes less than 2 seconds. Neurological:    General: No focal deficit present.    Mental Status: He is alert and oriented to person, place, and time. Mental status is at baseline. Psychiatric:       Mood and Affect: Mood normal.       Behavior: Behavior normal. Labs: Na 136K 4.4Cr 0.7T Bili 2.4WBCs 9.5Hgb 7.4Plts 708Assessment: 26yo with a history of HbSS on DMT with HU who presents with sickle cell pain crisis. Plan: Sickle Cell Pain Crisis - Similar to prior presentations. No clear precipitant. At this time some improvement with IV dilaudid. PCA has been ordered and is pending. - Dilaudid PCA. - On prior admissions oxycodone has been reintroduced as the PCA has been weened- CXR- Check HGB in the AM, transfuse below 7- Heme consult in the AM- Continue hydroxyurea - Stool regimenI have discussed the patients code status:Yeswith:patientComorbidities Comorbidities present on admission: Secondary diagnoses occurring during hospitalization: Notifications: PCP: Drema Halon [phone]    I have personally notified the patient's primary care provider of this admission. No  Family was notified of this admission. YesI have personally discussed the plan with the patient and/or family. YesI have reviewed the plan of care with the bedside nurse, including an emphasis on the most important aspects of care for the next 12 hours: yesAdvance Care Planning Advance Care Planning Patient has a living will: noPatient has healthcare representative/proxy: noPlease enter code [id number] if patient answers Yes or code [id number] if patient answers No to any of the above questions and you are not completing a 30 minute or more Advanced Care Planning encounter. Electronically Signed:Reza Crymes Kemper Durie, MD 01/12/2023, 8:58 PMAddendum: None

## 2023-01-14 ENCOUNTER — Inpatient Hospital Stay: Admit: 2023-01-14 | Payer: BLUE CROSS/BLUE SHIELD

## 2023-01-14 DIAGNOSIS — D5700 Hb-SS disease with crisis, unspecified: Secondary | ICD-10-CM

## 2023-01-14 LAB — CBC WITH AUTO DIFFERENTIAL
BKR WAM ABSOLUTE IMMATURE GRANULOCYTES.: 0.19 x 1000/ÂµL (ref 0.00–0.30)
BKR WAM ABSOLUTE LYMPHOCYTE COUNT.: 1.28 x 1000/ÂµL (ref 0.60–3.70)
BKR WAM ABSOLUTE NRBC (2 DEC): 2.31 x 1000/ÂµL — ABNORMAL HIGH (ref 0.00–1.00)
BKR WAM ANC (ABSOLUTE NEUTROPHIL COUNT): 14.25 x 1000/ÂµL — ABNORMAL HIGH (ref 2.00–7.60)
BKR WAM BASOPHIL ABSOLUTE COUNT.: 0.06 x 1000/ÂµL (ref 0.00–1.00)
BKR WAM BASOPHILS: 0.4 % (ref 0.0–1.4)
BKR WAM EOSINOPHIL ABSOLUTE COUNT.: 0.05 x 1000/ÂµL (ref 0.00–1.00)
BKR WAM EOSINOPHILS: 0.3 % — ABNORMAL HIGH (ref 0.0–5.0)
BKR WAM HEMATOCRIT (2 DEC): 19.8 % — ABNORMAL LOW (ref 38.50–50.00)
BKR WAM HEMOGLOBIN: 7.2 g/dL — ABNORMAL LOW (ref 13.2–17.1)
BKR WAM IMMATURE GRANULOCYTES: 1.1 % — ABNORMAL HIGH (ref 0.0–1.0)
BKR WAM LYMPHOCYTES: 7.5 % — ABNORMAL LOW (ref 17.0–50.0)
BKR WAM MCH (PG): 33.2 pg — ABNORMAL HIGH (ref 27.0–33.0)
BKR WAM MCHC: 36.4 g/dL — ABNORMAL HIGH (ref 31.0–36.0)
BKR WAM MCV: 91.2 fL (ref 80.0–100.0)
BKR WAM MONOCYTE ABSOLUTE COUNT.: 1.14 x 1000/ÂµL — ABNORMAL HIGH (ref 0.00–1.00)
BKR WAM MONOCYTES: 6.7 % (ref 4.0–12.0)
BKR WAM MPV: 9.3 fL (ref 8.0–12.0)
BKR WAM NEUTROPHILS: 84 % — ABNORMAL HIGH (ref 39.0–72.0)
BKR WAM NUCLEATED RED BLOOD CELLS: 13.6 % — ABNORMAL HIGH (ref 0.0–1.0)
BKR WAM PLATELETS: 563 x1000/ÂµL — ABNORMAL HIGH (ref 150–420)
BKR WAM RDW-CV: 19.8 % — ABNORMAL HIGH (ref 11.0–15.0)
BKR WAM RED BLOOD CELL COUNT.: 2.17 M/ÂµL — ABNORMAL LOW (ref 4.00–6.00)
BKR WAM WHITE BLOOD CELL COUNT: 17 x1000/ÂµL — ABNORMAL HIGH (ref 4.0–11.0)

## 2023-01-14 LAB — COMPREHENSIVE METABOLIC PANEL
BKR A/G RATIO: 1.2 (ref 1.0–2.2)
BKR ALANINE AMINOTRANSFERASE (ALT): 37 U/L (ref 9–59)
BKR ALBUMIN: 4.1 g/dL (ref 3.6–5.1)
BKR ALKALINE PHOSPHATASE: 122 U/L — ABNORMAL LOW (ref 9–122)
BKR ANION GAP: 13 (ref 7–17)
BKR ASPARTATE AMINOTRANSFERASE (AST): 84 U/L — ABNORMAL HIGH (ref 10–35)
BKR AST/ALT RATIO: 2.3 % — ABNORMAL HIGH (ref 0.0–1.0)
BKR BILIRUBIN TOTAL: 5 mg/dL — ABNORMAL HIGH (ref 39.0–<=1.2)
BKR BLOOD UREA NITROGEN: 12 mg/dL (ref 6–20)
BKR BUN / CREAT RATIO: 18.2 (ref 8.0–23.0)
BKR CALCIUM: 9.3 mg/dL (ref 8.8–10.2)
BKR CHLORIDE: 98 mmol/L — ABNORMAL HIGH (ref 98–107)
BKR CO2: 22 mmol/L — ABNORMAL LOW (ref 20–30)
BKR CREATININE: 0.66 mg/dL — ABNORMAL HIGH (ref 0.40–1.30)
BKR EGFR, CREATININE (CKD-EPI 2021): >60 mL/min/{1.73_m2} (ref >=60–3.70)
BKR GLOBULIN: 3.5 g/dL (ref 2.0–3.9)
BKR GLUCOSE: 125 mg/dL — ABNORMAL HIGH (ref 70–100)
BKR POTASSIUM: 4.3 mmol/L (ref 3.3–5.3)
BKR PROTEIN TOTAL: 7.6 g/dL — ABNORMAL HIGH (ref 5.9–8.3)
BKR SODIUM: 133 mmol/L — ABNORMAL LOW (ref 136–144)

## 2023-01-14 LAB — BILIRUBIN, DIRECT: BKR BILIRUBIN DIRECT: 1.1 mg/dL — ABNORMAL HIGH (ref <=0.3)

## 2023-01-14 MED ORDER — ONDANSETRON HCL (PF) 4 MG/2 ML INJECTION SOLUTION
4 | Freq: Three times a day (TID) | INTRAVENOUS | Status: DC | PRN
Start: 2023-01-14 — End: 2023-01-19

## 2023-01-14 MED ORDER — HYDROMORPHONE 1 MG/ML INJECTION SYRINGE
1 | INTRAVENOUS | Status: DC | PRN
Start: 2023-01-14 — End: 2023-01-14
  Administered 2023-01-14: 07:00:00 1 mL via INTRAVENOUS

## 2023-01-14 MED ORDER — HYDROMORPHONE (DILAUDID) PCA 1 MG/ML (50 ML) YNH PYXIS
1 mg/ml | INTRAVENOUS | Status: DC
Start: 2023-01-14 — End: 2023-01-18
  Administered 2023-01-14 – 2023-01-18 (×8): 1 mL via INTRAVENOUS

## 2023-01-14 MED ORDER — HYDROMORPHONE 1 MG/ML INJECTION SYRINGE
1 | Freq: Once | INTRAVENOUS | Status: CP
Start: 2023-01-14 — End: ?
  Administered 2023-01-14: 15:00:00 1 mL via INTRAVENOUS

## 2023-01-14 MED ORDER — HYDROMORPHONE (DILAUDID) PCA 1 MG/ML (50 ML) YNH PYXIS
1 | INTRAVENOUS | Status: DC
Start: 2023-01-14 — End: 2023-01-14
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
## 2023-01-14 NOTE — Plan of Care
Plan of Care Overview/ Patient Status    0700-1900Patient is A&Ox4, VSS on Ra. No complaints of n/v or SOB. C/o severe generalized pain to lower back and legs. Utilizing dilaudid PCA and scheduled tylenol & toradol with fair effect. Encouraged to set alarm for PCA. Up to toilet independently. Encouraged to T&R. Medicated as ordered. Pending echo. Safety maintained

## 2023-01-14 NOTE — Plan of Care
Problem: Adult Inpatient Plan of CareGoal: Plan of Care ReviewOutcome: Interventions implemented as appropriateGoal: Patient-Specific Goal (Individualized)Outcome: Interventions implemented as appropriateGoal: Absence of Hospital-Acquired Illness or InjuryOutcome: Interventions implemented as appropriateGoal: Optimal Comfort and WellbeingOutcome: Interventions implemented as appropriate Problem: Sickle Cell DiseaseGoal: Optimal Cerebral Tissue PerfusionOutcome: Interventions implemented as appropriateGoal: Optimal Coping with Sickle Cell DiseaseOutcome: Interventions implemented as appropriateGoal: Effective Tissue PerfusionOutcome: Interventions implemented as appropriateGoal: Absence of Infection Signs and SymptomsOutcome: Interventions implemented as appropriateGoal: Optimal Pain Control and FunctionOutcome: Interventions implemented as appropriateGoal: Optimal OxygenationOutcome: Interventions implemented as appropriate Plan of Care Overview/ Patient Status    .Neuro: A & Ox4Cardiac:WNLRespiratory:WNL on RAGI:Regular diet, tolerating well. GU: Voiding spontaneously. Skin:Intact. Mobility:Independent. Patient encouraged to turn & reposition Q2h.IVF/ABX: Dilaudid PCA 1/10/6. N @ 10.VS: VSS.Pain: See MAR.

## 2023-01-15 ENCOUNTER — Inpatient Hospital Stay: Admit: 2023-01-15 | Payer: BLUE CROSS/BLUE SHIELD

## 2023-01-15 DIAGNOSIS — D5700 Hb-SS disease with crisis, unspecified: Secondary | ICD-10-CM

## 2023-01-15 LAB — COMPREHENSIVE METABOLIC PANEL
BKR A/G RATIO: 1 % (ref 1.0–2.2)
BKR ALANINE AMINOTRANSFERASE (ALT): 28 U/L — ABNORMAL LOW (ref 9–59)
BKR ALBUMIN: 3.9 g/dL — ABNORMAL HIGH (ref 3.6–5.1)
BKR ALKALINE PHOSPHATASE: 129 U/L — ABNORMAL HIGH (ref 9–122)
BKR ANION GAP: 11 M/ÂµL — ABNORMAL LOW (ref 7–17)
BKR ASPARTATE AMINOTRANSFERASE (AST): 53 U/L — ABNORMAL HIGH (ref 10–35)
BKR AST/ALT RATIO: 1.9 % (ref 0.0–1.0)
BKR BILIRUBIN TOTAL: 3.2 mg/dL — ABNORMAL HIGH (ref 8.0–<=1.2)
BKR BLOOD UREA NITROGEN: 14 mg/dL — ABNORMAL LOW (ref 6–20)
BKR BUN / CREAT RATIO: 20.6 g/dL (ref 8.0–23.0)
BKR CALCIUM: 9.3 mg/dL (ref 8.8–10.2)
BKR CHLORIDE: 100 mmol/L (ref 98–107)
BKR CO2: 24 mmol/L — ABNORMAL HIGH (ref 20–30)
BKR CREATININE: 0.68 mg/dL (ref 0.40–1.30)
BKR EGFR, CREATININE (CKD-EPI 2021): >60 mL/min/{1.73_m2} — ABNORMAL HIGH (ref >=60–1.0)
BKR GLOBULIN: 3.8 g/dL (ref 2.0–3.9)
BKR GLUCOSE: 140 mg/dL — ABNORMAL HIGH (ref 70–100)
BKR POTASSIUM: 4.3 mmol/L (ref 3.3–5.3)
BKR PROTEIN TOTAL: 7.7 g/dL — ABNORMAL HIGH (ref 5.9–8.3)
BKR SODIUM: 135 mmol/L — ABNORMAL LOW (ref 136–144)

## 2023-01-15 LAB — CBC WITH AUTO DIFFERENTIAL
BKR WAM ABSOLUTE IMMATURE GRANULOCYTES.: 0.15 x 1000/ÂµL (ref 0.00–0.30)
BKR WAM ABSOLUTE LYMPHOCYTE COUNT.: 1.29 x 1000/ÂµL (ref 0.60–3.70)
BKR WAM ABSOLUTE NRBC (2 DEC): 0.86 x 1000/ÂµL (ref 0.00–1.00)
BKR WAM ANC (ABSOLUTE NEUTROPHIL COUNT): 13.18 x 1000/ÂµL — ABNORMAL HIGH (ref 2.00–7.60)
BKR WAM BASOPHIL ABSOLUTE COUNT.: 0.04 x 1000/ÂµL (ref 0.00–1.00)
BKR WAM BASOPHILS: 0.2 % (ref 0.0–1.4)
BKR WAM EOSINOPHIL ABSOLUTE COUNT.: 0.11 x 1000/ÂµL (ref 0.00–1.00)
BKR WAM EOSINOPHILS: 0.7 % (ref 0.0–5.0)
BKR WAM HEMATOCRIT (2 DEC): 17.8 % — ABNORMAL LOW (ref 38.50–50.00)
BKR WAM HEMOGLOBIN: 6.3 g/dL — CL (ref 13.2–17.1)
BKR WAM IMMATURE GRANULOCYTES: 0.9 % (ref 0.0–1.0)
BKR WAM LYMPHOCYTES: 8 % — ABNORMAL LOW (ref 17.0–50.0)
BKR WAM MCH (PG): 32.3 pg (ref 27.0–33.0)
BKR WAM MCHC: 35.4 g/dL (ref 31.0–36.0)
BKR WAM MCV: 91.3 fL (ref 80.0–100.0)
BKR WAM MONOCYTE ABSOLUTE COUNT.: 1.33 x 1000/ÂµL — ABNORMAL HIGH (ref 0.00–1.00)
BKR WAM MONOCYTES: 8.3 % (ref 4.0–12.0)
BKR WAM MPV: 9.2 fL (ref 8.0–12.0)
BKR WAM NEUTROPHILS: 81.9 % — ABNORMAL HIGH (ref 39.0–72.0)
BKR WAM NUCLEATED RED BLOOD CELLS: 5.3 % — ABNORMAL HIGH (ref 0.0–1.0)
BKR WAM PLATELETS: 525 x1000/ÂµL — ABNORMAL HIGH (ref 150–420)
BKR WAM RDW-CV: 18.5 % — ABNORMAL HIGH (ref 11.0–15.0)
BKR WAM RED BLOOD CELL COUNT.: 1.95 M/ÂµL — ABNORMAL LOW (ref 4.00–6.00)
BKR WAM WHITE BLOOD CELL COUNT: 16.1 x1000/ÂµL — ABNORMAL HIGH (ref 4.0–11.0)

## 2023-01-15 LAB — RETICULOCYTES
BKR WAM IRF: 39.1 % — ABNORMAL HIGH (ref 3.0–15.9)
BKR WAM RETICULOCYTE - ABS (3 DEC): 0.228 10Ë6 cells/uL — ABNORMAL HIGH (ref 0.023–0.140)
BKR WAM RETICULOCYTE COUNT PCT (1 DEC): 11.9 % — ABNORMAL HIGH (ref 0.6–2.7)
BKR WAM RETICULOCYTE HGB EQUIVALENT: 28.8 pg (ref 28.2–35.7)

## 2023-01-15 LAB — LACTATE DEHYDROGENASE: BKR LACTATE DEHYDROGENASE: 784 U/L — ABNORMAL HIGH (ref 122–241)

## 2023-01-15 MED ORDER — LACTULOSE 20 GRAM/30 ML ORAL SOLUTION
20 | Freq: Three times a day (TID) | ORAL | Status: DC
Start: 2023-01-15 — End: 2023-01-19

## 2023-01-15 MED ORDER — OXYCODONE IMMEDIATE RELEASE 15 MG TABLET
15 | Freq: Three times a day (TID) | ORAL | Status: DC | PRN
Start: 2023-01-15 — End: 2023-01-18
  Administered 2023-01-15: 17:00:00 15 mg via ORAL

## 2023-01-15 MED ORDER — SODIUM CHLORIDE 0.9 % BOLUS (NEW BAG)
0.9 | Freq: Once | INTRAVENOUS | Status: CP
Start: 2023-01-15 — End: ?
  Administered 2023-01-15: 13:00:00 0.9 mL/h via INTRAVENOUS

## 2023-01-15 NOTE — Plan of Care
Plan of Care Overview/ Patient Status    1900-0700 Pt AOx4 calm and cooperative with care. On RA. VSS, except tachy at times HR in the 110-120 covering provider made aware. Denies any CP or SOB. Pt with c/o L upper arm and R lower leg pain. Scheduled Tylenol and IVP Toradol given providing little relief. PCA Dilaudid continued overnight as ordered, patient encouraged utilization of PCA for pain control. Poor PO intake, adequate fluid hydration promoted. No reported BM overnight. Utilizing urinal at bedside to void, urine noted to be concentrated yellow. OOB Independently. Hourly rounding. Safety maintained. Pt Girlfriend at bedside during shift change attentive and supportive. 0530- Pt noted to be moaning/groaning in more apparent pain this AM. Reporting 10/10 pain to R lower extremity. Covering provider made aware and at the bedside to assess patient. No new orders placed. Per covering patient encouraged to use PCA pump as ordered. Heats packs provided but per patient currently not helpful at the moment.
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
## 2023-01-15 NOTE — ED Provider Notes
Chief Complaint Patient presents with  Sickle Cell Pain   Patient came in for sickle cell pain. (+) back and side pain. Patient took home meds with no relief. HPI/PE:Patient is a 27 y.o. male w/ PMHx of sickle cell anemia w/ crisis hx, acute chest syndrome, aplastic crisis, spleen sequestration, AVN, sepsis presenting to the ED for sickle cell pain.  Patient reporting low back pain and left anterior arm pain.  Pain started last night but intensified greatly this morning.  Patient reports this pain is similar to prior sickle cell pain.  Denies injury/trauma.  Denies chest pain, SOB, cough, abdominal pain, leg pain, or other pain/concerns.Differential diagnosis:Sickle cell pain crisis, nonspecific inflammationPlan:- Sickle cell pain management: Toradol, Dilaudid- CBC, BMP, LFTsMDM/ED Course:BP at 168/90, otherwise vitals WNL.  Patient visibly in distress due to pain, otherwise well-appearing.  Patient presenting for sickle cell pain of right arm and low back.  Per my exam, no point tenderness of either area.  No chest pain or SOB.  Since more localized extremity pain, we will defer fuller workup.  We will evaluate with basic labs and treat via sickle cell pain plan.  Toradol and Dilaudid initially given.Bilirubin elevated at 2.4.  Hemoglobin at 7.4.  Platelets elevated at 708.  AST at 69.  All consistent with baseline.  Upon re-evaluation of patient after numerous doses of Dilaudid and additional dose of Toradol, still having pain and complaining of pain extending to lower extremities. Despite full sickle cell plan being implemented, patient still with pain. Will admit for sickle cell crisis. PCA ordered per plan. Patient signed out to Noelle Penner, PA-C at 7PM.Disposition: AdmitCase discussed with Dr. Alroy Bailiff and Dr. Tiney Rouge, PA-CEmergency DepartmentAvailable on [facility]  Physical ExamED Triage Vitals [01/12/23 1406]BP: 138/70Pulse: 62Pulse from  O2 sat: n/aResp: 18Temp: (!) 96.8 ?F (36 ?C)Temp src: TemporalSpO2: 97 % BP 138/84  - Pulse (!) 120  - Temp 99.4 ?F (37.4 ?C) (Oral)  - Resp 18  - SpO2 94% Physical ExamVitals and nursing note reviewed. Constitutional:     Appearance: Normal appearance. HENT:    Head: Normocephalic. Cardiovascular:    Rate and Rhythm: Normal rate and regular rhythm. Pulmonary:    Effort: Pulmonary effort is normal.    Breath sounds: Normal breath sounds. Abdominal:    General: Abdomen is flat.    Palpations: Abdomen is soft. Musculoskeletal:       General: No tenderness. Normal range of motion. Skin:   General: Skin is warm and dry. Neurological:    General: No focal deficit present.    Mental Status: He is alert and oriented to person, place, and time.  ProceduresAttestation/Critical CarePatient Reevaluation: ED Attestation: PA/APRNFace to face evaluation was performed by me in collaboration with the Advanced Practice Provider to assess for significant health threats. I provided a substantive portion of the care of this patient.  I personally performed medically appropriate history and physical exam and the MDM: On my exam:My differential includes:Additional acute and/or chronic problems addressed:Mallori Araque Karie Kirks is a 27 year old male with history of sickle cell disease who has presenting with the acute pain crisis.  Patient is feeling symptoms in his low back which is typical for with prior pain crises.  He denies any chest pain he has not had any new cough or shortness of breath.  Patient denies any fever.We will check labs including CBC and reticulocyte count as well as follow the pain management plan by this patient's hematologist.  I do not think patient needs a chest x-ray  on this particular visit as I have no concern for an acute chest syndrome given his current presentation.Yaretzy Olazabal-------------------------------Assumed care of above patient s/p 3rd dose of IV dilaudid for VOC, likely pending admissionKathryn Minerva Areola, MDComments as of 01/15/23 0831 Thu Jan 12, 2023 1828 Pt signed out to me pending admission. Here for sickle cell pain. PCA ordered. Noelle Penner PA-C [KF]  Comments User Index[KF] Noelle Penner, [place]   Clinical Impressions as of 01/15/23 0831 Sickle cell pain crisis [facility] Code) [facility] CODE) [facility] Code)  ED DispositionAdmit Laurian Brim, MD07/11/24 1728 Cranston Neighbor, PA07/11/24 1759 [place]ston Neighbor, PA07/11/24 1832 Noelle Penner, PA07/11/24 2204 Nicoletta Dress, MD07/14/24 [phone]

## 2023-01-15 NOTE — Plan of Care
Plan of Care Overview/ Patient Status    0700-1900Oriented x4. Sleeping between care, but opens eyes spontaneously. PIV right arm, infusion PCA and NS. Complains of 9/10 pain this shift, managing with prn pain medication and PCA pump. Skin c/d/I. OOB independent in room. Continent x2, LBM 7/12. Pt refused bowel regimen, provider Clarene Duke notified. Able to make needs know. Bed locked in lowest position. Call bell within reach. Pt off unit for echo this shift.Problem: Adult Inpatient Plan of CareGoal: Plan of Care ReviewOutcome: Interventions implemented as appropriateGoal: Patient-Specific Goal (Individualized)Outcome: Interventions implemented as appropriateGoal: Absence of Hospital-Acquired Illness or InjuryOutcome: Interventions implemented as appropriateGoal: Optimal Comfort and WellbeingOutcome: Interventions implemented as appropriate Problem: Sickle Cell DiseaseGoal: Optimal Cerebral Tissue PerfusionOutcome: Interventions implemented as appropriateGoal: Optimal Coping with Sickle Cell DiseaseOutcome: Interventions implemented as appropriateGoal: Effective Tissue PerfusionOutcome: Interventions implemented as appropriateGoal: Absence of Infection Signs and SymptomsOutcome: Interventions implemented as appropriateGoal: Optimal Pain Control and FunctionOutcome: Interventions implemented as appropriateGoal: Optimal OxygenationOutcome: Interventions implemented as appropriate Problem: Fall Injury RiskGoal: Absence of Fall and Fall-Related InjuryOutcome: Interventions implemented as appropriate

## 2023-01-16 ENCOUNTER — Encounter
Admit: 2023-01-16 | Payer: PRIVATE HEALTH INSURANCE | Attending: Critical Care Medicine | Primary: Student in an Organized Health Care Education/Training Program

## 2023-01-16 ENCOUNTER — Inpatient Hospital Stay: Admit: 2023-01-16 | Payer: BLUE CROSS/BLUE SHIELD

## 2023-01-16 DIAGNOSIS — D5700 Hb-SS disease with crisis, unspecified: Secondary | ICD-10-CM

## 2023-01-16 DIAGNOSIS — R0602 Shortness of breath: Secondary | ICD-10-CM

## 2023-01-16 LAB — CBC WITH AUTO DIFFERENTIAL
BKR WAM ABSOLUTE IMMATURE GRANULOCYTES.: 0.09 x 1000/ÂµL (ref 0.00–0.30)
BKR WAM ABSOLUTE LYMPHOCYTE COUNT.: 1.28 x 1000/ÂµL (ref 0.60–3.70)
BKR WAM ABSOLUTE NRBC (2 DEC): 0.57 x 1000/ÂµL (ref 0.00–1.00)
BKR WAM ANC (ABSOLUTE NEUTROPHIL COUNT): 13.04 x 1000/ÂµL — ABNORMAL HIGH (ref 2.00–7.60)
BKR WAM BASOPHIL ABSOLUTE COUNT.: 0.03 x 1000/ÂµL (ref 0.00–1.00)
BKR WAM BASOPHILS: 0.2 % (ref 0.0–1.4)
BKR WAM EOSINOPHIL ABSOLUTE COUNT.: 0.09 x 1000/ÂµL (ref 0.00–1.00)
BKR WAM EOSINOPHILS: 0.6 % (ref 0.0–5.0)
BKR WAM HEMATOCRIT (2 DEC): 16.5 % — ABNORMAL LOW (ref 38.50–50.00)
BKR WAM HEMOGLOBIN: 5.8 g/dL — CL (ref 13.2–17.1)
BKR WAM IMMATURE GRANULOCYTES: 0.6 % (ref 0.0–1.0)
BKR WAM LYMPHOCYTES: 8.1 % — ABNORMAL LOW (ref 17.0–50.0)
BKR WAM MCH (PG): 32.2 pg (ref 27.0–33.0)
BKR WAM MCHC: 35.2 g/dL (ref 31.0–36.0)
BKR WAM MCV: 91.7 fL (ref 80.0–100.0)
BKR WAM MONOCYTE ABSOLUTE COUNT.: 1.27 x 1000/ÂµL — ABNORMAL HIGH (ref 0.00–1.00)
BKR WAM MONOCYTES: 8 % — ABNORMAL HIGH (ref 4.0–12.0)
BKR WAM MPV: 9.2 fL (ref 8.0–12.0)
BKR WAM NEUTROPHILS: 82.5 % — ABNORMAL HIGH (ref 39.0–72.0)
BKR WAM NUCLEATED RED BLOOD CELLS: 3.6 % — ABNORMAL HIGH (ref 0.0–1.0)
BKR WAM PLATELETS: 490 x1000/ÂµL — ABNORMAL HIGH (ref 150–420)
BKR WAM RDW-CV: 18.6 % — ABNORMAL HIGH (ref 11.0–15.0)
BKR WAM RED BLOOD CELL COUNT.: 1.8 M/ÂµL — ABNORMAL LOW (ref 4.00–6.00)
BKR WAM WHITE BLOOD CELL COUNT: 15.8 x1000/ÂµL — ABNORMAL HIGH (ref 4.0–11.0)

## 2023-01-16 LAB — LACTATE DEHYDROGENASE: BKR LACTATE DEHYDROGENASE: 680 U/L — ABNORMAL HIGH (ref 122–241)

## 2023-01-16 LAB — COMPREHENSIVE METABOLIC PANEL
BKR A/G RATIO: 1 (ref 1.0–2.2)
BKR ALANINE AMINOTRANSFERASE (ALT): 24 U/L (ref 9–59)
BKR ALBUMIN: 3.6 g/dL (ref 3.6–5.1)
BKR ALKALINE PHOSPHATASE: 141 U/L — ABNORMAL HIGH (ref 9–122)
BKR ANION GAP: 11 (ref 7–17)
BKR ASPARTATE AMINOTRANSFERASE (AST): 47 U/L — ABNORMAL HIGH (ref 10–35)
BKR AST/ALT RATIO: 2 % (ref 0.0–1.0)
BKR BILIRUBIN TOTAL: 2.8 mg/dL — ABNORMAL HIGH (ref 8.0–<=1.2)
BKR BLOOD UREA NITROGEN: 11 mg/dL (ref 6–20)
BKR BUN / CREAT RATIO: 16.7 (ref 8.0–23.0)
BKR CALCIUM: 8.9 mg/dL (ref 8.8–10.2)
BKR CHLORIDE: 99 mmol/L (ref 98–107)
BKR CO2: 24 mmol/L (ref 20–30)
BKR CREATININE: 0.66 mg/dL (ref 0.40–1.30)
BKR EGFR, CREATININE (CKD-EPI 2021): >60 mL/min/{1.73_m2} — ABNORMAL HIGH (ref >=60–1.0)
BKR GLOBULIN: 3.7 g/dL (ref 2.0–3.9)
BKR GLUCOSE: 139 mg/dL — ABNORMAL HIGH (ref 70–100)
BKR POTASSIUM: 4.1 mmol/L (ref 3.3–5.3)
BKR PROTEIN TOTAL: 7.3 g/dL (ref 5.9–8.3)
BKR SODIUM: 134 mmol/L — ABNORMAL LOW (ref 136–144)

## 2023-01-16 MED ORDER — HYDROMORPHONE 1 MG/ML INJECTION SYRINGE
1 | Freq: Once | INTRAVENOUS | Status: DC
Start: 2023-01-16 — End: 2023-01-16

## 2023-01-16 MED ORDER — DOXYCYCLINE TAB/CAP 100 MG (WRAPPED E-RX)
100 | Freq: Two times a day (BID) | ORAL | Status: DC
Start: 2023-01-16 — End: 2023-01-19
  Administered 2023-01-16 – 2023-01-18 (×6): 100 mg via ORAL

## 2023-01-16 MED ORDER — HYDROMORPHONE 1 MG/ML INJECTION SYRINGE
1 | INTRAVENOUS | Status: AC | PRN
Start: 2023-01-16 — End: ?

## 2023-01-16 MED ORDER — BOLUS IV FLUID
Freq: Once | INTRAVENOUS | Status: CP
Start: 2023-01-16 — End: ?
  Administered 2023-01-16: 05:00:00 1000.000 mL/h via INTRAVENOUS

## 2023-01-16 MED ORDER — HYDROMORPHONE 1 MG/ML INJECTION SYRINGE
1 | INTRAVENOUS | Status: DC | PRN
Start: 2023-01-16 — End: 2023-01-16

## 2023-01-16 MED ORDER — CEFTRIAXONE IV PUSH 1000 MG VIAL & NS (ADULTS)
INTRAVENOUS | Status: DC
Start: 2023-01-16 — End: 2023-01-19
  Administered 2023-01-16 – 2023-01-18 (×3): 10.000 mL via INTRAVENOUS

## 2023-01-16 MED ORDER — SODIUM CHLORIDE 0.9 % INTRAVENOUS SOLUTION
INTRAVENOUS | Status: DC
Start: 2023-01-16 — End: 2023-01-18
  Administered 2023-01-16 – 2023-01-17 (×3): via INTRAVENOUS

## 2023-01-16 NOTE — Plan of Care
Problem: Adult Inpatient Plan of CareGoal: Readiness for Transition of CareOutcome: Interventions implemented as appropriate Plan of Care Overview/ Patient Status    27yo with a history of SCD, avascular necrosis of the L femur who presents with sickle cell pain crisis. He was last admitted in May with pain and fever. MRI of the L femur was concerning for abscess at the site of his plate placement. He underwent aspiration. On the day of admission he developed severe upper and lower back pain as well as flank pain consistent with prior pain crises. He presented to the ED where he was noted to have hypertension and to be in visible distress due to pain on exam. He was given dilaudid with some mild improvement. Labs significant for hemoglobin of 7.4and elevated platelets with bilirubin to 2.4. Case Management Screening  Flowsheet Row Most Recent Value Case Management Screening: Chart review completed. If YES to any question below then proceed to CM Eval/Plan  Is there a change in their cognitive function No Do you anticipate that the pt will have any discharge needs requiring CM intervention? No Has there been a readmission within the last 30 days and/or four (4) encounters (encounters include: ED, OBS, Inpatient) within the last six (6) months? No Were there services prior to admission ( Examples: Assisted Living, HD, Homecare, Extended Care Facility, Methadone, SNF, Outpatient Infusion Center) No Case Manager Attestation  I have reviewed the medical record and completed the above screen. CM staff will follow patient's progress and discuss the plan of care with the Treatment Team. Yes  Per chart review, CM screening completed. CM will continue to follow patient's progress and discuss plan of care with treatment team. Discharge needs not determined at this time. Donald Pore RN, BSN, CMMobile heartbeat  [phone]

## 2023-01-16 NOTE — Progress Notes
Phillip Bell Progress NoteSubjective Diagnosis: HbSSComplications: AVN left femoral head, aplastic crisis, multiple episodes of ACSCurrent therapy: Hydroxyurea 2g QDInterim Events:Febrile overnight; cultures and CXR obtained and started on antibiotics. pRBCs given for hemoglobin 5.8. This morning, he reports significant ongoing pain in his L elbow primarily, but also in R leg. He is anxious to leave the hospital and was wondering about the steps needed prior to discharge. Used PCA x2 during rounds; 56 attempts/36 doses from 1am-10am. Objective:  Meds:Tylenol q8hDiclofenacToradol 15 mg q6hLidocaine patchesOxycontin 15 mg q12hMiralax, senna, lactuloseCTX/doxyLovenox ppxDilaudid PCA: 1 mg, 10 min lockout, hourly max 6 mgPRN: hydroxyzine, narcan, zofranHome meds on hold: hydroxyurea (for anemia), IR oxycodoneVitals:Temp:  [97.9 ?F (36.6 ?C)-101.4 ?F (38.6 ?C)] 98.5 ?F (36.9 ?C)Pulse:  [105-133] 105Resp:  [16-20] 16BP: (117-138)/(74-89) 117/74SpO2:  [93 %-98 %] 96 %Device (Oxygen Therapy): room airO2 Flow (L/min):  [1] 1  Ins/ Outs:Intake/Output Summary (Last 24 hours) at 01/16/2023 1211Last data filed at 01/16/2023 1023Gross per 24 hour Intake 443.58 ml Output 1375 ml Net -931.42 ml Physical Exam:General -  comfortable appearing, intermittently closing eyes but participating in conversationHEENT - PERRL, EOMICV - Regular rate and rhythm. No murmurs. No JVD.Pulm - Clear to auscultation bilaterally, no crackles or wheezeAbd - Soft, nontender, nondistended. BS+Ext - wwp, no peripheral edema. No LUE swelling. Neuro/Psych: A&Ox3. No focal deficit. Laboratory Data / Imaging:  Recent Labs Lab 07/13/240535 07/14/241307 07/15/240144 WBC 17.0* 16.1* 15.8* HGB 7.2* 6.3* 5.8* HCT 19.80* 17.80* 16.50* PLT 563* 525* 490*  Recent Labs Lab 07/13/240535 07/14/241307 07/15/240144 NEUTROPHILS 84.0* 81.9* 82.5*  Recent Labs Lab 07/13/240535 07/14/241307 07/15/240144 NA 133* 135* 134* K 4.3 4.3 4.1 CL 98 100 99 CO2 22 24 24  BUN 12 14 11  CREATININE 0.66 0.68 0.66 GLU 125* 140* 139* ANIONGAP 13 11 11   Recent Labs Lab 07/13/240535 07/14/241307 07/15/240144 CALCIUM 9.3 9.3 8.9  Recent Labs Lab 07/13/240535 07/14/241307 07/15/240144 ALT 37 28 24 AST 84* 53* 47* ALKPHOS 122 129* 141* BILITOT 5.0* 3.2* 2.8* BILIDIR 1.1*  --   --  PROT 7.6 7.7 7.3 ALBUMIN 4.1 3.9 3.6  No results for input(s): PTT, LABPROT, INR in the last 168 hours. Micro:Blood culture (7/15) NGTDRVP negativeImaging:CTA Chest: no PE, + bibasilar consolidationsCXR: mildly increased bibasilar consolidationsTTE: normal LV size, function, EF 56%. Mildly increased RV size with RVSP 50 mm HgAssessment and Plan:  64M with HbSS complicated by prior AVN, aplastic crisis as a child, multiple episodes of ACS who is admitted with severe back and L elbow pain with initial imaging findings concerning for AVN of left elbow. Workup has also been notable for possible pulmonary HTN without respiratory symptoms. 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 Fever may also be related to AVN.- f/u cultures- CTX/doxy as above#Concern for pulm HTNNoted on CXR with dilated azygos vein. No sx.- recommend outpt f/u once at his baseline for further eval#Mild HyponatremiaLikely related to  poor PO intake during acute illness.   Diet RegularVTE PPx: LovenoxMedication Reconciliation: Completed: by Provider Communication: RNDischarge Readiness: Expected discharge timeframe: Expected Discharge Date: 01/18/23 Expected discharge location: Home Full CodeMedical decision making for this patient was high.I spent 50 minutes today on this encounter before, during and after the visit, reviewing labs and records, evaluating and examining the patient, entering orders and documenting the visit. -------------------------------------------------------------------------------------------------------------Signed:Mela Perham, MD Available via MHBJuly 15, 2024

## 2023-01-16 NOTE — Plan of Care
Plan of Care Overview/ Patient Status    1900-0700 Pt AOx4 calm and cooperative with care. On RA. VSS, except tachy. Denies any CP or SOB. C/O generalized pain but mainly to R leg pain 10/10. Scheduled Tylenol and IVP Toradol given. PCA Dilaudid continued overnight as ordered, patient with timer set on phone to remind him when he's able to self deliver pain med. Encouraged utilization of PCA for pain control. Poor PO intake, adequate fluid hydration promoted. Last noted BM 7/12, Abd soft non-tender. Pt refused bowel regimen meds despite education provided. Utilizing urinal at bedside to void. OOB Independently. Hourly rounding. Safety maintained. Napping between care. Current POC continues. 2351- Pt noted for a temp 100.2 and HR 133 and O2 93%. Placed on 1L NC. Denied any SOB or CP. Covering provider notified and aware, per covering recheck temp within . Repeat temp 101.4. Pt given scheduled Toradol. S/P 1L NS bolus. XR chest ordered/done, blood culture and labs drawn. Covering provider made aware of labs results. Pt ordered for 1unit of blood, transfusion started this AM and currently infusing. Report given to RN assuming care.

## 2023-01-16 NOTE — Plan of Care
Plan of Care Overview/ Patient Status    [phone]:   Patient alert, oriented.  Patient c/o 8.5/10 pain, mostly in L elbow and RLE.  Current pain med regimen appears to be adequate as patient slept most of shift.  Patient tolerating a regular diet, last BM 7/12, refusing bowel regimen.  1 unit of RBCs transfused w/o difficulties this morning.  Patient up independently in room w/a steady gait.

## 2023-01-16 NOTE — Plan of Care
Plan of Care Overview/ Patient Status    Problem: Adult Inpatient Plan of CareGoal: Readiness for Transition of CareOutcome: Interventions implemented as appropriate HPI 26yo with a history of SCD, avascular necrosis of the L femur who presents with sickle cell pain crisis.  He was last admitted in May with pain and fever. MRI of the L femur was concerning for abscess at the site of his plate placement. He underwent aspiration. On the day of admission he developed severe upper and lower back pain as well as flank pain consistent with prior pain crises. He presented to the ED where he was noted to have hypertension and to be in visible distress due to pain on exam. He was given dilaudid with some mild improvement. Labs significant for hemoglobin of 7.4and elevated platelets with bilirubin to 2.4. CM spoke with patient at bedside. Patient lives alone.  Mother for support.  Friend/mother for transport when medically cleared for dicharge. Patient ambulates independently. Patient has no VNA and declined need. Anticipate home independently at discharge and patient agreeable to plan. Case Management Screening  Flowsheet Row Most Recent Value Case Management Screening: Chart review completed. If YES to any question below then proceed to CM Eval/Plan  Is there a change in their cognitive function No Do you anticipate that the pt will have any discharge needs requiring CM intervention? No Has there been a readmission within the last 30 days and/or four (4) encounters (encounters include: ED, OBS, Inpatient) within the last six (6) months? No Were there services prior to admission ( Examples: Assisted Living, HD, Homecare, Extended Care Facility, Methadone, SNF, Outpatient Infusion Center) No Negative/Positive Screen Negative Screening: Case Management department will follow patient's progress and discuss plan of care with treatment team. Case Manager Attestation  I have reviewed the medical record and completed the above screen. CM staff will follow patient's progress and discuss the plan of care with the Treatment Team. Yes   Phillip Laine, RN, BSN CMCNCare ManagerMHB [phone]

## 2023-01-16 NOTE — Plan of Care
Plan of Care Overview/ Patient Status    SOCIAL WORK NOTEPatient Name: Phillip RiceMedical Record Number: [id number] Date of Birth: September 16, 1997Medical Social Work Follow Up  [employer] Most Recent Value Admission Information  Document Type Progress Note (For Inpatient/ED Only) Prior psychosocial assessment has been documented within this hospitalization I have reviewed and agree with the assessment of...Marland KitchenMarland KitchenMarland Kitchen Agree Prior Assessment Date 01/13/23 (For Inpatient/ED Only) Prior psychosocial assessment has been documented within 30 days of this hospitalization Yes, I have reviewed the most recent assessment and entered updates Prior Assessment Date 01/13/23 Reason for Current Social Work Involvement Support/Coping Source of Information Patient Record Reviewed Yes Level of Care Inpatient What medium(s) of communication were used with patient/family/caregiver? Face-to-Face / In-Person Psychosocial issues requiring intervention Support/coping Psychosocial interventions 15 minutes swith Mr. Hufstetler at bedside. I inquired about how he's feeling. He reports he is feeling better and wants to go home. I listened actively and provided emotional support. I explained that I would return later to check in.  I encouraged him to reach out with any concerns should they arise. I will remain available as needed. [facility] Inpatient Medical Team Specific referrals to enhance community supports (include existing and new resources) see above Handoff Required? No Next Steps/Plan (including hand-off): No social work intervention needed at this time. Signature: Ebonie-Star Raylinn Kosar. LMSW Contact Information: [phone]

## 2023-01-17 ENCOUNTER — Inpatient Hospital Stay: Admit: 2023-01-17 | Payer: BLUE CROSS/BLUE SHIELD

## 2023-01-17 LAB — CBC WITH AUTO DIFFERENTIAL
BKR WAM ABSOLUTE IMMATURE GRANULOCYTES.: 0.1 x 1000/ÂµL (ref 0.00–0.30)
BKR WAM ABSOLUTE LYMPHOCYTE COUNT.: 1.81 x 1000/ÂµL (ref 0.60–3.70)
BKR WAM ABSOLUTE NRBC (2 DEC): 0.62 x 1000/ÂµL (ref 0.00–1.00)
BKR WAM ANC (ABSOLUTE NEUTROPHIL COUNT): 9.42 x 1000/ÂµL — ABNORMAL HIGH (ref 2.00–7.60)
BKR WAM BASOPHIL ABSOLUTE COUNT.: 0.03 x 1000/ÂµL (ref 0.00–1.00)
BKR WAM BASOPHILS: 0.2 % (ref 0.0–1.4)
BKR WAM EOSINOPHIL ABSOLUTE COUNT.: 0.51 x 1000/ÂµL (ref 0.00–1.00)
BKR WAM EOSINOPHILS: 4 % (ref 0.0–5.0)
BKR WAM HEMATOCRIT (2 DEC): 18.4 % — ABNORMAL LOW (ref 38.50–50.00)
BKR WAM HEMOGLOBIN: 6.3 g/dL — CL (ref 13.2–17.1)
BKR WAM IMMATURE GRANULOCYTES: 0.8 % (ref 0.0–1.0)
BKR WAM LYMPHOCYTES: 14.1 % — ABNORMAL LOW (ref 17.0–50.0)
BKR WAM MCH (PG): 30.9 pg (ref 27.0–33.0)
BKR WAM MCHC: 34.2 g/dL (ref 31.0–36.0)
BKR WAM MCV: 90.2 fL (ref 80.0–100.0)
BKR WAM MONOCYTE ABSOLUTE COUNT.: 0.99 x 1000/ÂµL (ref 0.00–1.00)
BKR WAM MONOCYTES: 7.7 % (ref 4.0–12.0)
BKR WAM MPV: 9.3 fL (ref 8.0–12.0)
BKR WAM NEUTROPHILS: 73.2 % — ABNORMAL HIGH (ref 39.0–72.0)
BKR WAM NUCLEATED RED BLOOD CELLS: 4.8 % — ABNORMAL HIGH (ref 0.0–1.0)
BKR WAM PLATELETS: 499 x1000/ÂµL — ABNORMAL HIGH (ref 150–420)
BKR WAM RDW-CV: 19.1 % — ABNORMAL HIGH (ref 11.0–15.0)
BKR WAM RED BLOOD CELL COUNT.: 2.04 M/ÂµL — ABNORMAL LOW (ref 4.00–6.00)
BKR WAM WHITE BLOOD CELL COUNT: 12.9 x1000/ÂµL — ABNORMAL HIGH (ref 4.0–11.0)

## 2023-01-17 LAB — BASIC METABOLIC PANEL
BKR ANION GAP: 11 x1000/ÂµL — ABNORMAL HIGH (ref 7–17)
BKR BLOOD UREA NITROGEN: 9 mg/dL — ABNORMAL HIGH (ref 6–20)
BKR BUN / CREAT RATIO: 15.5 % (ref 8.0–23.0)
BKR CALCIUM: 9.1 mg/dL (ref 8.8–10.2)
BKR CHLORIDE: 100 mmol/L (ref 98–107)
BKR CO2: 23 mmol/L — ABNORMAL HIGH (ref 20–30)
BKR CREATININE: 0.58 mg/dL — ABNORMAL LOW (ref 0.40–1.30)
BKR EGFR, CREATININE (CKD-EPI 2021): >60 mL/min/{1.73_m2} (ref >=60)
BKR GLUCOSE: 124 mg/dL — ABNORMAL HIGH (ref 70–100)
BKR POTASSIUM: 4.3 mmol/L (ref 3.3–5.3)
BKR SODIUM: 134 mmol/L — ABNORMAL LOW (ref 136–144)

## 2023-01-17 MED ORDER — HYDROMORPHONE 1 MG/ML INJECTION SYRINGE
1 | INTRAVENOUS | Status: AC | PRN
Start: 2023-01-17 — End: ?
  Administered 2023-01-17: 21:00:00 1 mL via INTRAVENOUS

## 2023-01-17 MED ORDER — GADOTERATE MEGLUMINE 0.5 MMOL/ML (376.9 MG/ML) INTRAVENOUS SOLUTION
0.5 | Freq: Once | INTRAVENOUS | Status: CP | PRN
Start: 2023-01-17 — End: ?
  Administered 2023-01-17: 23:00:00 0.5 mL via INTRAVENOUS

## 2023-01-17 MED ORDER — HYDROMORPHONE 2 MG/ML INJECTION SOLUTION
2 | Status: CP
Start: 2023-01-17 — End: ?

## 2023-01-17 NOTE — Progress Notes
[place] Hospitalist Progress NoteSubjective Diagnosis: HbSSComplications: AVN left femoral head, aplastic crisis, multiple episodes of ACSCurrent therapy: Hydroxyurea 2g QDInterim Events:No acute events overnight. PCA reviewed; 8 doses/12 attempts in 4 hrs. He feels the pain is slightly better managed this morning but having increased arm swelling. Frustrated with hospital course.  Objective:  Meds:Tylenol q8hDiclofenacToradol 15 mg q6hLidocaine patchesOxycontin 15 mg q12hMiralax, senna, lactuloseCTX/doxyLovenox ppxDilaudid PCA: 1 mg, 10 min lockout, hourly max 6 mgPRN: hydroxyzine, narcan, zofranHome meds on hold: hydroxyurea (for anemia), IR oxycodoneVitals:Temp:  [98.3 ?F (36.8 ?C)-98.7 ?F (37.1 ?C)] 98.6 ?F (37 ?C)Pulse:  [93-108] 93Resp:  [18-19] 19BP: (120-145)/(72-85) 125/83SpO2:  [96 %-100 %] 98 %Device (Oxygen Therapy): room air  Ins/ Outs:Intake/Output Summary (Last 24 hours) at 01/17/2023 1431Last data filed at 01/17/2023 1400Gross per 24 hour Intake 103 ml Output -- Net 103 ml Physical Exam:General -  comfortable appearing, interactiveHEENT - PERRL, EOMICV - Regular rate and rhythm. No murmurs. No JVD.Pulm - Clear to auscultation bilaterally, no crackles or wheezeAbd - Soft, nontender, nondistended. BS+Ext - wwp, LUE swelling, no erythema or warmthNeuro/Psych: A&Ox3. No focal deficit. Laboratory Data / Imaging:  Recent Labs Lab 07/14/241307 07/15/240144 07/16/240927 WBC 16.1* 15.8* 12.9* HGB 6.3* 5.8* 6.3* HCT 17.80* 16.50* 18.40* PLT 525* 490* 499*  Recent Labs Lab 07/14/241307 07/15/240144 07/16/240927 NEUTROPHILS 81.9* 82.5* 73.2*  Recent Labs Lab 07/14/241307 07/15/240144 07/16/240927 NA 135* 134* 134* K 4.3 4.1 4.3 CL 100 99 100 CO2 24 24 23  BUN 14 11 9  CREATININE 0.68 0.66 0.58 GLU 140* 139* 124* ANIONGAP 11 11 11   Recent Labs Lab 07/14/241307 07/15/240144 07/16/240927 CALCIUM 9.3 8.9 9.1  Recent Labs Lab 07/13/240535 07/14/241307 07/15/240144 ALT 37 28 24 AST 84* 53* 47* ALKPHOS 122 129* 141* BILITOT 5.0* 3.2* 2.8* BILIDIR 1.1*  --   --  PROT 7.6 7.7 7.3 ALBUMIN 4.1 3.9 3.6  No results for input(s): PTT, LABPROT, INR in the last 168 hours. Micro:Blood culture (7/15) NGTDRVP negativeImaging:CTA Chest: no PE, + bibasilar consolidationsCXR: mildly increased bibasilar consolidationsTTE: normal LV size, function, EF 56%. 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 Workup has also been notable for possible pulmonary HTN without respiratory symptoms. 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 Could consider outpt referral if AVN confirmed and pt eager to discharge.- recommended continuing PCA for now; if pt decides he prefers discharge will re-evaluate- pt declining bowel regimen, last BM 7/12- hgb stable after pRBC 7/15#Fever, ?pneumoniaNo clear e/o ACS however given persistent bibasilar opacities reasonable to tx for pneumonia with CTX/doxy for 5d. Fever may also be related to AVN.- f/u cultures- CTX/doxy as above (5d course)#Concern for pulm HTNNoted on CXR with dilated azygos vein. No sx.- recommend outpt f/u once at his baseline for further eval#Mild HyponatremiaLikely related to poor PO intake during acute illness.   Diet RegularVTE PPx: LovenoxMedication Reconciliation: Completed: by Provider Communication: RNDischarge Readiness: Expected discharge timeframe: Expected Discharge Date: 01/19/23 Expected discharge location: Home  Full CodeMedical decision making for this patient was high.I spent 50 minutes today on this encounter before, during and after the visit, reviewing labs and records, evaluating and examining the patient, entering orders and documenting the visit. -------------------------------------------------------------------------------------------------------------Signed:Faige Seely, MD Available via MHBJuly 16, 2024

## 2023-01-17 NOTE — Plan of Care
Plan of Care Overview/ Patient Status    [phone]:   Patient is alert, oriented.  Patient continues to c/o 9/10 for which current pain med regimen appears to be adequate.  Patient able to sleep in-between care today.  Patient tolerating diet w/o difficulties, last BM 7/12.  Patient @ MRI of L elbow as of this note.  Patient ambulating in room w/o difficulties.

## 2023-01-17 NOTE — Plan of Care
Plan of Care Overview/ Patient Status    SOCIAL WORK NOTEPatient Name: Phillip RiceMedical Record Number: [id number] Date of Birth: 1997/04/26Medical Social Work Follow Up  [employer] Most Recent Value Admission Information  Document Type Progress Note (For Inpatient/ED Only) Prior psychosocial assessment has been documented within this hospitalization I have reviewed and agree with the assessment of...Marland KitchenMarland KitchenMarland Kitchen Agree Prior Assessment Date 01/13/23 (For Inpatient/ED Only) Prior psychosocial assessment has been documented within 30 days of this hospitalization Yes, I have reviewed the most recent assessment and entered updates Prior Assessment Date 01/13/23 Reason for Current Social Work Involvement Support/Coping, Self-care/decision concerns Source of Information Patient Record Reviewed Yes Level of Care Inpatient What medium(s) of communication were used with patient/family/caregiver? Face-to-Face / In-Person Psychosocial issues requiring intervention Haematologist Psychosocial interventions 30 minutes direct contact with Phillip Bell along with Phillip Bell, LMSW. The purpose of this visit was to determine if he would like to appoint a Art therapist. He is experiencing some right arm pain and swelling,  therefore he is unable to sign the documents at this time. Phillip Bell feels he can manage his pain at home and is ready for discharge. I provided emotional support and active listening. I will remain available and provide support. [facility] Inpatient Medical Team Specific referrals to enhance community supports (include existing and new resources) See above Handoff Required? No Next Steps/Plan (including hand-off): Social Work intervention complete at this time. Signature: Phillip Bell,LMSW Contact Information: [phone]

## 2023-01-17 NOTE — Plan of Care
Plan of Care Overview/ Patient Status    SOCIAL WORK NOTEPatient Name: Phillip RiceMedical Record Number: [id number] Date of Birth: 08-Oct-1997Medical Social Work Follow Up  [employer] Most Recent Value Admission Information  Document Type Progress Note (For Inpatient/ED Only) Prior psychosocial assessment has been documented within this hospitalization I have reviewed and agree with the assessment of...Marland KitchenMarland KitchenMarland Kitchen Agree Prior Assessment Date 01/13/23 (For Inpatient/ED Only) Prior psychosocial assessment has been documented within 30 days of this hospitalization Yes, I have reviewed the most recent assessment and entered updates Prior Assessment Date 01/13/23 Reason for Current Social Work Involvement Advance Directives/Healthcare Representative, Support/Coping Source of Information Patient Record Reviewed Yes Level of Care Inpatient What medium(s) of communication were used with patient/family/caregiver? Face-to-Face / In-Person Psychosocial issues requiring intervention Support/coping Psychosocial interventions 30 minutes were spent face to face with Phillip Bell in the inpatient unit, along with Eboni-Star Clemmons, LMSW, to see if he was ready to sign the healthcare representative form. When we saw him last, he was not feeling his best, and his arm was hurting,  he reports today he is still unable to use his arm.  He reported he is not feeling any better and is ready to go home and begin his healing at home. I provided emotional support and active listening.  I will remain available and provide support. Collaborations Sickle Cell Team Specific referrals to enhance community supports (include existing and new resources) see above Handoff Required? No Next Steps/Plan (including hand-off): No further social work intervention is indicated at this time.  Please reconsult if additional needs arise. Signature: Oren Bracket, LMSW Contact Information: [phone]

## 2023-01-18 ENCOUNTER — Encounter
Admit: 2023-01-18 | Payer: PRIVATE HEALTH INSURANCE | Attending: Vascular and Interventional Radiology | Primary: Student in an Organized Health Care Education/Training Program

## 2023-01-18 ENCOUNTER — Encounter
Admit: 2023-01-18 | Payer: PRIVATE HEALTH INSURANCE | Primary: Student in an Organized Health Care Education/Training Program

## 2023-01-18 DIAGNOSIS — I10 Essential (primary) hypertension: Secondary | ICD-10-CM

## 2023-01-18 DIAGNOSIS — I272 Pulmonary hypertension, unspecified: Secondary | ICD-10-CM

## 2023-01-18 DIAGNOSIS — K219 Gastro-esophageal reflux disease without esophagitis: Secondary | ICD-10-CM

## 2023-01-18 DIAGNOSIS — J9811 Atelectasis: Secondary | ICD-10-CM

## 2023-01-18 DIAGNOSIS — E871 Hypo-osmolality and hyponatremia: Secondary | ICD-10-CM

## 2023-01-18 DIAGNOSIS — K5900 Constipation, unspecified: Secondary | ICD-10-CM

## 2023-01-18 DIAGNOSIS — D5700 Hb-SS disease with crisis, unspecified: Secondary | ICD-10-CM

## 2023-01-18 LAB — CBC WITH AUTO DIFFERENTIAL
BKR WAM ABSOLUTE IMMATURE GRANULOCYTES.: 0.05 x 1000/ÂµL (ref 0.00–0.30)
BKR WAM ABSOLUTE LYMPHOCYTE COUNT.: 1.6 x 1000/ÂµL (ref 0.60–3.70)
BKR WAM ABSOLUTE NRBC (2 DEC): 0.96 x 1000/ÂµL (ref 0.00–1.00)
BKR WAM ANC (ABSOLUTE NEUTROPHIL COUNT): 5.5 x 1000/ÂµL (ref 2.00–7.60)
BKR WAM BASOPHIL ABSOLUTE COUNT.: 0.02 x 1000/ÂµL (ref 0.00–1.00)
BKR WAM BASOPHILS: 0.2 % (ref 0.0–1.4)
BKR WAM EOSINOPHIL ABSOLUTE COUNT.: 0.41 x 1000/ÂµL (ref 0.00–1.00)
BKR WAM EOSINOPHILS: 4.8 % (ref 0.0–5.0)
BKR WAM HEMATOCRIT (2 DEC): 18.1 % — ABNORMAL LOW (ref 38.50–50.00)
BKR WAM HEMOGLOBIN: 6.2 g/dL — CL (ref 13.2–17.1)
BKR WAM IMMATURE GRANULOCYTES: 0.6 % (ref 0.0–1.0)
BKR WAM LYMPHOCYTES: 18.9 % (ref 17.0–50.0)
BKR WAM MCH (PG): 31.2 pg (ref 27.0–33.0)
BKR WAM MCHC: 34.3 g/dL (ref 31.0–36.0)
BKR WAM MCV: 91 fL (ref 80.0–100.0)
BKR WAM MONOCYTE ABSOLUTE COUNT.: 0.9 x 1000/ÂµL (ref 0.00–1.00)
BKR WAM MONOCYTES: 10.6 % (ref 4.0–12.0)
BKR WAM MPV: 9.2 fL (ref 8.0–12.0)
BKR WAM NEUTROPHILS: 64.9 % (ref 39.0–72.0)
BKR WAM NUCLEATED RED BLOOD CELLS: 11.3 % — ABNORMAL HIGH (ref 0.0–1.0)
BKR WAM PLATELETS: 529 x1000/ÂµL — ABNORMAL HIGH (ref 150–420)
BKR WAM RDW-CV: 19.4 % — ABNORMAL HIGH (ref 11.0–15.0)
BKR WAM RED BLOOD CELL COUNT.: 1.99 M/ÂµL — ABNORMAL LOW (ref 4.00–6.00)
BKR WAM WHITE BLOOD CELL COUNT: 8.5 x1000/ÂµL (ref 4.0–11.0)

## 2023-01-18 LAB — SYNOVIAL FLUID, CRYSTAL
BKR CWS CALCIUM PYROPHOSPHATE CRYSTALS, FLUID: NONE SEEN
BKR CWS OTHER CRYSTALS, FLUID: NONE SEEN Cells/uL — ABNORMAL HIGH
BKR CWS URIC ACID CRYSTALS, FLUID: NONE SEEN

## 2023-01-18 LAB — BODY FLUID DIFF     (YH)
BKR CWS ANC, FLUID: 1708.7 Cells/uL — ABNORMAL HIGH
BKR CWS LYMPHS FLUID: 4 % — ABNORMAL HIGH
BKR CWS MONOCYTE/MACROPHAGE: 26 % — ABNORMAL HIGH
BKR CWS NEUTROPHILS, FLUID: 70 % — ABNORMAL HIGH

## 2023-01-18 LAB — BASIC METABOLIC PANEL
BKR ANION GAP: 10 g/dL (ref 7–17)
BKR BLOOD UREA NITROGEN: 7 mg/dL — ABNORMAL HIGH (ref 6–20)
BKR BUN / CREAT RATIO: 13.7 % (ref 8.0–23.0)
BKR CALCIUM: 8.9 mg/dL (ref 8.8–10.2)
BKR CHLORIDE: 102 mmol/L (ref 98–107)
BKR CO2: 24 mmol/L (ref 20–30)
BKR CREATININE: 0.51 mg/dL (ref 0.40–1.30)
BKR EGFR, CREATININE (CKD-EPI 2021): >60 mL/min/{1.73_m2} (ref >=60–12.0)
BKR GLUCOSE: 113 mg/dL — ABNORMAL HIGH (ref 70–100)
BKR POTASSIUM: 5 mmol/L — ABNORMAL LOW (ref 3.3–5.3)
BKR SODIUM: 136 mmol/L — CL (ref 136–144)

## 2023-01-18 LAB — BODY FLUID CELL COUNT     (BH GH LMW YH)
BKR CWS RED BLOOD CELLS, FLUID: 2000 {cells}/uL — ABNORMAL HIGH
BKR CWS TOTAL NUCLEATED CELL COUNT, FLUID: 2441 {cells}/uL — ABNORMAL HIGH

## 2023-01-18 LAB — C-REACTIVE PROTEIN     (CRP): BKR C-REACTIVE PROTEIN, HIGH SENSITIVITY: 135.6 mg/L — ABNORMAL HIGH (ref 0.0–1.0)

## 2023-01-18 LAB — SEDIMENTATION RATE (ESR): BKR SEDIMENTATION RATE, ERYTHROCYTE: 49 mm/hr — ABNORMAL HIGH (ref 0–20)

## 2023-01-18 MED ORDER — OXYCODONE ER 15 MG TABLET,CRUSH RESISTANT,EXTENDED RELEASE 12 HR
15 | ORAL_TABLET | Freq: Two times a day (BID) | ORAL | 1 refills | Status: AC
Start: 2023-01-18 — End: ?

## 2023-01-18 MED ORDER — DICLOFENAC 1 % TOPICAL GEL
1 % | Freq: Three times a day (TID) | TOPICAL | 1 refills | 17.00 days | Status: AC
Start: 2023-01-18 — End: ?
  Filled 2023-01-18: qty 100, 17d supply, fill #0

## 2023-01-18 MED ORDER — OXYCODONE IMMEDIATE RELEASE 15 MG TABLET
15 | ORAL | Status: DC | PRN
Start: 2023-01-18 — End: 2023-01-19
  Administered 2023-01-18 (×2): 15 mg via ORAL

## 2023-01-18 MED ORDER — HYDROMORPHONE (DILAUDID) PCA 1 MG/ML (50 ML) YNH PYXIS
1 | INTRAVENOUS | Status: DC
Start: 2023-01-18 — End: 2023-01-18

## 2023-01-18 MED ORDER — OXYCODONE IMMEDIATE RELEASE 15 MG TABLET
15 | ORAL_TABLET | ORAL | 1 refills | Status: AC | PRN
Start: 2023-01-18 — End: 2023-01-18

## 2023-01-18 MED ORDER — OXYCODONE ER 15 MG TABLET,CRUSH RESISTANT,EXTENDED RELEASE 12 HR
15 | ORAL_TABLET | Freq: Two times a day (BID) | ORAL | 1 refills | Status: AC
Start: 2023-01-18 — End: 2023-01-18

## 2023-01-18 MED ORDER — CEFUROXIME AXETIL 500 MG TABLET
500 mg | ORAL_TABLET | Freq: Two times a day (BID) | ORAL | 1 refills | 2.00 days | Status: AC
Start: 2023-01-18 — End: ?
  Filled 2023-01-18: qty 4, 2d supply, fill #0

## 2023-01-18 MED ORDER — DOXYCYCLINE HYCLATE 100 MG TABLET
100 mg | ORAL_TABLET | Freq: Two times a day (BID) | ORAL | 1 refills | 3.00 days | Status: AC
Start: 2023-01-18 — End: ?
  Filled 2023-01-18: qty 5, 3d supply, fill #0

## 2023-01-18 MED ORDER — OXYCODONE IMMEDIATE RELEASE 15 MG TABLET
15 | ORAL_TABLET | ORAL | 1 refills | Status: AC | PRN
Start: 2023-01-18 — End: ?

## 2023-01-18 NOTE — Discharge Instructions
HOLD your Hydrea until you see Phillip Bell in clinic on August 5th

## 2023-01-18 NOTE — Plan of Care
Problem: Adult Inpatient Plan of CareGoal: Plan of Care ReviewOutcome: Interventions implemented as appropriateGoal: Patient-Specific Goal (Individualized)Outcome: Interventions implemented as appropriateGoal: Absence of Hospital-Acquired Illness or InjuryOutcome: Interventions implemented as appropriateGoal: Optimal Comfort and WellbeingOutcome: Interventions implemented as appropriateGoal: Readiness for Transition of CareOutcome: Interventions implemented as appropriate Problem: Sickle Cell DiseaseGoal: Optimal Cerebral Tissue PerfusionOutcome: Interventions implemented as appropriateGoal: Optimal Coping with Sickle Cell DiseaseOutcome: Interventions implemented as appropriateGoal: Effective Tissue PerfusionOutcome: Interventions implemented as appropriateGoal: Absence of Infection Signs and SymptomsOutcome: Interventions implemented as appropriateGoal: Optimal Pain Control and FunctionOutcome: Interventions implemented as appropriateGoal: Optimal OxygenationOutcome: Interventions implemented as appropriate Problem: Fall Injury RiskGoal: Absence of Fall and Fall-Related InjuryOutcome: Interventions implemented as appropriate Plan of Care Overview/ Patient Status    AOX4. VSS. On RA. PCA Dilaudid. Patient pain has been high. Scheduled Toradol and Tylenol given. Pills given whole. IVF at 57ml/hr. Voiding spontaneously. Independent in bed and oob. Hourly rounding continued. Safety precautions maintained. See flowsheet for further details. 3: Refused AM Labs at this time.

## 2023-01-18 NOTE — Other
[facility]  REQUEST  DOC[facility] NOTE-Type of consult: [facility] -New Consult: [id number] [name]udge / Location: 4718/4718-A / Brief Clinical Question: 27 yo M w/ sickle cell w/ moderate L elbow effusion; needs to be drained/Callback Cell Phone: [phone]/ Use .consultnotetemplate or add .consultrecommendation to your consult notes.    Remind your attending to use .consultattestation /Please confirm receipt of this message by texting back ?OK?-2 [place] Page sent to [phone] at 10:52 AM.-Willette Mudry Hudd7/17/202410:52 [facility] [phone]

## 2023-01-18 NOTE — Plan of Care
Plan of Care Overview/ Patient Status    Phillip Bell was discharged via Private Car accompanied by Parent.  Acknowledged understanding of discharge instructionsand recommended follow up care as per the after visit summary.  Written discharge instructions provided. Denies any further questions. Vital signs    Vitals:  01/18/23 0046 01/18/23 0542 01/18/23 0817 01/18/23 1239 BP: (!) 143/83 134/77 (!) 145/83 (!) 156/87 Pulse: 85 79 81 78 Resp: 18 18 19 18  Temp: 98.8 ?F (37.1 ?C) 98.5 ?F (36.9 ?C) 98.3 ?F (36.8 ?C)  TempSrc: Oral Oral Oral  SpO2: 99% 100% 100% 99% Patient confirmed all belongings returned. Belongings charted in last 7 days: Patient Valuables   Patient Valuables Flowsheet                    PATIENT VALUABLE(S)       Money Disposition At bedside/locker/closet 01/12/23 2337  Jewelry disposition At bedside/locker/closet 01/12/23 2337   Wallet Disposition At bedside/locker/closet 01/12/23 2337  Clothing Disposition At bedside/locker/closet 01/12/23 2337   Other disposition At bedside/locker/closet 01/12/23 2337  Cell phone disposition At bedside/locker/closet 01/12/23 2337          0700-1630A&O x4. VSS on RA. PIV x2 to BUE, removed for discharge. PCA discontinued per [facility]. Complain of 6/10 pain this shift, PRN pain medication provided. Skin c/d/I. OOB independent in room. Continent x2. Able to make needs known. Bed locked in lowest position. Call bell within reach. Pt discharged with all personal belongings including meds to beds delivery of home medication. Problem: Adult Inpatient Plan of CareGoal: Plan of Care ReviewOutcome: Outcome(s) achievedGoal: Patient-Specific Goal (Individualized)Outcome: Outcome(s) achievedGoal: Absence of Hospital-Acquired Illness or InjuryOutcome: Outcome(s) achievedGoal: Optimal Comfort and WellbeingOutcome: Outcome(s) achievedGoal: Readiness for Transition of CareOutcome: Outcome(s) achieved Problem: Sickle Cell DiseaseGoal: Optimal Cerebral Tissue PerfusionOutcome: Outcome(s) achievedGoal: Optimal Coping with Sickle Cell DiseaseOutcome: Outcome(s) achievedGoal: Effective Tissue PerfusionOutcome: Outcome(s) achievedGoal: Absence of Infection Signs and SymptomsOutcome: Outcome(s) achievedGoal: Optimal Pain Control and FunctionOutcome: Outcome(s) achievedGoal: Optimal OxygenationOutcome: Outcome(s) achieved Problem: Fall Injury RiskGoal: Absence of Fall and Fall-Related InjuryOutcome: Outcome(s) achieved
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
## 2023-01-18 NOTE — Plan of Care
Problem: Adult Inpatient Plan of CareGoal: Plan of Care ReviewOutcome: Interventions implemented as appropriateGoal: Patient-Specific Goal (Individualized)Outcome: Interventions implemented as appropriateGoal: Absence of Hospital-Acquired Illness or InjuryOutcome: Interventions implemented as appropriateGoal: Optimal Comfort and WellbeingOutcome: Interventions implemented as appropriateGoal: Readiness for Transition of CareOutcome: Interventions implemented as appropriate Problem: Sickle Cell DiseaseGoal: Optimal Cerebral Tissue PerfusionOutcome: Interventions implemented as appropriateGoal: Optimal Coping with Sickle Cell DiseaseOutcome: Interventions implemented as appropriateGoal: Effective Tissue PerfusionOutcome: Interventions implemented as appropriateGoal: Absence of Infection Signs and SymptomsOutcome: Interventions implemented as appropriateGoal: Optimal Pain Control and FunctionOutcome: Interventions implemented as appropriateGoal: Optimal OxygenationOutcome: Interventions implemented as appropriate Problem: Fall Injury RiskGoal: Absence of Fall and Fall-Related InjuryOutcome: Interventions implemented as appropriate Plan of Care Overview/ Patient Status    AOX4. VSS. On RA. PCA Dilaudid. Patient pain has been high. Scheduled Toradol given. Pills given whole. IVF at 90ml/hr. Voiding spontaneously. Independent in bed and oob. Hourly rounding continued. Safety precautions maintained. See flowsheet for further details. 1607: Refused AM labs at this time. Deferred to day shift.

## 2023-01-20 ENCOUNTER — Encounter
Admit: 2023-01-20 | Payer: PRIVATE HEALTH INSURANCE | Primary: Student in an Organized Health Care Education/Training Program

## 2023-01-20 NOTE — Progress Notes
SOCIAL WORK NOTEPatient Name: Dhir RiceMedical Record Number: [id number] Date of Birth: Nov 03, 1997Medical Social Work Follow Up  [employer] Most Recent Value Admission Information  Document Type Progress Note (For Inpatient/ED Only) Prior psychosocial assessment has been documented within this hospitalization I have reviewed and agree with the assessment of...Marland KitchenMarland KitchenMarland Kitchen Agree Prior Assessment Date 01/13/23 (For Inpatient/ED Only) Prior psychosocial assessment has been documented within 30 days of this hospitalization Yes, I have reviewed the most recent assessment and entered updates Prior Assessment Date 01/13/23 Reason for Current Social Work Involvement Support/Coping Source of Information Patient Record Reviewed Yes Level of Care Inpatient What medium(S) of communication were used with patient/family/caregiver? Face-to-Face / In-Person Psychosocial issues requiring intervention Support/ Coping Psychosocial interventions 20 minutes direct contact with Mr. Guignard. Social work was consulted by the medical team because Mr. Sibayan decided to leave AMA . I inquired about his decision and he stated  I am done, I want to go home. I advised that the team wants  to complete additional testing before he leaves. He expressed understanding but continued to insist that he is ready to go home. I provided  active listening and assured him of my availability if any issues arise. He expressed understanding and appreciation for my support. Social work intervention complete at this time. [facility] Inpatient Medical Team Specific referrals to enhance community supports (include existing and new resources) see above. Handoff Required? No Next Steps/Plan (including hand-off): Social work intervention complete at this time. Signature: Delories Heinz, LMSW Contact Information: [phone]

## 2023-01-20 NOTE — Progress Notes
[facility] Care Navigation:  Transition of Care Note Care Navigation spoke with: PatientDischarging Hospital: [facility] Admission Date: 7/11/2024Hospital Discharge Date: 01/18/2023    Diagnosis: Sickle cell pain crisis Discharge location: [facility] Un[facility] Readmission: 8.05%HOSPITALIZATION:Reason patient admitted: Sickle cell pain crisis Diagnosis at discharge: Sickle cell pain crisis CURRENT STATE:Since discharge patient reports feeling: SamePt stated he is feeling alright; little under the weather Patient cared for by: Family  REVIEW OF AFTER VISIT SUMMARY DOCUMENT: MEDICATION CHANGES:Validated NEW medications to take: YesValidated Changed medications to take: N/AValidated Stopped medications to NOT take: YesIssues obtaining prescriptions: No FOLLOW-UP APPOINTMENTS and TRANSPORTATION:Patient aware of scheduled appointments: YesAwareness and assistance with appointments needing to be scheduled: NoTransportation concerns for follow-up appointment: NoDME and HOME HEALTH SERVICES:Durable medical equipment received: N/AContact has been made with home care agency: N/APlan established for follow up labs/tests: N/A ADDITIONAL PATIENT NEEDS:

## 2023-01-20 NOTE — Other
ORTHOPAEDIC SURGERY Patient name:  Phillip Bell MRN:	  [id number] Date of birth:	  August 04, 1997Date of admit:  7/11/2024Attending of record for this patient: Lewie Chamber, MD   Provider leaving this note: Dorrene German, MDInitial ConsultationReason for consultation:Rule out septic arthritis of left elbow jointHPI:Phillip Bell is a 27 y.o. male a history of sickle cell disease admitted to medicine/Oncology for the past 7 days due to a sickle crisis. Patient's blood cultures have been negative, on prophylactic antibiotics, elevated WBC, pending ESR CRPPatient has limited range of motion and a notable moderate-sized effusion in the left elbow noted on MRI. Passive and active range of motion of the elbow severely limited by pain.  He has been febrile once in the last 7 days. 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 Bernita Raisin AllergiesAllergies Allergen Reactions  Nickel Rash Outpatient MedicationsCurrent Outpatient Medications Medication Instructions  cefuroxime (CEFTIN) 500 mg, Oral, 2 Times Daily Scheduled  diclofenac (VOLTAREN) 2 g, Topical (Top), 3 Times Daily Scheduled  doxycycline hyclate (VIBRA-TABS) 100 mg, Oral, 2 Times Daily Scheduled  fluticasone propionate (FLONASE) 50 mcg/actuation nasal spray 1 spray, each nostril, Daily  fluticasone propionate (FLONASE) 50 mcg/actuation nasal spray 2 sprays, each nostril, 2 Times Daily Scheduled  hydroxyurea (HYDREA) 2,000 mg, Oral, Daily  ibuprofen (ADVIL,MOTRIN) 600 mg tablet Up to 4 tablets daily as needed for pain.  naloxone (NARCAN) 4 mg/actuation nasal spray Use 1 spray in 1 nostril for suspected opioid overdose. May repeat in 2 minutes in other nostril with new device if minimal or no response.  oxyCODONE (OXYCONTIN) 15 mg, Oral, EVERY 12 HOURS  oxyCODONE (ROXICODONE) 15 mg, Oral, EVERY 4 HOURS PRN Illicit Drug UseSocial History Substance and Sexual Activity Drug Use Not Currently  Types: Marijuana  Comment: last used on Tuesday SmokingTobacco Use: Low Risk  (09/02/2022)  Patient History   Smoking Tobacco Use: Never   Smokeless Tobacco Use: Never   Passive Exposure: Not on file Review of systemsConstitutional: Denies fevers, chillsENT: Denies any changesEyes: Denies any acute visual changesCardiovascular: Denies chest pain, palpitationsRespiratory: Denies difficulty breathing, other respiratory changesGastrointestinal: Denies constipation, diarrheaGenitourinary: Denies changes in urinationMusculoskeletal: Besides Chief Complaint, does other musculoskeletal pain/changesSkin: Denies any skin changesNeurologic: Denies any acute neurologic changesPsychiatric: Denies acute psychiatric changesEndocrine: Denies any endocrine changesHematologic: Denies any hematologic changesAllergic: Denies any allergic reactions/changes besides up to date allergy listPhysical ExaminationVitals:  01/18/23 0817 BP: (!) 145/83 Pulse: 81 Resp: 19 Temp: 98.3 ?F (36.8 ?C) Constitutional:NAD, Awake, AlertLUE:Skin intactNotable swelling and tenderness around the elbow jointRange of motion evidently limited by painSensation present grossly radial/median/ulnar nervesAble to make an okay sign, spread fingers, and thumbs-upFingers warm and well perfused, palpable  radial pulse, cap refill <2sImaging / studies: X-rays of the left elbow show no evidence of fracture or dislocation. MRI demonstrates the same joint effusion minimal signs of AVN or osteonecrosisLabs: Lab Results Component Value Date  CREATININE 0.58 01/17/2023  BUN 9 01/17/2023  NA 134 (L) 01/17/2023  K 4.3 01/17/2023  CL 100 01/17/2023  CO2 23 01/17/2023 Lab Results Component Value Date  WBC 12.9 (H) 01/17/2023  HGB 6.3 (LL) 01/17/2023  HCT 18.40 (L) 01/17/2023  MCV 90.2 01/17/2023  PLT 499 (H) 01/17/2023 Lab Results Component Value Date  INR 1.05 11/16/2021  INR 1.05 07/10/2021  INR 1.10 (H) 09/14/2020 Procedure: Joint AspirationFollowing a discussion of the risks and benefits, the patient agreed to a bedside left elbow joint aspiration. The elbow joint was identified. The skin was thoroughly cleansed with chlorhexidine solution. An 18 gauge needle was used to enter the elbow joint via a lateral approach. 5 cc of yellow-colored fluid was aspirated from the joint. This fluid was sent to the lab for cell count, crystals, gram stain, and culture. The aspiration site was covered with dry gauze. The patient tolerated the procedure well, with no acute complications. Assessment and Plan of Care:  Phillip Bell is a 27 y.o. 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 disease and current admission for sickle cell crisis. Few days of worsening left elbow pain with limited range of motion and elevated inflammatory markers concerning for septic arthritis. - Activity: NWB- NPO pending aspiration results- Follow up cell count, crystals, gram stain, and culturesWill discuss with Dr. LeslieContactJay Alric Ran, MDReachable by [facility] Follow-upY[facility] Medicine [facility] [phone] Trauma AttendingPatient discharged prior to my evaluationWe will follow cultures but suspicion is lowMP Verlon Au

## 2023-01-21 LAB — BLOOD CULTURE   (BH GH L LMW YH): BKR BLOOD CULTURE: NO GROWTH

## 2023-01-23 LAB — BODY FLUID CULTURE     (BH GH L LMW YH)
BKR BODY FLUID CULTURE: NO GROWTH x 1000/ÂµL (ref 2.00–7.60)
BKR GRAM STAIN (ABNORMAL): NONE SEEN Cells/uL — ABNORMAL HIGH

## 2023-01-27 ENCOUNTER — Telehealth
Admit: 2023-01-27 | Payer: PRIVATE HEALTH INSURANCE | Attending: Family | Primary: Student in an Organized Health Care Education/Training Program

## 2023-01-27 DIAGNOSIS — D571 Sickle-cell disease without crisis: Secondary | ICD-10-CM

## 2023-01-27 MED ORDER — OXYCODONE ER 15 MG TABLET,CRUSH RESISTANT,EXTENDED RELEASE 12 HR
15 | ORAL_TABLET | Freq: Two times a day (BID) | ORAL | 1 refills | Status: AC
Start: 2023-01-27 — End: ?

## 2023-01-27 MED ORDER — OXYCODONE IMMEDIATE RELEASE 15 MG TABLET
15 | ORAL_TABLET | ORAL | 1 refills | Status: AC | PRN
Start: 2023-01-27 — End: ?

## 2023-01-27 NOTE — Telephone Encounter
Patient calling for refill on oxyCODONE (ROXICODONE) 15 mg Immediate Release tablet [id number] - [place], [place] - [address]

## 2023-02-06 ENCOUNTER — Telehealth
Admit: 2023-02-06 | Payer: PRIVATE HEALTH INSURANCE | Primary: Student in an Organized Health Care Education/Training Program

## 2023-02-06 ENCOUNTER — Ambulatory Visit
Admit: 2023-02-06 | Payer: BLUE CROSS/BLUE SHIELD | Attending: Family | Primary: Student in an Organized Health Care Education/Training Program

## 2023-02-06 NOTE — Telephone Encounter
He missed his 4pm appointment with Mardene Celeste, he said he forgot. He would like to be rescheduled for 8/21 at 3pm with Mardene Celeste. He appreciated the call.

## 2023-02-12 ENCOUNTER — Encounter
Admit: 2023-02-12 | Payer: PRIVATE HEALTH INSURANCE | Primary: Student in an Organized Health Care Education/Training Program

## 2023-02-20 ENCOUNTER — Ambulatory Visit
Admit: 2023-02-20 | Payer: BLUE CROSS/BLUE SHIELD | Attending: Pediatric Hematology-Oncology | Primary: Student in an Organized Health Care Education/Training Program

## 2023-02-22 ENCOUNTER — Encounter
Admit: 2023-02-22 | Payer: PRIVATE HEALTH INSURANCE | Attending: Family | Primary: Student in an Organized Health Care Education/Training Program

## 2023-02-22 ENCOUNTER — Ambulatory Visit
Admit: 2023-02-22 | Payer: BLUE CROSS/BLUE SHIELD | Attending: Family | Primary: Student in an Organized Health Care Education/Training Program

## 2023-02-22 ENCOUNTER — Inpatient Hospital Stay
Admit: 2023-02-22 | Discharge: 2023-02-22 | Payer: BLUE CROSS/BLUE SHIELD | Primary: Student in an Organized Health Care Education/Training Program

## 2023-02-22 DIAGNOSIS — M2540 Effusion, unspecified joint: Secondary | ICD-10-CM

## 2023-02-22 DIAGNOSIS — H902 Conductive hearing loss, unspecified: Secondary | ICD-10-CM

## 2023-02-22 DIAGNOSIS — D571 Sickle-cell disease without crisis: Secondary | ICD-10-CM

## 2023-02-22 DIAGNOSIS — IMO0002 Dactylitis: Secondary | ICD-10-CM

## 2023-02-22 DIAGNOSIS — Z23 Encounter for immunization: Secondary | ICD-10-CM

## 2023-02-22 DIAGNOSIS — D6189 Other specified aplastic anemias and other bone marrow failure syndromes: Secondary | ICD-10-CM

## 2023-02-22 DIAGNOSIS — K219 Gastro-esophageal reflux disease without esophagitis: Secondary | ICD-10-CM

## 2023-02-22 DIAGNOSIS — Z7964 On hydroxyurea therapy: Secondary | ICD-10-CM

## 2023-02-22 DIAGNOSIS — M87052 Idiopathic aseptic necrosis of left femur: Secondary | ICD-10-CM

## 2023-02-22 DIAGNOSIS — D7389 Other diseases of spleen: Secondary | ICD-10-CM

## 2023-02-22 DIAGNOSIS — G8929 Other chronic pain: Secondary | ICD-10-CM

## 2023-02-22 DIAGNOSIS — D5700 Hb-SS disease with crisis, unspecified: Secondary | ICD-10-CM

## 2023-02-22 LAB — RHEUMATOID FACTOR: BKR RHEUMATOID FACTOR: <10 IU/mL (ref <14.0)

## 2023-02-22 MED ORDER — OXYCODONE ER 15 MG TABLET,CRUSH RESISTANT,EXTENDED RELEASE 12 HR
15 | ORAL_TABLET | Freq: Two times a day (BID) | ORAL | 1 refills | Status: AC
Start: 2023-02-22 — End: ?

## 2023-02-22 MED ORDER — HYDROXYUREA 500 MG CAPSULE
500 | ORAL_CAPSULE | Freq: Every day | ORAL | 3 refills | Status: AC
Start: 2023-02-22 — End: ?

## 2023-02-22 MED ORDER — OXYCODONE IMMEDIATE RELEASE 15 MG TABLET
15 | ORAL_TABLET | ORAL | 1 refills | Status: AC | PRN
Start: 2023-02-22 — End: ?

## 2023-02-24 LAB — ANA IFA SCREEN W/REFL TO TITER AND PATTERN, IFA: ANTINUCLEAR AB, HEP-2 SUBSTRATE, S: NEGATIVE
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
## 2023-03-02 ENCOUNTER — Telehealth
Admit: 2023-03-02 | Payer: PRIVATE HEALTH INSURANCE | Attending: Family | Primary: Student in an Organized Health Care Education/Training Program

## 2023-03-02 NOTE — Telephone Encounter
Phillip Bell called the office and states that he needs renewed authorization for oxyCODONE (OXYCONTIN) 15 mg 12 hr extended release tablet [id number] [place] [place] - [address]

## 2023-03-15 ENCOUNTER — Encounter
Admit: 2023-03-15 | Payer: PRIVATE HEALTH INSURANCE | Attending: Family | Primary: Student in an Organized Health Care Education/Training Program

## 2023-03-15 ENCOUNTER — Telehealth
Admit: 2023-03-15 | Payer: PRIVATE HEALTH INSURANCE | Attending: Pediatric Hematology-Oncology | Primary: Student in an Organized Health Care Education/Training Program

## 2023-03-15 DIAGNOSIS — D571 Sickle-cell disease without crisis: Secondary | ICD-10-CM

## 2023-03-15 MED ORDER — OXYCODONE ER 15 MG TABLET,CRUSH RESISTANT,EXTENDED RELEASE 12 HR
15 | ORAL_TABLET | Freq: Two times a day (BID) | ORAL | 1 refills | Status: AC
Start: 2023-03-15 — End: ?

## 2023-03-15 MED ORDER — OXYCODONE IMMEDIATE RELEASE 15 MG TABLET
15 | ORAL_TABLET | ORAL | 1 refills | Status: AC | PRN
Start: 2023-03-15 — End: ?

## 2023-03-15 NOTE — Telephone Encounter
Patient calling office stating he is in sickle crisis. Requesting a refill oxyCODONE (ROXICODONE) 15 mg Immediate Release tablet  and ibuprofen (ADVIL,MOTRIN) 600 mg tablet  [id number] [place] [place] - [address] Last filled 8/21 Follow up 10/28

## 2023-03-16 ENCOUNTER — Ambulatory Visit
Admit: 2023-03-16 | Payer: BLUE CROSS/BLUE SHIELD | Primary: Student in an Organized Health Care Education/Training Program

## 2023-03-16 ENCOUNTER — Ambulatory Visit
Admit: 2023-03-16 | Payer: BLUE CROSS/BLUE SHIELD | Attending: Critical Care Medicine | Primary: Student in an Organized Health Care Education/Training Program

## 2023-03-16 ENCOUNTER — Ambulatory Visit
Admit: 2023-03-16 | Payer: PRIVATE HEALTH INSURANCE | Primary: Student in an Organized Health Care Education/Training Program

## 2023-03-17 ENCOUNTER — Encounter
Admit: 2023-03-17 | Payer: PRIVATE HEALTH INSURANCE | Attending: Family | Primary: Student in an Organized Health Care Education/Training Program

## 2023-03-20 ENCOUNTER — Telehealth
Admit: 2023-03-20 | Payer: PRIVATE HEALTH INSURANCE | Attending: Pediatric Hematology-Oncology | Primary: Student in an Organized Health Care Education/Training Program

## 2023-03-20 ENCOUNTER — Ambulatory Visit
Admit: 2023-03-20 | Payer: PRIVATE HEALTH INSURANCE | Attending: Pediatric Hematology-Oncology | Primary: Student in an Organized Health Care Education/Training Program

## 2023-03-21 MED ORDER — IBUPROFEN 600 MG TABLET
600 | ORAL_TABLET | 2 refills | Status: AC
Start: 2023-03-21 — End: ?

## 2023-04-12 ENCOUNTER — Encounter
Admit: 2023-04-12 | Payer: PRIVATE HEALTH INSURANCE | Attending: Family | Primary: Student in an Organized Health Care Education/Training Program

## 2023-04-12 ENCOUNTER — Telehealth
Admit: 2023-04-12 | Payer: PRIVATE HEALTH INSURANCE | Attending: Pediatric Hematology-Oncology | Primary: Student in an Organized Health Care Education/Training Program

## 2023-04-12 DIAGNOSIS — D571 Sickle-cell disease without crisis: Secondary | ICD-10-CM

## 2023-04-12 MED ORDER — OXYCODONE IMMEDIATE RELEASE 15 MG TABLET
15 | ORAL_TABLET | ORAL | 1 refills | Status: AC | PRN
Start: 2023-04-12 — End: ?

## 2023-04-12 MED ORDER — OXYCODONE ER 15 MG TABLET,CRUSH RESISTANT,EXTENDED RELEASE 12 HR
15 | ORAL_TABLET | Freq: Two times a day (BID) | ORAL | 1 refills | Status: AC
Start: 2023-04-12 — End: ?

## 2023-04-12 NOTE — Telephone Encounter
 I called to let him know refills sent for oxycodone and oxycontin, he has refills for hydrea [facility]. He appreciated the rc.

## 2023-04-12 NOTE — Telephone Encounter
 Patient callig office requesting refill on hydroxyurea (HYDREA) 500 mg capsule , oxyCODONE (OXYCONTIN) 15 mg 12 hr extended release tablet , and oxyCODONE (ROXICODONE) 15 mg Immediate Release tablet  [id number] [place], [place] - [address] Next f/u 10/28

## 2023-04-21 ENCOUNTER — Encounter
Admit: 2023-04-21 | Payer: PRIVATE HEALTH INSURANCE | Primary: Student in an Organized Health Care Education/Training Program

## 2023-04-23 ENCOUNTER — Encounter
Admit: 2023-04-23 | Payer: PRIVATE HEALTH INSURANCE | Attending: Family | Primary: Student in an Organized Health Care Education/Training Program

## 2023-04-25 ENCOUNTER — Encounter
Admit: 2023-04-25 | Payer: PRIVATE HEALTH INSURANCE | Attending: Family | Primary: Student in an Organized Health Care Education/Training Program

## 2023-04-25 DIAGNOSIS — D571 Sickle-cell disease without crisis: Secondary | ICD-10-CM

## 2023-04-25 MED ORDER — OXYCODONE IMMEDIATE RELEASE 15 MG TABLET
15 | ORAL_TABLET | ORAL | 1 refills | Status: AC | PRN
Start: 2023-04-25 — End: ?

## 2023-04-25 MED ORDER — OXYCODONE ER 15 MG TABLET,CRUSH RESISTANT,EXTENDED RELEASE 12 HR
15 | ORAL_TABLET | Freq: Two times a day (BID) | ORAL | 1 refills | Status: AC
Start: 2023-04-25 — End: ?

## 2023-05-01 ENCOUNTER — Telehealth
Admit: 2023-05-01 | Payer: PRIVATE HEALTH INSURANCE | Primary: Student in an Organized Health Care Education/Training Program

## 2023-05-01 ENCOUNTER — Ambulatory Visit
Admit: 2023-05-01 | Payer: PRIVATE HEALTH INSURANCE | Attending: Pediatric Hematology-Oncology | Primary: Student in an Organized Health Care Education/Training Program

## 2023-05-01 NOTE — Telephone Encounter
The patient missed their appointment today.I called to make sure they are ok and to reschedule their appointment. I asked for a return call to the clinic at [phone].

## 2023-05-05 MED ORDER — IBUPROFEN 600 MG TABLET
600 | ORAL_TABLET | 2 refills | Status: AC
Start: 2023-05-05 — End: ?

## 2023-05-07 ENCOUNTER — Encounter
Admit: 2023-05-07 | Payer: PRIVATE HEALTH INSURANCE | Primary: Student in an Organized Health Care Education/Training Program

## 2023-05-17 ENCOUNTER — Inpatient Hospital Stay
Admit: 2023-05-17 | Discharge: 2023-05-17 | Payer: BLUE CROSS/BLUE SHIELD | Primary: Student in an Organized Health Care Education/Training Program

## 2023-05-17 ENCOUNTER — Ambulatory Visit
Admit: 2023-05-17 | Payer: BLUE CROSS/BLUE SHIELD | Attending: Family | Primary: Student in an Organized Health Care Education/Training Program

## 2023-05-17 ENCOUNTER — Encounter
Admit: 2023-05-17 | Payer: PRIVATE HEALTH INSURANCE | Primary: Student in an Organized Health Care Education/Training Program

## 2023-05-17 VITALS — BP 127/77 | HR 78 | Temp 97.90000°F | Ht 75.0 in | Wt 161.6 lb

## 2023-05-17 DIAGNOSIS — R058 Other specified cough: Secondary | ICD-10-CM

## 2023-05-17 DIAGNOSIS — D571 Sickle-cell disease without crisis: Secondary | ICD-10-CM

## 2023-05-17 DIAGNOSIS — J029 Acute pharyngitis, unspecified: Secondary | ICD-10-CM

## 2023-05-17 DIAGNOSIS — Z1152 Encounter for screening for COVID-19: Secondary | ICD-10-CM

## 2023-05-17 LAB — CBC WITH AUTO DIFFERENTIAL
BKR WAM ABSOLUTE IMMATURE GRANULOCYTES.: 0.06 x 1000/ÂµL (ref 0.00–0.30)
BKR WAM ABSOLUTE LYMPHOCYTE COUNT.: 1.98 x 1000/ÂµL (ref 0.60–3.70)
BKR WAM ABSOLUTE NRBC (2 DEC): 0.18 x 1000/ÂµL (ref 0.00–1.00)
BKR WAM ANC (ABSOLUTE NEUTROPHIL COUNT): 11.74 x 1000/ÂµL — ABNORMAL HIGH (ref 2.00–7.60)
BKR WAM BASOPHIL ABSOLUTE COUNT.: 0.1 x 1000/ÂµL (ref 0.00–1.00)
BKR WAM BASOPHILS: 0.6 % (ref 0.0–1.4)
BKR WAM EOSINOPHIL ABSOLUTE COUNT.: 0.37 x 1000/ÂµL (ref 0.00–1.00)
BKR WAM EOSINOPHILS: 2.4 % (ref 0.0–5.0)
BKR WAM HEMATOCRIT (2 DEC): 24.4 % — ABNORMAL LOW (ref 38.50–50.00)
BKR WAM HEMOGLOBIN: 8.2 g/dL — ABNORMAL LOW (ref 13.2–17.1)
BKR WAM IMMATURE GRANULOCYTES: 0.4 % (ref 0.0–1.0)
BKR WAM LYMPHOCYTES: 12.7 % — ABNORMAL LOW (ref 17.0–50.0)
BKR WAM MCH (PG): 31.9 pg (ref 27.0–33.0)
BKR WAM MCHC: 33.6 g/dL (ref 31.0–36.0)
BKR WAM MCV: 94.9 fL (ref 80.0–100.0)
BKR WAM MONOCYTE ABSOLUTE COUNT.: 1.32 x 1000/ÂµL — ABNORMAL HIGH (ref 0.00–1.00)
BKR WAM MONOCYTES: 8.5 % (ref 4.0–12.0)
BKR WAM MPV: 9 fL (ref 8.0–12.0)
BKR WAM NEUTROPHILS: 75.4 % — ABNORMAL HIGH (ref 39.0–72.0)
BKR WAM NUCLEATED RED BLOOD CELLS: 1.2 % — ABNORMAL HIGH (ref 0.0–1.0)
BKR WAM PLATELETS: 548 x1000/ÂµL — ABNORMAL HIGH (ref 150–420)
BKR WAM RDW-CV: 18.8 % — ABNORMAL HIGH (ref 11.0–15.0)
BKR WAM RED BLOOD CELL COUNT.: 2.57 M/ÂµL — ABNORMAL LOW (ref 4.00–6.00)
BKR WAM WHITE BLOOD CELL COUNT: 15.6 x1000/ÂµL — ABNORMAL HIGH (ref 4.0–11.0)

## 2023-05-17 LAB — HEPATIC FUNCTION PANEL
BKR A/G RATIO: 1.5 (ref 1.0–2.2)
BKR ALANINE AMINOTRANSFERASE (ALT): 57 U/L (ref 9–59)
BKR ALBUMIN: 4.8 g/dL (ref 3.6–5.1)
BKR ALKALINE PHOSPHATASE: 115 U/L (ref 9–122)
BKR ASPARTATE AMINOTRANSFERASE (AST): 65 U/L — ABNORMAL HIGH (ref 10–35)
BKR AST/ALT RATIO: 1.1
BKR BILIRUBIN DIRECT: 0.5 mg/dL — ABNORMAL HIGH (ref <=0.3)
BKR BILIRUBIN TOTAL: 3 mg/dL — ABNORMAL HIGH (ref <=1.2)
BKR GLOBULIN: 3.3 g/dL (ref 2.0–3.9)
BKR PROTEIN TOTAL: 8.1 g/dL (ref 5.9–8.3)

## 2023-05-17 LAB — RETICULOCYTES
BKR WAM IRF: 33.6 % — ABNORMAL HIGH (ref 3.0–15.9)
BKR WAM RETICULOCYTE - ABS (3 DEC): 0.189 10Ë6 cells/uL — ABNORMAL HIGH (ref 0.023–0.140)
BKR WAM RETICULOCYTE COUNT PCT (1 DEC): 7.3 % — ABNORMAL HIGH (ref 0.6–2.7)
BKR WAM RETICULOCYTE HGB EQUIVALENT: 32.8 pg (ref 28.2–35.7)

## 2023-05-17 LAB — BASIC METABOLIC PANEL
BKR ANION GAP: 11 (ref 7–17)
BKR BLOOD UREA NITROGEN: 9 mg/dL (ref 6–20)
BKR BUN / CREAT RATIO: 12.2 (ref 8.0–23.0)
BKR CALCIUM: 9.8 mg/dL (ref 8.8–10.2)
BKR CHLORIDE: 104 mmol/L (ref 98–107)
BKR CO2: 26 mmol/L (ref 20–30)
BKR CREATININE: 0.74 mg/dL (ref 0.40–1.30)
BKR EGFR, CREATININE (CKD-EPI 2021): >60 mL/min/{1.73_m2} (ref >=60)
BKR GLUCOSE: 98 mg/dL (ref 70–100)
BKR POTASSIUM: 4.5 mmol/L (ref 3.3–5.3)
BKR SODIUM: 141 mmol/L (ref 136–144)

## 2023-05-17 MED ORDER — OXYCODONE IMMEDIATE RELEASE 15 MG TABLET
15 | ORAL_TABLET | ORAL | 1 refills | Status: AC | PRN
Start: 2023-05-17 — End: ?

## 2023-05-17 MED ORDER — OXYCODONE ER 15 MG TABLET,CRUSH RESISTANT,EXTENDED RELEASE 12 HR
15 | ORAL_TABLET | Freq: Two times a day (BID) | ORAL | 1 refills | Status: AC
Start: 2023-05-17 — End: ?

## 2023-05-17 NOTE — Progress Notes
 SOCIAL WORK NOTEPatient Name: Phillip RiceMedical Record Number: [id number] Date of Birth: 1997-05-14Medical Social Work Follow Up  [employer] Most Recent Value Admission Information  Document Type Progress Note Reason for Current Social Work Involvement Support/Coping, Resources Intervention Psycho-social Intervention Source of Information Patient Record Reviewed Yes Level of Care Ambulatory What medium(s) of communication were used with patient/family/caregiver? Face-to-Face / In-Person Psychosocial issues requiring intervention Support/coping Psychosocial interventions 30 minutes were spent face to face with [name] with whom I am familiar with from previous clinic visits.  During our conversation, [name] shared his reflections on life, the changes he wished to implement, and the goals he hopes to achieve as he navigates the process of getting older.  He expressed a desire to replace his older car with a new one and discussed the possibility of relocating to a warmer climate.  I validated his concerns and provided emotional support and active listening.  [name] reported feeling safe at home and did not express any psychosocial concern, he denied experiencing any depressive symptoms, feeling of anxiety, thoughts of self-harm or substance abuse.  He reports having a strong support system and no major life changes and did not express any immediate needs/concern.  [name] has my contact information and is aware of social work availability. [facility] Cell Team Handoff Required? No Next Steps/Plan (including hand-off): No further social work intervention is indicated at this time.  Please reconsult if additional needs arise. Signature: Oren Bracket, LMSW Contact Information: [phone]

## 2023-05-18 ENCOUNTER — Ambulatory Visit
Admit: 2023-05-18 | Payer: BLUE CROSS/BLUE SHIELD | Primary: Student in an Organized Health Care Education/Training Program

## 2023-05-18 ENCOUNTER — Encounter
Admit: 2023-05-18 | Payer: PRIVATE HEALTH INSURANCE | Primary: Student in an Organized Health Care Education/Training Program

## 2023-05-18 LAB — RESPIRATORY VIRUS PCR PANEL     (BH GH LMW Q YH)
BKR ADENOVIRUS: NEGATIVE
BKR CORONAVIRUS 229E: NEGATIVE
BKR CORONAVIRUS HKU1: NEGATIVE
BKR CORONAVIRUS NL63: NEGATIVE
BKR CORONAVIRUS OC43: NEGATIVE
BKR HUMAN METAPNEUMOVIRUS (HMPV): NEGATIVE
BKR INFLUENZA A: NEGATIVE
BKR INFLUENZA B: NEGATIVE
BKR PARAINFLUENZA VIRUS 1: NEGATIVE
BKR PARAINFLUENZA VIRUS 2: NEGATIVE
BKR PARAINFLUENZA VIRUS 3: NEGATIVE
BKR PARAINFLUENZA VIRUS 4: NEGATIVE
BKR RESPIRATORY SYNCYTIAL VIRUS: NEGATIVE
BKR RHINOVIRUS: NEGATIVE
BKR SARS-COV-2 RNA (COVID-19) (YH): NEGATIVE

## 2023-05-18 MED ORDER — AMOXICILLIN 875 MG-POTASSIUM CLAVULANATE 125 MG TABLET
875-125 | ORAL_TABLET | Freq: Two times a day (BID) | ORAL | 1 refills | 10.00000 days | Status: AC
Start: 2023-05-18 — End: ?

## 2023-06-01 ENCOUNTER — Encounter
Admit: 2023-06-01 | Payer: PRIVATE HEALTH INSURANCE | Attending: Family | Primary: Student in an Organized Health Care Education/Training Program

## 2023-06-05 MED ORDER — HYDROXYUREA 500 MG CAPSULE
500 | ORAL_CAPSULE | 2 refills | Status: AC
Start: 2023-06-05 — End: ?

## 2023-06-08 ENCOUNTER — Telehealth
Admit: 2023-06-08 | Payer: PRIVATE HEALTH INSURANCE | Attending: Family | Primary: Student in an Organized Health Care Education/Training Program

## 2023-06-08 ENCOUNTER — Encounter
Admit: 2023-06-08 | Payer: PRIVATE HEALTH INSURANCE | Attending: Family | Primary: Student in an Organized Health Care Education/Training Program

## 2023-06-08 DIAGNOSIS — D571 Sickle-cell disease without crisis: Secondary | ICD-10-CM

## 2023-06-08 MED ORDER — OXYCODONE IMMEDIATE RELEASE 15 MG TABLET
15 | ORAL_TABLET | ORAL | 1 refills | Status: AC | PRN
Start: 2023-06-08 — End: ?

## 2023-06-08 MED ORDER — OXYCODONE ER 15 MG TABLET,CRUSH RESISTANT,EXTENDED RELEASE 12 HR
15 | ORAL_TABLET | Freq: Two times a day (BID) | ORAL | 1 refills | Status: AC
Start: 2023-06-08 — End: ?

## 2023-06-08 NOTE — Telephone Encounter
 Received call from: Contacts   Contact Date/Time Type Contact Phone/Fax  06/08/2023 12:45 PM EST Phone (Incoming) Phillip Bell, Phillip Bell (Self) [phone] (M)   Name of medication: oxyCODONE (ROXICODONE) 15 mg Immediate Release tablet Refill should be routed [facility] [place] [address]How many pills do you have left? 0Has patient contacted pharmacy to see if they have refills? YesPt stated that he is out of oxycodone IR still has some Oxycodon ER, Pt stated that he is in sickle cell pain, would like the IR medication to pick up asap

## 2023-06-23 ENCOUNTER — Ambulatory Visit
Admit: 2023-06-23 | Payer: BLUE CROSS/BLUE SHIELD | Attending: Student in an Organized Health Care Education/Training Program | Primary: Student in an Organized Health Care Education/Training Program

## 2023-06-23 MED ORDER — AMOXICILLIN 875 MG-POTASSIUM CLAVULANATE 125 MG TABLET
875-125 | ORAL_TABLET | Freq: Two times a day (BID) | ORAL | 1 refills | 10.00000 days | Status: AC
Start: 2023-06-23 — End: ?

## 2023-07-06 ENCOUNTER — Telehealth
Admit: 2023-07-06 | Payer: PRIVATE HEALTH INSURANCE | Attending: Family | Primary: Student in an Organized Health Care Education/Training Program

## 2023-07-06 NOTE — Telephone Encounter
 Hi,Patient called the office, to request a medication refill for oxyCODONE (ROXICODONE) 15 mg Immediate Release tablet be [facility] to [id number] [place], [place] - [address] Please advise.

## 2023-07-07 ENCOUNTER — Encounter
Admit: 2023-07-07 | Payer: PRIVATE HEALTH INSURANCE | Attending: Medical Oncology | Primary: Student in an Organized Health Care Education/Training Program

## 2023-07-07 DIAGNOSIS — D571 Sickle-cell disease without crisis: Secondary | ICD-10-CM

## 2023-07-07 MED ORDER — OXYCODONE IMMEDIATE RELEASE 15 MG TABLET
15 | ORAL_TABLET | ORAL | 1 refills | Status: AC | PRN
Start: 2023-07-07 — End: ?

## 2023-07-07 NOTE — Telephone Encounter
 Refill for oxycodone electronically sent to Pharmacy

## 2023-07-17 NOTE — Telephone Encounter
Disregard

## 2023-07-19 ENCOUNTER — Encounter
Admit: 2023-07-19 | Payer: PRIVATE HEALTH INSURANCE | Attending: Vascular and Interventional Radiology | Primary: Student in an Organized Health Care Education/Training Program

## 2023-07-20 ENCOUNTER — Encounter
Admit: 2023-07-20 | Payer: PRIVATE HEALTH INSURANCE | Primary: Student in an Organized Health Care Education/Training Program

## 2023-07-20 ENCOUNTER — Ambulatory Visit
Admit: 2023-07-20 | Payer: BLUE CROSS/BLUE SHIELD | Primary: Student in an Organized Health Care Education/Training Program

## 2023-07-20 ENCOUNTER — Telehealth
Admit: 2023-07-20 | Payer: PRIVATE HEALTH INSURANCE | Attending: Student in an Organized Health Care Education/Training Program | Primary: Student in an Organized Health Care Education/Training Program

## 2023-07-20 ENCOUNTER — Telehealth
Admit: 2023-07-20 | Payer: PRIVATE HEALTH INSURANCE | Attending: Family | Primary: Student in an Organized Health Care Education/Training Program

## 2023-07-20 DIAGNOSIS — J029 Acute pharyngitis, unspecified: Secondary | ICD-10-CM

## 2023-07-20 DIAGNOSIS — U071 COVID-19 virus infection: Secondary | ICD-10-CM

## 2023-07-20 DIAGNOSIS — D6189 Other specified aplastic anemias and other bone marrow failure syndromes: Secondary | ICD-10-CM

## 2023-07-20 DIAGNOSIS — D571 Sickle-cell disease without crisis: Secondary | ICD-10-CM

## 2023-07-20 DIAGNOSIS — R509 Fever, unspecified: Secondary | ICD-10-CM

## 2023-07-20 DIAGNOSIS — D5700 Hb-SS disease with crisis, unspecified: Secondary | ICD-10-CM

## 2023-07-20 DIAGNOSIS — G8929 Other chronic pain: Secondary | ICD-10-CM

## 2023-07-20 DIAGNOSIS — M87052 Idiopathic aseptic necrosis of left femur: Secondary | ICD-10-CM

## 2023-07-20 DIAGNOSIS — Z23 Encounter for immunization: Secondary | ICD-10-CM

## 2023-07-20 DIAGNOSIS — Z7964 On hydroxyurea therapy: Secondary | ICD-10-CM

## 2023-07-20 DIAGNOSIS — K219 Gastro-esophageal reflux disease without esophagitis: Secondary | ICD-10-CM

## 2023-07-20 DIAGNOSIS — R059 Cough, unspecified type: Secondary | ICD-10-CM

## 2023-07-20 DIAGNOSIS — IMO0002 Dactylitis: Secondary | ICD-10-CM

## 2023-07-20 DIAGNOSIS — D7389 Other diseases of spleen: Secondary | ICD-10-CM

## 2023-07-20 DIAGNOSIS — H902 Conductive hearing loss, unspecified: Secondary | ICD-10-CM

## 2023-07-20 MED ORDER — MOLNUPIRAVIR 200 MG CAPSULE (EUA)
200 | ORAL_CAPSULE | Freq: Two times a day (BID) | ORAL | 1 refills | 5.00000 days | Status: AC
Start: 2023-07-20 — End: ?

## 2023-07-20 MED ORDER — OXYCONTIN 15 MG TABLET,CRUSH RESISTANT,EXTENDED RELEASE
15 | Freq: Two times a day (BID) | ORAL | 1.00 refills | 2.00000 days | Status: AC
Start: 2023-07-20 — End: 2023-07-20

## 2023-07-20 MED ORDER — OXYCONTIN 15 MG TABLET,CRUSH RESISTANT,EXTENDED RELEASE
15 | ORAL_TABLET | Freq: Two times a day (BID) | ORAL | 1 refills | Status: AC
Start: 2023-07-20 — End: ?

## 2023-07-20 MED ORDER — OXYCODONE IMMEDIATE RELEASE 15 MG TABLET
15 | ORAL_TABLET | ORAL | 1 refills | Status: AC | PRN
Start: 2023-07-20 — End: ?

## 2023-07-20 NOTE — Telephone Encounter
 Patient called the office stating that he has the Flu, and it's causing for him to have a sickle cell flare up. His flare up started on Sunday 1/12.  Patient would like to refill pain medication - OXYCONTIN 15 mg 12 hr extended release tablet and oxyCODONE (ROXICODONE) 15 mg Immediate Release tablet. Pt also stated that he will need a prior authorization on his medication if that can be called into [employer]. Medication can be sent to pharmacy [phone] [place] [place] - [address] [place] [id number]: [phone] Fax: [phone]

## 2023-07-30 ENCOUNTER — Encounter
Admit: 2023-07-30 | Payer: PRIVATE HEALTH INSURANCE | Attending: Family | Primary: Student in an Organized Health Care Education/Training Program

## 2023-08-03 ENCOUNTER — Ambulatory Visit
Admit: 2023-08-03 | Payer: BLUE CROSS/BLUE SHIELD | Attending: Critical Care Medicine | Primary: Student in an Organized Health Care Education/Training Program

## 2023-08-03 ENCOUNTER — Inpatient Hospital Stay
Admit: 2023-08-03 | Discharge: 2023-08-03 | Payer: BLUE CROSS/BLUE SHIELD | Primary: Student in an Organized Health Care Education/Training Program

## 2023-08-03 ENCOUNTER — Encounter
Admit: 2023-08-03 | Payer: PRIVATE HEALTH INSURANCE | Attending: Critical Care Medicine | Primary: Student in an Organized Health Care Education/Training Program

## 2023-08-03 VITALS — Ht 72.087 in | Wt 203.5 lb

## 2023-08-03 VITALS — BP 130/80 | HR 76 | Temp 97.00000°F | Resp 18 | Ht 72.09 in | Wt 203.5 lb

## 2023-08-03 DIAGNOSIS — G8929 Other chronic pain: Secondary | ICD-10-CM

## 2023-08-03 DIAGNOSIS — I272 Pulmonary hypertension, unspecified: Secondary | ICD-10-CM

## 2023-08-03 DIAGNOSIS — R0602 Shortness of breath: Secondary | ICD-10-CM

## 2023-08-03 DIAGNOSIS — D7389 Other diseases of spleen: Secondary | ICD-10-CM

## 2023-08-03 DIAGNOSIS — D5700 Hb-SS disease with crisis, unspecified: Secondary | ICD-10-CM

## 2023-08-03 DIAGNOSIS — M87052 Idiopathic aseptic necrosis of left femur: Secondary | ICD-10-CM

## 2023-08-03 DIAGNOSIS — Z23 Encounter for immunization: Secondary | ICD-10-CM

## 2023-08-03 DIAGNOSIS — Z7964 On hydroxyurea therapy: Secondary | ICD-10-CM

## 2023-08-03 DIAGNOSIS — IMO0002 Dactylitis: Secondary | ICD-10-CM

## 2023-08-03 DIAGNOSIS — D571 Sickle-cell disease without crisis: Secondary | ICD-10-CM

## 2023-08-03 DIAGNOSIS — D6189 Other specified aplastic anemias and other bone marrow failure syndromes: Secondary | ICD-10-CM

## 2023-08-03 DIAGNOSIS — H902 Conductive hearing loss, unspecified: Secondary | ICD-10-CM

## 2023-08-03 DIAGNOSIS — K219 Gastro-esophageal reflux disease without esophagitis: Secondary | ICD-10-CM

## 2023-08-05 NOTE — Progress Notes
 Assessment and Plan: Phillip Bell is a 28 y.o. male with a history notable for sickle cell disease who is presenting for the evaluation of pulmonary vascular disease.  He describes unlimited exertional tolerance.  A prior echocardiogram during a sickle cell crisis was revealing for an elevated RVSP.  His gas exchange on sub maximum exercise testing is fairly unremarkable except for decrease in dynamic pulmonary perfusion.I have a low suspicion for precapillary pulmonary hypertension and suspect that if any hemodynamic abnormality is present, this would be postcapillary pulmonary hypertension in the setting of anemia and high output state.  I do not feel that a right heart catheterization is indicated at this juncture unless it is critical to distinguish between pre- and postcapillary pulmonary hypertension.  I would like to repeat an echocardiogram now that he is not in a sickle cell crisis and will also schedule pulmonary function tests to assess his diffusing capacity.-schedule echocardiogram -schedule pulmonary function testsPhillip Jomarie Longs, MDPulmonary, Critical Care, and Sleep MedicineSubjective  Reason for visit: Evaluation of potential pulmonary vascular diseaseReferring Provider: McManusHistory of Present Illness Phillip Bell is a 28 y.o. male who present for the evaluation of pulmonary vascular disease.  He has a history notable for sickle cell disease with infrequent pain crises requiring hospitalization.  He had a sickle cell crisis last year with an echocardiogram during that admission showing an elevated RVSP.  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/2010 Hb Electrophoresis: Hb S = 88.2%, HbF= 3.9%, Hb A2= 3.7%), On hydroxyurea therapy (started October 2013), Pneumococcal vaccination given (01/20/2006 and 09/28/2012), 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), and Vasoocclusive sickle cell crisis (HC Code) (HC CODE) (HC Code) (Adm 01/02/2012, ER 04/08/12, 4/1-10/03/12, 7/2-01/05/13, 12/02/13-12/03/13 , March 2016 - 2 admissions).has Eustachian tube disorder, bilateral; Tonsillar and adenoid hypertrophy; Sickle cell disease, type SS (HC Code); Avascular necrosis of left femoral head (HC Code); Hx acute chest syndrome (HC Code); Hx cholecystectomy; Closed displaced segmental fracture of shaft of left femur, initial encounter (HC Code); Sickle cell disease without crisis (HC Code) (HC CODE) (HC Code); Decreased range of motion (ROM) of left knee; Decreased strength of lower extremity; Altered gait; Sickle cell disease with crisis (HC Code) (HC CODE) (HC Code); Pneumonia; Acute chest syndrome (HC Code); Closed disp transverse fracture of shaft of left femur with nonunion; Left leg injury; Sickle cell pain crisis (HC Code) (HC CODE) (HC Code); Sepsis, due to unspecified organism, unspecified whether acute organ dysfunction present (HC Code) (HC CODE) (HC Code); Cellulitis of left thigh; Tonsillitis; and Acute non-recurrent maxillary sinusitis on their problem list. Past Surgical History: Phillip Bell has a past surgical history that includes Cholecystectomy, laparoscopic (07/2013); Tympanostomy tube placement (09/22/2014); IM nailing femoral shaft fracture (Left, 12/2019); and Repair nonunion / malunion femur (Left, 09/2020).Medications: Reviewed, notable for Current Outpatient Medications Medication Sig  diclofenac (VOLTAREN) 1 % gel Apply 2 g topically 3 (three) times daily.  hydroxyurea (HYDREA) 500 mg capsule TAKE 4 CAPSULES (2,000MG  TOTAL) BY MOUTH DAILY  ibuprofen (ADVIL,MOTRIN) 600 mg tablet UP TO 4 TABLETS DAILY AS NEEDED FOR PAIN.  naloxone (NARCAN) 4 mg/actuation nasal spray Use 1 spray in 1 nostril for suspected opioid overdose. May repeat in 2 minutes in other nostril  with new device if minimal or no response.  oxyCODONE (ROXICODONE) 15 mg Immediate Release tablet Take 1 tablet (15 mg total) by mouth every 4 (four) hours as needed for pain.  OXYCONTIN 15 mg 12 hr extended release tablet Take 1 tablet (15 mg total) by mouth every 12 (twelve) hours. No current facility-administered medications for this visit. No current facility-administered medications for this visit. Allergies: He is allergic to nickel. Family History: Mother - ; Father - family history includes Alcohol abuse in his maternal aunt; Diabetes in his paternal grandfather; Hypertension in his paternal grandfather; Sarcoidosis in his father; Sickle cell trait in his father and mother; Thyroid disease in his mother. Social History:  reports that he has never smoked. He has never used smokeless tobacco. He reports current alcohol use. He reports that he does not currently use drugs after having used the following drugs: Marijuana.Review of Systems 14-point review of systems is negative except for what is noted in the history of present illness.Physical Exam General: No acute distress, speaking in full sentences, sitting in chair comfortablyVS: T afeb, BP 130/80, P 76, RR 18, O2 Sat 98% on RAHEENT: No cervical or supraclavicular lymphadenopathy, no nasopharyngeal discharge or erythema, oropharynx is non-erythematousNeck: No JVDCV: Normal S1, S2, P2 not accentuated, no murmurs, rubs, or gallopsChest: Clear to auscultation bilaterally, no crackles or wheezesAbdomen: Normal bowel sounds, non-distended, no tenderness to palpationLower Extremities: No edema bilaterally, warm, well-perfusedUpper Extremities: No digital clubbingNeuro: Cranial nerves are grossly intact with no appreciable focal neurologic deficitsSkin: No cutaneous stigmata of connective tissue diseaseLaboratory   Chemistry    Component Value Date/Time  NA 141 05/17/2023 1519  K 4.5 05/17/2023 1519  CL 104 05/17/2023 1519  CO2 26 05/17/2023 1519  BUN 9 05/17/2023 1519  CREATININE 0.74 05/17/2023 1519  GLU 98 05/17/2023 1519  PROT 8.1 05/17/2023 1519    Component Value Date/Time  CALCIUM 9.8 05/17/2023 1519  ALKPHOS 115 05/17/2023 1519  AST 65 (H) 05/17/2023 1519  AST 59 (H) 09/18/2014 0739  ALT 57 05/17/2023 1519  ALT 16 09/18/2014 0739  BILITOT 3.0 (H) 05/17/2023 1519  ALBUMIN 4.8 05/17/2023 1519  Lab Results Component Value Date  WBC 15.6 (H) 05/17/2023  HGB 8.2 (L) 05/17/2023  HCT 24.40 (L) 05/17/2023  MCV 94.9 05/17/2023  PLT 548 (H) 05/17/2023 Microbiology Component    Latest Ref Rng 07/20/2023 Rapid Covid Ag/Flu AB Method Sofia Quidel Combo  POC SARS-CoV-2 (COVID-19) Antigen    Negative  Positive !  Rapid Influenza A Ag    Negative  neg  Rapid Influenza B Ag    Negative  neg  POC Covid-19/Flu AB Kit Lot Number [id number]  POC Covid-19/Flu AB Expiration Date [id number]  POC Covid-19/Flu AB Internal Control Pass  Imaging CXR (07/10/23): FINDINGS: The lungs are clear and show no radiographic evidence for airspace opacity or volume overload. There is no evidence for pneumothorax. The trachea is midline. There are no pleural fluid collections. Unremarkable cardiomediastinal silhouette. There are no acute musculoskeletal changes. IMPRESSION:  No radiographic evidence for airspace opacity or acute radiographic change.CTA Chest(PE) (01/13/23): FINDINGS: CTA: The study is technically adequate with a good contrast bolus to the pulmonary arteries. There are no filling defects in the pulmonary artery or its branches. Limited evaluation for subsegmental arteries in the lower lobes due to parenchymal opacities. The cardiac chambers appear normal in size. There is no pericardial effusion. Aorta is normal in course and caliber. Lungs/Airways/Pleura: Bibasilar consolidations. Stable scattered sub-6 mm pulmonary nodules, examples include 4 mm  nodule in the right middle lobe (2:127). No new or enlarging pulmonary nodules. Central airways are patent. No pleural effusion. Mediastinum/Lymph nodes: Stable prominent left axillary lymph node measuring 1.2 cm and right axillary lymph node measuring 1.2 cm ((2:291).Marland Kitchen Upper Abdomen: Spleen is small in size. Bones and Soft Tissues: No aggressive osseous lesions. Sclerosis in the bilateral femoral heads. H-shaped vertebrae in keeping with known sickle cell. IMPRESSION:No evidence of pulmonary embolism. Bibasilar consolidations most favored to represent atelectasis, pneumonia needs to be clinically excluded. Previously seen nodular opacities in the bilateral lungs have now resolved.[place] Chest (01/2023): CTA: The study is technically adequate with a good contrast bolus to the pulmonary arteries. There are no filling defects in the pulmonary artery or its branches. Limited evaluation for subsegmental arteries in the lower lobes due to parenchymal opacities. The cardiac chambers appear normal in size. There is no pericardial effusion. Aorta is normal in course and caliber. Lungs/Airways/Pleura: Bibasilar consolidations. Stable scattered sub-6 mm pulmonary nodules, examples include 4 mm nodule in the right middle lobe (2:127). No new or enlarging pulmonary nodules. Central airways are patent. No pleural effusion. Mediastinum/Lymph nodes: Stable prominent left axillary lymph node measuring 1.2 cm and right axillary lymph node measuring 1.2 cm ((2:291).Marland Kitchen Upper Abdomen: Spleen is small in size. Bones and Soft Tissues: No aggressive osseous lesions. Sclerosis in the bilateral femoral heads. H-shaped vertebrae in keeping with known sickle cell. TTE (01/15/23): * Normal left ventricular size, wall thickness, systolic function and wall motion. LVEF calculated by biplane Simpson's was 56%.  Global longitudinal strain was -17%.* Mildly increased right ventricular cavity size.  Mildly decreased right ventricular systolic function.  Estimated right ventricular systolic pressure is 50 mmHg.* Atria are normal in size.* No significant valvular abnormalities.* IVC diameter < 2.1 cm that collapses > 50% with a sniff suggests normal RAP (0-5 mmHg, mean 3 mmHg).* No significant pericardial effusion.* No prior study available for comparison.SHAPE:Date Score RER VE/VCO2 PETCO2 ?PETCO2 OUES Peak VO2 Extrap. Peak VO2 GxCap SpO2% 07/2023 -0.5 0.94 25 39 -0.5 69% 53% 69% 678 92%   Scribed for Arlean Hopping, MD by Basil Dess, medical scribe, August 03, 2023 The documentation recorded by the scribe accurately reflects the services I personally performed and the decisions made by me. I reviewed and confirmed all material entered and/or pre-charted by the scribe.

## 2023-08-07 ENCOUNTER — Encounter
Admit: 2023-08-07 | Payer: PRIVATE HEALTH INSURANCE | Primary: Student in an Organized Health Care Education/Training Program

## 2023-08-10 MED ORDER — IBUPROFEN 600 MG TABLET
600 | ORAL_TABLET | 2 refills | Status: AC
Start: 2023-08-10 — End: ?

## 2023-08-16 ENCOUNTER — Encounter
Admit: 2023-08-16 | Payer: PRIVATE HEALTH INSURANCE | Attending: Family | Primary: Student in an Organized Health Care Education/Training Program

## 2023-08-16 ENCOUNTER — Ambulatory Visit
Admit: 2023-08-16 | Payer: BLUE CROSS/BLUE SHIELD | Attending: Family | Primary: Student in an Organized Health Care Education/Training Program

## 2023-08-16 ENCOUNTER — Encounter
Admit: 2023-08-16 | Payer: PRIVATE HEALTH INSURANCE | Primary: Student in an Organized Health Care Education/Training Program

## 2023-08-16 DIAGNOSIS — D5700 Hb-SS disease with crisis, unspecified: Secondary | ICD-10-CM

## 2023-08-16 DIAGNOSIS — Z7964 On hydroxyurea therapy: Secondary | ICD-10-CM

## 2023-08-16 DIAGNOSIS — D6189 Other specified aplastic anemias and other bone marrow failure syndromes: Secondary | ICD-10-CM

## 2023-08-16 DIAGNOSIS — D571 Sickle-cell disease without crisis: Secondary | ICD-10-CM

## 2023-08-16 DIAGNOSIS — H902 Conductive hearing loss, unspecified: Secondary | ICD-10-CM

## 2023-08-16 DIAGNOSIS — K219 Gastro-esophageal reflux disease without esophagitis: Secondary | ICD-10-CM

## 2023-08-16 DIAGNOSIS — M87052 Idiopathic aseptic necrosis of left femur: Secondary | ICD-10-CM

## 2023-08-16 DIAGNOSIS — D7389 Other diseases of spleen: Secondary | ICD-10-CM

## 2023-08-16 DIAGNOSIS — Z23 Encounter for immunization: Secondary | ICD-10-CM

## 2023-08-16 DIAGNOSIS — Z01818 Encounter for other preprocedural examination: Secondary | ICD-10-CM

## 2023-08-16 DIAGNOSIS — IMO0002 Dactylitis: Secondary | ICD-10-CM

## 2023-08-16 DIAGNOSIS — G8929 Other chronic pain: Secondary | ICD-10-CM

## 2023-08-16 MED ORDER — OXYCONTIN 15 MG TABLET,CRUSH RESISTANT,EXTENDED RELEASE
15 | ORAL_TABLET | Freq: Two times a day (BID) | ORAL | 1 refills | Status: AC
Start: 2023-08-16 — End: ?

## 2023-08-16 MED ORDER — OXYCODONE IMMEDIATE RELEASE 15 MG TABLET
15 | ORAL_TABLET | ORAL | 1 refills | Status: AC | PRN
Start: 2023-08-16 — End: ?

## 2023-08-16 NOTE — Progress Notes
 SOCIAL WORK NOTEPatient Name: Nijah RiceMedical Record Number: [id number] Date of Birth: 02/28/1997Medical Social Work Follow Up  [employer] Most Recent Value Admission Information  Document Type Progress Note Reason for Current Social Work Involvement Support/Coping, Resources Intervention Psycho-social Intervention Source of Information Patient Record Reviewed Yes Level of Care Ambulatory What medium(s) of communication were used with patient/family/caregiver? Face-to-Face / In-Person Psychosocial issues requiring intervention Support/coping Psychosocial interventions 20 minutes were spent face to face with Mr. Hallenbeck,  with whom I am familiar with from previous clinic visits. He shared that he is feeling good overall.  He discussed his upcoming tonsillectomy surgery scheduled for February 24th.  He will have the support of his mother during his recovery.  I wish him a safe surgery and provided some information about FMLA.  [facility] Medical Leave Act).  He expressed his gratitude for the information.  I provided emotional support and active listening.  Mr.  Kushnir  has my contact information and is aware of social work availability. [facility] Cell [facility] Handoff Required? No Next Steps/Plan (including hand-off): No further social work intervention is indicated at this time.  Please reconsult if additional needs arise. Signature: Oren Bracket, LMSW Contact Information: [phone]

## 2023-08-21 ENCOUNTER — Encounter
Admit: 2023-08-21 | Payer: PRIVATE HEALTH INSURANCE | Attending: Otolaryngology | Primary: Student in an Organized Health Care Education/Training Program

## 2023-08-21 DIAGNOSIS — Z23 Encounter for immunization: Secondary | ICD-10-CM

## 2023-08-21 DIAGNOSIS — D5700 Hb-SS disease with crisis, unspecified: Secondary | ICD-10-CM

## 2023-08-21 DIAGNOSIS — M87052 Idiopathic aseptic necrosis of left femur: Secondary | ICD-10-CM

## 2023-08-21 DIAGNOSIS — D571 Sickle-cell disease without crisis: Secondary | ICD-10-CM

## 2023-08-21 DIAGNOSIS — D7389 Other diseases of spleen: Secondary | ICD-10-CM

## 2023-08-21 DIAGNOSIS — IMO0002 Dactylitis: Secondary | ICD-10-CM

## 2023-08-21 DIAGNOSIS — K219 Gastro-esophageal reflux disease without esophagitis: Secondary | ICD-10-CM

## 2023-08-21 DIAGNOSIS — Z862 Personal history of diseases of the blood and blood-forming organs and certain disorders involving the immune mechanism: Secondary | ICD-10-CM

## 2023-08-21 DIAGNOSIS — Z8616 History of COVID-19: Secondary | ICD-10-CM

## 2023-08-21 DIAGNOSIS — Z7964 On hydroxyurea therapy: Secondary | ICD-10-CM

## 2023-08-21 DIAGNOSIS — D6189 Other specified aplastic anemias and other bone marrow failure syndromes: Secondary | ICD-10-CM

## 2023-08-21 DIAGNOSIS — G8929 Other chronic pain: Secondary | ICD-10-CM

## 2023-08-22 ENCOUNTER — Ambulatory Visit
Admit: 2023-08-22 | Payer: BLUE CROSS/BLUE SHIELD | Primary: Student in an Organized Health Care Education/Training Program

## 2023-08-22 ENCOUNTER — Inpatient Hospital Stay
Admit: 2023-08-22 | Discharge: 2023-08-22 | Payer: BLUE CROSS/BLUE SHIELD | Primary: Student in an Organized Health Care Education/Training Program

## 2023-08-22 DIAGNOSIS — D5700 Hb-SS disease with crisis, unspecified: Secondary | ICD-10-CM

## 2023-08-22 DIAGNOSIS — Z01818 Encounter for other preprocedural examination: Secondary | ICD-10-CM

## 2023-08-22 LAB — RETICULOCYTES
BKR WAM IRF: 33.3 % — ABNORMAL HIGH (ref 3.0–15.9)
BKR WAM RETICULOCYTE - ABS (3 DEC): 0.224 10Ë6 cells/uL — ABNORMAL HIGH (ref 0.023–0.140)
BKR WAM RETICULOCYTE COUNT PCT (1 DEC): 9 % — ABNORMAL HIGH (ref 0.6–2.7)
BKR WAM RETICULOCYTE HGB EQUIVALENT: 39.3 pg — ABNORMAL HIGH (ref 28.2–35.7)

## 2023-08-22 LAB — BASIC METABOLIC PANEL
BKR ANION GAP: 12 (ref 7–17)
BKR BLOOD UREA NITROGEN: 10 mg/dL (ref 6–20)
BKR BUN / CREAT RATIO: 12.7 (ref 8.0–23.0)
BKR CALCIUM: 9.7 mg/dL (ref 8.8–10.2)
BKR CHLORIDE: 103 mmol/L (ref 98–107)
BKR CO2: 23 mmol/L (ref 20–30)
BKR CREATININE DELTA: 0.05
BKR CREATININE: 0.79 mg/dL (ref 0.40–1.30)
BKR EGFR, CREATININE (CKD-EPI 2021): >60 mL/min/{1.73_m2} (ref >=60)
BKR GLUCOSE: 101 mg/dL — ABNORMAL HIGH (ref 70–100)
BKR POTASSIUM: 5 mmol/L (ref 3.3–5.3)
BKR SODIUM: 138 mmol/L (ref 136–144)

## 2023-08-22 LAB — CBC WITH AUTO DIFFERENTIAL
BKR WAM ABSOLUTE IMMATURE GRANULOCYTES.: 0.02 x 1000/ÂµL (ref 0.00–0.30)
BKR WAM ABSOLUTE LYMPHOCYTE COUNT.: 2.5 x 1000/ÂµL (ref 0.60–3.70)
BKR WAM ABSOLUTE NRBC (2 DEC): 0.47 x 1000/ÂµL (ref 0.00–1.00)
BKR WAM ANC (ABSOLUTE NEUTROPHIL COUNT): 4.28 x 1000/ÂµL (ref 2.00–7.60)
BKR WAM BASOPHIL ABSOLUTE COUNT.: 0.04 x 1000/ÂµL (ref 0.00–1.00)
BKR WAM BASOPHILS: 0.5 % (ref 0.0–1.4)
BKR WAM EOSINOPHIL ABSOLUTE COUNT.: 0.16 x 1000/ÂµL (ref 0.00–1.00)
BKR WAM EOSINOPHILS: 2.1 % (ref 0.0–5.0)
BKR WAM HEMATOCRIT (2 DEC): 24.8 % — ABNORMAL LOW (ref 38.50–50.00)
BKR WAM HEMOGLOBIN: 8.8 g/dL — ABNORMAL LOW (ref 13.2–17.1)
BKR WAM IMMATURE GRANULOCYTES: 0.3 % (ref 0.0–1.0)
BKR WAM LYMPHOCYTES: 32.9 % (ref 17.0–50.0)
BKR WAM MCH (PG): 35.5 pg — ABNORMAL HIGH (ref 27.0–33.0)
BKR WAM MCHC: 35.5 g/dL (ref 31.0–36.0)
BKR WAM MCV: 100 fL (ref 80.0–100.0)
BKR WAM MONOCYTE ABSOLUTE COUNT.: 0.59 x 1000/ÂµL (ref 0.00–1.00)
BKR WAM MONOCYTES: 7.8 % (ref 4.0–12.0)
BKR WAM MPV: 9 fL (ref 8.0–12.0)
BKR WAM NEUTROPHILS: 56.4 % (ref 39.0–72.0)
BKR WAM NUCLEATED RED BLOOD CELLS: 6.2 % — ABNORMAL HIGH (ref 0.0–1.0)
BKR WAM PLATELETS: 495 x1000/ÂµL — ABNORMAL HIGH (ref 150–420)
BKR WAM RDW-CV: 18.4 % — ABNORMAL HIGH (ref 11.0–15.0)
BKR WAM RED BLOOD CELL COUNT.: 2.48 M/ÂµL — ABNORMAL LOW (ref 4.00–6.00)
BKR WAM WHITE BLOOD CELL COUNT: 7.6 x1000/ÂµL (ref 4.0–11.0)

## 2023-08-22 LAB — HEPATIC FUNCTION PANEL
BKR A/G RATIO: 1.5 (ref 1.0–2.2)
BKR ALANINE AMINOTRANSFERASE (ALT): 16 U/L (ref 9–59)
BKR ALBUMIN: 4.8 g/dL (ref 3.6–5.1)
BKR ALKALINE PHOSPHATASE: 77 U/L (ref 9–122)
BKR ASPARTATE AMINOTRANSFERASE (AST): 41 U/L — ABNORMAL HIGH (ref 10–35)
BKR AST/ALT RATIO: 2.6
BKR BILIRUBIN DIRECT: 0.5 mg/dL — ABNORMAL HIGH (ref <=0.2)
BKR BILIRUBIN TOTAL: 2.6 mg/dL — ABNORMAL HIGH (ref <=1.2)
BKR GLOBULIN: 3.1 g/dL (ref 2.0–3.9)
BKR PROTEIN TOTAL: 7.9 g/dL (ref 5.9–8.3)

## 2023-08-22 LAB — PROTIME AND INR
BKR INR: 1.06 (ref 0.86–1.13)
BKR PROTHROMBIN TIME: 11.6 s (ref 9.6–12.3)

## 2023-08-23 ENCOUNTER — Telehealth
Admit: 2023-08-23 | Payer: PRIVATE HEALTH INSURANCE | Primary: Student in an Organized Health Care Education/Training Program

## 2023-08-23 NOTE — Telephone Encounter
 I called to make him aware he is scheduled for 1u PRBC's on NP7 at 08/26/23 at 1245. Preop for his tonsillectomy. He appreciated the call.

## 2023-08-24 ENCOUNTER — Encounter
Admit: 2023-08-24 | Payer: PRIVATE HEALTH INSURANCE | Attending: Family | Primary: Student in an Organized Health Care Education/Training Program

## 2023-08-24 ENCOUNTER — Encounter
Admit: 2023-08-24 | Payer: PRIVATE HEALTH INSURANCE | Primary: Student in an Organized Health Care Education/Training Program

## 2023-08-24 ENCOUNTER — Ambulatory Visit
Admit: 2023-08-24 | Payer: BLUE CROSS/BLUE SHIELD | Primary: Student in an Organized Health Care Education/Training Program

## 2023-08-24 VITALS — BP 122/74 | HR 83 | Resp 15 | Ht 73.0 in | Wt 163.0 lb

## 2023-08-24 DIAGNOSIS — J039 Acute tonsillitis, unspecified: Secondary | ICD-10-CM

## 2023-08-24 DIAGNOSIS — Z23 Encounter for immunization: Secondary | ICD-10-CM

## 2023-08-24 DIAGNOSIS — D571 Sickle-cell disease without crisis: Secondary | ICD-10-CM

## 2023-08-24 DIAGNOSIS — D5700 Hb-SS disease with crisis, unspecified: Secondary | ICD-10-CM

## 2023-08-24 DIAGNOSIS — Z01818 Encounter for other preprocedural examination: Secondary | ICD-10-CM

## 2023-08-24 DIAGNOSIS — Z8616 History of COVID-19: Secondary | ICD-10-CM

## 2023-08-24 DIAGNOSIS — M87052 Idiopathic aseptic necrosis of left femur: Secondary | ICD-10-CM

## 2023-08-24 DIAGNOSIS — Z7964 On hydroxyurea therapy: Secondary | ICD-10-CM

## 2023-08-24 DIAGNOSIS — D6189 Other specified aplastic anemias and other bone marrow failure syndromes: Secondary | ICD-10-CM

## 2023-08-24 DIAGNOSIS — D7389 Other diseases of spleen: Secondary | ICD-10-CM

## 2023-08-24 DIAGNOSIS — G8929 Other chronic pain: Secondary | ICD-10-CM

## 2023-08-24 DIAGNOSIS — IMO0002 Dactylitis: Secondary | ICD-10-CM

## 2023-08-24 DIAGNOSIS — Z862 Personal history of diseases of the blood and blood-forming organs and certain disorders involving the immune mechanism: Secondary | ICD-10-CM

## 2023-08-24 DIAGNOSIS — K219 Gastro-esophageal reflux disease without esophagitis: Secondary | ICD-10-CM

## 2023-08-25 ENCOUNTER — Ambulatory Visit
Admit: 2023-08-25 | Payer: BLUE CROSS/BLUE SHIELD | Attending: Adult Health | Primary: Student in an Organized Health Care Education/Training Program

## 2023-08-25 ENCOUNTER — Telehealth
Admit: 2023-08-25 | Payer: PRIVATE HEALTH INSURANCE | Primary: Student in an Organized Health Care Education/Training Program

## 2023-08-25 ENCOUNTER — Telehealth
Admit: 2023-08-25 | Payer: PRIVATE HEALTH INSURANCE | Attending: Student in an Organized Health Care Education/Training Program | Primary: Student in an Organized Health Care Education/Training Program

## 2023-08-25 ENCOUNTER — Telehealth
Admit: 2023-08-25 | Payer: PRIVATE HEALTH INSURANCE | Attending: Family | Primary: Student in an Organized Health Care Education/Training Program

## 2023-08-25 DIAGNOSIS — H7293 Unspecified perforation of tympanic membrane, bilateral: Secondary | ICD-10-CM

## 2023-08-25 DIAGNOSIS — H6693 Otitis media, unspecified, bilateral: Secondary | ICD-10-CM

## 2023-08-25 DIAGNOSIS — J3501 Chronic tonsillitis: Secondary | ICD-10-CM

## 2023-08-25 NOTE — Telephone Encounter
 Call returned to Dr. Bernita Raisin.Discussed that Reep will receive 1 unit of blood on 2/22 in preparation for his surgery on Monday 08/28/2023.  Nadene Rubins, APRN

## 2023-08-25 NOTE — Telephone Encounter
 I made Phillip Bell aware  that we received a call from Dr. Nils Flack office that he was unaware of his scheduled blood transfusion in anticipation for his surgery on 08/28/23. He said he know he has a blood transfusion apt tomorrow at 12:45. I explained the importance of his coming for this blood transfusion prior to his surgery. He understands the plan.

## 2023-08-25 NOTE — Other
 [facility] PSE Note

## 2023-08-25 NOTE — Telephone Encounter
 Desamae calling in from Dr. Bernita Raisin for APRN or RNPatient is scheduled for surgury on Monday, 2/24/25Dr. Bernita Raisin is requesting a call back prior to 9a today, 2/21 or patient's surgery will be cancelled.  Dr. Nils Flack office number is [phone] Time Sensitive

## 2023-08-26 ENCOUNTER — Inpatient Hospital Stay
Admit: 2023-08-26 | Discharge: 2023-08-26 | Payer: BLUE CROSS/BLUE SHIELD | Primary: Student in an Organized Health Care Education/Training Program

## 2023-08-26 ENCOUNTER — Telehealth
Admit: 2023-08-26 | Payer: PRIVATE HEALTH INSURANCE | Attending: Student in an Organized Health Care Education/Training Program | Primary: Student in an Organized Health Care Education/Training Program

## 2023-08-26 VITALS — BP 105/63 | HR 73 | Temp 98.00000°F | Resp 18 | Wt 160.3 lb

## 2023-08-26 DIAGNOSIS — J0100 Acute maxillary sinusitis, unspecified: Secondary | ICD-10-CM

## 2023-08-26 DIAGNOSIS — D571 Sickle-cell disease without crisis: Secondary | ICD-10-CM

## 2023-08-26 MED ORDER — SODIUM CHLORIDE 0.9 % INTRAVENOUS SOLUTION
Freq: Once | INTRAVENOUS | Status: DC
Start: 2023-08-26 — End: 2023-08-31

## 2023-08-26 MED ORDER — SODIUM CHLORIDE 0.9 % (FLUSH) INJECTION SYRINGE
0.9 | INTRAVENOUS | Status: DC | PRN
Start: 2023-08-26 — End: 2023-08-31

## 2023-08-26 NOTE — Progress Notes
 Patient arrived A&Ox4.Patient arrived for 1x unit RBC.RN placed RPIV w/ +BR, Type & Screen drawn (RN later told by blood bank that patient did not need type & screen per blood drawn on 2/18). Patients RPIV ended up failing so RPIV removed w/ bandage placed on site. RN then placed LPIV w/ +BR for blood administration.Patient given 1x unit of RBC (NO pre-medications needed) per tx protocols.Patients LPIV flushed w/ +BR, removed w/ bandage placed on site. Patient able to ambulate independently and appears comfortable @ end of Tx. Patient is aware of upcoming appt.

## 2023-08-26 NOTE — Telephone Encounter
 Pt called to ask for him to arrive at clinic today earlier then 12:45 due to him needing an active T&S drawn for his transfusion today. His current T&S is expired. Pt stated he would try to come earlier. Directions on how to get to clinic given to pt.

## 2023-08-28 ENCOUNTER — Ambulatory Visit
Admit: 2023-08-28 | Payer: BLUE CROSS/BLUE SHIELD | Attending: Adult Health | Primary: Student in an Organized Health Care Education/Training Program

## 2023-08-28 ENCOUNTER — Encounter
Admit: 2023-08-28 | Payer: PRIVATE HEALTH INSURANCE | Attending: Otolaryngology | Primary: Student in an Organized Health Care Education/Training Program

## 2023-08-28 ENCOUNTER — Inpatient Hospital Stay
Admit: 2023-08-28 | Discharge: 2023-08-28 | Payer: BLUE CROSS/BLUE SHIELD | Attending: Otolaryngology | Primary: Student in an Organized Health Care Education/Training Program

## 2023-08-28 DIAGNOSIS — H7293 Unspecified perforation of tympanic membrane, bilateral: Secondary | ICD-10-CM

## 2023-08-28 DIAGNOSIS — Z8616 Personal history of COVID-19: Secondary | ICD-10-CM

## 2023-08-28 DIAGNOSIS — J3501 Chronic tonsillitis: Secondary | ICD-10-CM

## 2023-08-28 DIAGNOSIS — H73893 Other specified disorders of tympanic membrane, bilateral: Secondary | ICD-10-CM

## 2023-08-28 DIAGNOSIS — Z7964 On hydroxyurea therapy: Secondary | ICD-10-CM

## 2023-08-28 DIAGNOSIS — D7389 Other diseases of spleen: Secondary | ICD-10-CM

## 2023-08-28 DIAGNOSIS — M87052 Idiopathic aseptic necrosis of left femur: Secondary | ICD-10-CM

## 2023-08-28 DIAGNOSIS — H6693 Otitis media, unspecified, bilateral: Secondary | ICD-10-CM

## 2023-08-28 DIAGNOSIS — G8929 Other chronic pain: Secondary | ICD-10-CM

## 2023-08-28 DIAGNOSIS — D571 Sickle-cell disease without crisis: Secondary | ICD-10-CM

## 2023-08-28 DIAGNOSIS — D5700 Hb-SS disease with crisis, unspecified: Secondary | ICD-10-CM

## 2023-08-28 DIAGNOSIS — K219 Gastro-esophageal reflux disease without esophagitis: Secondary | ICD-10-CM

## 2023-08-28 DIAGNOSIS — Z91048 Other nonmedicinal substance allergy status: Secondary | ICD-10-CM

## 2023-08-28 DIAGNOSIS — D759 Disease of blood and blood-forming organs, unspecified: Secondary | ICD-10-CM

## 2023-08-28 DIAGNOSIS — J351 Hypertrophy of tonsils: Secondary | ICD-10-CM

## 2023-08-28 DIAGNOSIS — H6990 Unspecified Eustachian tube disorder, unspecified ear: Secondary | ICD-10-CM

## 2023-08-28 DIAGNOSIS — D6189 Other specified aplastic anemias and other bone marrow failure syndromes: Secondary | ICD-10-CM

## 2023-08-28 DIAGNOSIS — IMO0002 Dactylitis: Secondary | ICD-10-CM

## 2023-08-28 DIAGNOSIS — Z23 Encounter for immunization: Secondary | ICD-10-CM

## 2023-08-28 DIAGNOSIS — Z862 Personal history of diseases of the blood and blood-forming organs and certain disorders involving the immune mechanism: Secondary | ICD-10-CM

## 2023-08-28 DIAGNOSIS — Z79899 Other long term (current) drug therapy: Secondary | ICD-10-CM

## 2023-08-28 MED ORDER — OXYCODONE (ROXICODONE) IMMEDIATE RELEASE 2.5 MG HALFTAB
2.5 | ORAL | Status: DC | PRN
Start: 2023-08-28 — End: 2023-08-28
  Administered 2023-08-28: 10:00:00 2.5 mg via ORAL

## 2023-08-28 MED ORDER — ROCURONIUM 10 MG/ML INTRAVENOUS SOLUTION
10 | INTRAVENOUS | Status: DC | PRN
Start: 2023-08-28 — End: 2023-08-28
  Administered 2023-08-28 (×3): 10 mg/mL via INTRAVENOUS

## 2023-08-28 MED ORDER — FENTANYL (PF) 50 MCG/ML INJECTION SOLUTION
50 | Status: CP
Start: 2023-08-28 — End: ?

## 2023-08-28 MED ORDER — SODIUM CHLORIDE 0.9 % IRRIGATION SOLUTION
0.9 | Status: CP | PRN
Start: 2023-08-28 — End: ?
  Administered 2023-08-28: 08:00:00 0.9 % irrigation

## 2023-08-28 MED ORDER — FENTANYL (PF) 50 MCG/ML INJECTION SOLUTION
50 | INTRAVENOUS | Status: DC | PRN
Start: 2023-08-28 — End: 2023-08-28

## 2023-08-28 MED ORDER — NALOXONE 0.4 MG/ML INJECTION SOLUTION
0.4 | INTRAVENOUS | Status: DC | PRN
Start: 2023-08-28 — End: 2023-08-28

## 2023-08-28 MED ORDER — LIDOCAINE (PF) 20 MG/ML (2 %) INJECTION SOLUTION
202 | INTRAVENOUS | Status: DC | PRN
Start: 2023-08-28 — End: 2023-08-28
  Administered 2023-08-28: 07:00:00 202 mg/mL (2 %) via INTRAVENOUS

## 2023-08-28 MED ORDER — PROPOFOL 10 MG/ML INTRAVENOUS EMULSION
10 | Status: CP
Start: 2023-08-28 — End: ?

## 2023-08-28 MED ORDER — FENTANYL (PF) 50 MCG/ML INJECTION SOLUTION
50 | INTRAVENOUS | Status: DC | PRN
Start: 2023-08-28 — End: 2023-08-28
  Administered 2023-08-28 (×3): 50 mcg/mL via INTRAVENOUS

## 2023-08-28 MED ORDER — ONDANSETRON HCL (PF) 4 MG/2 ML INJECTION SOLUTION
42 | INTRAVENOUS | Status: DC | PRN
Start: 2023-08-28 — End: 2023-08-28

## 2023-08-28 MED ORDER — CEFAZOLIN 1 GRAM SOLUTION FOR INJECTION
1 | Status: CP
Start: 2023-08-28 — End: ?

## 2023-08-28 MED ORDER — SODIUM CHLORIDE 0.9 % (FLUSH) INJECTION SYRINGE
0.9 | Freq: Three times a day (TID) | INTRAVENOUS | Status: DC
Start: 2023-08-28 — End: 2023-08-28

## 2023-08-28 MED ORDER — HYDROMORPHONE (PF) 0.2 MG/ML INJECTION SYRINGE
0.2 | INTRAVENOUS | Status: DC | PRN
Start: 2023-08-28 — End: 2023-08-28
  Administered 2023-08-28: 10:00:00 0.2 mL via INTRAVENOUS

## 2023-08-28 MED ORDER — OFLOXACIN 0.3 % EAR DROPS
0.3 | Freq: Three times a day (TID) | OTIC | 1 refills | Status: AC
Start: 2023-08-28 — End: 2023-08-28

## 2023-08-28 MED ORDER — PROPOFOL 10 MG/ML INTRAVENOUS EMULSION
10 | INTRAVENOUS | Status: DC | PRN
Start: 2023-08-28 — End: 2023-08-28
  Administered 2023-08-28: 07:00:00 10 mL/h via INTRAVENOUS

## 2023-08-28 MED ORDER — CIPROFLOXACIN 0.2 % EAR DROPS IN A DROPPERETTE
0.2 | Status: DC | PRN
Start: 2023-08-28 — End: 2023-08-28
  Administered 2023-08-28: 09:00:00 0.2 %

## 2023-08-28 MED ORDER — MIDAZOLAM 1 MG/ML INJECTION SOLUTION
1 | INTRAVENOUS | Status: DC | PRN
Start: 2023-08-28 — End: 2023-08-28
  Administered 2023-08-28: 07:00:00 1 mg/mL via INTRAVENOUS

## 2023-08-28 MED ORDER — OFLOXACIN 0.3 % EAR DROPS
0.3 | Freq: Three times a day (TID) | OTIC | 2 refills | Status: AC
Start: 2023-08-28 — End: ?

## 2023-08-28 MED ORDER — EPINEPHRINE 1 MG/ML (1 ML) INJECTION SOLUTION
11 | Status: DC | PRN
Start: 2023-08-28 — End: 2023-08-28
  Administered 2023-08-28: 08:00:00 11 mg/mL ( mL) via TOPICAL

## 2023-08-28 MED ORDER — BISMUTH SUBGALLATE (BULK) POWDER
Status: DC | PRN
Start: 2023-08-28 — End: 2023-08-28
  Administered 2023-08-28: 09:00:00 via TOPICAL

## 2023-08-28 MED ORDER — ONDANSETRON HCL (PF) 4 MG/2 ML INJECTION SOLUTION
42 | Status: CP
Start: 2023-08-28 — End: ?

## 2023-08-28 MED ORDER — ROCURONIUM 10 MG/ML INTRAVENOUS SOLUTION
10 | Status: CP
Start: 2023-08-28 — End: ?

## 2023-08-28 MED ORDER — DIPHENHYDRAMINE 50 MG/ML INJECTION (WRAPPED E-RX)
50 | INTRAVENOUS | Status: DC | PRN
Start: 2023-08-28 — End: 2023-08-28

## 2023-08-28 MED ORDER — SUGAMMADEX 100 MG/ML INTRAVENOUS SOLUTION
100 | Status: CP
Start: 2023-08-28 — End: ?

## 2023-08-28 MED ORDER — HYDROMORPHONE 2 MG/ML INJECTION SOLUTION
2 | Status: CP
Start: 2023-08-28 — End: ?

## 2023-08-28 MED ORDER — OXYCODONE-ACETAMINOPHEN 5 MG-325 MG TABLET
5-325 | ORAL_TABLET | Freq: Four times a day (QID) | ORAL | 1 refills | Status: AC | PRN
Start: 2023-08-28 — End: ?

## 2023-08-28 MED ORDER — CHLORHEXIDINE GLUCONATE 0.12 % MOUTHWASH
0.12 | Freq: Once | OROMUCOSAL | Status: CP
Start: 2023-08-28 — End: ?
  Administered 2023-08-28: 06:00:00 0.12 mL via OROMUCOSAL

## 2023-08-28 MED ORDER — PROPOFOL 10 MG/ML INTRAVENOUS EMULSION
10 | INTRAVENOUS | Status: DC | PRN
Start: 2023-08-28 — End: 2023-08-28
  Administered 2023-08-28: 07:00:00 10 mg/mL via INTRAVENOUS

## 2023-08-28 MED ORDER — ONDANSETRON HCL (PF) 4 MG/2 ML INJECTION SOLUTION
42 | INTRAVENOUS | Status: DC | PRN
Start: 2023-08-28 — End: 2023-08-28
  Administered 2023-08-28: 08:00:00 42 mg/2 mL via INTRAVENOUS

## 2023-08-28 MED ORDER — CHLORHEXIDINE GLUCONATE 2 % TOWELETTE
2 | Freq: Once | TOPICAL | Status: DC
Start: 2023-08-28 — End: 2023-08-28

## 2023-08-28 MED ORDER — OXYCODONE-ACETAMINOPHEN 5 MG-325 MG TABLET
5-325 | ORAL_TABLET | Freq: Four times a day (QID) | ORAL | 1 refills | Status: AC | PRN
Start: 2023-08-28 — End: 2023-08-28

## 2023-08-28 MED ORDER — GLYCOPYRROLATE 0.2 MG/ML INJECTION SOLUTION
0.2 | INTRAVENOUS | Status: DC | PRN
Start: 2023-08-28 — End: 2023-08-28
  Administered 2023-08-28: 08:00:00 0.2 mg/mL via INTRAVENOUS

## 2023-08-28 MED ORDER — LACTATED RINGERS INTRAVENOUS SOLUTION
INTRAVENOUS | Status: DC | PRN
Start: 2023-08-28 — End: 2023-08-28
  Administered 2023-08-28: 07:00:00 via INTRAVENOUS

## 2023-08-28 MED ORDER — SODIUM CHLORIDE 0.9 % (FLUSH) INJECTION SYRINGE
0.9 | INTRAVENOUS | Status: DC | PRN
Start: 2023-08-28 — End: 2023-08-28

## 2023-08-28 MED ORDER — MIDAZOLAM 1 MG/ML INJECTION SOLUTION
1 | Status: CP
Start: 2023-08-28 — End: ?

## 2023-08-28 MED ORDER — DEXAMETHASONE SODIUM PHOSPHATE (PF) 10 MG/ML INJECTION SOLUTION
10 | INTRAVENOUS | Status: DC | PRN
Start: 2023-08-28 — End: 2023-08-28
  Administered 2023-08-28: 08:00:00 10 mg/mL via INTRAVENOUS

## 2023-08-28 MED ORDER — CEFAZOLIN 1 GRAM SOLUTION FOR INJECTION
1 | INTRAVENOUS | Status: DC | PRN
Start: 2023-08-28 — End: 2023-08-28
  Administered 2023-08-28: 08:00:00 1 gram via INTRAVENOUS

## 2023-08-28 MED ORDER — SUGAMMADEX 100 MG/ML INTRAVENOUS SOLUTION
100 | INTRAVENOUS | Status: DC | PRN
Start: 2023-08-28 — End: 2023-08-28
  Administered 2023-08-28 (×2): 100 mg/mL via INTRAVENOUS

## 2023-08-28 NOTE — Anesthesia Post-Procedure Evaluation
 Anesthesia Post-op NotePatient: Phillip RiceProcedure(s):  Procedure(s) (LRB):bilateral ventilating tube placement, and tonsillectomy (Bilateral)TYMPANOSTOMY (REQUIRING INSERTION, VENTILATING TUBE), GENERAL ANESTHESIA (Bilateral)TONSILLECTOMY, PRIMARY/SECONDARY; < AGE 29 (Bilateral) Last Vitals:  I have reviewed the post-operative vital signs as noted in the Epic chart.POSTOP EVALUATION:      Patient Recovery Location:  PACU     Vital Signs Status:  Stable     Patient Participation:  Patient participated     Mental Status:  Awake     Respiratory Status:  Acceptable     Airway Patency:  Patent     Cardiovascular/Hydration Status:  Stable     Pain Management:  Satisfactory to patient     Nausea/Vomiting Status:  Satisfactory to patientNo notable events documented.

## 2023-08-28 NOTE — Anesthesia Pre-Procedure Evaluation
 This is a 28 y.o. male scheduled for bilateral ventilating tube placement, and tonsillectomy (Bilateral)TYMPANOSTOMY (REQUIRING INSERTION, VENTILATING TUBE), GENERAL ANESTHESIA (Bilateral).Review of Systems/ Medical History Patient summary, nursing notes, EKG/Cardiac Studies , Labs, pre-procedure vitals, height, weight and NPO status reviewed.No previous anesthesia concernsAnesthesia Evaluation:   Estimated body mass index.08/28/23 : 20.56 kg/m? Last patient weight recorded. 08/28/23 : 71.7 kg Last patient height recorded. 08/28/23 : 6' 1.5 (1.867 m) Respiratory: -Airway Infections:   Other airway infections noted: pneumonia.Gastrointestinal/Genitourinary: Gastrointestinal Disorders:  Patient has GERD.Hematological/Lymphatic: -Anemia: Patient has anemia.  -Coagulopathy:  Patient has blood dyscrasia.Physical ExamCardiovascular:    normal exam  Pulmonary:  normal exam  Airway:  Mallampati: IITM distance: >3 FBNeck ROM: fullMouth Opening: >3cmDental:  unremarkable  Anesthesia PlanASA 3 The primary anesthesia plan is  general. Anesthesia informed consent obtained. Consent obtained from: patientUse of blood products: consented  The post operative pain plan is IV analgesics.Opioid administration likely.Anesthesiologist's Pre Op NoteI personally evaluated and examined the patient prior to the intra-operative phase of care on the day of the procedure.Marland Kitchen

## 2023-08-28 NOTE — Discharge Instructions
 Post-operative Care and Discharge InstructionsProcedure: you were seen for eustachian tube dysfunction and recurrent tonsillitis, for which your surgical team performed ear tubes and tonsillectomy. The procedure went well, there were no complications, and we are pleased with the result.Expectations: it is normal to have a hoarse voice after surgery because of the tube that was down your throat to help you breathe. This will most likely resolve with time. If you have minor blood-tinged saliva, this is normal and expected because of the procedure we performed. This will most likely resolve with time. If it does not or starts increasing in severity, please call the office and/or come to the Emergency Department to be evaluated. You can expect bad breath, a sore throat, and a sensation of mild throat swelling -- this is all normal and expected.Pain management: it is normal for your incision and surgical site to have soreness and pain after surgery. For post-operative pain, we recommend you Acetaminophen (Tylenol), which work best when taken consistently (e.g. Tylenol at 6am, 12pm, 6pm, 12am OR at 9am, 3pm, 9pm, 3am). For severe pain not controlled with these medications, please take Percocet, which is an opioid medication mixed with Tylenol. Do NOT take this in addition to your sickle cell pain medication, as they can interact. Do not exceed 4g or 4000mg  Tylenol per day.  Please do not drive or operate machinery while taking this medication. It can also cause constipation, so please take stool softeners and stay hydrated to avoid straining after surgery. Please avoid any blood-thinning medications, such as Ibuprofen (Motrin/Advil/Aleve), Eliquis, Xarelto, Brillinta, etc until cleared by your surgical team to restart.Dietary restrictions: you may resume your normal diet and eat whatever you are able to tolerate. It may be helpful to start with a softer diet for a few days. More important than eating is remaining hydrated and keeping the mouth moist with frequent drinking, ice chips, etc.Activity restrictions: please avoid straining, heavy lifting (more than 15lbs), and vigorous activity until your follow-up appointment. This will help avoid any injury to your incision and surgical site. This includes taking plenty of stool softeners and remaining hydrated to avoid straining. You can participate in light activity, such as walking.Return precautions: please call the office and/or come to the Emergency Department for evaluation if you experience high fever (101.4 F or above), bleeding, difficulty breathing, inability to drink fluids, severe pain, or other concerning symptoms. We are happy to answer any questions and evaluate you as needed.

## 2023-08-28 NOTE — Other
 Relevant PMHxSickle cell disease (hemoglobin S-S); GERDRelevant PSHxTympanostomy tube placementHPIMitchel Bell is a 28 y.o. male with hx ETD and adenotonsillar hypertrophy, scheduled today for bilateral myringotomy, tympanostomy tube placement, and tonsillectomy with Dr. Bernita Raisin.Pre-op H&P UpdateSubsequent to admission for surgery or invasive procedure, I have reassessed the patient by examination and review of relevant data pertaining to the planned procedure. I have verified the planned procedure and there are no relevant changes since the H&P.ROSNegative except for HPIVital signsTemp:  [98 ?F (36.7 ?C)] 98 ?F (36.7 ?C)Pulse:  [72] 72Resp:  [20] 20BP: (133)/(78) 133/78SpO2:  [99 %] 99 %Device (Oxygen Therapy): room airPhysical examinationCardiac: regular rate and rhythm.Pulmonary: clear to auscultation bilaterally.Plan- NPO since MN- Informed consent obtained, up to date, and bedside- Surgical site bilateral- Proceed to OR as [name], MDChief Resident of Otorhinolaryngology - Head and Neck [facility] HospitalMHB: [phone] Head and Neck Cancer Floor Pager: [phone] Inpatient/Pediatric Consult Pager: [phone] [place]/ED Consult Pager: [phone] Inpatient/Consult Pager: [phone]

## 2023-08-28 NOTE — Other
 Operative Diagnosis:Pre-op:   Chronic tonsillitis [J35.01]Bilateral otitis media with spontaneous rupture of eardrum [H66.93, H72.93]Hb-SS disease without crisis (HC Code) [D57.1] Patient Coded Diagnosis   Pre-op diagnosis: Chronic tonsillitis, Bilateral otitis media with spontaneous rupture of eardrum, Hb-SS disease without crisis (HC Code)  Post-op diagnosis: Chronic tonsillitis, Bilateral otitis media with spontaneous rupture of eardrum, Hb-SS disease without crisis (HC Code)  Patient Diagnosis   Pre-op diagnosis: Chronic tonsillitis [J35.01], Bilateral otitis media with spontaneous rupture of eardrum [H66.93, H72.93], Hb-SS disease without crisis (HC Code) [D57.1]  Post-op diagnosis: Chronic tonsillitis [474.00.ICD-9-CM], Bilateral otitis media with spontaneous rupture of eardrum [359438], Hb-SS disease without crisis (HC Code) [282.61.ICD-9-CM]    Post-op diagnosis:   * Chronic tonsillitis [J35.01]   * Bilateral otitis media with spontaneous rupture of eardrum [H66.93, H72.93]   * Hb-SS disease without crisis (HC Code) [D57.1]Operative Procedure(s) :Procedure(s) (LRB):bilateral ventilating tube placement, and tonsillectomy (Bilateral)TYMPANOSTOMY (REQUIRING INSERTION, VENTILATING TUBE), GENERAL ANESTHESIA (Bilateral)TONSILLECTOMY, PRIMARY/SECONDARY; < AGE 5 (Bilateral)Post-op Procedure & Diagnosis ConfirmationPost-op Diagnosis: Post-op Diagnosis confirmed (no changes)Post-op Procedure: Post-op Procedure confirmed (no changes)

## 2023-08-28 NOTE — Other
 Post Anesthesia Transfer of Care NotePatient: Phillip RiceProcedure(s) Performed: Procedure(s) (LRB):bilateral ventilating tube placement, and tonsillectomy (Bilateral)TYMPANOSTOMY (REQUIRING INSERTION, VENTILATING TUBE), GENERAL ANESTHESIA (Bilateral)TONSILLECTOMY, PRIMARY/SECONDARY; < AGE 29 (Bilateral)Last Vitals: I have reviewed the post-operative vital signs during the handoff as noted in the Epic chart.POSTOP HANDOFF :      Patient Location:  PACU     Level of Consciousness:  Awake     VS stable since last recorded intra-op set? Yes       Oxygen source: maskPatient co-morbidities, intra-operative course, intake & output and antibiotics as per Anesthesia record were discussed with the RN.

## 2023-09-08 ENCOUNTER — Encounter
Admit: 2023-09-08 | Payer: PRIVATE HEALTH INSURANCE | Attending: Medical Oncology | Primary: Student in an Organized Health Care Education/Training Program

## 2023-09-08 ENCOUNTER — Telehealth
Admit: 2023-09-08 | Payer: PRIVATE HEALTH INSURANCE | Attending: Pediatric Hematology-Oncology | Primary: Student in an Organized Health Care Education/Training Program

## 2023-09-08 DIAGNOSIS — D571 Sickle-cell disease without crisis: Secondary | ICD-10-CM

## 2023-09-08 MED ORDER — OXYCODONE IMMEDIATE RELEASE 15 MG TABLET
15 | ORAL_TABLET | ORAL | 1 refills | Status: AC | PRN
Start: 2023-09-08 — End: ?

## 2023-09-08 NOTE — Telephone Encounter
 Patient would like a medication refill for - oxyCODONE (ROXICODONE) 15 mg Immediate Release tablet[id number] - [place], [place] - [address] [place] [id number]: [phone] Fax: [phone]

## 2023-09-08 NOTE — Telephone Encounter
 Refill for oxycodone electronically sent to Pharmacy

## 2023-09-12 ENCOUNTER — Encounter
Admit: 2023-09-12 | Payer: PRIVATE HEALTH INSURANCE | Attending: Family | Primary: Student in an Organized Health Care Education/Training Program

## 2023-09-12 DIAGNOSIS — D571 Sickle-cell disease without crisis: Secondary | ICD-10-CM

## 2023-09-14 NOTE — Other
 [facility]OSPITALOPERATIVE REPORTCONFIDENTIAL - DO NOT COPY WITHOUT APPROPRIATE AUTHORIZATIONNAME: Clifford RiceDATE OF BIRTH: 1997/09/26DATE OF PROCEDURE:PREOPERATIVE DIAGNOSIS:Tonsillar HypertrophyPOSTOPERATIVE DIAGNOSIS:SamePROCEDURE:Bilateral myringotomy, tympanostomy tube placementBilateral tonsillectomySURGEON:Zoeann Mol, M.D.ASSISTANT:Daniel Dierdre Searles, M.D.ANESTHESIA:GETAFINDINGS:Right ear: posterior inferior retraction pocket, no effusionLeft ear: anterior inferior retraction pocket, no effusion3+ tonsilsCOMPLICATIONS:NoneSPECIMENS:Left and right tonsilsESTIMATED BLOOD LOSS: 15 ccINDICATIONS FOR PROCEDURE: Phillip Bell is a 28 y.o. male with eustachian tube dysfunction and recurrent tonsillitisDESCRIPTION OF PROCEDURE: Once informed consent was obtained, the patient was brought to the operating room and placed on the operating table in a supine position. A brief timeout was performed and general anesthesia was induced without complications. The patient was then prepped and draped in the standard sterile surgical fashion and a surgical pause was performed.Using a microscope, the right ear was first evaluated. The EAC was clear. There was a retraction pocket seen postero-inferiorly. A myringotomy blade was used to make an incision in the anterior-inferior quadrant and a titanium reuter bobbin tube was placed. Drops were instilled after hemostasis was ensured.Next, the left ear was first evaluated. The EAC was clear. There was a retraction pocket seen postero-inferiorly. A myringotomy blade was used to make an incision in the anterior-inferior quadrant and a titanium reuter bobbin tube was placed. Drops were instilled after hemostasis was ensured.The bed was then turned 90 degrees counter clockwiseA shoulder roll was placed under the patient's shoulders. A McIvor mouth gag was inserted and the pharynx was visualized. A curved Allis was used to retract the right tonsil from the tonsillar fossa. Using a bovie, an incision was made along the medial border of the anterior tonsillar pillar.  The tonsil was removed in a superior to inferior fashion within the plane of the tonsillar capsule.  Next, turning to the left tonsil, it was retracted from the tonsillar fossa with a curved Allis.  Again, in the tonsil was removed in an superior to inferior fashion. During dissection, the suction bovie was used to cauterize vessels and areas of bleeding. Val salva was performed without bleeding seen at pressure of 30 mm Hg. Bismuth was applied to bilateral tonsillar fossae.The McIvor mouth gag was removed and the patient turned back to anesthesia. The patient was then extubated without complications and taken to PACU in stable condition.All sponge, sharp, and instrument counts were correct at the end of the case, and wanding revealed no retained sponges.  There were no complications and the patient tolerated the procedure well. Dr. Bernita Raisin was scrubbed and present for the entirety of the case.

## 2023-09-18 ENCOUNTER — Telehealth
Admit: 2023-09-18 | Payer: PRIVATE HEALTH INSURANCE | Attending: Family | Primary: Student in an Organized Health Care Education/Training Program

## 2023-09-18 ENCOUNTER — Encounter
Admit: 2023-09-18 | Payer: PRIVATE HEALTH INSURANCE | Attending: Family | Primary: Student in an Organized Health Care Education/Training Program

## 2023-09-18 DIAGNOSIS — D571 Sickle-cell disease without crisis: Secondary | ICD-10-CM

## 2023-09-18 MED ORDER — OXYCODONE IMMEDIATE RELEASE 15 MG TABLET
15 | ORAL_TABLET | ORAL | 1 refills | Status: AC | PRN
Start: 2023-09-18 — End: ?

## 2023-09-18 MED ORDER — OXYCONTIN 15 MG TABLET,CRUSH RESISTANT,EXTENDED RELEASE
15 | ORAL_TABLET | Freq: Two times a day (BID) | ORAL | 1 refills | Status: AC
Start: 2023-09-18 — End: ?

## 2023-09-18 NOTE — Telephone Encounter
 Patient called in to request refill on  : OXYCONTIN 15 mg 12 hr extended release tablet to be sent to : [id number] - [place], [place] - [address] Patient is out of medication

## 2023-09-19 ENCOUNTER — Encounter
Admit: 2023-09-19 | Payer: PRIVATE HEALTH INSURANCE | Primary: Student in an Organized Health Care Education/Training Program

## 2023-09-19 ENCOUNTER — Inpatient Hospital Stay
Admit: 2023-09-19 | Discharge: 2023-09-19 | Payer: BLUE CROSS/BLUE SHIELD | Primary: Student in an Organized Health Care Education/Training Program

## 2023-09-19 ENCOUNTER — Ambulatory Visit
Admit: 2023-09-19 | Payer: BLUE CROSS/BLUE SHIELD | Primary: Student in an Organized Health Care Education/Training Program

## 2023-09-19 ENCOUNTER — Telehealth
Admit: 2023-09-19 | Payer: PRIVATE HEALTH INSURANCE | Attending: Family | Primary: Student in an Organized Health Care Education/Training Program

## 2023-09-19 VITALS — BP 124/71 | HR 87 | Temp 98.30000°F | Ht 73.5 in

## 2023-09-19 DIAGNOSIS — L299 Pruritus, unspecified: Secondary | ICD-10-CM

## 2023-09-19 DIAGNOSIS — Z7251 High risk heterosexual behavior: Principal | ICD-10-CM

## 2023-09-19 NOTE — Telephone Encounter
 Nurse Triage NoteMitchel Bell reports concerns for STIs. Patient states he has been sexually active with a new partner for a few weeks and since has experienced slight genital itching. Patient wants to be tests for STIs. Patient denies rash to genital area. Denies fevers. Denies chest pain / SOB / Palpitations. Patient states he is unsure if his partner has any known STIs.Callback instructions and ED precautions reviewed with patient. Care advice discussed, discussed signs and symptoms that would require a higher level of care. Patient verbalizes understanding.DispositionPatient triaged using Schmitt-Thompson, per protocol patient advised to be seen within 3 days, appointment scheduled with covering provider due to no availability with PCP.Care advice provided to the patient. Advised the patient to call back with any new or worsening symptoms. Patient verbalizes understanding.Escalation required: NoReason for Disposition Patient wants to be seenProtocols used: Itching - Localized-Adult-OH

## 2023-09-19 NOTE — Telephone Encounter
 Patient calling-- pharmacy advised him OXYCONTIN 15 mg 12 hr extended release tablet  needs a new prior authorization. Please advise.

## 2023-09-19 NOTE — Telephone Encounter
 I made pt aware his PAR is good until 03/2024. He can cal and f/u with the pharmacy. He appreciated the RC.

## 2023-09-20 LAB — CHLAMYDIA TRACHOMATIS, NAAT (LAB ORDER ONLY) (BH GH L LMW YH): BKR CHLAMYDIA, DNA PROBE: NEGATIVE

## 2023-09-20 LAB — TRICHOMONAS VAGINALIS BY NAAT: BKR TRICHOMONAS VAGINALIS NAAT: NEGATIVE

## 2023-09-20 LAB — NEISSERIA GONORRHEA, NAAT (LAB ORDER ONLY)   (BH GH L LMW YH): BKR NEISSERIA GONORRHOEAE, DNA PROBE: NEGATIVE

## 2023-09-26 ENCOUNTER — Encounter
Admit: 2023-09-26 | Payer: PRIVATE HEALTH INSURANCE | Attending: Family | Primary: Student in an Organized Health Care Education/Training Program

## 2023-10-01 ENCOUNTER — Encounter
Admit: 2023-10-01 | Payer: PRIVATE HEALTH INSURANCE | Attending: Family | Primary: Student in an Organized Health Care Education/Training Program

## 2023-10-03 MED ORDER — IBUPROFEN 600 MG TABLET
600 | ORAL_TABLET | 2 refills | Status: AC
Start: 2023-10-03 — End: ?

## 2023-10-11 ENCOUNTER — Ambulatory Visit
Admit: 2023-10-11 | Payer: PRIVATE HEALTH INSURANCE | Primary: Student in an Organized Health Care Education/Training Program

## 2023-10-11 ENCOUNTER — Encounter
Admit: 2023-10-11 | Payer: PRIVATE HEALTH INSURANCE | Attending: Family | Primary: Student in an Organized Health Care Education/Training Program

## 2023-10-11 DIAGNOSIS — D571 Sickle-cell disease without crisis: Principal | ICD-10-CM

## 2023-10-11 MED ORDER — OXYCODONE IMMEDIATE RELEASE 15 MG TABLET
15 | ORAL_TABLET | ORAL | 1 refills | Status: AC | PRN
Start: 2023-10-11 — End: ?

## 2023-10-11 MED ORDER — OXYCONTIN 15 MG TABLET,CRUSH RESISTANT,EXTENDED RELEASE
15 | ORAL_TABLET | Freq: Two times a day (BID) | ORAL | 1 refills | Status: AC
Start: 2023-10-11 — End: ?

## 2023-10-26 ENCOUNTER — Encounter
Admit: 2023-10-26 | Payer: PRIVATE HEALTH INSURANCE | Primary: Student in an Organized Health Care Education/Training Program

## 2023-11-03 ENCOUNTER — Encounter
Admit: 2023-11-03 | Payer: PRIVATE HEALTH INSURANCE | Attending: Family | Primary: Student in an Organized Health Care Education/Training Program

## 2023-11-06 ENCOUNTER — Encounter
Admit: 2023-11-06 | Payer: PRIVATE HEALTH INSURANCE | Primary: Student in an Organized Health Care Education/Training Program

## 2023-11-07 ENCOUNTER — Telehealth
Admit: 2023-11-07 | Payer: PRIVATE HEALTH INSURANCE | Attending: Family | Primary: Student in an Organized Health Care Education/Training Program

## 2023-11-07 ENCOUNTER — Encounter
Admit: 2023-11-07 | Payer: PRIVATE HEALTH INSURANCE | Attending: Family | Primary: Student in an Organized Health Care Education/Training Program

## 2023-11-07 DIAGNOSIS — D571 Sickle-cell disease without crisis: Secondary | ICD-10-CM

## 2023-11-07 MED ORDER — HYDROXYUREA 500 MG CAPSULE
500 | ORAL_CAPSULE | Freq: Every day | ORAL | 2 refills | 30.00000 days | Status: AC
Start: 2023-11-07 — End: ?

## 2023-11-07 MED ORDER — OXYCONTIN 15 MG TABLET,CRUSH RESISTANT,EXTENDED RELEASE
15 | ORAL_TABLET | Freq: Two times a day (BID) | ORAL | 1 refills | 3.00000 days | Status: AC
Start: 2023-11-07 — End: ?

## 2023-11-07 MED ORDER — OXYCODONE IMMEDIATE RELEASE 15 MG TABLET
15 | ORAL_TABLET | ORAL | 1 refills | 3.00000 days | Status: AC | PRN
Start: 2023-11-07 — End: ?

## 2023-11-07 NOTE — Telephone Encounter
 Patient would like a medication refill - hydroxyurea  (HYDREA ) 500 mg capsule, oxyCODONE  (ROXICODONE ) 15 mg Immediate Release tablet and OXYCONTIN  15 mg 12 hr extended release tablet. He's having sickle cell pain. [id number] - [place], [place] - [address] [place] [id number]: [phone] Fax: [phone]

## 2023-11-13 ENCOUNTER — Encounter
Admit: 2023-11-13 | Payer: PRIVATE HEALTH INSURANCE | Attending: Student in an Organized Health Care Education/Training Program | Primary: Student in an Organized Health Care Education/Training Program

## 2023-11-13 MED ORDER — AMOXICILLIN 875 MG-POTASSIUM CLAVULANATE 125 MG TABLET
875-125 | ORAL_TABLET | Freq: Two times a day (BID) | ORAL | 1 refills | 7.00000 days | Status: AC
Start: 2023-11-13 — End: 2023-11-22

## 2023-11-15 ENCOUNTER — Ambulatory Visit
Admit: 2023-11-15 | Payer: PRIVATE HEALTH INSURANCE | Attending: Family | Primary: Student in an Organized Health Care Education/Training Program

## 2023-11-15 ENCOUNTER — Encounter
Admit: 2023-11-15 | Payer: PRIVATE HEALTH INSURANCE | Primary: Student in an Organized Health Care Education/Training Program

## 2023-11-15 NOTE — Progress Notes
 SOCIAL WORK NOTEPatient Name: Phillip RiceMedical Record Number: [id number] Date of Birth: 31-Jul-1997Medical Social Work Follow Up  [employer] Most Recent Value Admission Information  Document Type Progress Note Reason for Current Social Work Involvement Support/Coping, Resources Intervention Psycho-social Intervention Source of Information Patient Record Reviewed Yes Level of Care Ambulatory Psychosocial issues requiring intervention Support/coping Psychosocial interventions 10 minutes were spent face to face with Mr. Tierno.  Mr. Tagle reported he is feeling fine with no current concerns.  He shared that he recently purchase a two-bedroom, two bathroom townhouse and plans to move in on May 31. I congratulated him and wished him well on this new chapter.  I provided emotional support and active listening.  Mr. Kenley has my contact information and is aware of social work Proofreader Outpatient Sickle Cell Team Handoff Required? Yes Next Steps/Plan (including hand-off): No further social work intervention is indicated at this time. Please reconsult if additional needs arise. Signature: Fraser Jackson, LMSW Contact Information: [phone]

## 2023-11-17 ENCOUNTER — Telehealth
Admit: 2023-11-17 | Payer: PRIVATE HEALTH INSURANCE | Primary: Student in an Organized Health Care Education/Training Program

## 2023-11-17 ENCOUNTER — Emergency Department
Admit: 2023-11-17 | Payer: BLUE CROSS/BLUE SHIELD | Primary: Student in an Organized Health Care Education/Training Program

## 2023-11-17 ENCOUNTER — Inpatient Hospital Stay
Admit: 2023-11-17 | Discharge: 2023-11-21 | Payer: BLUE CROSS/BLUE SHIELD | Attending: Internal Medicine | Admitting: Student in an Organized Health Care Education/Training Program

## 2023-11-17 LAB — MANUAL DIFFERENTIAL
BKR WAM BASOPHIL - ABS (DIFF) 2 DEC: 0.1 x 1000/ÂµL (ref 0.00–1.00)
BKR WAM BASOPHILS (DIFF): 0.8 % (ref 0.0–1.4)
BKR WAM EOSINOPHILS (DIFF) 2 DEC: 0.56 x 1000/ÂµL (ref 0.00–1.00)
BKR WAM EOSINOPHILS (DIFF): 4.3 % (ref 0.0–5.0)
BKR WAM LYMPHOCYTE - ABS (DIFF) 2 DEC: 1.78 x 1000/ÂµL (ref 0.60–3.70)
BKR WAM LYMPHOCYTES (DIFF): 13.7 % — ABNORMAL LOW (ref 17.0–50.0)
BKR WAM MONOCYTE - ABS (DIFF) 2 DEC: 1.66 x 1000/ÂµL — ABNORMAL HIGH (ref 0.00–1.00)
BKR WAM MONOCYTES (DIFF): 12.8 % — ABNORMAL HIGH (ref 4.0–12.0)
BKR WAM MYELOCYTES (DIFF) 1 DEC: 3.4 % — ABNORMAL HIGH (ref 0.0–0.0)
BKR WAM NEUTROPHILS (DIFF): 65 % (ref 39.0–72.0)
BKR WAM NEUTROPHILS - ABS (DIFF) 2 DEC: 8.43 x 1000/ÂµL — ABNORMAL HIGH (ref 2.00–7.60)

## 2023-11-17 LAB — RETICULOCYTES
BKR WAM IRF: 55.9 % — ABNORMAL HIGH (ref 3.0–15.9)
BKR WAM RETICULOCYTE - ABS (3 DEC): 0.267 10Ë6 cells/uL — ABNORMAL HIGH (ref 0.023–0.140)
BKR WAM RETICULOCYTE COUNT PCT (1 DEC): 12.2 % — ABNORMAL HIGH (ref 0.6–2.7)
BKR WAM RETICULOCYTE HGB EQUIVALENT: 35.7 pg (ref 28.2–35.7)

## 2023-11-17 LAB — CBC WITH AUTO DIFFERENTIAL
BKR WAM ABSOLUTE NRBC (2 DEC): 1.11 x 1000/ÂµL — ABNORMAL HIGH (ref 0.00–1.00)
BKR WAM HEMATOCRIT (2 DEC): 20.9 % — ABNORMAL LOW (ref 38.50–50.00)
BKR WAM HEMOGLOBIN: 7.2 g/dL — ABNORMAL LOW (ref 13.2–17.1)
BKR WAM MCH (PG): 33 pg (ref 27.0–33.0)
BKR WAM MCHC: 34.4 g/dL (ref 31.0–36.0)
BKR WAM MCV: 95.9 fL (ref 80.0–100.0)
BKR WAM MPV: 9.1 fL (ref 8.0–12.0)
BKR WAM NUCLEATED RED BLOOD CELLS: 8.6 % — ABNORMAL HIGH (ref 0.0–1.0)
BKR WAM PLATELETS: 529 x1000/ÂµL — ABNORMAL HIGH (ref 150–420)
BKR WAM RDW-CV: 19.5 % — ABNORMAL HIGH (ref 11.0–15.0)
BKR WAM RED BLOOD CELL COUNT.: 2.18 M/ÂµL — ABNORMAL LOW (ref 4.00–6.00)
BKR WAM WHITE BLOOD CELL COUNT: 13 x1000/ÂµL — ABNORMAL HIGH (ref 4.0–11.0)

## 2023-11-17 LAB — BASIC METABOLIC PANEL
BKR ANION GAP: 11 (ref 7–17)
BKR BLOOD UREA NITROGEN: 11 mg/dL (ref 6–20)
BKR BUN / CREAT RATIO: 12.1 (ref 8.0–23.0)
BKR CALCIUM: 9.1 mg/dL (ref 8.8–10.2)
BKR CHLORIDE: 101 mmol/L (ref 98–107)
BKR CO2: 21 mmol/L (ref 20–30)
BKR CREATININE DELTA: 0.12
BKR CREATININE: 0.91 mg/dL (ref 0.40–1.30)
BKR EGFR, CREATININE (CKD-EPI 2021): >60 mL/min/{1.73_m2} (ref >=60)
BKR GLUCOSE: 119 mg/dL — ABNORMAL HIGH (ref 70–100)
BKR SODIUM: 133 mmol/L — ABNORMAL LOW (ref 136–144)

## 2023-11-17 LAB — HEPATIC FUNCTION PANEL
BKR A/G RATIO: 1.3 (ref 1.0–2.2)
BKR ALANINE AMINOTRANSFERASE (ALT): 49 U/L (ref 9–59)
BKR ALBUMIN: 4.5 g/dL (ref 3.6–5.1)
BKR ALKALINE PHOSPHATASE: 103 U/L (ref 9–122)
BKR BILIRUBIN TOTAL: 1.8 mg/dL — ABNORMAL HIGH (ref <=1.2)
BKR GLOBULIN: 3.4 g/dL (ref 2.0–3.9)
BKR PROTEIN TOTAL: 7.9 g/dL (ref 5.9–8.3)

## 2023-11-17 LAB — POTASSIUM: BKR POTASSIUM: 4 mmol/L (ref 3.3–5.3)

## 2023-11-17 MED ORDER — KETOROLAC 60 MG/2 ML INTRAMUSCULAR SOLUTION
60 | Freq: Four times a day (QID) | INTRAVENOUS | Status: DC | PRN
Start: 2023-11-17 — End: 2023-11-18
  Administered 2023-11-17 – 2023-11-18 (×2): 60 mL via INTRAVENOUS

## 2023-11-17 MED ORDER — HYDROMORPHONE 2 MG/ML INJECTION SOLUTION
2 | SUBCUTANEOUS | Status: DC | PRN
Start: 2023-11-17 — End: 2023-11-18
  Administered 2023-11-17: 18:00:00 2 mL via SUBCUTANEOUS

## 2023-11-17 MED ORDER — HYDROMORPHONE (DILAUDID) PCA 1 MG/ML (50 ML) YNH PYXIS
1 | INTRAVENOUS | Status: DC
Start: 2023-11-17 — End: 2023-11-18
  Administered 2023-11-17: 23:00:00 1 mL via INTRAVENOUS

## 2023-11-17 MED ORDER — HYDROMORPHONE 2 MG/ML INJECTION SOLUTION
2 | INTRAVENOUS | Status: CP | PRN
Start: 2023-11-17 — End: ?
  Administered 2023-11-17 (×2): 2 mL via INTRAVENOUS

## 2023-11-17 MED ORDER — SODIUM CHLORIDE 0.9 % INTRAVENOUS SOLUTION
INTRAVENOUS | Status: DC
Start: 2023-11-17 — End: 2023-11-21
  Administered 2023-11-17: 23:00:00 via INTRAVENOUS

## 2023-11-17 MED ORDER — SODIUM CHLORIDE 0.9 % INTRAVENOUS SOLUTION
INTRAVENOUS | Status: DC
Start: 2023-11-17 — End: 2023-11-18

## 2023-11-17 MED ORDER — MELATONIN 3 MG TABLET
3 | Freq: Every evening | ORAL | Status: DC | PRN
Start: 2023-11-17 — End: 2023-11-22

## 2023-11-17 MED ORDER — DEXTROSE 5 % AND 0.45 % SODIUM CHLORIDE INTRAVENOUS SOLUTION
INTRAVENOUS | Status: DC
Start: 2023-11-17 — End: 2023-11-18
  Administered 2023-11-17: 18:00:00 1000.000 mL/h via INTRAVENOUS

## 2023-11-17 MED ORDER — NALOXONE 0.4 MG/ML INJECTION SOLUTION
0.4 | INTRAVENOUS | Status: DC | PRN
Start: 2023-11-17 — End: 2023-11-22

## 2023-11-17 MED ORDER — ENOXAPARIN 40 MG/0.4 ML SUBCUTANEOUS SYRINGE
40 | SUBCUTANEOUS | Status: DC
Start: 2023-11-17 — End: 2023-11-22
  Administered 2023-11-18 – 2023-11-21 (×4): 40 mg/0.4 mL via SUBCUTANEOUS

## 2023-11-17 MED ORDER — SODIUM CHLORIDE 0.9 % (FLUSH) INJECTION SYRINGE
0.9 | Freq: Three times a day (TID) | INTRAVENOUS | Status: DC
Start: 2023-11-17 — End: 2023-11-22
  Administered 2023-11-18: 05:00:00 0.9 mL via INTRAVENOUS

## 2023-11-17 MED ORDER — SODIUM CHLORIDE 0.9 % (FLUSH) INJECTION SYRINGE
0.9 | INTRAVENOUS | Status: DC | PRN
Start: 2023-11-17 — End: 2023-11-22

## 2023-11-17 NOTE — ED Notes
 9:20 PM Nurse-Provider Rounding on Blue AdmissionsPrimary ED Provider:Is the Patient and Family Aware of Status: [x]  YesPlan of Care Discussed: [x]  Yes	Plan of Care Goals:Concern for Sepsis?     []  Yes	[x]  NoIf Yes, Interventions Initiated to Address Sepsis: []  Blood Cultures and Initial Lactate[]  Fluids[]  Antibiotics[]  Repeat Lactate

## 2023-11-17 NOTE — ED Notes
 5:42 PM 28 y/o male with c/o sickle cell crisis. Pt reports 20/10 back pain and reports no relief from his meds. Denies any other complaints at this time. Aox4, resp even and unlabored, and pending provider eval 6:37 PM IV placed, labs collected and sent, and pt resting in bed. Reporting 10/10 pain 6:49 PM Pt to scan

## 2023-11-17 NOTE — Telephone Encounter
 Patient call back # [phone] is having a pain crisis, pain is in back, torso, and buttocks. Pain started earlier, not a specific trigger today, but had been feeling a bit crummy earlier in the week. Denies SOB, cough, fever but it aches when he breathes which is normal for his pain crisis. No lightheadedness, headaches, numbness/tingling. He tooko xy 15mg  IR x2 and 1 oxycontin  ER in the morning, took them again around 3PM, and they did not really help his pain. He is currently in the ED. We discussed that he will be evaluated by the ED and given pain medications per his pain plan, and may be admitted if pain is not controlled with those interventions. Does not sound like there are clinical symptoms of acute chest. Will forward message to his sickle cell disease team as an FYI

## 2023-11-17 NOTE — Utilization Review (ED)
 Utilization Review from XsolisPatient Data:  Patient Name: Phillip Bell Age: 28 y.o. DOB: Feb 14, 1996	 MRN: [id number]	 Indicated Status: Inpatient Review Comments: Sickle cell pain crisis. Dilaudid  PCA ordered, IV Dilaudid  x3, Toradol  x1. IVF.

## 2023-11-17 NOTE — ED Notes
 9:19 PM Floor Handoff Telemetry: 	[]  Yes		[x]  NoCode Status:   [x]  Full		[]  DNR		[]  DNI		Other (specify):Safety Precautions: [x]  Fall Risk  []  Sitter   []  Restraints	[]  Suicidal	[]  None	Other (specify):Mentation/Orientation:	 A&O (Self, person, place, time) x      4    	 Disoriented to:                    	 Special Accommodations: []  Hearing impaired   []  Blind  []  Nonverbal  []  Cognitive impairmentOxygenation Upon Admission: [x]  RA	[]  NC	[]  Venti  []  Simple Mask []  Other	Baseline O2 Status? [x]  Yes	[]  NoAmbulation: []  Independent	[]  Cane   []  Walker	[]  Wheelchair	[]  Bedbound		[]  Hemiplegic	[]  Paraplegic	[]  QuadraplegicEliminiation: []  Independent	[]  Commode	[]  Bedpan/Urinal  []  Straight Cath []  Foley cath			[]  Urostomy	[]  Colostomy	Other (specify):Diarrhea/Loose stool : []  1x within 24h  []  2x within 24h  []  3x within 24h  [x]  None 	C.Diff Order: 	[]  Ordered- needs to be collected             []  Collected-sent to lab             []  Resulted - Negative C.Diff             []  Resulted - Positive C.Diff[]  Not Ordered   [x]  N/ASkin Alteration: []  Pressure Injury []  Wound []  None [x]  Skin not assessedDiet: [x]  Regular/No order placed	[]  NPO		Other (specify):IV Access: []  PIV   []  PICC    []  Port    [] Central line    []  A-line    Other (specify)IVF/GTT Running Upon ED Departure? []  No	    [x]  Yes (specify):d51/2nsOutstanding Meds/Treatments/Tests:Meds Not Given: (as of time of note) 9:19 PM  Current Facility-Administered Medications Medication 		                                                  D5 1/2 NS 150 mL/hr (11/17/23 1828)  HYDROmorphone     sodium chloride    		Patient Belongings:Are the belongings documented?          [x]  No	    []  YesIs someone taking belongings home?   [x]  No     []  Yes  Who? (specify)                                   Armand Berliner, RN

## 2023-11-18 LAB — BASIC METABOLIC PANEL
BKR ANION GAP: 14 (ref 7–17)
BKR BLOOD UREA NITROGEN: 12 mg/dL (ref 6–20)
BKR BUN / CREAT RATIO: 13.5 (ref 8.0–23.0)
BKR CALCIUM: 9 mg/dL (ref 8.8–10.2)
BKR CHLORIDE: 98 mmol/L (ref 98–107)
BKR CO2: 21 mmol/L (ref 20–30)
BKR CREATININE DELTA: -0.02
BKR CREATININE: 0.89 mg/dL (ref 0.40–1.30)
BKR EGFR, CREATININE (CKD-EPI 2021): >60 mL/min/{1.73_m2} (ref >=60)
BKR GLUCOSE: 121 mg/dL — ABNORMAL HIGH (ref 70–100)
BKR POTASSIUM: 4 mmol/L (ref 3.3–5.3)
BKR SODIUM: 133 mmol/L — ABNORMAL LOW (ref 136–144)

## 2023-11-18 LAB — MANUAL DIFFERENTIAL
BKR WAM BASOPHIL - ABS (DIFF) 2 DEC: 0 x 1000/ÂµL (ref 0.00–1.00)
BKR WAM BASOPHILS (DIFF): 0 % (ref 0.0–1.4)
BKR WAM EOSINOPHILS (DIFF) 2 DEC: 0 x 1000/ÂµL (ref 0.00–1.00)
BKR WAM EOSINOPHILS (DIFF): 0 % (ref 0.0–5.0)
BKR WAM LYMPHOCYTE - ABS (DIFF) 2 DEC: 5.42 x 1000/ÂµL — ABNORMAL HIGH (ref 0.60–3.70)
BKR WAM LYMPHOCYTES (DIFF): 24.4 % (ref 17.0–50.0)
BKR WAM METAMYELOCYTES (DIFF) 1 DEC: 1.7 % — ABNORMAL HIGH (ref 0.0–1.0)
BKR WAM MONOCYTE - ABS (DIFF) 2 DEC: 1.73 x 1000/ÂµL — ABNORMAL HIGH (ref 0.00–1.00)
BKR WAM MONOCYTES (DIFF): 7.8 % (ref 4.0–12.0)
BKR WAM MYELOCYTES (DIFF) 1 DEC: 1.7 % — ABNORMAL HIGH (ref 0.0–0.0)
BKR WAM NEUTROPHILS (DIFF): 64.4 % (ref 39.0–72.0)
BKR WAM NEUTROPHILS - ABS (DIFF) 2 DEC: 14.3 x 1000/ÂµL — ABNORMAL HIGH (ref 2.00–7.60)

## 2023-11-18 LAB — CBC WITH AUTO DIFFERENTIAL
BKR WAM ABSOLUTE NRBC (2 DEC): 2.41 x 1000/ÂµL — ABNORMAL HIGH (ref 0.00–1.00)
BKR WAM HEMATOCRIT (2 DEC): 19.6 % — ABNORMAL LOW (ref 38.50–50.00)
BKR WAM HEMOGLOBIN: 6.7 g/dL — ABNORMAL LOW (ref 13.2–17.1)
BKR WAM MCH (PG): 32.8 pg (ref 27.0–33.0)
BKR WAM MCHC: 34.2 g/dL (ref 31.0–36.0)
BKR WAM MCV: 96.1 fL (ref 80.0–100.0)
BKR WAM MPV: 9.3 fL (ref 8.0–12.0)
BKR WAM NUCLEATED RED BLOOD CELLS: 10.9 % — ABNORMAL HIGH (ref 0.0–1.0)
BKR WAM PLATELETS: 347 x1000/ÂµL (ref 150–420)
BKR WAM RDW-CV: 18.6 % — ABNORMAL HIGH (ref 11.0–15.0)
BKR WAM RED BLOOD CELL COUNT.: 2.04 M/ÂµL — ABNORMAL LOW (ref 4.00–6.00)
BKR WAM WHITE BLOOD CELL COUNT: 22.2 x1000/ÂµL — ABNORMAL HIGH (ref 4.0–11.0)

## 2023-11-18 MED ORDER — ACETAMINOPHEN 325 MG TABLET
325 | Freq: Three times a day (TID) | ORAL | Status: DC
Start: 2023-11-18 — End: 2023-11-22
  Administered 2023-11-18 – 2023-11-21 (×11): 325 mg via ORAL

## 2023-11-18 MED ORDER — ONDANSETRON 4 MG DISINTEGRATING TABLET
4 | Freq: Once | ORAL | Status: DC | PRN
Start: 2023-11-18 — End: 2023-11-22

## 2023-11-18 MED ORDER — KETAMINE IVPB FOR SICKLE CELL DISEASE
Freq: Every day | INTRAVENOUS | Status: DC
Start: 2023-11-18 — End: 2023-11-21
  Administered 2023-11-18 – 2023-11-21 (×4): 100.000 mL/h via INTRAVENOUS

## 2023-11-18 MED ORDER — LORAZEPAM 0.5 MG TABLET
0.5 | ORAL | Status: DC | PRN
Start: 2023-11-18 — End: 2023-11-22

## 2023-11-18 MED ORDER — SENNOSIDES 8.6 MG TABLET
8.6 | Freq: Every evening | ORAL | Status: DC
Start: 2023-11-18 — End: 2023-11-22
  Administered 2023-11-20: 21:00:00 8.6 mg via ORAL

## 2023-11-18 MED ORDER — LABETALOL 100 MG TABLET
100 | Freq: Once | ORAL | Status: DC | PRN
Start: 2023-11-18 — End: 2023-11-22

## 2023-11-18 MED ORDER — HYDROMORPHONE BOLUS VIA PCA PUMP
Freq: Once | Status: CP
Start: 2023-11-18 — End: ?

## 2023-11-18 MED ORDER — ACETAMINOPHEN 325 MG TABLET
325 | Freq: Four times a day (QID) | ORAL | Status: DC | PRN
Start: 2023-11-18 — End: 2023-11-18

## 2023-11-18 MED ORDER — OXYCODONE ER 15 MG TABLET,CRUSH RESISTANT,EXTENDED RELEASE 12 HR
15 | Freq: Two times a day (BID) | ORAL | Status: DC
Start: 2023-11-18 — End: 2023-11-22
  Administered 2023-11-18 – 2023-11-21 (×7): 15 mg via ORAL

## 2023-11-18 MED ORDER — KETAMINE IVPB FOR SICKLE CELL DISEASE
Freq: Every day | INTRAVENOUS | Status: DC
Start: 2023-11-18 — End: 2023-11-18

## 2023-11-18 MED ORDER — HYDROMORPHONE (DILAUDID) PCA 1 MG/ML (50 ML) YNH PYXIS
1 | INTRAVENOUS | Status: DC
Start: 2023-11-18 — End: 2023-11-20
  Administered 2023-11-18 – 2023-11-20 (×3): 1 mL via INTRAVENOUS

## 2023-11-18 MED ORDER — KETOROLAC 15 MG/ML INJECTION SOLUTION
15 | Freq: Four times a day (QID) | INTRAVENOUS | Status: DC
Start: 2023-11-18 — End: 2023-11-22
  Administered 2023-11-18 – 2023-11-21 (×11): 15 mL via INTRAVENOUS

## 2023-11-18 MED ORDER — KETOROLAC 60 MG/2 ML INTRAMUSCULAR SOLUTION
60 | Freq: Four times a day (QID) | INTRAVENOUS | Status: DC | PRN
Start: 2023-11-18 — End: 2023-11-18
  Administered 2023-11-18: 08:00:00 60 mL via INTRAVENOUS

## 2023-11-18 MED ORDER — HYDROXYUREA 500 MG CAPSULE
500 | Freq: Every day | ORAL | Status: DC
Start: 2023-11-18 — End: 2023-11-22
  Administered 2023-11-18 – 2023-11-21 (×4): 500 mg via ORAL

## 2023-11-18 MED ORDER — POLYETHYLENE GLYCOL 3350 17 GRAM ORAL POWDER PACKET
17 | Freq: Every day | ORAL | Status: DC
Start: 2023-11-18 — End: 2023-11-22

## 2023-11-18 MED ORDER — SODIUM CHLORIDE 0.9 % INTRAVENOUS SOLUTION
INTRAVENOUS | Status: DC
Start: 2023-11-18 — End: 2023-11-21

## 2023-11-18 NOTE — ED Provider Notes
 Chief Complaint Patient presents with  Sickle Cell Pain   Pt c/o severe sickle cell pain since this AM, used pain plan at home without relief. C/o low back and chest pain 10/10 -------------------------------------------------------------------------------------------------------------------------------Emergency Medicine Resident Note History of Present Illness:Phillip Bell is a 28 y.o. 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 Pt reports onset of pain in his back yesterday. He describes it as diffuse pain throughout his entire back which has been constant since onset. Pt has taken his home pain meds, oxycodone  15mg  immediate release and oxycontin  15mg  12 hr extended release, without any improvement. He denies any recent trauma to the back CP, SOB, fever/chills, n/v/d, abd pain, urinary symptoms at this time. Assessment & NWG:NFAOZHYQM exam findings: Gen: eyes closed, appears tense and in painHEENT: normocephalic, atraumatic, PERRL, sclera nonictericNeck: no TTP over midline c-spine, full ROM intactHeart: RRR, no murmursLungs: CTAB, no ronchi or wheezing, no accessory muscle useBack: diffusely TTP throughoutDdx includes but is not limited to: acute chest vs sickle cell pain crisis vs msk strainRemaining review of systems and physical exam as detailed above.ED Course & Clinical Impression:Upon initial eval patient is tense, clearly in pain.  Vital signs notable for hypertension.  Patient has a sickle cell pain plan that was followed.  Chest x-ray with no acute cardiothoracic abnormality.  Labs notable for hematocrit of 20.  Following patient's sickle cell plan we will not transfuse at this time.  After receiving initial set of analgesics patient did not report any improvement in pain.  We will plan to start on PCA and admit to medicine for sickle cell pain crisis. The patient was presented to and cared for under the guidance of Dr. Glynda Lash, the attending physician. Maranda Senegal, DOEmergency Medicine Resident5/17/2025 6:11 PMReachable on Mobile Heartbeat Please excuse any typos in dictation--------------------------------------------------------------------------------------------------_MDM  Physical ExamED Triage Vitals [11/17/23 1736]BP: (!) 145/86Pulse: 77Pulse from  O2 sat: n/aResp: 16Temp: 98.3 ?F (36.8 ?C)Temp src: OralSpO2: 98 % BP 123/71  - Pulse (!) 98  - Temp 98.5 ?F (36.9 ?C) (Oral)  - Resp 18  - Ht 6' 2 (1.88 m)  - Wt 70.3 kg  - SpO2 94%  - BMI 19.90 kg/m? Physical Exam ProceduresAttestation/Critical CarePatient Reevaluation: Attending Supervised: ResidentI saw and examined the patient. I agree with the findings and plan of care as documented in the resident's note except as noted below. Additional acute and/or chronic problems addressed:  The patient presents with severe sickle cell pain.  He states pain has distributed in his back and chest.  He tried his home plan without relief.  He presents to the emergency department for additional analgesia.XR Chest PA or AP Final Result     No acute cardiothoracic abnormality.    [facility] Notify System Classification: Routine.    Reported and signed by: Lonnell Rob, MD      [facility]   The patient is admitted for further control of his pain.Karie Ou. Glynda Lash, MD, MS Clinical Impressions as of 11/18/23 2348 Sickle cell pain crisis [facility] Code)   ED DispositionAdmit  Camillia Celeste, MD05/17/25 0155 Maranda Senegal, DOResident05/17/25 0336 Alonie Gazzola J, MD05/17/25 2348

## 2023-11-18 NOTE — Plan of Care
 Plan of Care Update:Phillip Bell arrived to the floor around 1300. His pain was poorly controlled and he was tearful and requesting additional therapy. Home oxycontin  15 mg BID restarted and risks/benefits of ketamine  infusion discussed. Pt opted for ketamine ; infusion started this afternoon. Pain remains poorly controlled so dilaudid  PCA increased to 0.7 mg/10 min lockout/hourly max 4.2 mg around 1600. Remainder per H&P.Ellender Gust, MDSmilow Hospitalist Service

## 2023-11-18 NOTE — Plan of Care
 The px was seen and independently examined by [facility] was reviewed including Dr Tyra Galley Hand P, appreciated27 yr old man Pmhx sickle cell anemia being managed for sickle cell crisis,On dilaudid  PCA pumpO/e somnolent but arousableRRs1s2Flat BS +Nil edemaSickle cell pain crisisContinue outlined managementOlubunmi Christyl Osentoski MD, FACP

## 2023-11-18 NOTE — ED Notes
 9:30 PM - Assumed nursing care of Phillip Bell, 28 y.o. male arrived B-6 for continuity of care r/t sickle cell pain. A/Ox4. VS stable. IV Dilaudid  initiated by PCA pump, pain uncontrolled, PRN Toradol  given /w satisfactory relief per patient. Left IV catheter patent. Continent x2, LBM 5/16, voids spont, urinal provided at bedside. Independent in/out of bed. Call light place within reach. Frequent RN rounding completed.0330 - Endorsed severe back pain, bolus via PCA pump given.1610 - Lab specimen collected and sent to lab. BP 127/77  - Pulse 76  - Temp 97.5 ?F (36.4 ?C) (Oral)  - Resp 18  - SpO2 100% Electronically Signed by Arelia Kub, RN, Nov 18, 2023

## 2023-11-18 NOTE — ED Notes
 07:15- Assumed care. Pt is A&Ox4. VSS. Pt is contx2, urinal within reach. Pt independent in bed. 08:00- Pt states 8/10 back pain, prn Toradol  given with effect. Dilaudid  PCA pump continued. Pt resting in between care. Call light in reach10:40- Pt resting in bed1220: pt off unit to floor via stretcher with transport

## 2023-11-18 NOTE — H&P
  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 Patient developed severe acute-onset lower back pain despite taking his oxycodone  and oxycontin  as instructed, radiating to the right lower extremity, not associated with numbness or weakness or limited ROM on the lumbar spine or RLE. Patient endorsed mild difficulty breathing due to pain worsened by deep breath. Patient denies any fever/chills/chest pain/nausea/vomiting/diarrhea. ER course:Vitals:  11/17/23 1736 11/17/23 1936 11/18/23 0008 BP: (!) 145/86 123/72 127/77 Pulse: 77 68 76 Resp: 16 16 18  Temp: 98.3 ?F (36.8 ?C) 97.6 ?F (36.4 ?C) 97.5 ?F (36.4 ?C) TempSrc: Oral  Oral SpO2: 98% 100% 100% Labs: Na 133, tBili 1.8, WBC 13, Hgb 7.1, Plt 529, Retic [place] Pct 12.2, target cells+, Smudge cells +CXR: no acute findingsMeds: dilaudid  PCA pump startedPatient is admitted to medical floor for further managementMedical History: PMH PSH Past Medical History: Diagnosis Date  Aplastic crisis (HC Code)  12/08/2003  Avascular necrosis of femur head, left (HC Code)    Chronic pain   sickle cell  Dactylitis one episode  April, 1998  GERD (gastroesophageal reflux disease)   no issues  H/O sickle cell crisis 07/2023  per pt r/t covid infection  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 Lorrene Rosser   Family History family history includes Alcohol abuse in his maternal aunt; Diabetes in his paternal grandfather; Hypertension in his paternal grandfather; Sarcoidosis in his father; Sickle cell trait in his father and mother; Thyroid disease in his mother.Social History  reports that he has never smoked. He has never used smokeless tobacco. He reports current alcohol use. He reports that he does not use drugs.Prior to Admission Medications (Not in a hospital admission) Allergies Allergies Allergen Reactions  Nickel Rash  Review of Systems: Review of Systems Constitutional:  Positive for fatigue. Negative for activity change, appetite change, chills, diaphoresis, fever and unexpected weight change. HENT:  Negative for congestion, dental problem, drooling, ear discharge, ear pain, facial swelling, hearing loss, mouth sores, nosebleeds, postnasal drip, rhinorrhea, sinus pressure, sinus pain, sneezing, sore throat, tinnitus, trouble swallowing and voice change.  Eyes:  Negative for photophobia, pain, discharge, redness, itching and visual disturbance. Respiratory:  Negative for apnea, cough, choking, chest tightness, shortness of breath, wheezing and stridor.  Cardiovascular:  Negative for chest pain, palpitations and leg swelling. Gastrointestinal:  Negative for abdominal distention, abdominal pain, anal bleeding, blood in stool, constipation, diarrhea, nausea, rectal pain and vomiting. Endocrine: Negative for cold intolerance, heat intolerance, polydipsia, polyphagia and polyuria. Genitourinary:  Negative for decreased urine volume, difficulty urinating, dysuria, enuresis, flank pain, frequency, genital sores, hematuria, penile discharge,  penile pain, penile swelling, scrotal swelling, testicular pain and urgency. Musculoskeletal:  Positive for arthralgias and back pain. Negative for gait problem, joint swelling, myalgias, neck pain and neck stiffness. Skin:  Negative for color change, pallor, rash and wound. Allergic/Immunologic: Negative for immunocompromised state. Neurological:  Negative for dizziness, tremors, seizures, syncope, facial asymmetry, speech difficulty, weakness, light-headedness, numbness and headaches. Hematological:  Negative for adenopathy. Does not bruise/bleed easily. Psychiatric/Behavioral:  Negative for agitation, behavioral problems, confusion, decreased concentration, dysphoric mood, hallucinations, self-injury, sleep disturbance and suicidal ideas. The patient is not nervous/anxious and is not hyperactive.   Objective: Vitals:I have reviewed the patient's current vital signs as documented in the patient's EMR.  Last 24 hours: Temp:  [97.5 ?F (36.4 ?C)-98.3 ?F (36.8 ?C)] 97.5 ?F (36.4 ?C)Pulse:  [68-77] 76Resp:  [16-18] 18BP: (123-145)/(72-86) 127/77SpO2:  [98 %-100 %] 100 %Physical Exam: Physical Exam  General: NADHEENT: PERRL. EOMI. MMM.Heart: RRR.   Normal S1 and S2 sounds.Lungs: Lungs clear to auscultation. No increased work of breathing.Abdomen: Normoactive bowel sounds. No tenderness to palpation. Non-distended abdomen.Extremities: No lower extremity edema appreciated.Pulses: 2+ radial and 2+ DP pulsesSkin: Warm and dryNeurologic: Grossly AOx3. Able to converse fluently. No focal neuro deficits appreciated. Labs: I have reviewed the patient's labs within the last 24 hrs.Last 24 hours: Recent Results (from the past 24 hours) Reticulocytes  Collection Time: 11/17/23  6:17 PM Result Value Ref Range  Immature Reticulocyte Fraction 55.9 (H) 3.0 - 15.9 %  Reticulocyte Hgb Equivalent 35.7 28.2 - 35.7 pg  Percent Reticulocyte 12.2 (H) 0.6 - 2.7 %  Absolute Reticulocyte 0.267 (H) 0.023 - 0.140 10?6 cells/uL Type and screen     [facility] LM[facility] Y[facility])  Collection Time: 11/17/23  6:17 PM Result Value Ref Range  ABO Grouping O   Rh Type POS   Antibody Screen NEG   Specimen Expiration Date And Time 11/20/2023 23:59  Hepatic function panel  Collection Time: 11/17/23  6:17 PM Result Value Ref Range  Total Bilirubin 1.8 (H) <=1.2 mg/dL  Bilirubin, Direct    Alkaline Phosphatase 103 9 - 122 U/L  Alanine Aminotransferase (ALT) 49 9 - 59 U/L  Aspartate Aminotransferase (AST)    AST/ALT Ratio    Total Protein 7.9 5.9 - 8.3 g/dL  Albumin 4.5 3.6 - 5.1 g/dL  Globulin 3.4 2.0 - 3.9 g/dL  A/G Ratio 1.3 1.0 - 2.2 CBC auto differential  Collection Time: 11/17/23  6:17 PM Result Value Ref Range  WBC 13.0 (H) 4.0 - 11.0 x1000/?L  RBC 2.18 (L) 4.00 - 6.00 M/?L  Hemoglobin 7.2 (L) 13.2 - 17.1 g/dL  Hematocrit 16.10 (L) 96.04 - 50.00 %  MCV 95.9 80.0 - 100.0 fL  MCH 33.0 27.0 - 33.0 pg  MCHC 34.4 31.0 - 36.0 g/dL  RDW-CV 54.0 (H) 98.1 - 15.0 %  Platelets 529 (H) 150 - 420 x1000/?L  MPV 9.1 8.0 - 12.0 fL  nRBC 8.6 (H) 0.0 - 1.0 %  Absolute nRBC 1.11 (H) 0.00 - 1.00 x 1000/?L Basic metabolic panel  Collection Time: 11/17/23  6:17 PM Result Value Ref Range  Sodium 133 (L) 136 - 144 mmol/L  Potassium    Chloride 101 98 - 107 mmol/L  CO2 21 20 - 30 mmol/L  Anion Gap 11 7 - 17  Glucose 119 (H) 70 - 100 mg/dL  BUN 11 6 - 20 mg/dL  Creatinine 1.91 4.78 - 1.30 mg/dL  Calcium 9.1 8.8 - 29.5 mg/dL  BUN/Creatinine Ratio 62.1 8.0 - 23.0  eGFR (  Creatinine) >60 >=60 mL/min/1.43m2  Creatinine Delta 0.12 See Comment Manual Differential  Collection Time: 11/17/23  6:17 PM Result Value Ref Range  Neutrophils 65.0 39.0 - 72.0 %  Lymphocytes 13.7 (L) 17.0 - 50.0 %  Monocytes 12.8 (H) 4.0 - 12.0 %  Eosinophils 4.3 0.0 - 5.0 %  Basophil 0.8 0.0 - 1.4 %  Myelocytes 3.4 (H) 0.0 - 0.0 %  Neutrophils Absolute 8.43 (H) 2.00 - 7.60 x 1000/?L  Lymphocyte Absolute 1.78 0.60 - 3.70 x 1000/?L  Monocyte Absolute Count 1.66 (H) 0.00 - 1.00 x 1000/?L  Eosinophil Absolute Count 0.56 0.00 - 1.00 x 1000/?L  Basophil Absolute Count 0.10 0.00 - 1.00 x 1000/?L  RBC Morphology Reviewed   Polychromasia 2+ (A) None  Sickle Cells 1+ (A) None  Smudge Cells Present (A) None  Target Cells 2+ (A) None  Tear Drop Cells 1+ (A) None  Giant PLTs Present None Potassium  Collection Time: 11/17/23  7:30 PM Result Value Ref Range  Potassium 4.0 3.3 - 5.3 mmol/L Diagnostics:I have reviewed the patient's Radiology report(s) within the last 48 hrs. Significant findings are  .XR Chest PA or APResult Date: 5/16/2025XR CHEST PA OR AP INDICATION: chest pain COMPARISON: XR CHEST PA AND LATERAL 2023-07-20 FINDINGS:   The cardiomediastinal silhouette is normal. The lungs are clear. The pleural spaces are clear. There is no acute osseous injury.  No acute cardiothoracic abnormality. [facility] Notify System Classification: Routine. Reported and signed by: Lonnell Rob, MD  [facility] Radiology and Biomedical Imaging  ECG/Tele Events: I have reviewed the patient's ECG as resulted in the EMR.Assessment: Patient is a 28 yo male with PMHx sig for sickle cell disease c/b acute chest syndrome and avascular necrosis of left femoral head s/p repair(2021), aplastic crisis (2005), who presented with sickle cell pain crisis. Appropriate increase of Retic [place]. Medically stable, no sign for acute chest syndrome now. Principal Problem:  Sickle cell pain crisis (HC Code)   SNOMED [place](R): HEMOGLOBIN SS DISEASE WITH CRISIS  Plan:  # Sickle cell pain crisis- continue dilaudid  PCA with 0.5mg  with Lockout Interval and total max dosing 3mg  hourly- Ketorolac  15mg  IV q6h PRN for mild to moderate breakthrough pain- utilize dilaudid  bolus from PCA for severe breakthrough pain - start tylenol  975mg  PO q8h  - start scheduled bowel regimen with senna and miralax - hold home Oxycodone  and Oxycotin for now- c/w hydroxyurea  2000mg  PO daily- trend CBC daily, goal of Hgb >7- consult hematology in am I have discussed the patients code status:Yeswith:patientComorbidities Comorbidities present on admission: PMH of COVID  Secondary diagnoses occurring during hospitalization:Hyponatremia Notifications: PCP: Solmon Dupont [phone]    I have personally notified the patient's primary care provider of this admission. Yes  Family was notified of this admission. YesI have personally discussed the plan with the patient and/or family. YesI have reviewed the plan of care with the bedside nurse, including an emphasis on the most important aspects of care for the next 12 hours: yesAdvance Care Planning Advance Care Planning Patient has a living will: noPatient has healthcare representative/proxy: no Electronically Signed:Durwin Davisson Hassel Lins, MD 11/18/2023, 12:11 AMAddendum: None

## 2023-11-19 NOTE — Progress Notes
 [facility]	 [facility]	Daily Progress NoteHospital Medicine ServiceAttending Provider: Ellender Gust, MD [phone]:  [id number]-BHospital Day #2 Subjective: Patient seen and examined on 5/18/2025Interim History: Verbalizes some improvement with pain in comparison to yesterdayFeels the PCA dose increased most helpful with improved painUnable to differentiate if ketamine  has helped however amenable to continue with D2 infusionPain remains most bothersome to his lower back consistent with his usual crises presentationEncouraged to use adjunctive modalities such as heat packs, lidocaine  etc.however declining lidocaine  as he feels he hasn't tolerated them in the pastContinues on standing tylenol  and toradol   as well as long acting home Oxycontin  15 mg Q12hHgb down-trended to 6.7 from 7.2, will monitor for nowIncreased leukocytosis without any CP, SOB and no other subjective complaint aside from pain therefore will continue to monitor for nowObjective: Current Meds:acetaminophen , 975 mg, Q8H enoxaparin  prophylaxis dosing, 40 mg, Dailyhydroxyurea, 2,000 mg, Dailyketamine (KETALAR ) 34.1 mg in sodium chloride  0.9% 100 mL IVPB for Sickle Cell Disease, 0.5 mg/kg, Dailyketorolac, 15 mg, Q6HoxyCODONE, 15 mg, Q12Hpolyethylene glycol, 17 g, Dailysenna, 1 tablet, Nightlysodium chloride, 3 mL, Q8H Vitals:Temp:  [97.8 ?F (36.6 ?C)-98.7 ?F (37.1 ?C)] 98.4 ?F (36.9 ?C)Pulse:  [87-111] 111Resp:  [16-20] 17BP: (115-129)/(68-81) 126/81SpO2:  [94 %-97 %] 96 %Device (Oxygen Therapy): nasal cannulaLabs:Complete Blood CountResults in Past 7 DaysResult Component Current Result Hematocrit 19.60 (L) (11/18/2023) Hemoglobin 6.7 (L) (11/18/2023) MCH 32.8 (11/18/2023) MCHC 34.2 (11/18/2023) MCV 96.1 (11/18/2023) MPV 9.3 (11/18/2023) Platelets 347 (11/18/2023) RBC 2.04 (L) (11/18/2023) WBC 22.2 (H) (11/18/2023)    Chemistry  Lab Results Component Value Date  NA 133 (L) 11/18/2023  K 4.0 11/18/2023  CL 98 11/18/2023  CO2 21 11/18/2023  BUN 12 11/18/2023  CREATININE 0.89 11/18/2023  GLU 121 (H) 11/18/2023  Lab Results Component Value Date  CALCIUM 9.0 11/18/2023  ALKPHOS 103 11/17/2023  AST  11/17/2023    Comment:    Sample Hemolyzed.   ALT 49 11/17/2023  BILITOT 1.8 (H) 11/17/2023   I/O's:No intake or output data in the 24 hours ending 11/19/23 1142Physical ExamConstitutional:     General: He is not in acute distress.   Appearance: He is not ill-appearing. HENT:    Nose: No congestion or rhinorrhea. Eyes:    Extraocular Movements: Extraocular movements intact. Cardiovascular:    Rate and Rhythm: Normal rate and regular rhythm.    Pulses: Normal pulses.    Heart sounds: Normal heart sounds. Pulmonary:    Effort: Pulmonary effort is normal.    Breath sounds: Normal breath sounds. Abdominal:    General: Bowel sounds are normal.    Palpations: Abdomen is soft. Musculoskeletal:       General: Normal range of motion. Skin:   General: Skin is warm. Neurological:    General: No focal deficit present.    Mental Status: He is alert and oriented to person, place, and time. Mental status is at baseline. Psychiatric:       Mood and Affect: Mood normal.       Behavior: Behavior normal.       Thought Content: Thought content normal.       Judgment: Judgment normal. Access:  PIVFoley: NoImaging:No results found. I reviewed lab results, imaging and test results in formulating this patient's plan of care. Assessment: Phillip Bell is a 28 y.o. 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), aplastic crises (2005) who is now admitted after presenting to the ED with pain  consistent with his typical sickle cell crises uncontrolled on his home oral regimen with unclear trigger.Plan: HbSSAnemiaChronic painHx AVN- Hgb 6.7, transfusion deferred for now, continue to trend labs daily- Continue dilaudid  PCA increased  dose 0.7/10/4.2 --> increased given uncontrolled pain on FYI settings--> wean tomorrow as able- Continue D2 of Ketamine - Continue Ketorolac  15mg  IV q6h ; adjunctive modalities--> diclofenac  topical ointment- Continue Oxycontin  15 mg BID- HOLD home Oxycodone  oral tier- Continue tylenol  975mg  PO q8h  - Continue bowel regimen with senna and miralax - Continue hydroxyurea  2000mg  PO daily   Diet RegularVTE PPx: LovenoxMedication Reconciliation: Completed: by Provider Communication: RN, Consultants, Outpatient Providers, and Case ManagementDischarge Readiness: not medically ready Expected discharge location: Home   Full CodeMedical decision making for this patient was high.I spent 50 minutes today on this encounter before, during and after the visit, reviewing labs and records, evaluating and examining the patient, entering orders and documenting the visit.Signed:Stace Peace Luster Salters, APRNYNHH Hospitalist Service5/18/202511:42 AMAvailable via MHB

## 2023-11-19 NOTE — Plan of Care
 Plan of Care Overview/ Patient StatusMitchel Bell is a 28 y.o. male patient.  SNOMED [place](R) 1. Sickle cell pain crisis (HC Code)   HEMOGLOBIN SS DISEASE WITH CRISIS 2. Hemoglobin SS disease with crisis (HC Code)   HEMOGLOBIN SS DISEASE WITH CRISIS Pt AAOx4, tachycardic on room air. Pt complains of back pain 7/10; PCA pump running w/ NS at 10mL/hr continuously via L peripheral IV. Ketamine  given IVPB once this shift. On regular diet; adequate PO intake. Independent OOB to bathroom. Continent to B&B; LBM 5/16. Urinal within reach. Pt overall calm & cooperative with POC.Blood pressure 126/81, pulse (!) 111, temperature 98.4 ?F (36.9 ?C), temperature source Oral, resp. rate 17, height 6' 2 (1.88 m), weight 70.3 kg, SpO2 96%.Veleta Gerold, RN5/18/2025

## 2023-11-19 NOTE — Plan of Care
 Plan of Care Overview/ Patient [id number] is A&Ox4, VSS on Ra. PRN O2 setup at bedside. C/o moderate-severe generalized pain, utilizing Dilaudid  pca and scheduled pain medications with fair effect. No complaints of n/v. Ind OOB. PIV patent, pca and NS running at 10 cc/hr. Encouraged to T&R. Medicated as ordered. Safety maintained.

## 2023-11-19 NOTE — Progress Notes
 Attending Addendum:I have seen and evaluated this patient with APRN Luster Salters and agree with the assessment and plan as documented in their note with the following additions/modifications:Briefly, Star is a 28 yo with HbSS c/b AVN who is admitted with acute VOE. He was started on ketamine  yesterday and PCA uptitrated with improvement in pain. He has moderate-severe pain today but improved from yesterday. No other concerns. 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

## 2023-11-20 LAB — MANUAL DIFFERENTIAL
BKR WAM BASOPHIL - ABS (DIFF) 2 DEC: 0 x 1000/ÂµL (ref 0.00–1.00)
BKR WAM BASOPHILS (DIFF): 0 % (ref 0.0–1.4)
BKR WAM EOSINOPHILS (DIFF) 2 DEC: 0 x 1000/ÂµL (ref 0.00–1.00)
BKR WAM EOSINOPHILS (DIFF): 0 % (ref 0.0–5.0)
BKR WAM LYMPHOCYTE - ABS (DIFF) 2 DEC: 0.66 x 1000/ÂµL (ref 0.60–3.70)
BKR WAM LYMPHOCYTES (DIFF): 3.5 % — ABNORMAL LOW (ref 17.0–50.0)
BKR WAM MONOCYTE - ABS (DIFF) 2 DEC: 1.16 x 1000/ÂµL — ABNORMAL HIGH (ref 0.00–1.00)
BKR WAM MONOCYTES (DIFF): 6.2 % (ref 4.0–12.0)
BKR WAM NEUTROPHILS (DIFF): 90.3 % — ABNORMAL HIGH (ref 39.0–72.0)
BKR WAM NEUTROPHILS - ABS (DIFF) 2 DEC: 16.97 x 1000/ÂµL — ABNORMAL HIGH (ref 2.00–7.60)

## 2023-11-20 LAB — CBC WITH AUTO DIFFERENTIAL
BKR WAM ABSOLUTE NRBC (2 DEC): 1.67 x 1000/ÂµL — ABNORMAL HIGH (ref 0.00–1.00)
BKR WAM HEMATOCRIT (2 DEC): 15.8 % — ABNORMAL LOW (ref 38.50–50.00)
BKR WAM HEMOGLOBIN: 5.7 g/dL — CL (ref 13.2–17.1)
BKR WAM MCH (PG): 33.7 pg — ABNORMAL HIGH (ref 27.0–33.0)
BKR WAM MCHC: 36.1 g/dL — ABNORMAL HIGH (ref 31.0–36.0)
BKR WAM MCV: 93.5 fL (ref 80.0–100.0)
BKR WAM MPV: 9.4 fL (ref 8.0–12.0)
BKR WAM NUCLEATED RED BLOOD CELLS: 8.9 % — ABNORMAL HIGH (ref 0.0–1.0)
BKR WAM PLATELETS: 380 x1000/ÂµL (ref 150–420)
BKR WAM RDW-CV: 19.9 % — ABNORMAL HIGH (ref 11.0–15.0)
BKR WAM RED BLOOD CELL COUNT.: 1.69 M/ÂµL — ABNORMAL LOW (ref 4.00–6.00)
BKR WAM WHITE BLOOD CELL COUNT: 18.8 x1000/ÂµL — ABNORMAL HIGH (ref 4.0–11.0)

## 2023-11-20 LAB — BASIC METABOLIC PANEL
BKR ANION GAP: 11 (ref 7–17)
BKR BLOOD UREA NITROGEN: 9 mg/dL (ref 6–20)
BKR BUN / CREAT RATIO: 12.9 (ref 8.0–23.0)
BKR CALCIUM: 8.2 mg/dL — ABNORMAL LOW (ref 8.8–10.2)
BKR CHLORIDE: 99 mmol/L (ref 98–107)
BKR CO2: 22 mmol/L (ref 20–30)
BKR CREATININE DELTA: -0.19
BKR CREATININE: 0.7 mg/dL (ref 0.40–1.30)
BKR EGFR, CREATININE (CKD-EPI 2021): >60 mL/min/{1.73_m2} (ref >=60)
BKR GLUCOSE: 127 mg/dL — ABNORMAL HIGH (ref 70–100)
BKR POTASSIUM: 4.1 mmol/L (ref 3.3–5.3)
BKR SODIUM: 132 mmol/L — ABNORMAL LOW (ref 136–144)

## 2023-11-20 MED ORDER — LACTULOSE 10 GRAM/15 ML ORAL SOLUTION
10 | Freq: Three times a day (TID) | ORAL | Status: DC
Start: 2023-11-20 — End: 2023-11-22
  Administered 2023-11-20: 21:00:00 10 mL via ORAL

## 2023-11-20 MED ORDER — HYDROMORPHONE (DILAUDID) PCA 1 MG/ML (50 ML) YNH PYXIS
1 | INTRAVENOUS | Status: DC
Start: 2023-11-20 — End: 2023-11-21

## 2023-11-20 NOTE — Plan of Care
 Plan of Care Overview/ Patient StatusProblem: Adult Inpatient Plan of CareGoal: Readiness for Transition of CareOutcome: Interventions implemented as appropriatePer chart... is a 28 y.o. 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 Met patient at bedside to discuss role and the discharge planning process, he verbalized understanding. Patient lives alone at home in [place]., Mother and father supportive and lives close by. At baseline,  Pt is independent with all daily care needs without the use of DME. Pt is not active with any home care agency - no needs anticipated. Family to provide transportation home upon discharge. Case Management will take lead to arrange post acute care services and continue to work with the care team as the patient progresses towards discharge.Case Management/Social Work Geophysicist/field seismologist Most Recent Value Case Management/Social Work Screening: Chart review completed. If YES to any question below then proceed to Eval/Plan  Is there a change in their cognitive function No Do you anticipate that the patient will have any discharge needs requiring CM/SW intervention? No Has there been an unscheduled readmission within the last 30 days and/or four (4) encounters (encounters include: ED, OBS, Inpatient) within the last six (6) months? No Were there services prior to admission ( Examples: Acute [facility],  Assisted Living, HD, Homecare, Extended Care Facility, Methadone, SNF, Outpatient Infusion Center) No Concern for current abuse/neglect/interpersonal violence/sexual assault  No Connected to state agency? No Guardianship or conservatorship No Negative/Positive Screen Negative Screening: Case Management department will follow patient's progress and discuss plan of care with treatment team. Case Manager/Social Work Attestation  I have reviewed the medical record and completed the above screen. CM/SW staff will follow patient's progress and discuss the plan of care with the Treatment Team. Yes Housing / Transportation/ Environment  What is your living situation today? I have a steady place to live Think about the place you live. Do you have problems with any of the following? None In the past 12 months has the electric, gas, oil, or water  company threatened to shut off services in your home? No Within the past 12 months, you worried that your food would run out before you got the money to buy more. Never true Within the past 12 months, the food you bought just didn't last and you didn't have money to get more. Never true In the past 12 months, has lack of transportation kept you from medical appointments or from getting medications? no In the past 12 months, has lack of transportation kept you from meetings, work, or from getting things needed for daily living? No  Abuse Screen  Is there anyone in your life that is hurting or threatening you in anyway? no Read to patient We know that many of our patients and caregivers are exposed to violence in the home so we have started letting everyone know that [place]  has a safe, confidential and free 24/7 hotline called [employer] Safe Connect yes education provided Would it be ok if we add this resource to your DC paperwork? yes Abuse Screen (yes response referral indicated)  Able to respond to abuse questions Yes Is there anyone in your life that is hurting or threatening you in anyway? no Physical Indicators of Abuse No evidence  of physical abuse Read to patient We know that many of our patients and caregivers are exposed to violence in the home so we have started letting everyone know that [place]  has a safe, confidential and free 24/7 hotline called [employer] Safe Connect yes education provided Would it be ok if we add this resource to your DC paperwork? yes   Cory Dingwall RN BSN CHPNSmilow Care ManagerCare Management Dept -[facility]

## 2023-11-20 NOTE — Progress Notes
 [facility]	 [facility]	Daily Progress NoteHospital Medicine ServiceAttending Provider: Dominga Friedman, MD [phone]:  4725/4725-BHospital Day #3 Subjective: Patient seen and examined on 5/19/2025Interim History: Appears more comfortable, endorsing improved painReluctant to wean PCA this am, however amenable to weaning this afternoon to 20 minute lock out + Day 3 KetamineHgb 5.7--> Transfused 1 unit pRBCContinues on adjunctive modalities but declining lidocaine  patchNo BM since admission, reluctant to take bowel regimen however reiterated the importance especially while receiving PCADown-trended leukocytosis from yesterday, remains afebrile otherwiseObjective: Current Meds:acetaminophen , 975 mg, Q8H enoxaparin  prophylaxis dosing, 40 mg, Dailyhydroxyurea, 2,000 mg, Dailyketamine (KETALAR ) 34.1 mg in sodium chloride  0.9% 100 mL IVPB for Sickle Cell Disease, 0.5 mg/kg, Dailyketorolac, 15 mg, Q6Hlactulose, 20 g, TIDoxyCODONE, 15 mg, Q12Hpolyethylene glycol, 17 g, Dailysenna, 1 tablet, Nightlysodium chloride, 3 mL, Q8H Vitals:Temp:  [98 ?F (36.7 ?C)-99.8 ?F (37.7 ?C)] 99.1 ?F (37.3 ?C)Pulse:  [81-125] 108Resp:  [16-20] 19BP: (103-120)/(61-73) 106/67SpO2:  [93 %-95 %] 94 %Device (Oxygen Therapy): room airLabs:Complete Blood CountResults in Past 7 DaysResult Component Current Result Hematocrit 15.80 (L) (11/20/2023) Hemoglobin 5.7 (LL) (11/20/2023) MCH 33.7 (H) (11/20/2023) MCHC 36.1 (H) (11/20/2023) MCV 93.5 (11/20/2023) MPV 9.4 (11/20/2023) Platelets 380 (11/20/2023) RBC 1.69 (L) (11/20/2023) WBC 18.8 (H) (11/20/2023)    Chemistry  Lab Results Component Value Date  NA 132 (L) 11/20/2023  K 4.1 11/20/2023  CL 99 11/20/2023  CO2 22 11/20/2023  BUN 9 11/20/2023  CREATININE 0.70 11/20/2023  GLU 127 (H) 11/20/2023  Lab Results Component Value Date CALCIUM 8.2 (L) 11/20/2023  ALKPHOS 103 11/17/2023  AST  11/17/2023    Comment:    Sample Hemolyzed.   ALT 49 11/17/2023  BILITOT 1.8 (H) 11/17/2023   I/O's:Intake/Output Summary (Last 24 hours) at 11/20/2023 1613Last data filed at 11/20/2023 1425Gross per 24 hour Intake 354.25 ml Output -- Net 354.25 ml Physical ExamConstitutional:     General: He is not in acute distress.   Appearance: He is not ill-appearing. HENT:    Nose: No congestion or rhinorrhea. Eyes:    Extraocular Movements: Extraocular movements intact. Cardiovascular:    Rate and Rhythm: Normal rate and regular rhythm.    Pulses: Normal pulses.    Heart sounds: Normal heart sounds. Pulmonary:    Effort: Pulmonary effort is normal.    Breath sounds: Normal breath sounds. Abdominal:    General: Bowel sounds are normal.    Palpations: Abdomen is soft. Musculoskeletal:       General: Normal range of motion. Skin:   General: Skin is warm. Neurological:    General: No focal deficit present.    Mental Status: He is alert and oriented to person, place, and time. Mental status is at baseline. Psychiatric:       Mood and Affect: Mood normal.       Behavior: Behavior normal.       Thought Content: Thought content normal.       Judgment: Judgment normal. Access:  PIVFoley: NoImaging:No results found. I reviewed lab results, imaging and test results in formulating this patient's plan of care. Assessment: Phillip Bell is a 28 y.o. 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), aplastic crises (2005) who is now admitted after presenting to the ED with pain consistent with his typical sickle cell crises uncontrolled on his home oral regimen with unclear trigger.Plan: HbSSAnemiaChronic painHx AVN- Hgb 5.7, transfused 1 unit of pRBC today- Trend labs daily- Weaned dilaudid   PCA  lock out to 20 minutes--> 0.7/20/2.1- Continue D3 of Ketamine - Continue Ketorolac  15mg  IV q6h ; adjunctive modalities--> diclofenac  topical ointment- Continue Oxycontin  15 mg BID- HOLD home Oxycodone  oral tier- Continue tylenol  975mg  PO q8h  - Continue to encourage bowel regimen with senna and miralax ; added lactulose  today- Continue hydroxyurea  2000mg  PO daily   Diet RegularVTE PPx: LovenoxMedication Reconciliation: Completed: by Provider Communication: RN, Consultants, Outpatient Providers, and Case ManagementDischarge Readiness: not medically ready Expected discharge location: HomeExpected Discharge Date: 11/22/23  Full CodeMedical decision making for this patient was high.I spent 50 minutes today on this encounter before, during and after the visit, reviewing labs and records, evaluating and examining the patient, entering orders and documenting the visit.Signed:Imad Shostak Luster Salters, APRNYNHH Hospitalist Service5/19/202511:42 AMAvailable via MHB

## 2023-11-20 NOTE — Plan of Care
 Plan of Care Overview/ Patient Status0700-1900A&O x4 VSS on RA, can be tachycardic, and will put on NC 1L for comfort. Afebrile. Hemoglobin 5.7 this AM, Unit RBC provided. PIV inserted to right forearm for infusion, patent. Left PIV patent and infusion PCA an NS for pain. Ketamine  infused as well this shift. Skin intact. Continent x2, urine concentrated, no BM this shift. OOB independent in room. Able to make needs known. Bed locked in lowest position. Call bell within reach. Problem: Adult Inpatient Plan of CareGoal: Plan of Care ReviewOutcome: Interventions implemented as appropriateGoal: Patient-Specific Goal (Individualized)Outcome: Interventions implemented as appropriateGoal: Absence of Hospital-Acquired Illness or InjuryOutcome: Interventions implemented as appropriateGoal: Optimal Comfort and WellbeingOutcome: Interventions implemented as appropriate Problem: Fall Injury RiskGoal: Absence of Fall and Fall-Related InjuryOutcome: Interventions implemented as appropriate

## 2023-11-20 NOTE — Plan of Care
 Plan of Care Overview/ Patient [id number] is A&Ox4, VSS on Ra. PRN O2 setup at bedside. C/o moderate-severe generalized pain, utilizing Dilaudid  pca and scheduled pain medications with fair effect. No complaints of n/v. Ind OOB. PIV patent, pca and NS running at 10 cc/hr. Encouraged to T&R. Medicated as ordered. Safety maintained.

## 2023-11-20 NOTE — Telephone Encounter
 Pt admitted with SC VOC pain. I confirmed 5/28 at 1130 for f/u with [facility].

## 2023-11-20 NOTE — Other
 PHARMACY-ASSISTED MEDICATION REPORTPharmacist review of the best possible medication history obtained by the pharmacy medication history technician has been performed.  I have updated the home medication list and identified the following information that may be relevant to this admission.NOTES/RECOMMENDATIONSNo recommendations at this time.       Prior to Admission Medications Medication Name Sig Taking? Patient Reported   amoxicillin -clavulanate (AUGMENTIN ) 875-125 mg per tabletLast dose:  --  Take 1 tablet by mouth every 12 (twelve) hours for 7 days. Yes     hydroxyurea  (HYDREA ) 500 mg capsuleLast dose:  --  Take 4 capsules (2,000 mg total) by mouth daily. Yes     ibuprofen  (ADVIL ,MOTRIN ) 600 mg tabletLast dose:  --  TAKE ONE TABLET EVERY 6 HOURS (UP TO 4 TABS A DAY) DAILY AS NEEDED FOR PAIN       naloxone  (NARCAN ) 4 mg/actuation nasal sprayLast dose:  -- Last Medication Note: >> Oretha Birch   Fri Nov 17, 2023 10:23 PMMedHxTech(BN):Patient has medication for PRN use Entered by Oretha Birch, CPHT Fri Nov 17, 2023 2223 Use 1 spray in 1 nostril for suspected opioid overdose. May repeat in 2 minutes in other nostril with new device if minimal or no response.       oxyCODONE  (ROXICODONE ) 15 mg Immediate Release tabletLast dose: 11/17/2023 Take 1 tablet (15 mg total) by mouth every 4 (four) hours as needed for pain. Yes     OXYCONTIN  15 mg 12 hr extended release tabletLast dose: 11/17/2023 Take 1 tablet (15 mg total) by mouth every 12 (twelve) hours. Yes     Prior to admission medications last reviewed by Ronita Cohens, PharmD on Mon Nov 20, 2023 1412 Thank Ronelle Coffee, PharmD, BCPS5/19/20252:12 PMPhone: MHB

## 2023-11-21 ENCOUNTER — Encounter
Admit: 2023-11-21 | Payer: PRIVATE HEALTH INSURANCE | Attending: Primary Care | Primary: Student in an Organized Health Care Education/Training Program

## 2023-11-21 DIAGNOSIS — D72829 Elevated white blood cell count, unspecified: Secondary | ICD-10-CM

## 2023-11-21 DIAGNOSIS — E871 Hypo-osmolality and hyponatremia: Secondary | ICD-10-CM

## 2023-11-21 DIAGNOSIS — G8929 Other chronic pain: Secondary | ICD-10-CM

## 2023-11-21 DIAGNOSIS — D589 Hereditary hemolytic anemia, unspecified: Secondary | ICD-10-CM

## 2023-11-21 DIAGNOSIS — D5700 Hb-SS disease with crisis, unspecified: Secondary | ICD-10-CM

## 2023-11-21 DIAGNOSIS — I10 Essential (primary) hypertension: Secondary | ICD-10-CM

## 2023-11-21 DIAGNOSIS — Z8616 Personal history of COVID-19: Secondary | ICD-10-CM

## 2023-11-21 LAB — CBC WITH AUTO DIFFERENTIAL
BKR WAM ABSOLUTE IMMATURE GRANULOCYTES.: 0.17 x 1000/ÂµL (ref 0.00–0.30)
BKR WAM ABSOLUTE LYMPHOCYTE COUNT.: 3.02 x 1000/ÂµL (ref 0.60–3.70)
BKR WAM ABSOLUTE NRBC: 0.64 x 1000/ÂµL (ref 0.00–1.00)
BKR WAM ANC (ABSOLUTE NEUTROPHIL COUNT): 12.32 x 1000/ÂµL — ABNORMAL HIGH (ref 2.00–7.60)
BKR WAM BASOPHIL ABSOLUTE COUNT.: 0.05 x 1000/ÂµL (ref 0.00–1.00)
BKR WAM BASOPHILS: 0.3 % (ref 0.0–1.4)
BKR WAM EOSINOPHIL ABSOLUTE COUNT.: 0.11 x 1000/ÂµL (ref 0.00–1.00)
BKR WAM EOSINOPHILS: 0.7 % (ref 0.0–5.0)
BKR WAM HEMATOCRIT: 16.8 % — ABNORMAL LOW (ref 38.50–50.00)
BKR WAM HEMOGLOBIN: 6 g/dL — CL (ref 13.2–17.1)
BKR WAM IMMATURE GRANULOCYTES: 1 % (ref 0.0–1.0)
BKR WAM LYMPHOCYTES: 18.2 % (ref 17.0–50.0)
BKR WAM MCH: 32.6 pg (ref 27.0–33.0)
BKR WAM MCHC: 35.7 g/dL (ref 31.0–36.0)
BKR WAM MCV: 91.3 fL (ref 80.0–100.0)
BKR WAM MONOCYTE ABSOLUTE COUNT.: 0.91 x 1000/ÂµL (ref 0.00–1.00)
BKR WAM MONOCYTES: 5.5 % (ref 4.0–12.0)
BKR WAM MPV: 9.7 fL (ref 8.0–12.0)
BKR WAM NEUTROPHILS: 74.3 % — ABNORMAL HIGH (ref 39.0–72.0)
BKR WAM NUCLEATED RED BLOOD CELLS: 3.9 % — ABNORMAL HIGH (ref 0.0–1.0)
BKR WAM PLATELETS: 439 x1000/ÂµL — ABNORMAL HIGH (ref 150–420)
BKR WAM RDW-CV: 20.6 % — ABNORMAL HIGH (ref 11.0–15.0)
BKR WAM RED BLOOD CELL COUNT.: 1.84 M/ÂµL — ABNORMAL LOW (ref 4.00–6.00)
BKR WAM WHITE BLOOD CELL COUNT: 16.6 x1000/ÂµL — ABNORMAL HIGH (ref 4.0–11.0)

## 2023-11-21 LAB — BASIC METABOLIC PANEL
BKR ANION GAP: 12 (ref 7–17)
BKR BLOOD UREA NITROGEN: 12 mg/dL (ref 6–20)
BKR BUN / CREAT RATIO: 15.8 (ref 8.0–23.0)
BKR CALCIUM: 8.5 mg/dL — ABNORMAL LOW (ref 8.8–10.2)
BKR CHLORIDE: 101 mmol/L (ref 98–107)
BKR CO2: 22 mmol/L (ref 20–30)
BKR CREATININE DELTA: 0.06
BKR CREATININE: 0.76 mg/dL (ref 0.40–1.30)
BKR EGFR, CREATININE (CKD-EPI 2021): >60 mL/min/{1.73_m2} (ref >=60)
BKR GLUCOSE: 130 mg/dL — ABNORMAL HIGH (ref 70–100)
BKR POTASSIUM: 4.2 mmol/L (ref 3.3–5.3)
BKR SODIUM: 135 mmol/L — ABNORMAL LOW (ref 136–144)

## 2023-11-21 LAB — RETICULOCYTES
BKR WAM IRF: 37 % — ABNORMAL HIGH (ref 3.0–15.9)
BKR WAM RETICULOCYTE - ABS (3 DEC): 0.159 10Ë6 cells/uL — ABNORMAL HIGH (ref 0.023–0.140)
BKR WAM RETICULOCYTE COUNT PCT (1 DEC): 8.6 % — ABNORMAL HIGH (ref 0.6–2.7)
BKR WAM RETICULOCYTE HGB EQUIVALENT: 33.3 pg (ref 28.2–35.7)

## 2023-11-21 MED ORDER — OXYCODONE IMMEDIATE RELEASE 15 MG TABLET
15 | ORAL_TABLET | ORAL | 1 refills | 2.00000 days | Status: AC | PRN
Start: 2023-11-21 — End: ?

## 2023-11-21 MED ORDER — OXYCODONE IMMEDIATE RELEASE 15 MG TABLET
15 | ORAL | Status: DC | PRN
Start: 2023-11-21 — End: 2023-11-22
  Administered 2023-11-21: 14:00:00 15 mg via ORAL

## 2023-11-21 MED ORDER — AMOXICILLIN 875 MG-POTASSIUM CLAVULANATE 125 MG TABLET
875-125 | Freq: Two times a day (BID) | ORAL | Status: DC
Start: 2023-11-21 — End: 2023-11-22

## 2023-11-21 MED ORDER — HYDROMORPHONE (DILAUDID) PCA 1 MG/ML (50 ML) YNH PYXIS
1 | INTRAVENOUS | Status: DC
Start: 2023-11-21 — End: 2023-11-21

## 2023-11-21 NOTE — Discharge Instructions
 It has been a pleasure caring for you during your recent hospital admission.You were admitted for sickle cell crises management with blood transfusion, intravenous fluids, Dilaudid  PCA and IV ketamine . During this hospital course you were transfused 2 units of blood given anemia. As discussed, please be sure to have labs checked on Thursday 5/22 at any free standing labs to monitor your transfusion response before the weekend.If you have any questions following discharge, please don't hesitate to reach out directly to the outpatient Hematology clinic at [phone].Take care, we wish you all the best!Oxycodone  #90 sent to [facility] on [address]

## 2023-11-21 NOTE — Plan of Care
 Zacarias Depaolis was discharged via Ambulatory accompanied by Parent.  Verbalized understanding of discharge instructionsand recommended follow up care as per the after visit summary.  Written discharge instructions provided. Denies any further questions. Vital signs    Vitals:  11/21/23 1719 11/21/23 1747 11/21/23 1945 11/21/23 2002 BP: 107/67 105/66 119/77 119/77 Pulse: (!) 101 78 (!) 100 (!) 102 Resp: 18 16 18 18  Temp: 98 ?F (36.7 ?C) 98.2 ?F (36.8 ?C) 98 ?F (36.7 ?C) 98 ?F (36.7 ?C) TempSrc: Oral Oral Oral Oral SpO2: 100% 97% 99% 99% Weight:     Height:     Patient confirmed all belongings returned. Belongings charted in last 7 days:  Plan of Care Overview/ Patient StatusPt A&O , on room air, VSS, s/p unit of blood, no adverse reaction , patient being discharge,no complaints of pain voiced, in no distress.

## 2023-11-21 NOTE — Plan of Care
 Plan of Care Overview/ Patient Status0700-1900A&O x4 VSS on RA, can be tachycardic. Hemoglobin 6.0 this AM, Unit RBC provided in evening. Left PIV patent and infusion PCA an NS for pain. Right PIV, patent, SL. Ketamine  infused as well this shift. Skin intact. Continent x2, urine concentrated, pt reported small BM overnight. OOB independent in room. Able to make needs known. Bed locked in lowest position. Call bell within reach. Problem: Adult Inpatient Plan of CareGoal: Plan of Care ReviewOutcome: Interventions implemented as appropriateGoal: Patient-Specific Goal (Individualized)Outcome: Interventions implemented as appropriateGoal: Absence of Hospital-Acquired Illness or InjuryOutcome: Interventions implemented as appropriateGoal: Optimal Comfort and WellbeingOutcome: Interventions implemented as appropriate Problem: Fall Injury RiskGoal: Absence of Fall and Fall-Related InjuryOutcome: Interventions implemented as appropriate

## 2023-11-21 NOTE — Plan of Care
 Plan of Care Overview/ Patient StatusPt A&O x4On N/C @1L /minVSSSkin intactLPIV patent and intactR PIV infusing NS@10  ml/hrPCA pump Dilaudid  1mg /dl IV  continuesToradol IV push q 6 hours givenOxycodone q 12 hrsLittle relief voicedRefused laxativesRefused blood work to be done in amFluids well taken when [employer] within reach.

## 2023-11-21 NOTE — Discharge Summary
 [facility]	Med/Surg Discharge SummaryPatient Data:  Patient Name: Phillip Bell Age: 28 y.o. 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 He endorsed back pain onset one day prior to presentation described as diffuse throughout his entire back which has been been constant without any relief since onset. Home meds refractory to pain include oxycodone  15 mg IR and Oxycontin  15 mg extended release without any improvement. ED courseBP 123/71  - Pulse (!) 98  - Temp 98.5 ?F (36.9 ?C) (Oral)  - Resp 18  - Ht 6' 2 (1.88 m)  - Wt 70.3 kg  - SpO2 94%  - CXR obtained while in the ED without any findings for any acute cardiothoracic abnormality. Given unsuccessful pain control after FYI he was started on a PCA and admitted for further management.While admitted, pain continued requiring PCA dose increase from 0.5 to 0.7 with the addition of Ketamine  with improved effect. Transfused 1 unit of blood on 5/19 given hgb 5.7 with minimal response given hgb of 6.0, therefore transfused an additional unit. Reticulocyte's 0.159, therefore held hydroxyurea  given continued anemia. Pt with improved pain opting for discharge to you home with plan for outpatient lab check on Thursday 5/22. He has close outpatient Hematology follow up next week on 5/28. He declined re-start to Augmentin  for pharyngitis which he was prescribed a 7 day course days prior to admission for which he reports he took for 5 days and stopped as he felt it precipitated his crises. Explained the importance of completing prescribed antibiotics to clear infection however declined despite risk and benefits discussed.Discharged to home in stable condition on 5/20 with repeat lab check on Thursday 5/22 and close outpatient hematology follow up next week on 5/28. Pertinent Procedures NonePertinent lab findings and test results: Complete Blood CountResults in Past 7 DaysResult Component Current Result Hematocrit 16.80 (L) (11/21/2023) Hemoglobin 6.0 (LL) (11/21/2023) MCH 32.6 (11/21/2023) MCHC 35.7 (11/21/2023) MCV 91.3 (11/21/2023) MPV 9.7 (11/21/2023) Platelets 439 (H) (11/21/2023) RBC 1.84 (L) (11/21/2023) WBC 16.6 (H) (11/21/2023)    Chemistry  Lab Results Component Value Date  NA 135 (L) 11/21/2023  K 4.2 11/21/2023  CL 101 11/21/2023  CO2 22 11/21/2023  BUN 12 11/21/2023  CREATININE 0.76 11/21/2023  GLU 130 (H) 11/21/2023  Lab Results Component Value Date  CALCIUM 8.5 (L) 11/21/2023  ALKPHOS 103 11/17/2023  AST  11/17/2023    Comment:    Sample Hemolyzed.   ALT 49 11/17/2023  BILITOT 1.8 (H) 11/17/2023   5/19: Hgb 5.7WBC 18.8Discharge vitals: Blood pressure 116/61, pulse 81, temperature 99.8 ?F (37.7 ?C), temperature source Oral, resp. rate 18, height 6' 2 (1.88 m), weight 70.3 kg, SpO2 (!) 93%.Discharge Physical Exam:Physical ExamHENT:    Mouth/Throat:    Mouth: Mucous membranes are moist.    Pharynx: Oropharynx is clear. Eyes:    Extraocular Movements: Extraocular movements intact. Cardiovascular:    Rate and Rhythm: Normal rate and regular rhythm.    Pulses: Normal pulses.    Heart sounds:  Normal heart sounds. Pulmonary:    Effort: Pulmonary effort is normal.    Breath sounds: Normal breath sounds. Abdominal:    General: Bowel sounds are normal.    Palpations: Abdomen is soft. Musculoskeletal:       General: Normal range of motion.    Cervical back: Normal range of motion. Skin:   General: Skin is warm. Neurological:    General: No focal deficit present.    Mental Status: He is alert and oriented to person, place, and time. Psychiatric:       Mood and Affect: Mood normal.       Behavior: Behavior normal.       Thought Content: Thought content normal.       Judgment: Judgment normal. Pertinent Findings of Physical Exam NoneCognitive Status at Discharge: alert & orientedPending Labs and Tests: Pending Lab Results   Order Current Status  Basic metabolic panel Collected (11/20/23 0552)  CBC auto differential Collected (11/20/23 0552)  CBC auto differential In process  ISSUES TO BE ADDRESSED POST DISCHARGE: 1 Outpatient lab check on Thursday given anemia2 Outpatient follow up with Hematology 5/28Discharge Medications: Discharge: Current Discharge Medication List  CONTINUE these medications which have CHANGED  Details oxyCODONE  (ROXICODONE ) 15 mg Immediate Release tablet Take 1 tablet (15 mg total) by mouth every 4 (four) hours as needed for pain.Qty: 90 tablet, Refills: 0Start date: 11/21/2023  Associated Diagnoses: Sickle cell disease without crisis (HC Code)    CONTINUE these medications which have NOT CHANGED  Details OXYCONTIN  15 mg 12 hr extended release tablet Take 1 tablet (15 mg total) by mouth every 12 (twelve) hours.Qty: 60 tablet, Refills: 0  ibuprofen  (ADVIL ,MOTRIN ) 600 mg tablet TAKE ONE TABLET EVERY 6 HOURS (UP TO 4 TABS A DAY) DAILY AS NEEDED FOR PAINQty: 100 tablet, Refills: 1  naloxone  (NARCAN ) 4 mg/actuation nasal spray Use 1 spray in 1 nostril for suspected opioid overdose. May repeat in 2 minutes in other nostril with new device if minimal or no response.Qty: 2 each, Refills: 0   STOP taking these medications   amoxicillin -clavulanate (AUGMENTIN ) 875-125 mg per tablet    hydroxyurea  (HYDREA ) 500 mg capsule    Follow-up Information:No follow-up provider specified. Future Appointments     Provider Department Center  11/29/2023 11:30 AM Hershell Lose, APRN [facility] [place] [facility]  02/14/2024 1:00 PM Hershell Lose, APRN [facility] [place] [facility]  11/13/2024 1:00 PM Solmon Dupont, MD [facility] Internal [facility] [address] [place]. [place]   [facility] Past Medical History: Diagnosis Date  Aplastic crisis (HC Code)  12/08/2003  Avascular necrosis of femur head, left (HC Code)    Chronic pain   sickle cell  Dactylitis one episode  April, 1998  GERD (gastroesophageal reflux disease)   no issues  H/O sickle cell crisis 07/2023  per pt r/t covid infection  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 Lorrene Rosser  Social  History Family History Social History Tobacco Use  Smoking status: Never  Smokeless tobacco: Never Substance Use Topics  Alcohol use: Yes   Comment: occ  Family History Problem Relation Age of Onset  Sickle cell trait Mother   Thyroid disease Mother Sickle cell trait Father   Sarcoidosis Father   Diabetes Paternal Grandfather   Hypertension Paternal Grandfather   Alcohol abuse Maternal Aunt   Electronically Signed:Carlotta Luster Salters, APRN 11/20/2023 7:45 AMBest Contact Information: Available via MHBAddendum : [place] Hospitalist Attending Addendum: Patient was seen and examined independently. I have reviewed and agree with APP Luster Salters' discharge summary.  Kiren is a 28yo man with HbSS here with VOE, possibly precipitated by bacterial pharyngitis. We reviewed that he should complete the full course of abx for this, but he declined. He received a simple blood transfusion yesterday. Unfortunately Hb only improved from 5.7 to 6.0 (baseline 7-8). We are planning for an additional transfusion today with close lab check at the end of the week. He is adamant about discharge home today. We have spaced out the PCA and started short acting oral opioids. He will follow up in the sickle cell clinic next week. Sammuel Crimes MDHospitalist, [employer]-Management Service5/20/2025

## 2023-11-23 ENCOUNTER — Encounter
Admit: 2023-11-23 | Payer: PRIVATE HEALTH INSURANCE | Primary: Student in an Organized Health Care Education/Training Program

## 2023-11-23 ENCOUNTER — Ambulatory Visit
Admit: 2023-11-23 | Payer: BLUE CROSS/BLUE SHIELD | Primary: Student in an Organized Health Care Education/Training Program

## 2023-11-23 NOTE — Progress Notes
 [facility] Post Discharge Outreach: Transition of Care NoteRisk of Unplanned Readmission: 8.47%PERTINENT INFORMATION:-  Blood work, inst to reach out to hematology to confirm he can wait until Wed for labs. AVS states to have done today.     [facility] Post Discharge Outreach spoke with: PatientDischarging Hospital: [facility] Discharge Date: 11/22/2023    Discharge location: HomeHOSPITALIZATION:Sickle cell pain crisisCURRENT STATE:Since discharge patient reports feeling: Same Patient cared for by: FamilyMother  REVIEW OF AFTER VISIT SUMMARY DOCUMENT:Patient specific questions regarding discharge: No MEDICATION CHANGES:Validated NEW medications to take: N/AValidated Changed medications to take: N/AValidated Stopped medications to NOT take: YesIssues obtaining prescriptions: NoAdditional medication related notes: No FOLLOW-UP APPOINTMENTS and TRANSPORTATION:   DME and HOME HEALTH SERVICES:  Plan established for follow up labs/tests: NoPt states he will go for bw on Wed at upcoming appt, advisd to reach out to hematology to conf that waiting is ok, AVS states to have done today ADDITIONAL PATIENT NEEDS:Identified Social Determinants of Health opportunities: N/a  Additional patient needs addressed: No

## 2023-11-29 ENCOUNTER — Inpatient Hospital Stay
Admit: 2023-11-29 | Discharge: 2023-11-29 | Payer: BLUE CROSS/BLUE SHIELD | Primary: Student in an Organized Health Care Education/Training Program

## 2023-11-29 ENCOUNTER — Encounter
Admit: 2023-11-29 | Payer: PRIVATE HEALTH INSURANCE | Attending: Primary Care | Primary: Student in an Organized Health Care Education/Training Program

## 2023-11-29 ENCOUNTER — Encounter
Admit: 2023-11-29 | Payer: PRIVATE HEALTH INSURANCE | Primary: Student in an Organized Health Care Education/Training Program

## 2023-11-29 ENCOUNTER — Ambulatory Visit
Admit: 2023-11-29 | Payer: PRIVATE HEALTH INSURANCE | Attending: Family | Primary: Student in an Organized Health Care Education/Training Program

## 2023-11-29 ENCOUNTER — Ambulatory Visit
Admit: 2023-11-29 | Payer: BLUE CROSS/BLUE SHIELD | Attending: Primary Care | Primary: Student in an Organized Health Care Education/Training Program

## 2023-11-29 ENCOUNTER — Encounter
Admit: 2023-11-29 | Payer: PRIVATE HEALTH INSURANCE | Attending: Family | Primary: Student in an Organized Health Care Education/Training Program

## 2023-11-29 VITALS — BP 117/71 | HR 77 | Temp 98.30000°F | Resp 18 | Ht 74.0 in | Wt 151.9 lb

## 2023-11-29 DIAGNOSIS — Z8616 History of COVID-19: Secondary | ICD-10-CM

## 2023-11-29 DIAGNOSIS — D7389 Other diseases of spleen: Secondary | ICD-10-CM

## 2023-11-29 DIAGNOSIS — D571 Sickle-cell disease without crisis: Secondary | ICD-10-CM

## 2023-11-29 DIAGNOSIS — Z862 Personal history of diseases of the blood and blood-forming organs and certain disorders involving the immune mechanism: Secondary | ICD-10-CM

## 2023-11-29 DIAGNOSIS — D6189 Other specified aplastic anemias and other bone marrow failure syndromes: Secondary | ICD-10-CM

## 2023-11-29 DIAGNOSIS — D5700 Hb-SS disease with crisis, unspecified: Secondary | ICD-10-CM

## 2023-11-29 DIAGNOSIS — Z23 Encounter for immunization: Secondary | ICD-10-CM

## 2023-11-29 DIAGNOSIS — M87052 Idiopathic aseptic necrosis of left femur: Secondary | ICD-10-CM

## 2023-11-29 DIAGNOSIS — IMO0002 Dactylitis: Secondary | ICD-10-CM

## 2023-11-29 DIAGNOSIS — Z7964 On hydroxyurea therapy: Secondary | ICD-10-CM

## 2023-11-29 DIAGNOSIS — K219 Gastro-esophageal reflux disease without esophagitis: Secondary | ICD-10-CM

## 2023-11-29 DIAGNOSIS — G8929 Other chronic pain: Secondary | ICD-10-CM

## 2023-11-29 LAB — CBC WITH AUTO DIFFERENTIAL
BKR WAM ABSOLUTE IMMATURE GRANULOCYTES.: 0.02 x 1000/ÂµL (ref 0.00–0.30)
BKR WAM ABSOLUTE LYMPHOCYTE COUNT.: 3.2 x 1000/ÂµL (ref 0.60–3.70)
BKR WAM ABSOLUTE NRBC: 0.15 x 1000/ÂµL (ref 0.00–1.00)
BKR WAM ANC (ABSOLUTE NEUTROPHIL COUNT): 3.69 x 1000/ÂµL (ref 2.00–7.60)
BKR WAM BASOPHIL ABSOLUTE COUNT.: 0.06 x 1000/ÂµL (ref 0.00–1.00)
BKR WAM BASOPHILS: 0.8 % (ref 0.0–1.4)
BKR WAM EOSINOPHIL ABSOLUTE COUNT.: 0.13 x 1000/ÂµL (ref 0.00–1.00)
BKR WAM EOSINOPHILS: 1.6 % (ref 0.0–5.0)
BKR WAM HEMATOCRIT: 21.9 % — ABNORMAL LOW (ref 38.50–50.00)
BKR WAM HEMOGLOBIN: 7.2 g/dL — ABNORMAL LOW (ref 13.2–17.1)
BKR WAM IMMATURE GRANULOCYTES: 0.3 % (ref 0.0–1.0)
BKR WAM LYMPHOCYTES: 40.6 % (ref 17.0–50.0)
BKR WAM MCH: 32 pg (ref 27.0–33.0)
BKR WAM MCHC: 32.9 g/dL (ref 31.0–36.0)
BKR WAM MCV: 97.3 fL (ref 80.0–100.0)
BKR WAM MONOCYTE ABSOLUTE COUNT.: 0.79 x 1000/ÂµL (ref 0.00–1.00)
BKR WAM MONOCYTES: 10 % (ref 4.0–12.0)
BKR WAM MPV: 8.8 fL (ref 8.0–12.0)
BKR WAM NEUTROPHILS: 46.7 % (ref 39.0–72.0)
BKR WAM NUCLEATED RED BLOOD CELLS: 1.9 % — ABNORMAL HIGH (ref 0.0–1.0)
BKR WAM PLATELETS: 1377 x1000/ÂµL — CR (ref 150–420)
BKR WAM RDW-CV: 17.9 % — ABNORMAL HIGH (ref 11.0–15.0)
BKR WAM RED BLOOD CELL COUNT.: 2.25 M/ÂµL — ABNORMAL LOW (ref 4.00–6.00)
BKR WAM WHITE BLOOD CELL COUNT: 7.9 x1000/ÂµL (ref 4.0–11.0)

## 2023-11-29 LAB — RETICULOCYTES
BKR WAM IRF: 26.7 % — ABNORMAL HIGH (ref 3.0–15.9)
BKR WAM RETICULOCYTE - ABS (3 DEC): 0.113 10Ë6 cells/uL (ref 0.023–0.140)
BKR WAM RETICULOCYTE COUNT PCT (1 DEC): 5 % — ABNORMAL HIGH (ref 0.6–2.7)
BKR WAM RETICULOCYTE HGB EQUIVALENT: 32.2 pg (ref 28.2–35.7)

## 2023-11-29 LAB — BASIC METABOLIC PANEL
BKR ANION GAP: 12 (ref 7–17)
BKR BLOOD UREA NITROGEN: 9 mg/dL (ref 6–20)
BKR BUN / CREAT RATIO: 11.3 (ref 8.0–23.0)
BKR CALCIUM: 9.2 mg/dL (ref 8.8–10.2)
BKR CHLORIDE: 103 mmol/L (ref 98–107)
BKR CO2: 23 mmol/L (ref 20–30)
BKR CREATININE DELTA: 0.04
BKR CREATININE: 0.8 mg/dL (ref 0.40–1.30)
BKR EGFR, CREATININE (CKD-EPI 2021): >60 mL/min/{1.73_m2} (ref >=60)
BKR GLUCOSE: 104 mg/dL — ABNORMAL HIGH (ref 70–100)
BKR POTASSIUM: 4.3 mmol/L (ref 3.3–5.3)
BKR SODIUM: 138 mmol/L (ref 136–144)

## 2023-11-29 MED ORDER — OXYCONTIN 15 MG TABLET,CRUSH RESISTANT,EXTENDED RELEASE
15 | ORAL_TABLET | Freq: Two times a day (BID) | ORAL | 1 refills | 3.00 days | Status: AC
Start: 2023-11-29 — End: ?

## 2023-11-29 NOTE — Progress Notes
 SOCIAL WORK ASSESSMENTPatient Name: Phillip RiceMedical Record Number: [id number] Date of Birth: [birth year]Medical Social Work Assessment Adult  Flowsheet Row Most Recent Value Admission Information  Document Type Clinical Assessment - Able to Assess Reason for Current Social Work Involvement Support/Coping, Resources Intervention Psycho-social Intervention Source of Information Patient Record Reviewed Yes Level of Care Ambulatory What medium(s) of communication were used with patient/family/caregiver? Telephone Relationships  Marital Status Single Adult Lives With Alone Family circumstances Mr. Sirico reported his mother, father and girlfriend are very supportive. Informal Supports (family, friends, church, etc) Mr. Cypert reported he has supportive friends. Formal Supports (current community resources and providers) Mr. Gaughan is seen in the hematology clinic to treat sickle cell disease and manage medication . Pertinent spiritual or cultural factors None reported at this time Relationship Comments Mr. Burlingame reported he has a girlfriend. Abuse Screen (yes response referral indicated)  Able to respond to abuse questions Yes Is there anyone in your life that is hurting or threatening you in anyway? no Safety Plan/Comments No Safety Plan Needed. Physical Indicators of Abuse No evidence of physical abuse Read to patient We know that many of our patients and caregivers are exposed to violence in the home so we have started letting everyone know that [facility]  has a safe, confidential and free 24/7 hotline called [place] [facility] yes education provided Would it be ok if we add this resource to your DC paperwork? no Language needed None, Patient Speaks English Literacy Read/write independently Special Needs Not applicable. 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 What is your living situation today? I have a steady place to live Think about the place you live. Do you have problems with any of the following? None In the past 12 months has the electric, gas, oil, or water  company threatened to shut off services in your home? No Within the past 12 months, you worried that your food would run out before you got the money to buy more. Never true Within the past 12 months, the food you bought just didn't last and you didn't have money to get more. Never true In the past 12 months, has lack of transportation kept you from medical appointments or from getting medications? No In the past 12 months, has lack of transportation kept you from meetings, work, or from getting things needed for daily living? No Housing/Transportation/Environmental Comment (use soup kitchens, food pantries, eviction pending, unable to afford) Mr. Heier reported he has a car.  Not currently in need of programs. Mental Status  Appearance well-groomed, appears stated age Attitude/Demeanor/Rapport appropriate to circumstances, pleasant Affect (typically observed) accepting, hopeful, appropriately reactive, pleasant Orientation no deficits recognized Insight Good Health Insight/Judgment no deficits recognized Reaction to Event/Health Status Accepting, Hopeful, Realistic Suicide Risk Assessment  Reason for Screening Utilizing SAFE-T and C-SSRS (Check all that apply) Social Work Consult/Assessment C-SSRS Able to Assess Screening for suicidal ideation within Past Month Have you wished you were dead or wished you could go to sleep and not wake up? no Have you actually had any thoughts of killing yourself? no Have you ever done anything, started to do anything, or prepared to do anything to end your life? no Preparation Comment Not applicable Past 3 months Comment Not applicable [place] Suicide Risk Level low risk Specific Questions about Thoughts, Plans, Suicidal Intent (SAFE-T) Negative responses above do not indicate a need for SAFE-T assessment Coping  Reaction to  Event/Health Status Accepting, Hopeful, Realistic Formulation: Recommendation(s) and Intervention(s) (including for discharge to occur)  Psychosocial issues requiring intervention Assessment Psychosocial interventions 10 minutes were spent on the phone with Mr. Collard.  Mr. Sroka shared that he lives alone in [place]. He is an only child and has the support of his mother, father, and girlfriend. He currently works remotely full-time for [employer].  Mr. Cush reported no psychosocial concerns or needs,  he feels safe at home, he denied experiencing any depressive symptoms, feeling of anxiety, thoughts of suicidal /homicidal ideation, no substance use/abuse.  Mr. Mapel states that everything is good, and he is enjoying life. I provided emotional support and active listening. Mr. Ferrebee has my contact information and is aware of social work availability. [facility] Next Steps/Plan (including hand-off): No further social work intervention is indicated at this time.  Please reconsult if additional needs arise. Signature: Fraser Jackson, LMSW Contact Information: [phone]

## 2023-12-06 ENCOUNTER — Encounter
Admit: 2023-12-06 | Payer: PRIVATE HEALTH INSURANCE | Attending: Family | Primary: Student in an Organized Health Care Education/Training Program

## 2023-12-06 ENCOUNTER — Telehealth
Admit: 2023-12-06 | Payer: PRIVATE HEALTH INSURANCE | Attending: Family | Primary: Student in an Organized Health Care Education/Training Program

## 2023-12-06 DIAGNOSIS — D571 Sickle-cell disease without crisis: Secondary | ICD-10-CM

## 2023-12-06 MED ORDER — OXYCODONE IMMEDIATE RELEASE 15 MG TABLET
15 | ORAL_TABLET | Freq: Four times a day (QID) | ORAL | 1 refills | 5.00000 days | Status: AC | PRN
Start: 2023-12-06 — End: 2024-03-25

## 2023-12-06 MED ORDER — OXYCODONE IMMEDIATE RELEASE 15 MG TABLET
15 | ORAL_TABLET | ORAL | 1 refills | 4.00000 days | Status: AC | PRN
Start: 2023-12-06 — End: ?

## 2023-12-06 NOTE — Telephone Encounter
 I made the pt aware that the 90 tabs should last 30days. He understood.

## 2023-12-06 NOTE — Telephone Encounter
 Patient would like to refill medication for - oxyCODONE  (ROXICODONE ) 15 mg Immediate Release tablet[id number] [place] [place] - [address] [place] [address]ZHYQM: [phone] Fax: [phone]: Not open 24 hours

## 2023-12-27 ENCOUNTER — Encounter
Admit: 2023-12-27 | Payer: PRIVATE HEALTH INSURANCE | Attending: Family | Primary: Student in an Organized Health Care Education/Training Program

## 2023-12-27 ENCOUNTER — Telehealth
Admit: 2023-12-27 | Payer: PRIVATE HEALTH INSURANCE | Primary: Student in an Organized Health Care Education/Training Program

## 2023-12-27 DIAGNOSIS — D571 Sickle-cell disease without crisis: Secondary | ICD-10-CM

## 2023-12-27 NOTE — Telephone Encounter
 I made the pt aware Philippe APRN would like to repeat her CBC on 6/26 or 6/27. He will go to [facility] blood draw on [address] or [address] in [facility]. He will call once he gets his labs drawn so I am aware. He understood the plan.

## 2023-12-29 ENCOUNTER — Inpatient Hospital Stay
Admit: 2023-12-29 | Discharge: 2023-12-29 | Payer: BLUE CROSS/BLUE SHIELD | Primary: Student in an Organized Health Care Education/Training Program

## 2023-12-29 DIAGNOSIS — D571 Sickle-cell disease without crisis: Principal | ICD-10-CM

## 2023-12-29 LAB — CBC WITH AUTO DIFFERENTIAL
BKR WAM ABSOLUTE IMMATURE GRANULOCYTES.: 0.02 x 1000/ÂµL (ref 0.00–0.30)
BKR WAM ABSOLUTE LYMPHOCYTE COUNT.: 2.93 x 1000/ÂµL (ref 0.60–3.70)
BKR WAM ABSOLUTE NRBC: 0.1 x 1000/ÂµL (ref 0.00–1.00)
BKR WAM ANC (ABSOLUTE NEUTROPHIL COUNT): 3.32 x 1000/ÂµL (ref 2.00–7.60)
BKR WAM BASOPHIL ABSOLUTE COUNT.: 0.06 x 1000/ÂµL (ref 0.00–1.00)
BKR WAM BASOPHILS: 0.8 % (ref 0.0–1.4)
BKR WAM EOSINOPHIL ABSOLUTE COUNT.: 0.26 x 1000/ÂµL (ref 0.00–1.00)
BKR WAM EOSINOPHILS: 3.6 % (ref 0.0–5.0)
BKR WAM HEMATOCRIT: 22.9 % — ABNORMAL LOW (ref 38.50–50.00)
BKR WAM HEMOGLOBIN: 7.7 g/dL — ABNORMAL LOW (ref 13.2–17.1)
BKR WAM IMMATURE GRANULOCYTES: 0.3 % (ref 0.0–1.0)
BKR WAM LYMPHOCYTES: 40.6 % (ref 17.0–50.0)
BKR WAM MCH: 32 pg (ref 27.0–33.0)
BKR WAM MCHC: 33.6 g/dL (ref 31.0–36.0)
BKR WAM MCV: 95 fL (ref 80.0–100.0)
BKR WAM MONOCYTE ABSOLUTE COUNT.: 0.63 x 1000/ÂµL (ref 0.00–1.00)
BKR WAM MONOCYTES: 8.7 % (ref 4.0–12.0)
BKR WAM MPV: 9.2 fL (ref 8.0–12.0)
BKR WAM NEUTROPHILS: 46 % (ref 39.0–72.0)
BKR WAM NUCLEATED RED BLOOD CELLS: 1.4 % — ABNORMAL HIGH (ref 0.0–1.0)
BKR WAM PLATELETS: 602 x1000/ÂµL — ABNORMAL HIGH (ref 150–420)
BKR WAM RDW-CV: 17.8 % — ABNORMAL HIGH (ref 11.0–15.0)
BKR WAM RED BLOOD CELL COUNT.: 2.41 M/ÂµL — ABNORMAL LOW (ref 4.00–6.00)
BKR WAM WHITE BLOOD CELL COUNT: 7.2 x1000/ÂµL (ref 4.0–11.0)

## 2024-01-01 NOTE — Telephone Encounter
 Went over labs, platelets going down which is good. Education given. He is taken hydrea . He would lie refills for oxycodone  and oxycontin .

## 2024-01-02 ENCOUNTER — Encounter
Admit: 2024-01-02 | Payer: PRIVATE HEALTH INSURANCE | Attending: Family | Primary: Student in an Organized Health Care Education/Training Program

## 2024-01-02 DIAGNOSIS — D571 Sickle-cell disease without crisis: Principal | ICD-10-CM

## 2024-01-02 MED ORDER — OXYCONTIN 15 MG TABLET,CRUSH RESISTANT,EXTENDED RELEASE
15 | ORAL_TABLET | Freq: Two times a day (BID) | ORAL | 1 refills | 3.00000 days | Status: AC
Start: 2024-01-02 — End: ?

## 2024-01-02 MED ORDER — OXYCODONE IMMEDIATE RELEASE 15 MG TABLET
15 | ORAL_TABLET | ORAL | 1 refills | 3.00000 days | Status: AC | PRN
Start: 2024-01-02 — End: ?

## 2024-01-26 ENCOUNTER — Telehealth
Admit: 2024-01-26 | Payer: PRIVATE HEALTH INSURANCE | Attending: Family | Primary: Student in an Organized Health Care Education/Training Program

## 2024-01-26 ENCOUNTER — Encounter
Admit: 2024-01-26 | Payer: PRIVATE HEALTH INSURANCE | Attending: Family | Primary: Student in an Organized Health Care Education/Training Program

## 2024-01-26 DIAGNOSIS — D571 Sickle-cell disease without crisis: Principal | ICD-10-CM

## 2024-01-26 MED ORDER — OXYCONTIN 15 MG TABLET,CRUSH RESISTANT,EXTENDED RELEASE
15 | ORAL_TABLET | Freq: Two times a day (BID) | ORAL | 1 refills | 3.00000 days | Status: AC
Start: 2024-01-26 — End: ?

## 2024-01-26 MED ORDER — OXYCODONE IMMEDIATE RELEASE 15 MG TABLET
15 | ORAL_TABLET | ORAL | 1 refills | 3.00000 days | Status: AC | PRN
Start: 2024-01-26 — End: ?

## 2024-01-26 MED ORDER — OXYCONTIN 15 MG TABLET,CRUSH RESISTANT,EXTENDED RELEASE
15 | ORAL_TABLET | Freq: Two times a day (BID) | ORAL | 1 refills | 5.00000 days | Status: AC
Start: 2024-01-26 — End: 2024-01-26

## 2024-01-26 MED ORDER — OXYCODONE IMMEDIATE RELEASE 15 MG TABLET
15 | ORAL_TABLET | ORAL | 1 refills | 5.00000 days | Status: AC | PRN
Start: 2024-01-26 — End: 2024-01-26

## 2024-01-26 NOTE — Telephone Encounter
 The patient called to inform that he's having a sickle cell crisis and wants to refill medication for - oxyCODONE  (ROXICODONE ) 15 mg Immediate Release tabletCV[id number] - [place], [place] - [address] [place] [address]Eynwz: [phone] Fax: [phone]

## 2024-01-27 ENCOUNTER — Telehealth
Admit: 2024-01-27 | Payer: PRIVATE HEALTH INSURANCE | Primary: Student in an Organized Health Care Education/Training Program

## 2024-01-27 NOTE — Progress Notes
 Phillip Bell is a 28 y.o. 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 He is followed by Philippe Hoit and is on hydrea  and pain meds of oxycodone  ER 15mg  BID and IR 15mg  prn. He is currently out of pain meds and script sent yesterday by team cannot be filled until Monday he is told.He says he is having worsening low back pain over the past few days and its a 6/10 today. He denies fevers, chills, dyspnea or other systemic signs or symptoms or nidus of pain. He was wondering if it would be possible to be seen in clinic today which I stated most clinics are closed on the weekend. He says he normally goes tot [facility] ED in situations like these for pain control and will go there this morning which I advised would be prudent. Will alert his primary team that he is heading to ED today.

## 2024-02-05 ENCOUNTER — Encounter
Admit: 2024-02-05 | Payer: PRIVATE HEALTH INSURANCE | Primary: Student in an Organized Health Care Education/Training Program

## 2024-02-14 ENCOUNTER — Telehealth
Admit: 2024-02-14 | Payer: PRIVATE HEALTH INSURANCE | Primary: Student in an Organized Health Care Education/Training Program

## 2024-02-14 ENCOUNTER — Ambulatory Visit
Admit: 2024-02-14 | Payer: PRIVATE HEALTH INSURANCE | Attending: Family | Primary: Student in an Organized Health Care Education/Training Program

## 2024-02-14 NOTE — Telephone Encounter
 I called to see if he can reschedule to 02/19/24 at 1:30. He agreed.

## 2024-02-19 ENCOUNTER — Telehealth
Admit: 2024-02-19 | Payer: PRIVATE HEALTH INSURANCE | Attending: Family | Primary: Student in an Organized Health Care Education/Training Program

## 2024-02-19 ENCOUNTER — Ambulatory Visit
Admit: 2024-02-19 | Payer: PRIVATE HEALTH INSURANCE | Attending: Family | Primary: Student in an Organized Health Care Education/Training Program

## 2024-02-19 NOTE — Telephone Encounter
 Patient called in and stated he had a family emergency so he needed to reschedule his appointment this afternoon with Philippe to tomorrow. He was very apologetic but confirmed he will be in tomorrow .

## 2024-02-19 NOTE — Telephone Encounter
 Noted.Message sent to provider for awareness

## 2024-02-20 ENCOUNTER — Encounter
Admit: 2024-02-20 | Payer: PRIVATE HEALTH INSURANCE | Primary: Student in an Organized Health Care Education/Training Program

## 2024-02-20 ENCOUNTER — Ambulatory Visit
Admit: 2024-02-20 | Payer: BLUE CROSS/BLUE SHIELD | Attending: Family | Primary: Student in an Organized Health Care Education/Training Program

## 2024-02-20 ENCOUNTER — Encounter
Admit: 2024-02-20 | Payer: PRIVATE HEALTH INSURANCE | Attending: Family | Primary: Student in an Organized Health Care Education/Training Program

## 2024-02-20 VITALS — BP 124/70 | HR 87 | Temp 97.80000°F | Resp 19 | Ht 74.0 in | Wt 153.0 lb

## 2024-02-20 DIAGNOSIS — K219 Gastro-esophageal reflux disease without esophagitis: Secondary | ICD-10-CM

## 2024-02-20 DIAGNOSIS — D5700 Hb-SS disease with crisis, unspecified: Secondary | ICD-10-CM

## 2024-02-20 DIAGNOSIS — Z23 Encounter for immunization: Secondary | ICD-10-CM

## 2024-02-20 DIAGNOSIS — Z862 Personal history of diseases of the blood and blood-forming organs and certain disorders involving the immune mechanism: Secondary | ICD-10-CM

## 2024-02-20 DIAGNOSIS — D7389 Other diseases of spleen: Secondary | ICD-10-CM

## 2024-02-20 DIAGNOSIS — D571 Sickle-cell disease without crisis: Principal | ICD-10-CM

## 2024-02-20 DIAGNOSIS — M87052 Idiopathic aseptic necrosis of left femur: Secondary | ICD-10-CM

## 2024-02-20 DIAGNOSIS — Z8616 History of COVID-19: Secondary | ICD-10-CM

## 2024-02-20 DIAGNOSIS — Z7964 On hydroxyurea therapy: Secondary | ICD-10-CM

## 2024-02-20 DIAGNOSIS — IMO0002 Dactylitis: Secondary | ICD-10-CM

## 2024-02-20 DIAGNOSIS — D6189 Other specified aplastic anemias and other bone marrow failure syndromes: Secondary | ICD-10-CM

## 2024-02-20 DIAGNOSIS — G8929 Other chronic pain: Secondary | ICD-10-CM

## 2024-02-20 MED ORDER — OXYCODONE IMMEDIATE RELEASE 15 MG TABLET
15 | ORAL_TABLET | ORAL | 1 refills | 5.00000 days | Status: AC | PRN
Start: 2024-02-20 — End: ?

## 2024-02-20 MED ORDER — OXYCONTIN 15 MG TABLET,CRUSH RESISTANT,EXTENDED RELEASE
15 | ORAL_TABLET | Freq: Two times a day (BID) | ORAL | 1 refills | 5.00000 days | Status: AC
Start: 2024-02-20 — End: ?

## 2024-02-20 NOTE — Progress Notes
 SOCIAL WORK NOTEPatient Name: Byard RiceMedical Record Number: [id number] Date of Birth: 11/19/1997Medical Social Work Follow Up  [employer] Most Recent Value Admission Information  Document Type Progress Note Reason for Current Social Work Involvement Support/Coping, Advance Directives/Healthcare Representative Intervention Psycho-social Intervention Source of Information Patient Record Reviewed Yes Level of Care Ambulatory What medium(s) of communication were used with patient/family/caregiver? Face-to-Face / In-Person Psychosocial issues requiring intervention Advance Directives education Psychosocial interventions 15 minutes were spent face to face with Mr. Hanway.  I provided education on what an advance directive is and explained the importance of designating individuals to make medical decisions on his behalf should he become unable to do so.  Mr. Luginbill signed the Health Care Representative form with Philippe Hoit, APRN serving as a witness.  I confirmed his understanding of the form's purpose and addressed any questions he had.  Mr. Kohles was also given a Living Will to review at home.  Mr. Herendeen reported no psychosocial concerns or needs at this time. Mr. Niazi has my contact information and is aware of social work availability. [facility] Handoff Required? No Next Steps/Plan (including hand-off): No further social work intervention is indicated at this time. Please reconsult if additional needs arise. Signature: Arlyne Silvan, LMSW Contact Information: [phone]

## 2024-03-22 ENCOUNTER — Encounter
Admit: 2024-03-22 | Payer: PRIVATE HEALTH INSURANCE | Attending: Family | Primary: Student in an Organized Health Care Education/Training Program

## 2024-03-22 DIAGNOSIS — D571 Sickle-cell disease without crisis: Principal | ICD-10-CM

## 2024-03-22 NOTE — Telephone Encounter
 Pt called into the office requesting a medication refill on oxyCODONE  (ROXICODONE ) 15 mg Immediate Release tablet.[facility] pharmacy [phone] [place] [place] - [address] [place] [address]Eynwz: [phone]  Fax: [phone]

## 2024-03-25 ENCOUNTER — Encounter
Admit: 2024-03-25 | Payer: PRIVATE HEALTH INSURANCE | Attending: Internal Medicine | Primary: Student in an Organized Health Care Education/Training Program

## 2024-03-25 MED ORDER — OXYCONTIN 15 MG TABLET,CRUSH RESISTANT,EXTENDED RELEASE
15 | ORAL_TABLET | Freq: Two times a day (BID) | ORAL | 1 refills | 3.00000 days | Status: AC
Start: 2024-03-25 — End: ?

## 2024-03-25 MED ORDER — OXYCODONE IMMEDIATE RELEASE 15 MG TABLET
15 | ORAL_TABLET | Freq: Four times a day (QID) | ORAL | 1 refills | 3.00000 days | Status: AC | PRN
Start: 2024-03-25 — End: ?

## 2024-04-22 ENCOUNTER — Encounter
Admit: 2024-04-22 | Payer: PRIVATE HEALTH INSURANCE | Attending: Family | Primary: Student in an Organized Health Care Education/Training Program

## 2024-04-22 ENCOUNTER — Telehealth
Admit: 2024-04-22 | Payer: PRIVATE HEALTH INSURANCE | Attending: Pediatric Hematology-Oncology | Primary: Student in an Organized Health Care Education/Training Program

## 2024-04-22 ENCOUNTER — Telehealth
Admit: 2024-04-22 | Payer: PRIVATE HEALTH INSURANCE | Attending: Family | Primary: Student in an Organized Health Care Education/Training Program

## 2024-04-22 MED ORDER — OXYCODONE IMMEDIATE RELEASE 15 MG TABLET
15 | ORAL_TABLET | Freq: Four times a day (QID) | ORAL | 1 refills | 5.00000 days | Status: AC | PRN
Start: 2024-04-22 — End: 2024-04-23

## 2024-04-22 MED ORDER — OXYCONTIN 15 MG TABLET,CRUSH RESISTANT,EXTENDED RELEASE
15 | ORAL_TABLET | Freq: Two times a day (BID) | ORAL | 1 refills | 5.00000 days | Status: AC
Start: 2024-04-22 — End: 2024-04-23

## 2024-04-22 NOTE — Telephone Encounter
 Hi,Patient called to request refills for oxyCODONE  (ROXICODONE ) 15 mg Immediate Release tablet + Hydroxyurea  500mg  to [facility] to [id number] [place] [place] - [address]

## 2024-04-23 ENCOUNTER — Encounter
Admit: 2024-04-23 | Payer: PRIVATE HEALTH INSURANCE | Attending: Family | Primary: Student in an Organized Health Care Education/Training Program

## 2024-04-23 MED ORDER — OXYCODONE IMMEDIATE RELEASE 15 MG TABLET
15 | ORAL_TABLET | Freq: Four times a day (QID) | ORAL | 1 refills | 3.50000 days | Status: AC | PRN
Start: 2024-04-23 — End: ?

## 2024-04-23 MED ORDER — OXYCONTIN 15 MG TABLET,CRUSH RESISTANT,EXTENDED RELEASE
15 | ORAL_TABLET | Freq: Two times a day (BID) | ORAL | 1 refills | 3.50000 days | Status: AC
Start: 2024-04-23 — End: 2024-04-23

## 2024-04-23 MED ORDER — OXYCODONE IMMEDIATE RELEASE 15 MG TABLET
15 | ORAL_TABLET | Freq: Four times a day (QID) | ORAL | 1 refills | 3.50000 days | Status: AC | PRN
Start: 2024-04-23 — End: 2024-04-23

## 2024-04-23 MED ORDER — OXYCONTIN 15 MG TABLET,CRUSH RESISTANT,EXTENDED RELEASE
15 | ORAL_TABLET | Freq: Two times a day (BID) | ORAL | 1 refills | 3.00000 days | Status: AC
Start: 2024-04-23 — End: 2024-04-25

## 2024-04-23 NOTE — Telephone Encounter
 Patient called into the office in regards to his medication oxyCODONE  (ROXICODONE ) 15 mg Immediate Release tablet.Patient stated his pharmacy reached out to him and let him know they are unable to refill his prescription as a diagnoses code is needed.[id number] [place] [place] - [address] [place] [address]E[place]: [phone]  Fax: [phone]

## 2024-04-23 NOTE — Telephone Encounter
 Patient is calling in because the pharmacy is still saying that there is no DX code for the Immediate Release rx. Pt is asking for the provider or a nurse to call the pharmacy to fix this issue and then call him and advise once its done.

## 2024-04-25 ENCOUNTER — Encounter
Admit: 2024-04-25 | Payer: PRIVATE HEALTH INSURANCE | Primary: Student in an Organized Health Care Education/Training Program

## 2024-04-25 DIAGNOSIS — D571 Sickle-cell disease without crisis: Principal | ICD-10-CM

## 2024-04-25 MED ORDER — OXYCODONE IMMEDIATE RELEASE 15 MG TABLET
15 | ORAL_TABLET | ORAL | 1 refills | 3.00000 days | Status: AC | PRN
Start: 2024-04-25 — End: ?

## 2024-04-25 MED ORDER — OXYCONTIN 15 MG TABLET,CRUSH RESISTANT,EXTENDED RELEASE
15 | ORAL_TABLET | Freq: Two times a day (BID) | ORAL | 1 refills | 3.00000 days | Status: AC
Start: 2024-04-25 — End: ?

## 2024-05-11 ENCOUNTER — Encounter
Admit: 2024-05-11 | Payer: PRIVATE HEALTH INSURANCE | Primary: Student in an Organized Health Care Education/Training Program

## 2024-05-20 ENCOUNTER — Inpatient Hospital Stay
Admit: 2024-05-20 | Discharge: 2024-05-20 | Payer: BLUE CROSS/BLUE SHIELD | Primary: Student in an Organized Health Care Education/Training Program

## 2024-05-20 ENCOUNTER — Ambulatory Visit
Admit: 2024-05-20 | Payer: PRIVATE HEALTH INSURANCE | Attending: Family | Primary: Student in an Organized Health Care Education/Training Program

## 2024-05-20 VITALS — BP 122/71 | HR 70 | Temp 98.00000°F | Resp 18 | Ht 74.0 in | Wt 155.9 lb

## 2024-05-20 DIAGNOSIS — Z0000 Encounter for general adult medical examination without abnormal findings: Secondary | ICD-10-CM

## 2024-05-20 DIAGNOSIS — D5700 Hb-SS disease with crisis, unspecified: Principal | ICD-10-CM

## 2024-05-20 LAB — CBC WITH AUTO DIFFERENTIAL
BKR WAM ABSOLUTE IMMATURE GRANULOCYTES.: 0.02 x 1000/ÂµL (ref 0.00–0.30)
BKR WAM ABSOLUTE LYMPHOCYTE COUNT.: 2.04 x 1000/ÂµL (ref 0.60–3.70)
BKR WAM ABSOLUTE NRBC: 0.61 x 1000/ÂµL (ref 0.00–1.00)
BKR WAM ANC (ABSOLUTE NEUTROPHIL COUNT): 3.13 x 1000/ÂµL (ref 2.00–7.60)
BKR WAM BASOPHIL ABSOLUTE COUNT.: 0.06 x 1000/ÂµL (ref 0.00–1.00)
BKR WAM BASOPHILS: 1 % (ref 0.0–1.4)
BKR WAM EOSINOPHIL ABSOLUTE COUNT.: 0.14 x 1000/ÂµL (ref 0.00–1.00)
BKR WAM EOSINOPHILS: 2.3 % (ref 0.0–5.0)
BKR WAM HEMATOCRIT: 21.7 % — ABNORMAL LOW (ref 38.50–50.00)
BKR WAM HEMOGLOBIN: 7.9 g/dL — ABNORMAL LOW (ref 13.2–17.1)
BKR WAM IMMATURE GRANULOCYTES: 0.3 % (ref 0.0–1.0)
BKR WAM LYMPHOCYTES: 33.2 % (ref 17.0–50.0)
BKR WAM MCH: 37.3 pg — ABNORMAL HIGH (ref 27.0–33.0)
BKR WAM MCHC: 36.4 g/dL — ABNORMAL HIGH (ref 31.0–36.0)
BKR WAM MCV: 102.4 fL — ABNORMAL HIGH (ref 80.0–100.0)
BKR WAM MONOCYTE ABSOLUTE COUNT.: 0.75 x 1000/ÂµL (ref 0.00–1.00)
BKR WAM MONOCYTES: 12.2 % — ABNORMAL HIGH (ref 4.0–12.0)
BKR WAM MPV: 9.2 fL (ref 8.0–12.0)
BKR WAM NEUTROPHILS: 51 % (ref 39.0–72.0)
BKR WAM NUCLEATED RED BLOOD CELLS: 9.9 % — ABNORMAL HIGH (ref 0.0–1.0)
BKR WAM PLATELETS: 397 x1000/ÂµL (ref 150–420)
BKR WAM RDW-CV: 17.9 % — ABNORMAL HIGH (ref 11.0–15.0)
BKR WAM RED BLOOD CELL COUNT.: 2.12 M/ÂµL — ABNORMAL LOW (ref 4.00–6.00)
BKR WAM WHITE BLOOD CELL COUNT: 6.1 x1000/ÂµL (ref 4.0–11.0)

## 2024-05-20 LAB — HEPATIC FUNCTION PANEL
BKR A/G RATIO: 1.6 (ref 1.0–2.2)
BKR ALANINE AMINOTRANSFERASE (ALT): 41 U/L (ref 9–59)
BKR ALBUMIN: 4.7 g/dL (ref 3.6–5.1)
BKR ALKALINE PHOSPHATASE: 97 U/L (ref 9–122)
BKR ASPARTATE AMINOTRANSFERASE (AST): 56 U/L — ABNORMAL HIGH (ref 10–35)
BKR AST/ALT RATIO: 1.4
BKR BILIRUBIN DIRECT: 0.7 mg/dL — ABNORMAL HIGH (ref <=0.2)
BKR BILIRUBIN TOTAL: 2.6 mg/dL — ABNORMAL HIGH (ref <=1.2)
BKR GLOBULIN: 2.9 g/dL (ref 2.0–3.9)
BKR PROTEIN TOTAL: 7.6 g/dL (ref 5.9–8.3)

## 2024-05-20 LAB — BASIC METABOLIC PANEL
BKR ANION GAP: 12 (ref 7–17)
BKR BLOOD UREA NITROGEN: 9 mg/dL (ref 6–20)
BKR BUN / CREAT RATIO: 12.2 (ref 8.0–23.0)
BKR CALCIUM: 9.5 mg/dL (ref 8.8–10.2)
BKR CHLORIDE: 103 mmol/L (ref 98–107)
BKR CO2: 24 mmol/L (ref 20–30)
BKR CREATININE DELTA: -0.06
BKR CREATININE: 0.74 mg/dL (ref 0.40–1.30)
BKR EGFR, CREATININE (CKD-EPI 2021): >60 mL/min/1.73m2 (ref >=60)
BKR GLUCOSE: 99 mg/dL (ref 70–100)
BKR POTASSIUM: 4.8 mmol/L (ref 3.3–5.3)
BKR SODIUM: 139 mmol/L (ref 136–144)

## 2024-05-20 LAB — RETICULOCYTES
BKR WAM IRF: 35.4 % — ABNORMAL HIGH (ref 3.0–15.9)
BKR WAM RETICULOCYTE - ABS (3 DEC): 0.183 10Ë6 cells/uL — ABNORMAL HIGH (ref 0.023–0.140)
BKR WAM RETICULOCYTE COUNT PCT (1 DEC): 8.6 % — ABNORMAL HIGH (ref 0.6–2.7)
BKR WAM RETICULOCYTE HGB EQUIVALENT: 37.7 pg — ABNORMAL HIGH (ref 28.2–35.7)

## 2024-05-20 LAB — PROTEIN, TOTAL W/CREATININE, URINE, RANDOM     (BH GH LMW YH)
BKR CREATININE, URINE, RANDOM: 14 mg/dL
BKR PROTEIN URINE RANDOM: <0.04 g/L

## 2024-05-20 MED ORDER — HYDROXYUREA 500 MG CAPSULE
500 | ORAL_CAPSULE | Freq: Every day | ORAL | 2 refills | 30.00000 days | Status: AC
Start: 2024-05-20 — End: ?

## 2024-05-20 MED ORDER — PNEUMOCOCCAL 21-VALENT CONJ VACCINE-DIP CRM (PF) 0.5 ML IM SYRINGE
0.5 | INTRAMUSCULAR | Status: AC
Start: 2024-05-20 — End: ?

## 2024-05-27 ENCOUNTER — Telehealth
Admit: 2024-05-27 | Payer: PRIVATE HEALTH INSURANCE | Attending: Family | Primary: Student in an Organized Health Care Education/Training Program

## 2024-05-27 ENCOUNTER — Encounter
Admit: 2024-05-27 | Payer: PRIVATE HEALTH INSURANCE | Attending: Family | Primary: Student in an Organized Health Care Education/Training Program

## 2024-05-27 DIAGNOSIS — D571 Sickle-cell disease without crisis: Principal | ICD-10-CM

## 2024-05-27 MED ORDER — IBUPROFEN 600 MG TABLET
600 | ORAL_TABLET | 2 refills | 15.00000 days | Status: AC
Start: 2024-05-27 — End: ?

## 2024-05-27 MED ORDER — OXYCODONE IMMEDIATE RELEASE 15 MG TABLET
15 | ORAL_TABLET | ORAL | 1 refills | 3.00000 days | Status: AC | PRN
Start: 2024-05-27 — End: ?

## 2024-05-27 MED ORDER — OXYCONTIN 15 MG TABLET,CRUSH RESISTANT,EXTENDED RELEASE
15 | ORAL_TABLET | Freq: Two times a day (BID) | ORAL | 1 refills | 3.00000 days | Status: AC
Start: 2024-05-27 — End: ?

## 2024-05-27 NOTE — Telephone Encounter [36]
 Patient just came back from [place] and he is having a crisis when I asked [facility].oxyCODONE  (ROXICODONE ) 15 mg Immediate Release tablet	90 tabletSig - Route: Take 1 tablet (15 mg total) by mouth every 6 (six) hours as needed for pain.OXYCONTIN  15 mg 12 hr extended release tablet	60 tablet	Sig - Route: Take 1 tablet (15 mg total) by mouth every 12 (twelve) hours. - Ora	ibuprofen  (ADVIL ,MOTRIN ) 600 mg tablet	100 tablet	1Sig: TAKE ONE TABLET EVERY 6 HOURS (UP TO 4 TABS A DAY) DAILY AS NEEDED FOR PAINPreferred PharmacyPharmacy SearchCVS/pharmacy [phone] - [place], [place] - [address] [place] [address]E[place]wz: [phone] Fax: [phone]

## 2024-05-29 NOTE — Progress Notes [1]
 Provider recommended pneumococcal vaccine. Risk and benefits were discussed with patient and all questions and concerns were addressed.  Patient declined vaccineJENNIFER AFRANIE-SAKYI, MDClinical Fellow, Hematology/OncologyMobile Heartbeat: [phone].

## 2024-06-12 ENCOUNTER — Encounter
Admit: 2024-06-12 | Payer: PRIVATE HEALTH INSURANCE | Attending: Vascular and Interventional Radiology | Primary: Student in an Organized Health Care Education/Training Program

## 2024-06-20 ENCOUNTER — Encounter
Admit: 2024-06-20 | Payer: PRIVATE HEALTH INSURANCE | Attending: Family | Primary: Student in an Organized Health Care Education/Training Program

## 2024-06-20 DIAGNOSIS — D571 Sickle-cell disease without crisis: Principal | ICD-10-CM

## 2024-06-20 NOTE — Telephone Encounter [36]
 Patient requesting refill of : oxyCODONE  (ROXICODONE ) 15 mg Immediate Release tabletPlease send to : [id number] [place] [place] - [address]

## 2024-06-21 ENCOUNTER — Encounter
Admit: 2024-06-21 | Payer: PRIVATE HEALTH INSURANCE | Attending: Family | Primary: Student in an Organized Health Care Education/Training Program

## 2024-06-21 DIAGNOSIS — D571 Sickle-cell disease without crisis: Principal | ICD-10-CM

## 2024-06-21 MED ORDER — OXYCONTIN 15 MG TABLET,CRUSH RESISTANT,EXTENDED RELEASE
15 | ORAL_TABLET | Freq: Two times a day (BID) | ORAL | 1 refills | 4.00000 days | Status: AC
Start: 2024-06-21 — End: ?

## 2024-06-21 MED ORDER — OXYCODONE IMMEDIATE RELEASE 15 MG TABLET
15 | ORAL_TABLET | ORAL | 1 refills | 4.00000 days | Status: AC | PRN
Start: 2024-06-21 — End: ?

## 2024-07-18 ENCOUNTER — Telehealth
Admit: 2024-07-18 | Payer: PRIVATE HEALTH INSURANCE | Attending: Family | Primary: Student in an Organized Health Care Education/Training Program

## 2024-07-18 ENCOUNTER — Encounter
Admit: 2024-07-18 | Payer: PRIVATE HEALTH INSURANCE | Attending: Family | Primary: Student in an Organized Health Care Education/Training Program

## 2024-07-18 DIAGNOSIS — D571 Sickle-cell disease without crisis: Principal | ICD-10-CM

## 2024-07-18 MED ORDER — OXYCODONE IMMEDIATE RELEASE 15 MG TABLET
15 | ORAL_TABLET | ORAL | 1 refills | 3.00000 days | Status: AC | PRN
Start: 2024-07-18 — End: ?

## 2024-07-18 MED ORDER — OXYCONTIN 15 MG TABLET,CRUSH RESISTANT,EXTENDED RELEASE
15 | ORAL_TABLET | Freq: Two times a day (BID) | ORAL | 1 refills | 3.00000 days | Status: AC
Start: 2024-07-18 — End: ?

## 2024-07-18 NOTE — Telephone Encounter [36]
 Patient requests refills on oxyCODONE  (ROXICODONE ) 15 mg Immediate Release tablet and OXYCONTIN  15 mg 12 hr extended release [facility] [address] [facility] [place]

## 2024-09-18 ENCOUNTER — Ambulatory Visit
Admit: 2024-09-18 | Payer: PRIVATE HEALTH INSURANCE | Attending: Family | Primary: Student in an Organized Health Care Education/Training Program

## 2024-11-13 ENCOUNTER — Encounter
Admit: 2024-11-13 | Payer: PRIVATE HEALTH INSURANCE | Attending: Student in an Organized Health Care Education/Training Program | Primary: Student in an Organized Health Care Education/Training Program
